# Patient Record
Sex: Female | Born: 1950
Health system: Southern US, Community
[De-identification: ages and names within clinical notes are randomized; demographics above are authoritative.]

## PROBLEM LIST (undated history)

## (undated) DIAGNOSIS — F22 Delusional disorders: Secondary | ICD-10-CM

## (undated) DIAGNOSIS — R112 Nausea with vomiting, unspecified: Secondary | ICD-10-CM

## (undated) DIAGNOSIS — T8859XA Other complications of anesthesia, initial encounter: Secondary | ICD-10-CM

## (undated) DIAGNOSIS — K219 Gastro-esophageal reflux disease without esophagitis: Secondary | ICD-10-CM

## (undated) DIAGNOSIS — R7303 Prediabetes: Secondary | ICD-10-CM

## (undated) DIAGNOSIS — E669 Obesity, unspecified: Secondary | ICD-10-CM

## (undated) DIAGNOSIS — C50919 Malignant neoplasm of unspecified site of unspecified female breast: Secondary | ICD-10-CM

## (undated) DIAGNOSIS — Z86718 Personal history of other venous thrombosis and embolism: Secondary | ICD-10-CM

## (undated) DIAGNOSIS — N200 Calculus of kidney: Secondary | ICD-10-CM

## (undated) DIAGNOSIS — I82409 Acute embolism and thrombosis of unspecified deep veins of unspecified lower extremity: Secondary | ICD-10-CM

## (undated) DIAGNOSIS — E079 Disorder of thyroid, unspecified: Secondary | ICD-10-CM

## (undated) DIAGNOSIS — T4145XA Adverse effect of unspecified anesthetic, initial encounter: Secondary | ICD-10-CM

## (undated) DIAGNOSIS — I10 Essential (primary) hypertension: Secondary | ICD-10-CM

## (undated) DIAGNOSIS — Z9889 Other specified postprocedural states: Secondary | ICD-10-CM

## (undated) DIAGNOSIS — E039 Hypothyroidism, unspecified: Secondary | ICD-10-CM

## (undated) DIAGNOSIS — Z923 Personal history of irradiation: Secondary | ICD-10-CM

## (undated) DIAGNOSIS — Z87442 Personal history of urinary calculi: Secondary | ICD-10-CM

## (undated) DIAGNOSIS — E7800 Pure hypercholesterolemia, unspecified: Secondary | ICD-10-CM

## (undated) HISTORY — PX: IVC FILTER INSERTION: CATH118245

## (undated) HISTORY — PX: COLONOSCOPY: SHX174

## (undated) HISTORY — DX: Personal history of other venous thrombosis and embolism: Z86.718

## (undated) HISTORY — PX: ABDOMINAL HYSTERECTOMY: SHX81

## (undated) HISTORY — PX: APPENDECTOMY: SHX54

## (undated) HISTORY — PX: TONSILLECTOMY: SUR1361

---

## 1988-11-24 HISTORY — PX: BREAST CYST ASPIRATION: SHX578

## 2004-11-21 ENCOUNTER — Ambulatory Visit: Payer: Self-pay | Admitting: Internal Medicine

## 2004-11-28 ENCOUNTER — Ambulatory Visit: Payer: Self-pay | Admitting: Internal Medicine

## 2006-02-12 ENCOUNTER — Ambulatory Visit: Payer: Self-pay | Admitting: Internal Medicine

## 2006-07-20 ENCOUNTER — Ambulatory Visit: Payer: Self-pay | Admitting: Gynecology

## 2007-03-04 ENCOUNTER — Ambulatory Visit: Payer: Self-pay | Admitting: Internal Medicine

## 2007-03-05 ENCOUNTER — Ambulatory Visit: Payer: Self-pay | Admitting: Internal Medicine

## 2007-06-09 ENCOUNTER — Ambulatory Visit: Payer: Self-pay | Admitting: Unknown Physician Specialty

## 2007-06-17 ENCOUNTER — Ambulatory Visit: Payer: Self-pay | Admitting: Unknown Physician Specialty

## 2007-09-07 ENCOUNTER — Ambulatory Visit: Payer: Self-pay | Admitting: Internal Medicine

## 2007-10-27 ENCOUNTER — Ambulatory Visit: Payer: Self-pay | Admitting: Unknown Physician Specialty

## 2007-11-02 ENCOUNTER — Inpatient Hospital Stay: Payer: Self-pay | Admitting: Unknown Physician Specialty

## 2008-03-06 ENCOUNTER — Ambulatory Visit: Payer: Self-pay | Admitting: Internal Medicine

## 2008-10-02 ENCOUNTER — Ambulatory Visit: Payer: Self-pay | Admitting: Unknown Physician Specialty

## 2008-10-10 ENCOUNTER — Ambulatory Visit: Payer: Self-pay | Admitting: Unknown Physician Specialty

## 2009-03-07 ENCOUNTER — Ambulatory Visit: Payer: Self-pay | Admitting: Internal Medicine

## 2009-12-25 ENCOUNTER — Ambulatory Visit: Payer: Self-pay | Admitting: Urology

## 2010-03-08 ENCOUNTER — Ambulatory Visit: Payer: Self-pay | Admitting: Internal Medicine

## 2010-11-20 ENCOUNTER — Ambulatory Visit: Payer: Self-pay | Admitting: Urology

## 2011-04-09 ENCOUNTER — Ambulatory Visit: Payer: Self-pay | Admitting: Internal Medicine

## 2011-09-03 ENCOUNTER — Ambulatory Visit: Payer: Self-pay | Admitting: Urology

## 2012-03-08 ENCOUNTER — Ambulatory Visit: Payer: Self-pay | Admitting: Urology

## 2012-03-30 ENCOUNTER — Ambulatory Visit: Payer: Self-pay | Admitting: Orthopedic Surgery

## 2012-11-01 ENCOUNTER — Ambulatory Visit: Payer: Self-pay | Admitting: Internal Medicine

## 2012-11-04 ENCOUNTER — Ambulatory Visit: Payer: Self-pay | Admitting: Unknown Physician Specialty

## 2013-11-02 ENCOUNTER — Ambulatory Visit: Payer: Self-pay | Admitting: Internal Medicine

## 2015-03-18 NOTE — Op Note (Signed)
PATIENT NAME:  Susan, Myers MR#:  [id number] DATE OF BIRTH:  Apr 25, 1951  DATE OF PROCEDURE:  03/30/2012  PREOPERATIVE DIAGNOSIS: Left thumb CMC osteoarthritis.   POSTOPERATIVE DIAGNOSIS: Left thumb CMC osteoarthritis.   PROCEDURE: CMC arthroplasty, left thumb, with interposition tendon arthroplasty.   ANESTHESIA: General.   SURGEON: Laurene Footman, MD    DESCRIPTION OF PROCEDURE: The patient was brought to the operating room and after adequate anesthesia was obtained, the left arm was prepped and draped in the usual sterile fashion with a tourniquet applied to the upper arm. After patient identification and time-out procedures were completed, the arm was exsanguinated with an Esmarch and the tourniquet raised to 250 mmHg. Curvilinear incision was made at the base of the thumb in between the palmar and dorsal skin. The subcutaneous tissues were spread preserving cutaneous nerves. The [facility] joint was identified and incised. There was a large gush of fluid. There was synovitis present. After elevating the capsule for subsequent repair, a small saw was used to split the trapezoid into and the trapezoid was removed in fragments with significant osteoarthritis noted to both the base of the thumb and the trapezoid. The mini C-arm was used to assess and make sure that the entire trapezoid was removed. After completing this, drill hole was made in the base of the first metacarpal into the joint on the volar surface for subsequent ligament reconstruction. An incision was made approximately three inches proximal to the wrist and the FCR tendon was identified and cut and released. It was pulled up through the wound distally in the volar aspect of the wrist and split with one-half being rolled up as an anchovy interposition graft and placed deep into the space where the trapezium had been. The second arm of the tendon was placed through the bony tunnel and with the finger in an abducted position sutured to  itself twice to help reconstruct the ligament stability to prevent subluxation. The capsule was then repaired over the interposed tendon and the abductor tendon was shortened slightly to increase the abductor pole. The wound was then thoroughly irrigated and closed with 5-0 nylon in a simple interrupted fashion. 20 mL of 0.5% Sensorcaine without epinephrine were infiltrated. Sterile dressing of Xeroform, 4 x 4's, Webril, and a radial gutter splint were applied. The patient was sent to the recovery room in stable condition after letting the tourniquet down. Tourniquet time was 82 minutes at 250 mmHg.   SPECIMEN: Removed bone of the trapezium.    ____________________________ Laurene Footman, MD mjm:drc D: 03/30/2012 19:22:11 ET T: 03/31/2012 11:01:48 ET JOB#: [id number]  cc: Laurene Footman, MD, <Dictator> Laurene Footman MD ELECTRONICALLY SIGNED 03/31/2012 12:08

## 2015-04-06 ENCOUNTER — Ambulatory Visit
Admission: RE | Admit: 2015-04-06 | Discharge: 2015-04-06 | Disposition: A | Discharge status: Home / Self Care | Payer: No Typology Code available for payment source | Source: Ambulatory Visit | Attending: Internal Medicine | Admitting: Internal Medicine

## 2015-04-06 ENCOUNTER — Other Ambulatory Visit: Payer: Self-pay | Admitting: Internal Medicine

## 2015-04-06 DIAGNOSIS — Z1231 Encounter for screening mammogram for malignant neoplasm of breast: Secondary | ICD-10-CM | POA: Diagnosis present

## 2015-04-10 ENCOUNTER — Other Ambulatory Visit: Payer: Self-pay | Admitting: Internal Medicine

## 2015-04-10 DIAGNOSIS — N63 Unspecified lump in unspecified breast: Secondary | ICD-10-CM

## 2015-04-10 DIAGNOSIS — R928 Other abnormal and inconclusive findings on diagnostic imaging of breast: Secondary | ICD-10-CM

## 2015-04-11 ENCOUNTER — Ambulatory Visit
Admission: RE | Admit: 2015-04-11 | Discharge: 2015-04-11 | Disposition: A | Discharge status: Home / Self Care | Payer: No Typology Code available for payment source | Source: Ambulatory Visit | Attending: Internal Medicine | Admitting: Internal Medicine

## 2015-04-11 DIAGNOSIS — N63 Unspecified lump in unspecified breast: Secondary | ICD-10-CM

## 2015-04-11 DIAGNOSIS — R928 Other abnormal and inconclusive findings on diagnostic imaging of breast: Secondary | ICD-10-CM

## 2015-10-09 ENCOUNTER — Other Ambulatory Visit: Payer: Self-pay | Admitting: Internal Medicine

## 2015-10-09 DIAGNOSIS — R928 Other abnormal and inconclusive findings on diagnostic imaging of breast: Secondary | ICD-10-CM

## 2015-10-23 ENCOUNTER — Ambulatory Visit: Admission: RE | Admit: 2015-10-23 | Payer: No Typology Code available for payment source | Source: Ambulatory Visit

## 2015-11-23 ENCOUNTER — Encounter: Payer: Self-pay | Admitting: Emergency Medicine

## 2015-11-23 ENCOUNTER — Emergency Department
Admission: EM | Admit: 2015-11-23 | Discharge: 2015-11-24 | Disposition: A | Discharge status: Other Acute Inpatient Hospital | Payer: PPO | Attending: Emergency Medicine | Admitting: Emergency Medicine

## 2015-11-23 DIAGNOSIS — F29 Unspecified psychosis not due to a substance or known physiological condition: Secondary | ICD-10-CM | POA: Insufficient documentation

## 2015-11-23 DIAGNOSIS — F22 Delusional disorders: Secondary | ICD-10-CM | POA: Diagnosis not present

## 2015-11-23 DIAGNOSIS — Z880 Allergy status to penicillin: Secondary | ICD-10-CM | POA: Diagnosis not present

## 2015-11-23 DIAGNOSIS — E039 Hypothyroidism, unspecified: Secondary | ICD-10-CM

## 2015-11-23 DIAGNOSIS — Z046 Encounter for general psychiatric examination, requested by authority: Secondary | ICD-10-CM | POA: Diagnosis present

## 2015-11-23 HISTORY — DX: Calculus of kidney: N20.0

## 2015-11-23 HISTORY — DX: Disorder of thyroid, unspecified: E07.9

## 2015-11-23 LAB — COMPREHENSIVE METABOLIC PANEL
ALBUMIN: 4.8 g/dL (ref 3.5–5.0)
ALK PHOS: 88 U/L (ref 38–126)
ALT: 24 U/L (ref 14–54)
ANION GAP: 10 (ref 5–15)
AST: 22 U/L (ref 15–41)
BUN: 20 mg/dL (ref 6–20)
CO2: 28 mmol/L (ref 22–32)
Calcium: 9.7 mg/dL (ref 8.9–10.3)
Chloride: 105 mmol/L (ref 101–111)
Creatinine, Ser: 0.92 mg/dL (ref 0.44–1.00)
GFR calc Af Amer: >60 mL/min (ref >60)
GFR calc non Af Amer: >60 mL/min (ref >60)
GLUCOSE: 101 mg/dL — AB (ref 65–99)
POTASSIUM: 3.4 mmol/L — AB (ref 3.5–5.1)
SODIUM: 143 mmol/L (ref 135–145)
Total Bilirubin: 0.8 mg/dL (ref 0.3–1.2)
Total Protein: 7.5 g/dL (ref 6.5–8.1)

## 2015-11-23 LAB — CBC
HCT: 44.4 % (ref 35.0–47.0)
Hemoglobin: 14.6 g/dL (ref 12.0–16.0)
MCH: 30.7 pg (ref 26.0–34.0)
MCHC: 32.9 g/dL (ref 32.0–36.0)
MCV: 93.2 fL (ref 80.0–100.0)
PLATELETS: 214 10*3/uL (ref 150–440)
RBC: 4.77 MIL/uL (ref 3.80–5.20)
RDW: 13.8 % (ref 11.5–14.5)
WBC: 12.3 10*3/uL — ABNORMAL HIGH (ref 3.6–11.0)

## 2015-11-23 LAB — URINE DRUG SCREEN, QUALITATIVE (ARMC ONLY)
Amphetamines, Ur Screen: NOT DETECTED
BARBITURATES, UR SCREEN: NOT DETECTED
Benzodiazepine, Ur Scrn: NOT DETECTED
CANNABINOID 50 NG, UR ~~LOC~~: NOT DETECTED
COCAINE METABOLITE, UR ~~LOC~~: NOT DETECTED
MDMA (ECSTASY) UR SCREEN: NOT DETECTED
Methadone Scn, Ur: NOT DETECTED
Opiate, Ur Screen: NOT DETECTED
PHENCYCLIDINE (PCP) UR S: NOT DETECTED
TRICYCLIC, UR SCREEN: NOT DETECTED

## 2015-11-23 LAB — SALICYLATE LEVEL: Salicylate Lvl: <4 mg/dL (ref 2.8–30.0)

## 2015-11-23 LAB — ETHANOL: Alcohol, Ethyl (B): <5 mg/dL (ref <5)

## 2015-11-23 LAB — ACETAMINOPHEN LEVEL

## 2015-11-23 NOTE — ED Notes (Signed)
Report received from Luis RN

## 2015-11-23 NOTE — ED Notes (Addendum)
Patient to ED-BHU from ED ambulatory without difficulty to room #3.  Patient is alert and oriented, calm and cooperative, and in no apparent distress at the current time.  Patient denies any pain currently.  Patient denies suicidal ideation, homicidal ideation, auditory or visual hallucinations at the present time.  Patient is oriented to the unit.  Patient is made aware of security cameras and q.15 minute safety checks.  Patient is encouraged to notify staff with any questions or concerns.
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
## 2015-11-23 NOTE — ED Notes (Signed)
ED BHU [place] Is the patient under IVC or is there intent for IVC: Yes.   Is the patient medically cleared: Yes.   Is there vacancy in the ED BHU: Yes.   Is the population mix appropriate for patient: Yes.   Is the patient awaiting placement in inpatient or outpatient setting: Yes.   Has the patient had a psychiatric consult: Yes.   Survey of unit performed for contraband, proper placement and condition of furniture, tampering with fixtures in bathroom, shower, and each patient room: Yes.  ; Findings: All clear. APPEARANCE/BEHAVIOR calm, cooperative and adequate rapport can be established NEURO ASSESSMENT Orientation: time, place and person Hallucinations: No.None noted (Hallucinations) Speech: Normal Gait: normal RESPIRATORY ASSESSMENT Within normal limits. CARDIOVASCULAR ASSESSMENT Within normal limits GASTROINTESTINAL ASSESSMENT Within normal limits EXTREMITIES ROM of all joints is normal PLAN OF CARE Provide calm/safe environment. Vital signs assessed twice daily. ED BHU Assessment once each 12-hour shift. Collaborate with intake RN daily or as condition indicates. Assure the ED provider has rounded once each shift. Provide and encourage hygiene. Provide redirection as needed. Assess for escalating behavior; address immediately and inform ED provider.  Assess family dynamic and appropriateness for visitation as needed: Yes.  ; If necessary, describe findings:  Educate the patient/family about BHU procedures/visitation: Yes.  ; If necessary, describe findings: Patient calm and cooperative at this time, understanding of BHU rules and procedures.  Will continue to monitor.

## 2015-11-23 NOTE — ED Notes (Signed)
Hand off report given to Jen, RN

## 2015-11-24 ENCOUNTER — Encounter: Payer: Self-pay | Admitting: General Practice

## 2015-11-24 ENCOUNTER — Inpatient Hospital Stay
Admission: EM | Admit: 2015-11-24 | Discharge: 2015-11-29 | DRG: 885 | Disposition: A | Discharge status: Home / Self Care | Payer: PPO | Source: Intra-hospital | Attending: Psychiatry | Admitting: Psychiatry

## 2015-11-24 DIAGNOSIS — R45851 Suicidal ideations: Secondary | ICD-10-CM | POA: Diagnosis present

## 2015-11-24 DIAGNOSIS — G4700 Insomnia, unspecified: Secondary | ICD-10-CM | POA: Diagnosis present

## 2015-11-24 DIAGNOSIS — F22 Delusional disorders: Principal | ICD-10-CM | POA: Diagnosis present

## 2015-11-24 DIAGNOSIS — F29 Unspecified psychosis not due to a substance or known physiological condition: Secondary | ICD-10-CM | POA: Diagnosis not present

## 2015-11-24 DIAGNOSIS — R609 Edema, unspecified: Secondary | ICD-10-CM | POA: Diagnosis present

## 2015-11-24 DIAGNOSIS — Z882 Allergy status to sulfonamides status: Secondary | ICD-10-CM

## 2015-11-24 DIAGNOSIS — E039 Hypothyroidism, unspecified: Secondary | ICD-10-CM | POA: Diagnosis present

## 2015-11-24 DIAGNOSIS — N390 Urinary tract infection, site not specified: Secondary | ICD-10-CM | POA: Diagnosis present

## 2015-11-24 DIAGNOSIS — Z888 Allergy status to other drugs, medicaments and biological substances status: Secondary | ICD-10-CM

## 2015-11-24 DIAGNOSIS — Z87442 Personal history of urinary calculi: Secondary | ICD-10-CM | POA: Diagnosis not present

## 2015-11-24 DIAGNOSIS — Z880 Allergy status to penicillin: Secondary | ICD-10-CM | POA: Diagnosis not present

## 2015-11-24 DIAGNOSIS — I679 Cerebrovascular disease, unspecified: Secondary | ICD-10-CM

## 2015-11-24 DIAGNOSIS — E7800 Pure hypercholesterolemia, unspecified: Secondary | ICD-10-CM

## 2015-11-24 DIAGNOSIS — I10 Essential (primary) hypertension: Secondary | ICD-10-CM

## 2015-11-24 LAB — LIPID PANEL
Cholesterol: 204 mg/dL — ABNORMAL HIGH (ref 0–200)
HDL: 74 mg/dL (ref >40)
LDL CALC: 113 mg/dL — AB (ref 0–99)
Total CHOL/HDL Ratio: 2.8 RATIO
Triglycerides: 83 mg/dL (ref <150)
VLDL: 17 mg/dL (ref 0–40)

## 2015-11-24 LAB — TSH: TSH: 4.835 u[IU]/mL — AB (ref 0.350–4.500)

## 2015-11-24 MED ORDER — ARIPIPRAZOLE 2 MG PO TABS: 2.0000 mg | ORAL_TABLET | Freq: Every day | ORAL | Status: DC

## 2015-11-24 MED ORDER — MAGNESIUM HYDROXIDE 400 MG/5ML PO SUSP
30.0000 mL | Freq: Every day | ORAL | Status: DC | PRN
  Administered 2015-11-24 – 2015-11-26 (×2): 30 mL via ORAL
  Filled 2015-11-24 (×2): qty 30

## 2015-11-24 MED ORDER — ALUM & MAG HYDROXIDE-SIMETH 200-200-20 MG/5ML PO SUSP
30.0000 mL | ORAL | Status: DC | PRN
  Administered 2015-11-27 – 2015-11-28 (×2): 30 mL via ORAL
  Filled 2015-11-24 (×2): qty 30

## 2015-11-24 MED ORDER — LEVOTHYROXINE SODIUM 25 MCG PO TABS
50.0000 ug | ORAL_TABLET | Freq: Every day | ORAL | Status: DC
  Administered 2015-11-25 – 2015-11-26 (×2): 50 ug via ORAL
  Filled 2015-11-24 (×2): qty 2

## 2015-11-24 MED ORDER — IBUPROFEN 600 MG PO TABS
600.0000 mg | ORAL_TABLET | Freq: Once | ORAL | Status: DC
  Filled 2015-11-24 (×2): qty 1

## 2015-11-24 MED ORDER — IBUPROFEN 600 MG PO TABS
600.0000 mg | ORAL_TABLET | Freq: Once | ORAL | Status: AC
  Administered 2015-11-24: 600 mg via ORAL
  Filled 2015-11-24: qty 1

## 2015-11-24 MED ORDER — ACETAMINOPHEN 325 MG PO TABS
650.0000 mg | ORAL_TABLET | Freq: Four times a day (QID) | ORAL | Status: DC | PRN
  Administered 2015-11-25 – 2015-11-26 (×2): 650 mg via ORAL
  Filled 2015-11-24 (×2): qty 2

## 2015-11-24 MED ORDER — ARIPIPRAZOLE 2 MG PO TABS
2.0000 mg | ORAL_TABLET | Freq: Every day | ORAL | Status: DC
  Filled 2015-11-24: qty 1

## 2015-11-24 MED ORDER — LEVOTHYROXINE SODIUM 100 MCG PO TABS: 50.0000 ug | ORAL_TABLET | Freq: Every day | ORAL | Status: DC

## 2015-11-24 NOTE — Consult Note (Signed)
[place] Psychiatry Consult   Reason for Consult:  Consult for this 64 year old woman with no known past psychiatric history brought in on involuntary commitment filed by her husband alleging psychosis Referring Physician:  Joni Fears Patient Identification: PATRISHA HAUSMANN MRN:  [id number] Principal Diagnosis: Delusional disorder [facility]) Diagnosis:   Patient Active Problem List   Diagnosis Date Noted  . Delusional disorder ([place]) [F22] 11/24/2015  . Suicidal ideation [R45.851] 11/24/2015  . Hypothyroid [E03.9] 11/24/2015    Total Time spent with patient: 1 hour  Subjective:   DEVANEY SEGERS is a 64 y.o. female patient admitted with "my husband is the one you need to be talking too".  HPI:  Patient was interviewed. Chart reviewed. What little old chart there is was reviewed as well. Labs reviewed. Commitment paperwork reviewed. Patient was brought in on involuntary commitment papers taken out by her husband. Involuntary commitment alleges that the patient has been delusional and confused in her thinking and that she has made statements about shooting herself or wanting to know how many pills it would take to kill oneself. The patient tells a very different story. She says that the problem is that her husband has been having extramarital affairs for over 13 years. She says that she knows this because of a pattern he has of acting in a secretive manner. She is not able to actually provide anything that sounds like concrete proof of this. She says that she has become increasingly agitated about it recently and has been confronting her husband. She claims that when she confronts him about it he becomes angry and shouts at her. The patient however says that her mood does not feel depressed. She says that she sleeps but she only probably sleeps about 5 hours a night however she thinks that's chronic. Her appetite is been normal. Patient denies that she has auditory hallucinations. She has a  large an elaborate set of beliefs about her husband's behavior. She is unwilling to even consider the possibility that she could be mistaken about any of it. Patient denies that she actually wants to kill her self. Admits that she made a comment about shooting herself in the head but she says that was weeks ago. The paperwork indicates that it was much more recent than that. Patient admits that she ask how many Tylenol and would take to kill her but says that she was just doing that to bother her husband. Patient says she takes medicine for her hypothyroidism and some chronic edema in her legs. Denies using drugs or alcohol.  Social history: Patient has been married for over 40 years. Has 2 adult sons. Both the patient and her husband sound like there at least partially retired. The patient claims that her husband has extramarital affairs and is constantly on the Internet doing things behind her back that she can't catch him at except that she sees him being sneaky.  Medical history: Patient has hypothyroidism. Takes Synthroid. Says she has some chronic edema in her legs also uses Zocor for dyslipidemia. The only records we have in her past history here at the hospital were surgery on her thumb for arthritis.  Substance abuse history: Says she drinks alcohol very rarely. Denies any history of drug or alcohol abuse.  Past Psychiatric History: Patient claims to have no past psychiatric history at all. Has never seen a psychiatrist or therapist. Never been in a psychiatric hospital. No history of suicide attempts. Never been prescribed any psychiatric medicine  Risk to  Self: Suicidal Ideation: No Suicidal Intent: No Is patient at risk for suicide?: No Suicidal Plan?: No Access to Means: No What has been your use of drugs/alcohol within the last 12 months?: None Reported Other Self Harm Risks: None Noted Intentional Self Injurious Behavior: None Risk to Others: Homicidal Ideation: No Thoughts of Harm  to Others: No Current Homicidal Intent: No Current Homicidal Plan: No Access to Homicidal Means: No Identified Victim: NA History of harm to others?: No Assessment of Violence: None Noted Violent Behavior Description: NA Does patient have access to weapons?: No Criminal Charges Pending?: No Does patient have a court date: No Prior Inpatient Therapy: Prior Inpatient Therapy: No Prior Outpatient Therapy: Prior Outpatient Therapy: No Does patient have an ACCT team?: No Does patient have Intensive In-House Services?  : No Does patient have [facility] services? : No Does patient have P4CC services?: No  Past Medical History:  Past Medical History  Diagnosis Date  . Kidney stones   . Thyroid disease    History reviewed. No pertinent past surgical history. Family History: History reviewed. No pertinent family history. Family Psychiatric  History: Patient denies there being any family history of mental illness or substance abuse problems at all Social History:  History  Alcohol Use No     History  Drug Use No    Social History   Social History  . Marital Status: Married    Spouse Name: N/A  . Number of Children: N/A  . Years of Education: N/A   Social History Main Topics  . Smoking status: Never Smoker   . Smokeless tobacco: Never Used  . Alcohol Use: No  . Drug Use: No  . Sexual Activity: Not Asked   Other Topics Concern  . None   Social History Narrative   Additional Social History:    Pain Medications: None Reported Prescriptions: None Reported Over the Counter: None Reported History of alcohol / drug use?: No history of alcohol / drug abuse                     Allergies:   Allergies  Allergen Reactions  . Amoxicillin Hives  . Levaquin [Levofloxacin In D5w] Hives  . Sulfa Antibiotics Hives    Labs:  Results for orders placed or performed during the hospital encounter of 11/23/15 (from the past 48 hour(s))  Comprehensive metabolic panel      Status: Abnormal   Collection Time: 11/23/15  9:03 PM  Result Value Ref Range   Sodium 143 135 - 145 mmol/L   Potassium 3.4 (L) 3.5 - 5.1 mmol/L   Chloride 105 101 - 111 mmol/L   CO2 28 22 - 32 mmol/L   Glucose, Bld 101 (H) 65 - 99 mg/dL   BUN 20 6 - 20 mg/dL   Creatinine, Ser 0.92 0.44 - 1.00 mg/dL   Calcium 9.7 8.9 - 10.3 mg/dL   Total Protein 7.5 6.5 - 8.1 g/dL   Albumin 4.8 3.5 - 5.0 g/dL   AST 22 15 - 41 U/L   ALT 24 14 - 54 U/L   Alkaline Phosphatase 88 38 - 126 U/L   Total Bilirubin 0.8 0.3 - 1.2 mg/dL   GFR calc non Af Amer >60 >60 mL/min   GFR calc Af Amer >60 >60 mL/min    Comment: (NOTE) The eGFR has been calculated using the CKD EPI equation. This calculation has not been validated in all clinical situations. eGFR's persistently <60 mL/min signify possible Chronic Kidney Disease.  Anion gap 10 5 - 15  Ethanol (ETOH)     Status: None   Collection Time: 11/23/15  9:03 PM  Result Value Ref Range   Alcohol, Ethyl (B) <5 <5 mg/dL    Comment:        LOWEST DETECTABLE LIMIT FOR SERUM ALCOHOL IS 5 mg/dL FOR MEDICAL PURPOSES ONLY   Salicylate level     Status: None   Collection Time: 11/23/15  9:03 PM  Result Value Ref Range   Salicylate Lvl <1.4 2.8 - 30.0 mg/dL  Acetaminophen level     Status: Abnormal   Collection Time: 11/23/15  9:03 PM  Result Value Ref Range   Acetaminophen (Tylenol), Serum <10 (L) 10 - 30 ug/mL    Comment:        THERAPEUTIC CONCENTRATIONS VARY SIGNIFICANTLY. A RANGE OF 10-30 ug/mL MAY BE AN EFFECTIVE CONCENTRATION FOR MANY PATIENTS. HOWEVER, SOME ARE BEST TREATED AT CONCENTRATIONS OUTSIDE THIS RANGE. ACETAMINOPHEN CONCENTRATIONS >150 ug/mL AT 4 HOURS AFTER INGESTION AND >50 ug/mL AT 12 HOURS AFTER INGESTION ARE OFTEN ASSOCIATED WITH TOXIC REACTIONS.   CBC     Status: Abnormal   Collection Time: 11/23/15  9:03 PM  Result Value Ref Range   WBC 12.3 (H) 3.6 - 11.0 K/uL   RBC 4.77 3.80 - 5.20 MIL/uL   Hemoglobin 14.6 12.0 - 16.0  g/dL   HCT 44.4 35.0 - 47.0 %   MCV 93.2 80.0 - 100.0 fL   MCH 30.7 26.0 - 34.0 pg   MCHC 32.9 32.0 - 36.0 g/dL   RDW 13.8 11.5 - 14.5 %   Platelets 214 150 - 440 K/uL  Urine Drug Screen, Qualitative ([facility] only)     Status: None   Collection Time: 11/23/15 10:00 PM  Result Value Ref Range   Tricyclic, Ur Screen NONE DETECTED NONE DETECTED   Amphetamines, Ur Screen NONE DETECTED NONE DETECTED   MDMA (Ecstasy)Ur Screen NONE DETECTED NONE DETECTED   Cocaine Metabolite,Ur [place] NONE DETECTED NONE DETECTED   Opiate, Ur Screen NONE DETECTED NONE DETECTED   Phencyclidine (PCP) Ur S NONE DETECTED NONE DETECTED   Cannabinoid 50 Ng, Ur [place] NONE DETECTED NONE DETECTED   Barbiturates, Ur Screen NONE DETECTED NONE DETECTED   Benzodiazepine, Ur Scrn NONE DETECTED NONE DETECTED   Methadone Scn, Ur NONE DETECTED NONE DETECTED    Comment: (NOTE) 782  Tricyclics, urine               Cutoff 1000 ng/mL 200  Amphetamines, urine             Cutoff 1000 ng/mL 300  MDMA (Ecstasy), urine           Cutoff 500 ng/mL 400  Cocaine Metabolite, urine       Cutoff 300 ng/mL 500  Opiate, urine                   Cutoff 300 ng/mL 600  Phencyclidine (PCP), urine      Cutoff 25 ng/mL 700  Cannabinoid, urine              Cutoff 50 ng/mL 800  Barbiturates, urine             Cutoff 200 ng/mL 900  Benzodiazepine, urine           Cutoff 200 ng/mL 1000 Methadone, urine                Cutoff 300 ng/mL 1100 1200 The urine drug screen provides only  a preliminary, unconfirmed 1300 analytical test result and should not be used for non-medical 1400 purposes. Clinical consideration and professional judgment should 1500 be applied to any positive drug screen result due to possible 1600 interfering substances. A more specific alternate chemical method 1700 must be used in order to obtain a confirmed analytical result.  1800 Gas chromato graphy / mass spectrometry (GC/MS) is the preferred 1900 confirmatory method.     No current  facility-administered medications for this encounter.   No current outpatient prescriptions on file.    Musculoskeletal: Strength & Muscle Tone: within normal limits Gait & Station: normal Patient leans: N/A  Psychiatric Specialty Exam: Review of Systems  Constitutional: Negative.   HENT: Negative.   Eyes: Negative.   Respiratory: Negative.   Cardiovascular: Negative.   Gastrointestinal: Negative.   Musculoskeletal: Negative.   Skin: Negative.   Neurological: Negative.   Psychiatric/Behavioral: Negative for depression, suicidal ideas, hallucinations, memory loss and substance abuse. The patient is nervous/anxious and has insomnia.     Blood pressure 117/81, pulse 73, temperature 98.1 F (36.7 C), temperature source Oral, resp. rate 20, height '5\' 2"'$  (1.575 m), weight 83.462 kg (184 lb), SpO2 100 %.Body mass index is 33.65 kg/(m^2).  General Appearance: Fairly Groomed  Engineer, water::  Good  Speech:  Normal Rate  Volume:  Normal  Mood:  Euthymic  Affect:  Full Range  Thought Process:  Tangential  Orientation:  Full (Time, Place, and Person)  Thought Content:  Delusions  Suicidal Thoughts:  Yes.  without intent/plan  Homicidal Thoughts:  No  Memory:  Immediate;   Good Recent;   Good Remote;   Good  Judgement:  Impaired  Insight:  Lacking  Psychomotor Activity:  Normal  Concentration:  Fair  Recall:  [place] of Knowledge:Good  Language: Good  Akathisia:  No  Handed:  Right  AIMS (if indicated):     Assets:  Agricultural consultant Housing Physical Health Resilience  ADL's:  Intact  Cognition: WNL  Sleep:      Treatment Plan Summary: Daily contact with patient to assess and evaluate symptoms and progress in treatment, Medication management and Plan Patient is a 64 year old woman without a known past psychiatric history. It is alleged by the commitment paperwork that she has been agitated and also having euphoric mood and confused  thinking and made suicidal statements. During my interview the patient evidences an elaborate set of beliefs about her husband's infidelity. On the face of it what she says is not impossible and I was unsure whether to consider it a delusion although then I asked her whether it was possible that any sedative fax could disprove her believe. When she told me that that was impossible because it was definitely the case that her husband was having an affair even though she really doesn't have any proof of it I decided to call this a delusional disorder. Bipolar mania would also be in the differential diagnosis. Labs and vitals and gross physical exam do not suggest any medical cause that is likely behind this. No evidence of substance abuse. Patient will be admitted to the psychiatry ward. Low dose of Abilify initiated. Continue Synthroid. Patient still is somewhat concerning for her suicidal ideation and the 15 minute checks will be continued on admission.  Disposition: Recommend psychiatric Inpatient admission when medically cleared. Supportive therapy provided about ongoing stressors.  John Clapacs 11/24/2015 4:55 PM

## 2015-11-24 NOTE — ED Notes (Signed)
Given dinner

## 2015-11-24 NOTE — Progress Notes (Signed)
   11/24/15 0950  Clinical Encounter Type  Visited With Patient  Visit Type Initial;Psychological support;Spiritual support;Behavioral Health  Referral From Nurse  Consult/Referral To Chaplain  Spiritual Encounters  Spiritual Needs Emotional;Prayer;Sacred text  Stress Factors  Patient Stress Factors Family relationships  Met w/patient who expressed distrust of spouse. Provided pastoral counsel and prayer. Chap. [place] [facility] [place]

## 2015-11-24 NOTE — ED Notes (Signed)
pts husband visited and pt was just concerned that her husband didn't love her or want the marriage he kept on reassuring her that he was. He mentioned to me that in the past few weeks her bizzare behavior has increased to the point her family was concerned about her behavior and they consulted her medical dr which he informed then she needed to come to er asap.

## 2015-11-24 NOTE — ED Notes (Signed)
Pt. Noted in room. No complaints or concerns voiced. No distress or abnormal behavior noted. Will continue to monitor with security cameras. Q 15 minute rounds continue. 

## 2015-11-24 NOTE — ED Provider Notes (Signed)
-----------------------------------------   7:21 AM on 11/24/2015 -----------------------------------------   Blood pressure 149/99, pulse 89, resp. rate 16, height 5\' 2"  (1.575 m), weight 184 lb (83.462 kg), SpO2 98 %.  The patient had no acute events since last update.  Calm and cooperative at this time.  Disposition is pending per Psychiatry/Behavioral Medicine team recommendations.     Daymon Larsen, MD 11/24/15 [phone]

## 2015-11-24 NOTE — ED Notes (Signed)
Asked for supplies for shower

## 2015-11-24 NOTE — ED Notes (Signed)
Pt. Noted in room. No complaints or concerns voiced. No distress or abnormal behavior noted. Will continue to monitor with security cameras. Q 15 minute rounds continue.pt aware she is being adm she is agreeable

## 2015-11-24 NOTE — ED Notes (Signed)

## 2015-11-24 NOTE — ED Notes (Signed)
Pt up to restroom to urinate, then back to bed.

## 2015-11-24 NOTE — ED Notes (Signed)
Pt very pleasant and cooperative.she states that years ago her husband went on a cruise and started an affair and she thinks that affair has restarted and she caught him communicating on computer and cell phone and he called police after an argument and said she was crazy, she plans on staying with her mom until this is resolved

## 2015-11-24 NOTE — ED Notes (Signed)
Pt adm [facility]c to unit transport with police

## 2015-11-24 NOTE — BHH Counselor (Signed)
Pt to be admitted to [facility] Unit per Dr. Weber Cooks. This Probation officer informed charge nurse Ozzie Hoyle, RN that pt is to be admitted per Dr. Weber Cooks. Faxed information to [employer] per request.

## 2015-11-24 NOTE — ED Notes (Signed)
Pt up to restroom and back to bed.

## 2015-11-24 NOTE — ED Notes (Signed)
Dr Weber Cooks here

## 2015-11-25 ENCOUNTER — Encounter: Payer: Self-pay | Admitting: Psychiatry

## 2015-11-25 DIAGNOSIS — F22 Delusional disorders: Principal | ICD-10-CM

## 2015-11-25 LAB — HEMOGLOBIN A1C: Hgb A1c MFr Bld: 6 % (ref 4.0–6.0)

## 2015-11-25 MED ORDER — ARIPIPRAZOLE 10 MG PO TABS: 5.0000 mg | ORAL_TABLET | Freq: Every day | ORAL | Status: DC

## 2015-11-25 MED ORDER — DIVALPROEX SODIUM 500 MG PO DR TAB
500.0000 mg | DELAYED_RELEASE_TABLET | Freq: Three times a day (TID) | ORAL | Status: DC
  Administered 2015-11-25 – 2015-11-26 (×2): 500 mg via ORAL
  Filled 2015-11-25 (×2): qty 1

## 2015-11-25 MED ORDER — DIPHENHYDRAMINE HCL 25 MG PO CAPS
50.0000 mg | ORAL_CAPSULE | Freq: Once | ORAL | Status: DC
Start: 2015-11-25 — End: 2015-11-26
  Filled 2015-11-25: qty 2

## 2015-11-25 MED ORDER — DIPHENHYDRAMINE HCL 25 MG PO CAPS: 25.0000 mg | ORAL_CAPSULE | Freq: Once | ORAL | Status: DC

## 2015-11-25 NOTE — Progress Notes (Signed)
Patient pleasant but noted to be isolated to room.  She refused to take her Abilify stating that she was not sick or depressed at all. MD notified. She also stated that she would like to be discharged today or tomorrow.

## 2015-11-25 NOTE — [facility] Suicide Risk Assessment (Signed)
[facility] Admission Suicide Risk Assessment   Nursing information obtained from:    Demographic factors:    Current Mental Status:    Loss Factors:    Historical Factors:    Risk Reduction Factors:    Total Time spent with patient: 1 hour Principal Problem: Delusional disorder ([place]) Diagnosis:   Patient Active Problem List   Diagnosis Date Noted  . Delusional disorder ([place]) [F22] 11/24/2015  . Suicidal ideation [R45.851] 11/24/2015  . Hypothyroidism [E03.9] 11/24/2015     Continued Clinical Symptoms:  Alcohol Use Disorder Identification Test Final Score (AUDIT): 0 The "Alcohol Use Disorders Identification Test", Guidelines for Use in Primary Care, Second Edition.  World Pharmacologist [facility]). Score between 0-7:  no or low risk or alcohol related problems. Score between 8-15:  moderate risk of alcohol related problems. Score between 16-19:  high risk of alcohol related problems. Score 20 or above:  warrants further diagnostic evaluation for alcohol dependence and treatment.   CLINICAL FACTORS:   Currently Psychotic   Musculoskeletal: Strength & Muscle Tone: within normal limits Gait & Station: normal Patient leans: N/A  Psychiatric Specialty Exam: Physical Exam  Nursing note and vitals reviewed. Constitutional: She is oriented to person, place, and time. She appears well-developed and well-nourished.  HENT:  Head: Normocephalic and atraumatic.  Eyes: Conjunctivae and EOM are normal. Pupils are equal, round, and reactive to light.  Neck: Normal range of motion. Neck supple.  Cardiovascular: Normal rate, regular rhythm and normal heart sounds.   Respiratory: Effort normal and breath sounds normal.  GI: Soft. Bowel sounds are normal.  Musculoskeletal: Normal range of motion.  Neurological: She is alert and oriented to person, place, and time.  Skin: Skin is warm and dry.    Review of Systems  All other systems reviewed and are negative.   Blood pressure 117/81, pulse  87, temperature 97.7 F (36.5 C), temperature source Oral, resp. rate 16, height 5\' 3"  (1.6 m), weight 82.555 kg (182 lb), SpO2 100 %.Body mass index is 32.25 kg/(m^2).  General Appearance: Casual  Eye Contact::  Good  Speech:  Clear and Coherent  Volume:  Normal  Mood:  Euthymic  Affect:  Appropriate  Thought Process:  Goal Directed  Orientation:  Full (Time, Place, and Person)  Thought Content:  Delusions and Paranoid Ideation  Suicidal Thoughts:  No  Homicidal Thoughts:  No  Memory:  Immediate;   Fair Recent;   Fair Remote;   Fair  Judgement:  Fair  Insight:  Fair  Psychomotor Activity:  Normal  Concentration:  Fair  Recall:  [employer] of [place]  Language: Fair  Akathisia:  No  Handed:  Right  AIMS (if indicated):     Assets:  Communication Skills Desire for Improvement Financial Resources/Insurance Housing Physical Health Resilience Social Support  Sleep:  Number of Hours: 4.45  Cognition: WNL  ADL's:  Intact     COGNITIVE FEATURES THAT CONTRIBUTE TO RISK:  None    SUICIDE RISK:   Minimal: No identifiable suicidal ideation.  Patients presenting with no risk factors but with morbid ruminations; may be classified as minimal risk based on the severity of the depressive symptoms  PLAN OF CARE:   Medical Decision Making:  New problem, with additional work up planned, Review of Psycho-Social Stressors (1), Review or order clinical lab tests (1), Review of Medication Regimen & Side Effects (2) and Review of New Medication or Change in Dosage (2)   Mrs. Kuramoto is a 65 year old female with  no past psychiatric history admitted to the hospital for suicidal threats and presumed paranoid delusions.  1. Suicidal ideation. The patient adamantly denies any thoughts plans or intention to hurt herself or others.  2. Paranoia. She believes that her husband is unfaithful. It is unclear whether these were false. She was started on Abilify by admitting psychiatrist but  refused to take it. The patient denies any psychotic symptoms.  3. Hypothyroidism. We will continue Synthroid.  4. Disposition. We will try to obtain collateral data from her sons. She will be discharged to home. She will follow up with [place].   I certify that inpatient services furnished can reasonably be expected to improve the patient's condition.   Arek Spadafore 11/25/2015, 2:03 PM
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
## 2015-11-25 NOTE — Plan of Care (Signed)
Problem: Alteration in mood; excessive anxiety as evidenced by: Goal: STG-Pt will report an absence of self-harm thoughts/actions (Patient will report an absence of self-harm thoughts or actions)  Outcome: Progressing Patient denies SI at this time.     

## 2015-11-25 NOTE — Tx Team (Addendum)
Initial Interdisciplinary Treatment Plan   PATIENT STRESSORS: Marital or family conflict   PATIENT STRENGTHS: Curator fund of knowledge Physical Health   PROBLEM LIST: Problem List/Patient Goals Date to be addressed Date deferred Reason deferred Estimated date of resolution  Suicidal Ideations 11/25/15     Delusional thoughts 11/25/15                                                DISCHARGE CRITERIA:  Improved stabilization in mood, thinking, and/or behavior  PRELIMINARY DISCHARGE PLAN: Outpatient therapy  PATIENT/FAMIILY INVOLVEMENT: This treatment plan has been presented to and reviewed with the patient, Susan Myers, and/or family member.  The patient and family have been given the opportunity to ask questions and make suggestions.  Derrill Kay 11/25/2015, 4:26 AM

## 2015-11-25 NOTE — BHH Suicide Risk Assessment (Signed)
[facility] INPATIENT:  Family/Significant Other Suicide Prevention Education  Suicide Prevention Education:  Education Completed;Richard Washinton [phone] (husband) has been identified by the patient as the family member/significant other with whom the patient will be residing, and identified as the person(s) who will aid the patient in the event of a mental health crisis (suicidal ideations/suicide attempt).  With written consent from the patient, the family member/significant other has been provided the following suicide prevention education, prior to the and/or following the discharge of the patient.  The suicide prevention education provided includes the following:  Suicide risk factors  Suicide prevention and interventions  National Suicide Hotline telephone number  [facility] assessment telephone number  [facility] Emergency Assistance [place] and/or Residential Mobile Crisis Unit telephone number  Request made of family/significant other to:  Remove weapons (e.g., guns, rifles, knives), all items previously/currently identified as safety concern.    Remove drugs/medications (over-the-counter, prescriptions, illicit drugs), all items previously/currently identified as a safety concern.  The family member/significant other verbalizes understanding of the suicide prevention education information provided.  The family member/significant other agrees to remove the items of safety concern listed above.  [place]  MSW, [place]   11/25/2015, 5:09 PM

## 2015-11-26 ENCOUNTER — Inpatient Hospital Stay: Payer: PPO

## 2015-11-26 LAB — URINALYSIS COMPLETE WITH MICROSCOPIC (ARMC ONLY)
Bacteria, UA: NONE SEEN
Bilirubin Urine: NEGATIVE
Glucose, UA: NEGATIVE mg/dL
Hgb urine dipstick: NEGATIVE
KETONES UR: NEGATIVE mg/dL
Nitrite: NEGATIVE
PH: 6 (ref 5.0–8.0)
PROTEIN: NEGATIVE mg/dL
RBC / HPF: NONE SEEN RBC/hpf (ref 0–5)
SPECIFIC GRAVITY, URINE: 1.005 (ref 1.005–1.030)

## 2015-11-26 LAB — AMMONIA: AMMONIA: 18 umol/L (ref 9–35)

## 2015-11-26 MED ORDER — LORAZEPAM 1 MG PO TABS
1.0000 mg | ORAL_TABLET | Freq: Every day | ORAL | Status: DC
  Administered 2015-11-26: 1 mg via ORAL
  Filled 2015-11-26: qty 1

## 2015-11-26 MED ORDER — PSEUDOEPHEDRINE HCL 30 MG PO TABS: 30.0000 mg | ORAL_TABLET | Freq: Every day | ORAL | Status: DC

## 2015-11-26 MED ORDER — PSEUDOEPHEDRINE HCL 30 MG PO TABS
30.0000 mg | ORAL_TABLET | Freq: Three times a day (TID) | ORAL | Status: DC | PRN
  Administered 2015-11-28: 30 mg via ORAL
  Filled 2015-11-26: qty 1

## 2015-11-26 MED ORDER — DIPHENHYDRAMINE HCL 25 MG PO CAPS: 50.0000 mg | ORAL_CAPSULE | Freq: Every day | ORAL | Status: DC

## 2015-11-26 MED ORDER — HYDROCHLOROTHIAZIDE 25 MG PO TABS: 25.0000 mg | ORAL_TABLET | Freq: Every day | ORAL | Status: DC

## 2015-11-26 MED ORDER — HYDROCHLOROTHIAZIDE 12.5 MG PO CAPS
12.5000 mg | ORAL_CAPSULE | Freq: Every day | ORAL | Status: DC
  Administered 2015-11-27 – 2015-11-29 (×3): 12.5 mg via ORAL
  Filled 2015-11-26 (×5): qty 1

## 2015-11-26 MED ORDER — ZOLPIDEM TARTRATE 5 MG PO TABS: 5.0000 mg | ORAL_TABLET | Freq: Every evening | ORAL | Status: DC | PRN

## 2015-11-26 MED ORDER — LEVOTHYROXINE SODIUM 25 MCG PO TABS
100.0000 ug | ORAL_TABLET | Freq: Every day | ORAL | Status: DC
  Administered 2015-11-27 – 2015-11-29 (×3): 100 ug via ORAL
  Filled 2015-11-26 (×4): qty 4

## 2015-11-26 MED ORDER — RISPERIDONE 1 MG PO TABS
2.0000 mg | ORAL_TABLET | Freq: Every day | ORAL | Status: DC
  Administered 2015-11-26 – 2015-11-28 (×3): 2 mg via ORAL
  Filled 2015-11-26 (×3): qty 2

## 2015-11-26 NOTE — BHH Group Notes (Signed)
[place] Group Notes:  (Nursing/MHT/Case Management/Adjunct)  Date:  11/26/2015  Time:  12:20 PM  Type of Therapy:  Psychoeducational Skills  Participation Level:  Active  Participation Quality:  Appropriate  Affect:  Appropriate  Cognitive:  Appropriate  Insight:  Appropriate  Engagement in Group:  Engaged  Modes of Intervention:  Discussion and Education  Summary of Progress/Problems:  Drake Leach 11/26/2015, 12:20 PM

## 2015-11-26 NOTE — Progress Notes (Signed)
Patient denies SI/HI. Patient pleasant. Patient states that she does not need to be here and would never hurt herself. The patient took her night dose of Depakote 500mg  but refused the morning dose. The patient states that she does not need medication. She states that she is not depressed. Medication for a leg pain level of 4 give. See Mar. No unsafe behaviors noted. Patient states that she is ready to go home. Q 15 min checks maintained. Will continue to monitor.

## 2015-11-26 NOTE — BHH Group Notes (Signed)
[place] Group Notes:  (Nursing/MHT/Case Management/Adjunct)  Date:  11/26/2015  Time:  10:31 PM  Type of Therapy:  Group Therapy  Participation Level:  Active  Participation Quality:  Appropriate and Attentive  Affect:  Appropriate  Cognitive:  Appropriate  Insight:  Appropriate  Engagement in Group:  Engaged  Modes of Intervention:  Support  Summary of Progress/Problems:  Susan Myers 11/26/2015, 10:31 PM

## 2015-11-26 NOTE — BHH Group Notes (Signed)
[facility] LCSW Group Therapy   11/26/2015 1 pm Type of Therapy: Group Therapy   Participation Level: Active   Participation Quality: Attentive, Sharing and Supportive   Affect: Appropriate  Cognitive: Alert and Oriented   Insight: Developing/Improving and Engaged   Engagement in Therapy: Developing/Improving and Engaged   Modes of Intervention: Clarification, Confrontation, Discussion, Education, Exploration,  Limit-setting, Orientation, Problem-solving, Rapport Building, Art therapist, Socialization and Support   Summary of Progress/Problems: Pt identified obstacles faced currently and processed barriers involved in overcoming these obstacles. Pt identified steps necessary for overcoming these obstacles and explored motivation (internal and external) for facing these difficulties head on. Pt further identified one area of concern in their lives and chose a goal to focus on for today. Pt shared that she was brought here under circumstances where her family "misunderstood" her intentions and that she denies SI.  Pt shared the barriers she hopes to overcome are not being discharged as quickly as he would like, but that she would make the best of it.  Pt was polite and cooperative with the CSW and other group members and focused and attentive to the topics discussed and the sharing of others. Pt did not share at length after her initial participation in group.    [name]. Jahmal Dunavant, LCSWA, LCAS  /17

## 2015-11-26 NOTE — BHH Group Notes (Signed)
[facility] LCSW Aftercare Discharge Planning Group Note  11/26/2015 9:15 AM  Participation Quality: Did Not Attend. Patient invited to participate but declined.   [name]. Haly Feher, MSW, LCSWA, LCAS

## 2015-11-26 NOTE — Progress Notes (Signed)
[facility] MD Progress Note  11/26/2015 2:47 PM MAKENNA AMOROSO  MRN:  [id number] Subjective:  Patient says she is here because her family was worried about her. She says that she is undergoing a lot of stress because her husband is having an affair. She made the comment to a friend that sometimes she felt like "blowing my brains out", she also made another comment in front of her family members saying "how many Tylenol tablets will take for somebody to die".    Review or records K she has had issues with depression in the past but patient says she is never taking any medications for depression or mental health. She denies feeling depressed at this time. She denies having SI, HI or auditory or visual hallucinations. She denies ever having any intention on harming herself. She denies having any side effects from medications.  She does not feel she needs any pharmacological agents at this time. She is interested on knowing when she is going to be discharged.  Per nursing:   D: Per pt self inventory pt reports sleeping good, appetite good, energy level normal, ability to pay attention good, rates depression at a 0 out of 10, hopelessness at a 0 out of 10, anxiety at a 0 out of 10, denies SI/HI/AVH, states "i never had that thought", bright during interaction, goal today: "going home where I belong"   A: Emotional support provided, Encouraged pt to continue with treatment plan and attend all group activities, q15 min checks maintained for safety.  R: Pt is receptive, going to groups, pleasant and cooperative with staff and other patients on the unit.      Principal Problem: Delusional disorder [facility]) Diagnosis:   Patient Active Problem List   Diagnosis Date Noted  . Delusional disorder ([place]) [F22] 11/24/2015  . Suicidal ideation [R45.851] 11/24/2015  . Hypothyroidism [E03.9] 11/24/2015   Total Time spent with patient: 30 minutes  Past Psychiatric History: none  Past Medical History:  Past  Medical History  Diagnosis Date  . Kidney stones   . Thyroid disease    History reviewed. No pertinent past surgical history.  Family History:  Family History  Problem Relation Age of Onset  . Family history unknown: Yes   Family Psychiatric  History: denies  Social History: She's been married for 45 years. She is retired now. She has 2 adult sons and several grandchildren whom she loves dearly. She is a [place] woman. History  Alcohol Use No     History  Drug Use No    Social History   Social History  . Marital Status: Married    Spouse Name: N/A  . Number of Children: N/A  . Years of Education: N/A   Social History Main Topics  . Smoking status: Never Smoker   . Smokeless tobacco: Never Used  . Alcohol Use: No  . Drug Use: No  . Sexual Activity: Not Currently   Other Topics Concern  . None   Social History Narrative   Additional Social History:    History of alcohol / drug use?: No history of alcohol / drug abuse   Sleep: Fair  Appetite:  Good  Current Medications: Current Facility-Administered Medications  Medication Dose Route Frequency Provider Last Rate Last Dose  . acetaminophen (TYLENOL) tablet 650 mg  650 mg Oral Q6H PRN Gonzella Lex, MD   650 mg at 11/26/15 0527  . alum & mag hydroxide-simeth (MAALOX/MYLANTA) 200-200-20 MG/5ML suspension 30 mL  30 mL Oral Q4H  PRN Gonzella Lex, MD      . ARIPiprazole (ABILIFY) tablet 5 mg  5 mg Oral QHS Jolanta B Pucilowska, MD      . diphenhydrAMINE (BENADRYL) capsule 50 mg  50 mg Oral QHS Hildred Priest, MD      . hydrochlorothiazide (MICROZIDE) capsule 12.5 mg  12.5 mg Oral Daily Hildred Priest, MD      . ibuprofen (ADVIL,MOTRIN) tablet 600 mg  600 mg Oral Once Gonzella Lex, MD   600 mg at 11/24/15 2256  . [START ON 11/27/2015] levothyroxine (SYNTHROID, LEVOTHROID) tablet 100 mcg  100 mcg Oral QAC breakfast Hildred Priest, MD      . magnesium hydroxide (MILK OF MAGNESIA)  suspension 30 mL  30 mL Oral Daily PRN Gonzella Lex, MD   30 mL at 11/24/15 2259  . pseudoephedrine (SUDAFED) tablet 30 mg  30 mg Oral Q8H PRN Hildred Priest, MD        Lab Results: No results found for this or any previous visit (from the past 48 hour(s)).  Physical Findings: AIMS: Facial and Oral Movements Muscles of Facial Expression: None, normal Lips and Perioral Area: None, normal Jaw: None, normal Tongue: None, normal,Extremity Movements Upper (arms, wrists, hands, fingers): None, normal Lower (legs, knees, ankles, toes): None, normal, Trunk Movements Neck, shoulders, hips: None, normal, Overall Severity Severity of abnormal movements (highest score from questions above): None, normal Incapacitation due to abnormal movements: None, normal Patient's awareness of abnormal movements (rate only patient's report): No Awareness, Dental Status Current problems with teeth and/or dentures?: No Does patient usually wear dentures?: No  CIWA:    COWS:     Musculoskeletal: Strength & Muscle Tone: within normal limits Gait & Station: normal Patient leans: N/A  Psychiatric Specialty Exam: Review of Systems  Constitutional: Negative.   HENT: Negative.   Eyes: Negative.   Respiratory: Negative.   Cardiovascular: Negative.   Gastrointestinal: Negative.   Genitourinary: Positive for dysuria.  Musculoskeletal: Negative.   Skin: Negative.   Neurological: Negative.   Endo/Heme/Allergies: Negative.   Psychiatric/Behavioral: Negative.     Blood pressure 97/63, pulse 83, temperature 97.6 F (36.4 C), temperature source Oral, resp. rate 16, height 5\' 3"  (1.6 m), weight 82.555 kg (182 lb), SpO2 100 %.Body mass index is 32.25 kg/(m^2).  General Appearance: Well Groomed  Engineer, water::  Good  Speech:  Clear and Coherent  Volume:  Normal  Mood:  Euthymic  Affect:  Congruent  Thought Process:  Linear  Orientation:  Full (Time, Place, and Person)  Thought Content:   Hallucinations: None  Suicidal Thoughts:  No  Homicidal Thoughts:  No  Memory:  Immediate;   Good Recent;   Good Remote;   Good  Judgement:  Fair  Insight:  Shallow  Psychomotor Activity:  Normal  Concentration:  Good  Recall:  Good  Fund of Knowledge:Good  Language: Good  Akathisia:  no  Handed:    AIMS (if indicated):     Assets:  Agricultural consultant Housing Physical Health Social Support  ADL's:  Intact  Cognition: WNL  Sleep:  Number of Hours: 4.5   Treatment Plan Summary: Daily contact with patient to assess and evaluate symptoms and progress in treatment and Medication management   The patient is a 65 year old Caucasian female with no known prior psychiatric history who was brought into our hospital due to making suicidal threats. Patient's major stressor is believing that her husband is having an affair. Per the information obtained in the  emergency department her family thinks this is delusional in nature.  Delusional disorder: Patient has been is started on Abilify 5 mg by mouth daily at bedtime but she hasn't taken it yet.  She does not feel there is any need for her to take any pharmacological agents, she is willing to try the Abilify tonight. In order to minimize medication regimen I will discontinue Depakote.  Insomnia: Patient only slept 4 hours last night. I will order Benadryl 50 mg by mouth daily at bedtime   Nasal congestion: Patient is requesting pseudoephedrine when necessary for nasal congestion.  I will order pseudoephedrine 30 mg every 8 hours as needed  Hypothyroidism: Continue Synthroid 100 g daily  Edema: Patient has requested to start hydrochlorothiazide as she develops Lower extremity edema when she doesn't take it. Patient will be started on 12.5 mg a day  UTI: Patient complains of UTI symptoms. I will order a UA and a culture.  Diet regular  Precautions every 15 minute checks  Collateral information will be  obtained from the patient's husband, Esmer Echard [phone], [phone]  Discharge disposition: Once a stable she will return to her house  Discharge follow-up: We will arrange follow-up once stable with a psychiatrist.    Hildred Priest 11/26/2015, 2:47 PM

## 2015-11-26 NOTE — Progress Notes (Signed)
D:  Per pt self inventory pt reports sleeping good, appetite good, energy level normal, ability to pay attention good, rates depression at a 0 out of 10, hopelessness at a 0 out of 10, anxiety at a 0 out of 10, denies SI/HI/AVH, states "i never had that thought", bright during interaction, goal today:  "going home where I belong"     A:  Emotional support provided, Encouraged pt to continue with treatment plan and attend all group activities, q15 min checks maintained for safety.  R:  Pt is receptive, going to groups, pleasant and cooperative with staff and other patients on the unit.

## 2015-11-26 NOTE — BHH Group Notes (Signed)
[place] LCSW Group Therapy   11/26/2015 1 pm  Type of Therapy: Group Therapy   Participation Level: Active   Participation Quality: Attentive, Sharing and Supportive   Affect: Depressed and Flat   Cognitive: Alert and Oriented   Insight: Developing/Improving and Engaged   Engagement in Therapy: Developing/Improving and Engaged   Modes of Intervention: Clarification, Confrontation, Discussion, Education, Exploration,  Limit-setting, Orientation, Problem-solving, Rapport Building, Art therapist, Socialization and Support   Summary of Progress/Problems: Pt identified obstacles faced currently and processed barriers involved in overcoming these obstacles. Pt identified steps necessary for overcoming these obstacles and explored motivation (internal and external) for facing these difficulties head on. Pt further identified one area of concern in their lives and chose a goal to focus on for today. Pt shared at length her relevation that her relatives had IVC'd her and how surprised she was at this turn of events due to her family accusing her of being SI. Pt shared she now feels that this experience led her to getting needed help at the hospital, but that she doesn't really need to be here to get it.  Pt denied ever being SI. Pt shared she enjoys not feeling judged, not feeling alone and the ability to be given medication until she feels she is stabilized. Pt was polite and cooperative with the CSW and other group members and focused and attentive to the topics discussed and the sharing of others.  [name]. Radley Teston, LCSWA, LCAS  11/26/15

## 2015-11-26 NOTE — Plan of Care (Signed)
Problem: Diagnosis: Increased Risk For Suicide Attempt Goal: LTG-Patient Will Report Improved Mood and Deny Suicidal LTG (by discharge) Patient will report improved mood and deny suicidal ideation.  Outcome: Progressing Pt denies SI/HI/AVH, verbal contract to report SI to staff, remains safe.

## 2015-11-27 ENCOUNTER — Inpatient Hospital Stay: Payer: PPO

## 2015-11-27 DIAGNOSIS — I679 Cerebrovascular disease, unspecified: Secondary | ICD-10-CM

## 2015-11-27 DIAGNOSIS — N390 Urinary tract infection, site not specified: Secondary | ICD-10-CM

## 2015-11-27 DIAGNOSIS — I10 Essential (primary) hypertension: Secondary | ICD-10-CM

## 2015-11-27 LAB — VITAMIN B12: Vitamin B-12: 279 pg/mL (ref 180–914)

## 2015-11-27 MED ORDER — GADOBENATE DIMEGLUMINE 529 MG/ML IV SOLN
20.0000 mL | Freq: Once | INTRAVENOUS | Status: AC | PRN
  Administered 2015-11-27: 17 mL via INTRAVENOUS

## 2015-11-27 MED ORDER — NITROFURANTOIN MONOHYD MACRO 100 MG PO CAPS
100.0000 mg | ORAL_CAPSULE | Freq: Two times a day (BID) | ORAL | Status: DC
  Administered 2015-11-27 – 2015-11-29 (×5): 100 mg via ORAL
  Filled 2015-11-27 (×5): qty 1

## 2015-11-27 MED ORDER — LORAZEPAM 0.5 MG PO TABS
0.5000 mg | ORAL_TABLET | Freq: Every day | ORAL | Status: DC
  Administered 2015-11-27 – 2015-11-28 (×2): 0.5 mg via ORAL
  Filled 2015-11-27 (×2): qty 1

## 2015-11-27 NOTE — BHH Group Notes (Signed)
[place] LCSW Group Therapy   11/27/2015 11:00 am  Type of Therapy: Group Therapy   Participation Level: Active   Participation Quality: Attentive, Sharing and Supportive   Affect: Appropriate  Cognitive: Alert and Oriented   Insight: Developing/Improving and Engaged   Engagement in Therapy: Developing/Improving and Engaged   Modes of Intervention: Clarification, Confrontation, Discussion, Education, Exploration,  Limit-setting, Orientation, Problem-solving, Rapport Building, Art therapist, Socialization and Support  Summary of Progress/Problems: The topic for group therapy was feelings about diagnosis. Pt actively participated in group discussion on their past and current diagnosis and how they feel towards this. Pt also identified how society and family members judge them, based on their diagnosis as well as stereotypes and stigmas. Pt shared she was being discharged and that she felt that if she continues to take medication and seek outpatient therapy she will make a full recovery, despite the stigma of mental illness. Pt shared she now feels positive about her mental health diagnosis if it leads to a successful recovery, but does deny that she is SI, despite the beliefs of her family members.  Pt shared she feels stigmatized, but that she is ready to move forward despite her feeling judged.    [name]. Jamel Dunton, LCSWA, LCAS

## 2015-11-27 NOTE — Progress Notes (Addendum)
[facility] MD Progress Note  11/27/2015 5:08 PM Susan Myers  MRN:  [id number] Subjective:  Patient says she is here because her family was worried about her. She says that she is undergoing a lot of stress because her husband is having an affair. She made the comment to a friend that sometimes she felt like "blowing my brains out", she also made another comment in front of her family members saying "how many Tylenol tablets will take for somebody to die".  Today the patient reports doing well. She denies issues with mood, appetite, energy, sleep or concentration. She was able to sleep 6 hours last night. This morning she feels a little overly sedated. Other than that denied any issues. She denies SI, HI or auditory or visual hallucinations. She is is still convinced her husband has been cheating on her for several years. She tells me he has gone on cruises with his lover. She also tells me when they have gone on vacation on cruises together this woman has also been in the same ship.  I spoke with the patient's husband yesterday who reports that the patient has always been suspicious about him having an affair, and this is been going on for about 15 years. He says however that over the last year things have worsened significantly and she is obsessed and constantly preoccupied with it.  He tells me she has been accusing him on going on a cruise to [place] but she he is states he's never been to [place] his life.  He is frustrated as he does not know what evidence to show her in order to prove his not having an affair. He says he gives her his phone and acess to his accounts but does not enough. She also accuses him of having some credit card that he doesn't have. He has asked his sons to praying and. Reports that she can see there are no such credit cards but this is again not enough evidence to convince her otherwise.  He also stated the patient is under a lot of stress because she is the main caregiver for her  elderly mother who is very mean and difficult. He also reports that she has chronic issues with insomnia and requested medications to help her with that.  Review or records showed she has had issues with depression in the past but patient says she is never taking any medications for depression or mental health. She denies feeling depressed at this time. She denies having SI, HI or auditory or visual hallucinations. She denies ever having any intention on harming herself. She denies having any side effects from medications.  She does not feel she needs any pharmacological agents at this time. She is interested on knowing when she is going to be discharged.  Per nursing:   Patient denies SI/AV/HI during shift. Patient is pleasant and is compliant with her medications this shift. Patient socializes with peers. No unsafe behaviors noted. Will continue to monitor.Patient completes self audit sheet today. Patient is currently preparing to go with staff and security for MRI.  Principal Problem: Delusional disorder [facility]) Diagnosis:   Patient Active Problem List   Diagnosis Date Noted  . Cerebrovascular disease [I67.9] 11/27/2015  . UTI (urinary tract infection) [N39.0] 11/27/2015  . Delusional disorder ([place]) [F22] 11/24/2015  . Hypothyroidism [E03.9] 11/24/2015   Total Time spent with patient: 30 minutes  Past Psychiatric History: none  Past Medical History:  Past Medical History  Diagnosis Date  . Kidney  stones   . Thyroid disease    History reviewed. No pertinent past surgical history.  Family History:  Family History  Problem Relation Age of Onset  . Family history unknown: Yes   Family Psychiatric  History: denies  Social History: She's been married for 45 years. She is retired now. She has 2 adult sons and several grandchildren whom she loves dearly. She is a [place] woman. History  Alcohol Use No     History  Drug Use No    Social History   Social History  . Marital  Status: Married    Spouse Name: N/A  . Number of Children: N/A  . Years of Education: N/A   Social History Main Topics  . Smoking status: Never Smoker   . Smokeless tobacco: Never Used  . Alcohol Use: No  . Drug Use: No  . Sexual Activity: Not Currently   Other Topics Concern  . None   Social History Narrative   Additional Social History:    History of alcohol / drug use?: No history of alcohol / drug abuse   Sleep: Fair  Appetite:  Good  Current Medications: Current Facility-Administered Medications  Medication Dose Route Frequency Provider Last Rate Last Dose  . acetaminophen (TYLENOL) tablet 650 mg  650 mg Oral Q6H PRN Gonzella Lex, MD   650 mg at 11/26/15 0527  . alum & mag hydroxide-simeth (MAALOX/MYLANTA) 200-200-20 MG/5ML suspension 30 mL  30 mL Oral Q4H PRN Gonzella Lex, MD      . hydrochlorothiazide (MICROZIDE) capsule 12.5 mg  12.5 mg Oral Daily Hildred Priest, MD   12.5 mg at 11/27/15 0945  . ibuprofen (ADVIL,MOTRIN) tablet 600 mg  600 mg Oral Once Gonzella Lex, MD   600 mg at 11/24/15 2256  . levothyroxine (SYNTHROID, LEVOTHROID) tablet 100 mcg  100 mcg Oral QAC breakfast Hildred Priest, MD   100 mcg at 11/27/15 0656  . LORazepam (ATIVAN) tablet 0.5 mg  0.5 mg Oral QHS Hildred Priest, MD      . magnesium hydroxide (MILK OF MAGNESIA) suspension 30 mL  30 mL Oral Daily PRN Gonzella Lex, MD   30 mL at 11/26/15 2134  . nitrofurantoin (macrocrystal-monohydrate) (MACROBID) capsule 100 mg  100 mg Oral Q12H Hildred Priest, MD   100 mg at 11/27/15 1225  . pseudoephedrine (SUDAFED) tablet 30 mg  30 mg Oral Q8H PRN Hildred Priest, MD      . risperiDONE (RISPERDAL) tablet 2 mg  2 mg Oral QHS Hildred Priest, MD   2 mg at 11/26/15 2130    Lab Results:  Results for orders placed or performed during the hospital encounter of 11/24/15 (from the past 48 hour(s))  Ammonia     Status: None   Collection Time:  11/26/15  3:27 PM  Result Value Ref Range   Ammonia 18 9 - 35 umol/L  Urinalysis complete, with microscopic ([facility] only)     Status: Abnormal   Collection Time: 11/26/15  3:46 PM  Result Value Ref Range   Color, Urine STRAW (A) YELLOW   APPearance CLEAR (A) CLEAR   Glucose, UA NEGATIVE NEGATIVE mg/dL   Bilirubin Urine NEGATIVE NEGATIVE   Ketones, ur NEGATIVE NEGATIVE mg/dL   Specific Gravity, Urine 1.005 1.005 - 1.030   Hgb urine dipstick NEGATIVE NEGATIVE   pH 6.0 5.0 - 8.0   Protein, ur NEGATIVE NEGATIVE mg/dL   Nitrite NEGATIVE NEGATIVE   Leukocytes, UA 2+ (A) NEGATIVE   RBC / HPF  NONE SEEN 0 - 5 RBC/hpf   WBC, UA 0-5 0 - 5 WBC/hpf   Bacteria, UA NONE SEEN NONE SEEN   Squamous Epithelial / LPF 0-5 (A) NONE SEEN  Urine culture     Status: None (Preliminary result)   Collection Time: 11/26/15  3:46 PM  Result Value Ref Range   Specimen Description URINE, CLEAN CATCH    Special Requests Normal    Culture TOO YOUNG TO READ    Report Status PENDING     Physical Findings: AIMS: Facial and Oral Movements Muscles of Facial Expression: None, normal Lips and Perioral Area: None, normal Jaw: None, normal Tongue: None, normal,Extremity Movements Upper (arms, wrists, hands, fingers): None, normal Lower (legs, knees, ankles, toes): None, normal, Trunk Movements Neck, shoulders, hips: None, normal, Overall Severity Severity of abnormal movements (highest score from questions above): None, normal Incapacitation due to abnormal movements: None, normal Patient's awareness of abnormal movements (rate only patient's report): No Awareness, Dental Status Current problems with teeth and/or dentures?: No Does patient usually wear dentures?: No  CIWA:    COWS:     Musculoskeletal: Strength & Muscle Tone: within normal limits Gait & Station: normal Patient leans: N/A  Psychiatric Specialty Exam: Review of Systems  Constitutional: Negative.   HENT: Negative.   Eyes: Negative.    Respiratory: Negative.   Cardiovascular: Negative.   Gastrointestinal: Negative.   Genitourinary: Positive for dysuria.  Musculoskeletal: Negative.   Skin: Negative.   Neurological: Negative.   Endo/Heme/Allergies: Negative.   Psychiatric/Behavioral: Negative.     Blood pressure 114/78, pulse 103, temperature 97.6 F (36.4 C), temperature source Oral, resp. rate 16, height 5\' 3"  (1.6 m), weight 82.555 kg (182 lb), SpO2 100 %.Body mass index is 32.25 kg/(m^2).  General Appearance: Well Groomed  Engineer, water::  Good  Speech:  Clear and Coherent  Volume:  Normal  Mood:  Euthymic  Affect:  Congruent  Thought Process:  Linear  Orientation:  Full (Time, Place, and Person)  Thought Content:  Hallucinations: None  Suicidal Thoughts:  No  Homicidal Thoughts:  No  Memory:  Immediate;   Good Recent;   Good Remote;   Good  Judgement:  Fair  Insight:  Shallow  Psychomotor Activity:  Normal  Concentration:  Good  Recall:  Good  Fund of Knowledge:Good  Language: Good  Akathisia:  no  Handed:    AIMS (if indicated):     Assets:  Agricultural consultant Housing Physical Health Social Support  ADL's:  Intact  Cognition: WNL  Sleep:  Number of Hours: 6.75   Treatment Plan Summary: Daily contact with patient to assess and evaluate symptoms and progress in treatment and Medication management   The patient is a 65 year old Caucasian female with no known prior psychiatric history who was brought into our hospital due to making suicidal threats. Patient's major stressor is believing that her husband is having an affair. Per the information obtained in the emergency department her family thinks this is delusional in nature.  Delusional disorder: patient has been is started on Risperdal 2 mg by mouth daily at bedtime. This agent was chosen as patient has chronic issues with insomnia. Abilify which was started in the emergency department has been discontinued.  Patient took her first dose of Risperdal last night and tolerated it well.  Organic causes of psychosis will be rule out. HIV, RPR and vitamin B12 have been ordered but are pending. Head CT was completed yesterday and he shows brain atrophy and  cerebrovascular disease. We'll order an MRI today.  Insomnia: patient is slept 6 hours last night. Continue Ativan but I will decrease the dose from 1 mg  to 0.5 mg as patient felt overly sedated this morning.  Nasal congestion: Patient is requesting pseudoephedrine when necessary for nasal congestion.  I will order pseudoephedrine 30 mg every 8 hours as needed  Hypothyroidism: Continue Synthroid 100 g daily  Edema: continue hydrochlorothiazide 12.5 mg by mouth daily as patient's reports she was taking his medication at home  UTI: Patient complains of UTI symptoms.UA is suggestive of UTI. She will be started on Macrobid 100 mg amount twice a day for 7 days.  Diet regular  Precautions every 15 minute checks  Collateral information will be obtained from the patient's husband, Hildreth Masten [phone], [phone]  Discharge disposition: Once a stable she will return to her house  Discharge follow-up: We will arrange follow-up once stable with a psychiatrist.  Labs: HIV, RPR and vitamin B12 are still pending. Ammonia level was within the normal limits.  Head CT: 1. Atrophy and small vessel disease. 2. Probable calcified left frontal meningioma 1.1 cm in diameter. 3. No evidence for acute intracranial abnormality. Plan to order an MRI today.   Hildred Priest 11/27/2015, 5:08 PM

## 2015-11-27 NOTE — Progress Notes (Addendum)
Patient denies SI/AV/HI during shift. Patient is pleasant and is compliant with her medications this shift. Patient socializes with peers. No unsafe behaviors noted. Will continue to monitor.Patient completes self audit sheet today. Patient is currently preparing to go with staff and security for MRI.
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
## 2015-11-27 NOTE — Plan of Care (Signed)
Problem: Consults Goal: [facility] General Treatment Patient Education Outcome: Progressing [place] participating in plan of care.

## 2015-11-27 NOTE — Progress Notes (Signed)
Recreation Therapy Notes  INPATIENT RECREATION THERAPY ASSESSMENT  Patient Details Name: Susan Myers MRN: [id number] DOB: Oct 29, 1951 Today's Date: 11/27/2015  Patient Stressors: Relationship, Other (Comment) (Husband is having an affair and is in denial about it - it has been going on for years; patient is taking care of her mother who is 36 and it's all on her)  Coping Skills:   Exercise, Art/Dance, Talking, Music, Other (Comment) (Scrapbook, back cakes, get on computer to look at [employer] and [employer])  Personal Challenges: Anger, Communication, Concentration, Decision-Making, Expressing Yourself, Problem-Solving, Relationships, Self-Esteem/Confidence, Social Interaction, Stress Management, Time Management, Trusting Others  Leisure Interests (2+):  Individual - Other (Comment) (Being with grandchildren, making cards, scrapbook, bake cakes)  Awareness of Community Resources:  Yes  Community Resources:  Park, Special educational needs teacher (Comment) Optician, dispensing)  Current Use: No  If no, Barriers?: Other (Comment) (Just haven't been)  Patient Strengths:  Left handed, "There is only one me"  Patient Identified Areas of Improvement:  Being a better person  Current Recreation Participation:  Scrapbooking with friends  Patient Goal for Hospitalization:  To get out  [place] of Residence:  [place] of Residence:  [place]   Current SI (including self-harm):  No  Current HI:  No  Consent to Intern Participation: N/A   Leonette Monarch, LRT/CTRS 11/27/2015, 3:40 PM

## 2015-11-27 NOTE — BHH Group Notes (Signed)
[place] Group Notes:  (Nursing/MHT/Case Management/Adjunct)  Date:  11/27/2015  Time:  2:25 PM  Type of Therapy:  Psychoeducational Skills  Participation Level:  Active  Participation Quality:  Appropriate, Attentive and Sharing  Affect:  Appropriate  Cognitive:  Alert and Appropriate  Insight:  Appropriate and Good  Engagement in Group:  Engaged  Modes of Intervention:  Discussion, Education and Support  Summary of Progress/Problems:  Susan Myers 11/27/2015, 2:25 PM

## 2015-11-28 ENCOUNTER — Encounter: Payer: Self-pay | Admitting: Psychiatry

## 2015-11-28 DIAGNOSIS — E7800 Pure hypercholesterolemia, unspecified: Secondary | ICD-10-CM

## 2015-11-28 LAB — RPR: RPR: NONREACTIVE

## 2015-11-28 LAB — URINE CULTURE: Special Requests: NORMAL

## 2015-11-28 LAB — HIV ANTIBODY (ROUTINE TESTING W REFLEX): HIV SCREEN 4TH GENERATION: NONREACTIVE

## 2015-11-28 MED ORDER — LORAZEPAM 0.5 MG PO TABS: 0.5000 mg | ORAL_TABLET | Freq: Every day | ORAL | Status: DC

## 2015-11-28 MED ORDER — RISPERIDONE 2 MG PO TABS: 2.0000 mg | ORAL_TABLET | Freq: Every day | ORAL | Status: DC

## 2015-11-28 MED ORDER — NITROFURANTOIN MONOHYD MACRO 100 MG PO CAPS: 100.0000 mg | ORAL_CAPSULE | Freq: Two times a day (BID) | ORAL | Status: DC

## 2015-11-28 MED ORDER — HYDROCHLOROTHIAZIDE 12.5 MG PO CAPS: 12.5000 mg | ORAL_CAPSULE | Freq: Every day | ORAL | Status: DC

## 2015-11-28 MED ORDER — LEVOTHYROXINE SODIUM 100 MCG PO TABS: 100.0000 ug | ORAL_TABLET | Freq: Every day | ORAL | Status: DC

## 2015-11-28 NOTE — Progress Notes (Signed)
Pt pleasant and cooperative with care. Denies SI, HI, AVH. Med compliant. Does attend some groups. Pt smiling this shift, and isolates to room. Minimal interaction with staff and peers. Encouragement and support offered. Pt receptive. Will continue to monitor for q 15 min checks.

## 2015-11-28 NOTE — BHH Group Notes (Signed)
[facility] LCSW Group Therapy   11/28/2015 1:15 PM   Type of Therapy: Group Therapy   Participation Level: Active   Participation Quality: Attentive, Sharing and Supportive   Affect: Depressed and Flat   Cognitive: Alert and Oriented   Insight: Developing/Improving and Engaged   Engagement in Therapy: Developing/Improving and Engaged   Modes of Intervention: Clarification, Confrontation, Discussion, Education, Exploration, Limit-setting, Orientation, Problem-solving, Rapport Building, Art therapist, Socialization and Support   Summary of Progress/Problems: The topic for group today was emotional regulation. This group focused on both positive and negative emotion identification and allowed group members to process ways to identify feelings, regulate negative emotions, and find healthy ways to manage internal/external emotions. Group members were asked to reflect on a time when their reaction to an emotion led to a negative outcome and explored how alternative responses using emotion regulation would have benefited them. Group members were also asked to discuss a time when emotion regulation was utilized when a negative emotion was experienced. Pt shared that in the past she said things that sounded as if she were suicidal in order to receive attention she felt she was being denied from her husband.  Pt shared she hoped to seek outpatient treatment instead in order to avoid being admitted into the hospital on what she views as a misunderstanding by her family members.  Pt shared she hopes to cope better in the future with her thoughts and feelings which she feels have cost her by being admitted as a result of them.    [name]. Anis Degidio, MSW, LCSWA, LCAS

## 2015-11-28 NOTE — Progress Notes (Signed)
A&Ox3, VSS, interacting well with peers playing cards in the day room; denied SI/SIB; denied AV/H. Mood and affect appropriate, denied pain

## 2015-11-28 NOTE — Progress Notes (Signed)
Recreation Therapy Notes  Date: 01.04.17 Time: 3:00 pm Location: Craft Room  Group Topic: Self-esteem  Goal Area(s) Addresses:  Patient will write at least one positive trait. Patient will verbalize benefit of having a healthy self-esteem.  Behavioral Response: Attentive, Interactive  Intervention: I Am  Activity: Patients were given a worksheet with the letter I on it and instructed to write as many positive traits inside the letter as they could.  Education: LRT educated patients on ways they can increase their self-esteem.  Education Outcome: Acknowledges education/In group clarification offered  Clinical Observations/Feedback: Patient completed activity by writing positive traits. Patient contributed to group discussion by stating that it was easier for her to think of positive traits and why, and how her self-esteem affects her every day.  Leonette Monarch, LRT/CTRS 11/28/2015 4:18 PM

## 2015-11-28 NOTE — Plan of Care (Addendum)
Problem: Diagnosis: Increased Risk For Suicide Attempt Goal: STG-Patient Will Report Suicidal Feelings to Staff Outcome: Progressing Denied SI, "I made a mistake when I asked how much Tylenol will it take to kill a person? I shouldn't have asked that? I made a mistake telling a friend that I feel like blowing my brains out! know better now!" Patient encouraged to continue to express feelings to staffs.

## 2015-11-28 NOTE — B[facility] Group Notes (Signed)
[facility] Group Notes:  (Nursing/MHT/Case Management/Adjunct)  Date:  11/28/2015  Time:  6:15 PM  Type of Therapy:  Psychoeducational Skills  Participation Level:  Active  Participation Quality:  Attentive  Affect:  Appropriate  Cognitive:  Alert  Insight:  Appropriate  Engagement in Group:  Engaged  Modes of Intervention:  Education  Summary of Progress/Problems:  Jessee Avers 11/28/2015, 6:15 PM

## 2015-11-28 NOTE — B[facility] Group Notes (Signed)
[facility] LCSW Aftercare Discharge Planning Group Note  11/28/2015 9:15 AM  Participation Quality: Did Not Attend. Patient invited to participate but declined.   [name]. Maryjean Corpening, MSW, LCSWA, LCAS

## 2015-11-28 NOTE — Plan of Care (Signed)
Problem: Diagnosis: Increased Risk For Suicide Attempt Goal: STG-Patient Will Attend All Groups On The Unit Outcome: Progressing Attending groups Goal: STG-Patient Will Comply With Medication Regime Outcome: Progressing Med compliant

## 2015-11-28 NOTE — Plan of Care (Signed)
Problem: [facility] Participation in Recreation Therapeutic Interventions Goal: STG-Patient will demonstrate improved self esteem by identif STG: Self-Esteem - Within 4 treatment sessions, patient will verbalize at least 5 positive affirmation statements in each of 2 treatment sessions to increase self-esteem post d/c.  Outcome: Progressing Treatment Session 1; Completed 1 out of 2: At approximately 12:00 pm, LRT met with patient in craft room. Patient verbalized 5 positive affirmation statements. Patient reported it felt "good". LRT encouraged patient to continue saying positive affirmation statements. Intervention Used: I Am statements  Leonette Monarch, LRT/CTRS 01.04.17 2:00 pm Goal: STG-Patient will identify at least five coping skills for ** STG: Coping Skills - Within 4 treatment sessions, patient will verbalize at least 5 coping skills for anger in each of 2 treatment sessions to increase anger management skills post d/c.  Outcome: Progressing Treatment Session 1; Completed 1 out of 2: At approximately 12:00 pm, LRT met with a patient in craft room. Patient verbalized 5 coping skills for anger. Patient verbalized what triggers her to get angry, how her body responds to anger, and how she is going to remember to use her healthy coping skills. LRT provided suggestions as well. Intervention Used: Coping Skills worksheet  Leonette Monarch, LRT/CTRS 01.04.17 2:02 pm Goal: STG-Other Recreation Therapy Goal (Specify) STG: Stress Management - Within 4 treatment sessions, patient will verbalize understanding of the stress management techniques in each of 2 treatment sessions to increase stress management skills post d/c.  Outcome: Progressing Treatment Session 1; Completed 1 out of 2: At approximately 12:00 pm, LRT met with patient in craft room. LRT educated and provided patient with handouts on stress management techniques. Patient verbalized understanding. LRT encouraged patient to read over and  practice the stress management techniques. Intervention Used: Stress Management handouts  Leonette Monarch, LRT/CTRS 01.04.17 2:04 pm

## 2015-11-28 NOTE — Progress Notes (Addendum)
[facility] MD Progress Note  11/28/2015 12:13 PM Susan Myers  MRN:  [id number] Subjective:  Patient says she is here because her family was worried about her. She says that she is undergoing a lot of stress because her husband is having an affair. She made the comment to a friend that sometimes she felt like "blowing my brains out", she also made another comment in front of her family members saying "how many Tylenol tablets will take for somebody to die".  Today the patient reports doing well. She denies issues with mood, appetite, energy, sleep or concentration. She was able to sleep >6 hours last night. She no longer seemed oversedated in the morning as the dose of Ativan was decreased from 1 mg to 0.5 mg at bedtime. She is tolerating Risperdal well and denies any side effects. Other than that denied any issues. She denies SI, HI or auditory or visual hallucinations. She is is still convinced her husband has been cheating on her for several years.   Yesterday She told me he has gone on cruises with his lover. She also tells me when they have gone on vacation on cruises together this woman has also been in the same ship.  I spoke with the patient's husband on 11/26/15 who reports that the patient has always been suspicious about him having an affair, and this is been going on for about 15 years. He says however that over the last year things have worsened significantly and she is obsessed and constantly preoccupied with it.  He tells me she has been accusing him on going on a cruise to [place] but she he is states he's never been to [place] his life.  He is frustrated as he does not know what evidence to show her in order to prove his not having an affair. He says he gives her his phone and acess to his accounts but does not enough. She also accuses him of having some credit card that he doesn't have. He has asked his sons to praying and. Reports that she can see there are no such credit cards but this is again  not enough evidence to convince her otherwise.  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 He also reports that she has chronic issues with insomnia and requested medications to help her with that.  Review or records showed she has had issues with depression in the past but patient says she is never taking any medications for depression or mental health. She denies feeling depressed at this time. She denies having SI, HI or auditory or visual hallucinations. She denies ever having any intention on harming herself. She denies having any side effects from medications.  She does not feel she needs any pharmacological agents at this time but is willing to follow through with our recommendations.  Patient has been compliant with all her medications since Monday.  Per nursing: A&Ox3, VSS, interacting well with peers playing cards in the day room; denied SI/SIB; denied AV/H. Mood and affect appropriate, denied pain  Principal Problem: Delusional disorder ([place]) Diagnosis:   Patient Active Problem List   Diagnosis Date Noted  . Hypercholesteremia [E78.00] 11/28/2015  . Cerebrovascular disease [I67.9] 11/27/2015  . UTI (urinary tract infection) [N39.0] 11/27/2015  . Delusional disorder ([place]) [F22] 11/24/2015  . Hypothyroidism [E03.9] 11/24/2015   Total Time spent with patient: 30 minutes  Past  Psychiatric History: none  Past Medical History:  Past Medical History  Diagnosis Date  . Kidney stones   . Thyroid disease    History reviewed. No pertinent past surgical history.  Family History:  Family History  Problem Relation Age of Onset  . Family history unknown: Yes   Family Psychiatric  History: denies  Social History: She's been married for 45 years. She is retired now. She has 2 adult sons and several grandchildren whom she loves dearly. She is a [place] woman. History  Alcohol Use No      History  Drug Use No    Social History   Social History  . Marital Status: Married    Spouse Name: N/A  . Number of Children: N/A  . Years of Education: N/A   Social History Main Topics  . Smoking status: Never Smoker   . Smokeless tobacco: Never Used  . Alcohol Use: No  . Drug Use: No  . Sexual Activity: Not Currently   Other Topics Concern  . None   Social History Narrative   Additional Social History:    History of alcohol / drug use?: No history of alcohol / drug abuse   Sleep: Good  Appetite:  Good  Current Medications: Current Facility-Administered Medications  Medication Dose Route Frequency Provider Last Rate Last Dose  . acetaminophen (TYLENOL) tablet 650 mg  650 mg Oral Q6H PRN Gonzella Lex, MD   650 mg at 11/26/15 0527  . alum & mag hydroxide-simeth (MAALOX/MYLANTA) 200-200-20 MG/5ML suspension 30 mL  30 mL Oral Q4H PRN Gonzella Lex, MD   30 mL at 11/27/15 2107  . hydrochlorothiazide (MICROZIDE) capsule 12.5 mg  12.5 mg Oral Daily Hildred Priest, MD   12.5 mg at 11/28/15 0947  . ibuprofen (ADVIL,MOTRIN) tablet 600 mg  600 mg Oral Once Gonzella Lex, MD   600 mg at 11/24/15 2256  . levothyroxine (SYNTHROID, LEVOTHROID) tablet 100 mcg  100 mcg Oral QAC breakfast Hildred Priest, MD   100 mcg at 11/28/15 [id number]  . LORazepam (ATIVAN) tablet 0.5 mg  0.5 mg Oral QHS Hildred Priest, MD   0.5 mg at 11/27/15 2107  . magnesium hydroxide (MILK OF MAGNESIA) suspension 30 mL  30 mL Oral Daily PRN Gonzella Lex, MD   30 mL at 11/26/15 2134  . nitrofurantoin (macrocrystal-monohydrate) (MACROBID) capsule 100 mg  100 mg Oral Q12H Hildred Priest, MD   100 mg at 11/28/15 0946  . pseudoephedrine (SUDAFED) tablet 30 mg  30 mg Oral Q8H PRN Hildred Priest, MD   30 mg at 11/28/15 0620  . risperiDONE (RISPERDAL) tablet 2 mg  2 mg Oral QHS Hildred Priest, MD   2 mg at 11/27/15 2107    Lab Results:  Results for  orders placed or performed during the hospital encounter of 11/24/15 (from the past 48 hour(s))  HIV antibody     Status: None   Collection Time: 11/26/15  3:27 PM  Result Value Ref Range   HIV Screen 4th Generation wRfx Non Reactive Non Reactive    Comment: (NOTE) Performed At: [facility] [place], [place] [id number] Lindon Romp MD [id number]   RPR     Status: None   Collection Time: 11/26/15  3:27 PM  Result Value Ref Range   RPR Ser Ql Non Reactive Non Reactive    Comment: (NOTE) Performed At: [facility] [place], [place] [id number] Lindon Romp MD [id number]   Vitamin  B12     Status: None   Collection Time: 11/26/15  3:27 PM  Result Value Ref Range   Vitamin B-12 279 180 - 914 pg/mL    Comment: (NOTE) This assay is not validated for testing neonatal or myeloproliferative syndrome specimens for Vitamin B12 levels. Performed at [facility]   Ammonia     Status: None   Collection Time: 11/26/15  3:27 PM  Result Value Ref Range   Ammonia 18 9 - 35 umol/L  Urinalysis complete, with microscopic ([facility] only)     Status: Abnormal   Collection Time: 11/26/15  3:46 PM  Result Value Ref Range   Color, Urine STRAW (A) YELLOW   APPearance CLEAR (A) CLEAR   Glucose, UA NEGATIVE NEGATIVE mg/dL   Bilirubin Urine NEGATIVE NEGATIVE   Ketones, ur NEGATIVE NEGATIVE mg/dL   Specific Gravity, Urine 1.005 1.005 - 1.030   Hgb urine dipstick NEGATIVE NEGATIVE   pH 6.0 5.0 - 8.0   Protein, ur NEGATIVE NEGATIVE mg/dL   Nitrite NEGATIVE NEGATIVE   Leukocytes, UA 2+ (A) NEGATIVE   RBC / HPF NONE SEEN 0 - 5 RBC/hpf   WBC, UA 0-5 0 - 5 WBC/hpf   Bacteria, UA NONE SEEN NONE SEEN   Squamous Epithelial / LPF 0-5 (A) NONE SEEN  Urine culture     Status: None   Collection Time: 11/26/15  3:46 PM  Result Value Ref Range   Specimen Description URINE, CLEAN CATCH    Special Requests Normal    Culture MULTIPLE SPECIES PRESENT,  SUGGEST RECOLLECTION    Report Status 11/28/2015 FINAL     Physical Findings: AIMS: Facial and Oral Movements Muscles of Facial Expression: None, normal Lips and Perioral Area: None, normal Jaw: None, normal Tongue: None, normal,Extremity Movements Upper (arms, wrists, hands, fingers): None, normal Lower (legs, knees, ankles, toes): None, normal, Trunk Movements Neck, shoulders, hips: None, normal, Overall Severity Severity of abnormal movements (highest score from questions above): None, normal Incapacitation due to abnormal movements: None, normal Patient's awareness of abnormal movements (rate only patient's report): No Awareness, Dental Status Current problems with teeth and/or dentures?: No Does patient usually wear dentures?: No  CIWA:    COWS:     Musculoskeletal: Strength & Muscle Tone: within normal limits Gait & Station: normal Patient leans: N/A  Psychiatric Specialty Exam: Review of Systems  Constitutional: Negative.   HENT: Negative.   Eyes: Negative.   Respiratory: Negative.   Cardiovascular: Negative.   Gastrointestinal: Negative.   Genitourinary: Positive for dysuria.  Musculoskeletal: Negative.   Skin: Negative.   Neurological: Negative.   Endo/Heme/Allergies: Negative.   Psychiatric/Behavioral: Negative.     Blood pressure 143/102, pulse 87, temperature 97.5 F (36.4 C), temperature source Oral, resp. rate 18, height 5\' 3"  (1.6 m), weight 82.555 kg (182 lb), SpO2 100 %.Body mass index is 32.25 kg/(m^2).  General Appearance: Well Groomed  Engineer, water::  Good  Speech:  Clear and Coherent  Volume:  Normal  Mood:  Euthymic  Affect:  Congruent  Thought Process:  Linear  Orientation:  Full (Time, Place, and Person)  Thought Content:  Hallucinations: None  Suicidal Thoughts:  No  Homicidal Thoughts:  No  Memory:  Immediate;   Good Recent;   Good Remote;   Good  Judgement:  Fair  Insight:  Shallow  Psychomotor Activity:  Normal  Concentration:   Good  Recall:  Good  Fund of Knowledge:Good  Language: Good  Akathisia:  no  Handed:  AIMS (if indicated):     Assets:  Agricultural consultant Housing Physical Health Social Support  ADL's:  Intact  Cognition: WNL  Sleep:  Number of Hours: 6.45   Treatment Plan Summary: Daily contact with patient to assess and evaluate symptoms and progress in treatment and Medication management   The patient is a 65 year old Caucasian female with no known prior psychiatric history who was brought into our hospital due to making suicidal threats. Patient's major stressor is believing that her husband is having an affair. Per the information obtained in the emergency department her family thinks this is delusional in nature.  Delusional disorder: patient has been is started on Risperdal 2 mg by mouth daily at bedtime. This agent was chosen as patient has chronic issues with insomnia. Abilify which was started in the emergency department has been discontinued. Patient took her first dose of Risperdal on 11/26/15 she tolerated it well.  Organic causes of psychosis have been r/o.   Insomnia: patient is slept 6.4 h hours last night. Continue Ativan 0.5 mg qhs  Cerebrovascular disease: this was discussed with pt. She has a PCP in [facility].  Nasal congestion: Patient is requesting pseudoephedrine when necessary for nasal congestion.  I will order pseudoephedrine 30 mg every 8 hours as needed  Hypothyroidism: Continue Synthroid 100 g daily  Edema: continue hydrochlorothiazide 12.5 mg by mouth daily as patient's reports she was taking his medication at home  UTI: Patient complains of UTI symptoms.UA is suggestive of UTI. Culture too young to read.  Continue  Macrobid 100 mg amount twice a day for 6 more days.  Diet regular  Precautions every 15 minute checks  Collateral information will be obtained from the patient's husband, Lyrick Valis [phone],  [phone]  Discharge disposition: Once a stable she will return to her house. Will likely discharge tomorrow  Discharge follow-up: We will arrange follow-up once stable with a psychiatrist.  Labs: HIV, RPR nonreactive, vitamin B12 279. Ammonia level was within the normal limits.  Head CT: 1. Atrophy and small vessel disease. 2. Probable calcified left frontal meningioma 1.1 cm in diameter. 3. No evidence for acute intracranial abnormality. Plan to order an MRI today.  MRI: FLAIR hyperintense foci in the bilateral cerebral white matter with nonspecific pattern, likely early chronic small vessel disease given patient's age.  IMPRESSION: No acute finding or explanation for symptoms. Incidental 1 cm calcified meningioma at the vertex, without brain contact.   Hildred Priest 11/28/2015, 12:13 PM

## 2015-11-29 NOTE — Plan of Care (Signed)
Problem: [facility] Participation in Recreation Therapeutic Interventions Goal: STG-Patient will demonstrate improved self esteem by identif STG: Self-Esteem - Within 4 treatment sessions, patient will verbalize at least 5 positive affirmation statements in each of 2 treatment sessions to increase self-esteem post d/c.  Outcome: Completed/Met Date Met:  11/29/15 Treatment Session 2; Completed 2 out of 2: At approximately 10:15 am, LRT met with patient in patient room. Patient verbalized 5 positive affirmation statements. Patient reported it felt "good". LRT encouraged patient to continue saying positive affirmation statements. Intervention Used: I Am statements  Leonette Monarch, LRT/CTRS 01.05.17 1:19 pm Goal: STG-Patient will identify at least five coping skills for ** STG: Coping Skills - Within 4 treatment sessions, patient will verbalize at least 5 coping skills for anger in each of 2 treatment sessions to increase anger management skills post d/c.  Outcome: Completed/Met Date Met:  11/29/15 Treatment Session 2; Completed 2 out of 2: At approximately 10:15 am, LRT met with patient in patient room. Patient verbalized 5 coping skills for anger. LRT encouraged patient to use her healthy coping skills to help calm herself down.  Intervention Used: Coping Skills worksheet  Leonette Monarch, LRT/CTRS 01.05.17 1:21 pm Goal: STG-Other Recreation Therapy Goal (Specify) STG: Stress Management - Within 4 treatment sessions, patient will verbalize understanding of the stress management techniques in each of 2 treatment sessions to increase stress management skills post d/c.  Outcome: Completed/Met Date Met:  11/29/15 Treatment Session 2; Completed 2 out of 2: At approximately 10:15 am, LRT met with patient in patient room. Patient verbalized understanding of the stress management techniques. LRT encouraged patient to read over and practice the stress management techniques. Intervention Used: Stress  Management handouts  Leonette Monarch, LRT/CTRS 01.05.17 1:22 pm

## 2015-11-29 NOTE — [facility] Suicide Risk Assessment (Signed)
[facility] Discharge Suicide Risk Assessment   Demographic Factors:  Age 65 or older and Caucasian  Total Time spent with patient: 30 minutes   Psychiatric Specialty Exam: Physical Exam  ROS   Has this patient used any form of tobacco in the last 30 days? (Cigarettes, Smokeless Tobacco, Cigars, and/or Pipes) No  Mental Status Per Nursing Assessment::   On Admission:     Current Mental Status by Physician: Denies SI, HI or auditory or visual hallucinations, pleasant, calm, cooperative.  She is still believes her husband has been cheating on her but does not appear distressed or overly concerned as she was at admission.  Loss Factors: Relational problems  Historical Factors: NA  Risk Reduction Factors:   Sense of responsibility to family, Religious beliefs about death, Living with another person, especially a relative, Positive social support and Positive therapeutic relationship  Continued Clinical Symptoms:  Medical Diagnoses and Treatments/Surgeries  Cognitive Features That Contribute To Risk:  None    Suicide Risk:  Minimal: No identifiable suicidal ideation.  Patients presenting with no risk factors but with morbid ruminations; may be classified as minimal risk based on the severity of the depressive symptoms  Principal Problem: Delusional disorder [facility]) Discharge Diagnoses:  Patient Active Problem List   Diagnosis Date Noted  . Hypercholesteremia [E78.00] 11/28/2015  . Cerebrovascular disease [I67.9] 11/27/2015  . UTI (urinary tract infection) [N39.0] 11/27/2015  . Delusional disorder ([place]) [F22] 11/24/2015  . Hypothyroidism [E03.9] 11/24/2015    Follow-up Information    Schedule an appointment as soon as possible for a visit with SPARKS,JEFFREY D, MD.   Specialty:  Internal Medicine   Contact information:   [address] [address] [phone]        Is patient on multiple antipsychotic therapies at discharge:  No    Has Patient had three or more failed trials of antipsychotic monotherapy by history:  No  Recommended Plan for Multiple Antipsychotic Therapies: NA    Hildred Priest 11/29/2015, 10:17 AM

## 2015-11-29 NOTE — Progress Notes (Signed)
Recreation Therapy Notes  INPATIENT RECREATION TR PLAN  Patient Details Name: Susan Myers MRN: [id number] DOB: 08-Aug-1951 Today's Date: 11/29/2015  Rec Therapy Plan Is patient appropriate for Therapeutic Recreation?: Yes Treatment times per week: At least once a week TR Treatment/Interventions: 1:1 session, Group participation (Comment) (Appropriate participation in daily recreation therapy tx)  Discharge Criteria Pt will be discharged from therapy if:: Treatment goals are met, Discharged Treatment plan/goals/alternatives discussed and agreed upon by:: Patient/family  Discharge Summary Short term goals set: See Care Plan Short term goals met: Complete Progress toward goals comments: One-to-one attended Which groups?: Self-esteem One-to-one attended: Self-esteem, stress management, anger management Reason goals not met: N/A Therapeutic equipment acquired: None Reason patient discharged from therapy: Discharge from hospital Pt/family agrees with progress & goals achieved: Yes Date patient discharged from therapy: 11/29/15   Leonette Monarch, LRT/CTRS 11/29/2015, 1:23 PM

## 2015-11-29 NOTE — [facility] Group Notes (Signed)
[facility] Group Notes:  (Nursing/MHT/Case Management/Adjunct)  Date:  11/29/2015  Time:  4:02 AM  Type of Therapy:  Group Therapy  Participation Level:  Minimal  Participation Quality:  Appropriate and Attentive  Affect:  Appropriate  Cognitive:  Alert and Appropriate  Insight:  Appropriate  Engagement in Group:  Engaged  Modes of Intervention:  Discussion  Summary of Progress/Problems:  Nikia Mangino Joy Agustin Swatek 11/29/2015, 4:02 AM

## 2015-11-29 NOTE — Discharge Summary (Signed)
Physician Discharge Summary Note  Patient:  Susan Myers is an 65 y.o., female MRN:  [id number] DOB:  22-Dec-1950 Patient phone:  [phone] (home)  Patient address:   [address] [place] [address],  Total Time spent with patient: 45 minutes  Date of Admission:  11/24/2015 Date of Discharge: 11/29/15  Reason for Admission: Delusions and suicidal threats  Principal Problem: Delusional disorder [facility]) Discharge Diagnoses: Patient Active Problem List   Diagnosis Date Noted  . Hypercholesteremia [E78.00] 11/28/2015  . Cerebrovascular disease [I67.9] 11/27/2015  . UTI (urinary tract infection) [N39.0] 11/27/2015  . Delusional disorder ([place]) [F22] 11/24/2015  . Hypothyroidism [E03.9] 11/24/2015   History of Present Illness:  Identifying data. Mrs. Susan Myers is a 65 year old female with no past psychiatric history.  Chief complaint. "I shouldn't have said it."  History of present illness. Information was obtained from the patient and the chart. The patient reports no past psychiatric history she's never seen a doctor or a therapist. She was per admission by her family for making suicidal threats asking her husband how many Tylenol spelled it would take to kill her and suggesting to use a gun to hurt herself. The patient denies that she had any plans to hurt herself. Rather she was upset with her husband will she believes is unfaithful. She reports that a few years ago he went on a cruise with a woman. She believes that now he is involved with someone else. She believes that he's been using computers and fixing to stay in touch with his paramour. On the day of admission, not only her husband but her 2 adult sons were involved and the family believes that the patient is unsafe. She denies any symptoms of depression, anxiety, or psychosis. She denies symptoms suggestive of bipolar mania. She denies alcohol or illicit substance use. The family reports symptoms suggestive of dysphoric manic  episode or mixed episode of bipolar illness.  Past psychiatric history. Nonreported.  Family psychiatric history. Nonreported.  Social history. She's been married for 45 years. She is retired now. She has 2 adult sons and several grandchildren whom she loves dearly. She is a [place] woman.  Total Time spent with patient: 1 hour  Past Psychiatric History: None reported.   Alcohol Screening: 1. How often do you have a drink containing alcohol?: Never 2. How many drinks containing alcohol do you have on a typical day when you are drinking?: 1 or 2 3. How often do you have six or more drinks on one occasion?: Never Preliminary Score: 0 9. Have you or someone else been injured as a result of your drinking?: No 10. Has a relative or friend or a doctor or another health worker been concerned about your drinking or suggested you cut down?: No Alcohol Use Disorder Identification Test Final Score (AUDIT): 0 Brief Intervention: AUDIT score less than 7 or less-screening does not suggest unhealthy drinking-brief intervention not indicated Substance Abuse History in the last 12 months: No. Consequences of Substance Abuse: NA Previous Psychotropic Medications: No  Psychological Evaluations: No  Past Medical History:  Past Medical History  Diagnosis Date  . Kidney stones   . Thyroid disease    History reviewed. No pertinent past surgical history. Family History:  Family History  Problem Relation Age of Onset  . Family history unknown: Yes   Family Psychiatric History: None reported. Social History:  History  Alcohol Use No    History  Drug Use No    Social History   Social  History  . Marital Status: Married    Spouse Name: N/A  . Number of Children: N/A  . Years of Education: N/A   Social History Main Topics  . Smoking status: Never Smoker   . Smokeless tobacco: Never Used  . Alcohol Use: No  . Drug Use: No  .  Sexual Activity: Not Currently   Other Topics Concern  . None   Social History Narrative   Additional Social History:   History of alcohol / drug use?: No history of alcohol / drug abuse      Allergies:  Allergies  Allergen Reactions  . Amoxicillin Hives  . Levaquin [Levofloxacin In D5w] Hives  . Sulfa Antibiotics Hives         Hospital Course:    The patient is a 65 year old Caucasian female with no known prior psychiatric history who was brought into our hospital due to making suicidal threats. Patient's major stressor is believing that her husband is having an affair. Per the information obtained in the emergency department her family thinks this is delusional in nature.  Delusional disorder: patient has been is started on Risperdal 2 mg by mouth daily at bedtime. This agent was chosen as patient has chronic issues with insomnia. Abilify which was started in the emergency department has been discontinued. Patient took her first dose of Risperdal on 11/26/15 she tolerated it well. Organic causes of psychosis have been r/o.   At discharge patient continued to voice believes that her husband had been cheating on her however she allow communication between the treating team  and her husband, she also allow him to visit, She was willing to return home with him.  She denied any thoughts of suicide, homicide, hopelessness, helplessness, anxiety or depression.     I spoke with the patient's husband on 11/26/15 who reports that the patient has always been suspicious about him having an affair, and this is been going on for about 15 years. He says however that over the last year things have worsened significantly and she is obsessed and constantly preoccupied with it. He tells me she has been accusing him on going on a cruise to [place] but she he is states he's never been to [place] his life. He is frustrated as he does not know what evidence to show her in order to  prove his not having an affair. He says he gives her his phone and acess to his accounts but does not enough. She also accuses him of having some credit card that he doesn't have. He has asked his sons to praying and. Reports that she can see there are no such credit cards but this is again not enough evidence to convince her otherwise.  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  Patient's husband reported that the relationship between the patient and her mother is very difficult as her mother is mean towards the patient.  I spoke with the patient's husband again on discharge. Will review the results of all the tests, diagnosis and treatment planning.  Insomnia: Is sleeping well with Ativan 0.5 mg by mouth daily at bedtime.  Cerebrovascular disease: this was discussed with pt. She has a PCP in [facility].  Hypothyroidism: Continue Synthroid 100 g daily.  TSH was mildly elevated and to follow-up with her primary care  Edema: continue hydrochlorothiazide 12.5 mg by mouth daily as patient's reports she was taking his medication at home  UTI: Patient complains of UTI symptoms.UA is suggestive of UTI. Culture: Contaminated. Continue Macrobid 100 mg amount twice a day for 5 more days.  Collateral information will be obtained from the patient's husband, Clyde Upshaw [phone], [phone]  Discharge disposition: To return to her home with her husband and family.  Discharge follow-up: We will arrange follow-up once stable with a psychiatrist.  Labs: HIV, RPR nonreactive, vitamin B12 279. Ammonia level was within the normal limits.  Head CT: 1. Atrophy and small vessel disease. 2. Probable calcified left frontal meningioma 1.1 cm in diameter. 3. No evidence for acute intracranial abnormality. Plan to order an MRI today.  MRI: FLAIR hyperintense foci in the bilateral cerebral white matter with nonspecific  pattern, likely early chronic small vessel disease given patient's age.  IMPRESSION: No acute finding or explanation for symptoms. Incidental 1 cm calcified meningioma at the vertex, without brain contact.   The patient did not require seclusion, restraints or forced medications. She refuse medications initially but after she was educated about the reasonings for medication management she agreed with taking them.  Patient tolerated medications well. She denied side effects. She denied physical complaints on discharge.   Physical Findings: AIMS: Facial and Oral Movements Muscles of Facial Expression: None, normal Lips and Perioral Area: None, normal Jaw: None, normal Tongue: None, normal,Extremity Movements Upper (arms, wrists, hands, fingers): None, normal Lower (legs, knees, ankles, toes): None, normal, Trunk Movements Neck, shoulders, hips: None, normal, Overall Severity Severity of abnormal movements (highest score from questions above): None, normal Incapacitation due to abnormal movements: None, normal Patient's awareness of abnormal movements (rate only patient's report): No Awareness, Dental Status Current problems with teeth and/or dentures?: No Does patient usually wear dentures?: No  CIWA:    COWS:     Musculoskeletal: Strength & Muscle Tone: within normal limits Gait & Station: normal Patient leans: N/A  Psychiatric Specialty Exam: Review of Systems  Constitutional: Negative.   HENT: Negative.   Eyes: Negative.   Respiratory: Negative.   Cardiovascular: Negative.   Gastrointestinal: Negative.   Genitourinary: Negative.   Musculoskeletal: Negative.   Skin: Negative.   Neurological: Negative.   Endo/Heme/Allergies: Negative.   Psychiatric/Behavioral: Negative.     Blood pressure 112/77, pulse 109, temperature 97.5 F (36.4 C), temperature source Oral, resp. rate 18, height '5\' 3"'$  (1.6 m), weight 82.555 kg (182 lb), SpO2 100 %.Body mass index is 32.25  kg/(m^2).  General Appearance: Well Groomed  Engineer, water::  Good  Speech:  Clear and Coherent  Volume:  Normal  Mood:  Euthymic  Affect:  Congruent  Thought Process:  Linear  Orientation:  Full (Time, Place, and Person)  Thought Content:  Hallucinations: None  Suicidal Thoughts:  No  Homicidal Thoughts:  No  Memory:  Immediate;   Good Recent;   Good Remote;   Good  Judgement:  Impaired  Insight:  Lacking  Psychomotor Activity:  Normal  Concentration:  Good  Recall:  Good  Fund of Knowledge:Good  Language: Good  Akathisia:  No  Handed:    AIMS (if indicated):     Assets:  Agricultural consultant Housing Physical Health Social Support  ADL's:  Intact  Cognition: WNL  Sleep:  Number of Hours: 7    Metabolic Disorder Labs:  Lab Results  Component Value Date   HGBA1C 6.0 11/23/2015   No results found for: PROLACTIN Lab Results  Component Value Date   CHOL 204* 11/23/2015   TRIG 83 11/23/2015   HDL 74  11/23/2015   CHOLHDL 2.8 11/23/2015   VLDL 17 11/23/2015   LDLCALC 113* 11/23/2015    Results for KAYLIEGH, BOYERS (MRN [id number]) as of 11/29/2015 10:20  Ref. Range 11/23/2015 21:03 11/26/2015 15:27  Sodium Latest Ref Range: 135-145 mmol/L 143   Potassium Latest Ref Range: 3.5-5.1 mmol/L 3.4 (L)   Chloride Latest Ref Range: 101-111 mmol/L 105   CO2 Latest Ref Range: 22-32 mmol/L 28   BUN Latest Ref Range: 6-20 mg/dL 20   Creatinine Latest Ref Range: 0.44-1.00 mg/dL 0.92   Calcium Latest Ref Range: 8.9-10.3 mg/dL 9.7   EGFR (Non-African Amer.) Latest Ref Range: >60 mL/min >60   EGFR (African American) Latest Ref Range: >60 mL/min >60   Glucose Latest Ref Range: 65-99 mg/dL 101 (H)   Anion gap Latest Ref Range: 5-15  10   Alkaline Phosphatase Latest Ref Range: 38-126 U/L 88   Albumin Latest Ref Range: 3.5-5.0 g/dL 4.8   AST Latest Ref Range: 15-41 U/L 22   ALT Latest Ref Range: 14-54 U/L 24   Total Protein Latest Ref Range: 6.5-8.1  g/dL 7.5   Ammonia Latest Ref Range: 9-35 umol/L  18  Total Bilirubin Latest Ref Range: 0.3-1.2 mg/dL 0.8   Cholesterol Latest Ref Range: 0-200 mg/dL 204 (H)   Triglycerides Latest Ref Range: <150 mg/dL 83   HDL Cholesterol Latest Ref Range: >40 mg/dL 74   LDL (calc) Latest Ref Range: 0-99 mg/dL 113 (H)   VLDL Latest Ref Range: 0-40 mg/dL 17   Total CHOL/HDL Ratio Latest Units: RATIO 2.8   Vitamin B-12 Latest Ref Range: 180-914 pg/mL  279  WBC Latest Ref Range: 3.6-11.0 K/uL 12.3 (H)   RBC Latest Ref Range: 3.80-5.20 MIL/uL 4.77   Hemoglobin Latest Ref Range: 12.0-16.0 g/dL 14.6   HCT Latest Ref Range: 35.0-47.0 % 44.4   MCV Latest Ref Range: 80.0-100.0 fL 93.2   MCH Latest Ref Range: 26.0-34.0 pg 30.7   MCHC Latest Ref Range: 32.0-36.0 g/dL 32.9   RDW Latest Ref Range: 11.5-14.5 % 13.8   Platelets Latest Ref Range: 026-378 K/uL 588   Salicylate Lvl Latest Ref Range: 2.8-30.0 mg/dL <4.0   Acetaminophen Latest Ref Range: 10-30 ug/mL <10 (L)   Hemoglobin A1C Latest Ref Range: 4.0-6.0 % 6.0   TSH Latest Ref Range: 0.350-4.500 uIU/mL 4.835 (H)   CLINICAL DATA: Delusional thoughts and suicidal ideation  EXAM: CT HEAD WITHOUT CONTRAST  TECHNIQUE: Contiguous axial images were obtained from the base of the skull through the vertex without intravenous contrast.  COMPARISON: None  FINDINGS: There is central and cortical atrophy. Periventricular white matter changes are consistent with small vessel disease. At the left frontal convexity there is a 1.1 cm calcified mass, extra-axial in location and likely representing a benign meningioma. There is no associated mass effect or edema. There is no intra or extra-axial fluid collection. The basilar cisterns and ventricles have a normal appearance. There is no CT evidence for acute infarction or hemorrhage.  IMPRESSION: 1. Atrophy and small vessel disease. 2. Probable calcified left frontal meningioma 1.1 cm in diameter. 3. No  evidence for acute intracranial abnormality.     CLINICAL DATA: New onset delusions.  EXAM: MRI HEAD WITHOUT AND WITH CONTRAST  TECHNIQUE: Multiplanar, multiecho pulse sequences of the brain and surrounding structures were obtained without and with intravenous contrast.  CONTRAST: 52m MULTIHANCE GADOBENATE DIMEGLUMINE 529 MG/ML IV SOLN  COMPARISON: Head CT from yesterday  FINDINGS: Calvarium and upper cervical spine: No focal marrow signal abnormality.  Orbits: No significant findings.  Sinuses and Mastoids: Mucosal thickening/fluid in the bilateral mastoid air cells, small volume. Negative nasopharynx.  Brain: No acute or remote infarct, hemorrhage, hydrocephalus, or mass effect. No extra-axial collection or brain edema. No evidence of large vessel occlusion. FLAIR hyperintense foci in the bilateral cerebral white matter with nonspecific pattern, likely early chronic small vessel disease given patient's age.  Hypo intense extra-axial mass at the vertex measuring 1 cm, with peripheral enhancement, consistent with calcified meningioma. There is no brain contact/mass effect.  IMPRESSION: 1. No acute finding or explanation for symptoms. 2. Incidental 1 cm calcified meningioma at the vertex, without brain contact.    Medication List    TAKE these medications      Indication   hydrochlorothiazide 12.5 MG capsule  Commonly known as:  MICROZIDE  Take 1 capsule (12.5 mg total) by mouth daily.  Notes to Patient:  Blood pressure      levothyroxine 100 MCG tablet  Commonly known as:  SYNTHROID, LEVOTHROID  Take 1 tablet (100 mcg total) by mouth daily before breakfast.  Notes to Patient:  Hypothyroidism      LORazepam 0.5 MG tablet  Commonly known as:  ATIVAN  Take 1 tablet (0.5 mg total) by mouth at bedtime.  Notes to Patient:  Insomnia      nitrofurantoin (macrocrystal-monohydrate) 100 MG capsule  Commonly known as:  MACROBID  Take 1 capsule (100  mg total) by mouth every 12 (twelve) hours.  Notes to Patient:  UTI      risperiDONE 2 MG tablet  Commonly known as:  RISPERDAL  Take 1 tablet (2 mg total) by mouth at bedtime.  Notes to Patient:  Mental health        Follow-up Information    Follow up with SPARKS,JEFFREY D, MD. Go on 12/06/2015.   Specialty:  Internal Medicine   Why:  at 10 :30 am for hospital discharge follow up   Contact information:   [place] [address] [phone]        >30 minutes  Signed: Hildred Priest 11/29/2015, 10:45 AM

## 2015-11-29 NOTE — Progress Notes (Signed)
Pt discharged home. Discharge instructions provided and explained.Medications reviewed. Rx given. All questions answered. Pt stable at dischage. Denies SI, HI, AVH.

## 2015-11-29 NOTE — Tx Team (Signed)
Interdisciplinary Treatment Plan Update (Adult)         Date: 11/29/2015   Time Reviewed: 9:30 AM   Progress in Treatment: Improving Attending groups: Yes  Participating in groups: Yes  Taking medication as prescribed: Yes  Tolerating medication: Yes  Family/Significant other contact made: Yes, CSW has spoken with her husband  Patient understands diagnosis: Yes  Discussing patient identified problems/goals with staff: Yes  Medical problems stabilized or resolved: Yes  Denies suicidal/homicidal ideation: Yes  Issues/concerns per patient self-inventory: Yes  Other:   New problem(s) identified: N/A   Discharge Plan or Barriers: Pt will follow up with outpatient services at [facility] in [facility] for medication management and therapy   Reason for Continuation of Hospitalization:   Depression   Anxiety   Medication Stabilization   Comments: N/A   Estimated date of discharge: 11/29/15    Patient is a 65 year old  female with no past psychiatric history admitted after her family brought her in stating that the pt was making suicidal threats asking her husband how many Tylenol spelled it would take to kill her and suggesting she would to use a gun to hurt herself   The patient denies that she had any plans to hurt herself. Rather she was upset with her husband will she believes is unfaithful. She reports that a few years ago her husband went on a cruise with a woman. She believes that now he is involved with someone else. She believes that he's been using computers and fixing them in order to stay in touch with his paramour. On the day of admission, not only her husband but her 2 adult sons were involved and the family believes that the patient is unsafe. She denies any symptoms of depression, anxiety, or psychosis. She denies symptoms suggestive of bipolar mania. She denies alcohol or illicit substance use. The family reports symptoms suggestive of dysphoric manic episode or mixed episode of  bipolar illness. . Patient lives in [place], [place].  Patient will benefit from crisis stabilization, medication evaluation, group therapy, and psycho education in addition to case management for discharge planning. Patient and CSW reviewed pt's identified goals and treatment plan. Pt verbalized understanding and agreed to treatment plan.    Review of initial/current patient goals per problem list:  1. Goal(s): Patient will participate in aftercare plan   Met: Yes  Target date: 3-5 days post admission date   As evidenced by: Patient will participate within aftercare plan AEB aftercare provider and housing plan at discharge being identified.   1/5: Pt will follow up with outpatient services at [facility] for medication management and therapy.    2. Goal (s): Patient will exhibit decreased depressive symptoms and suicidal ideations.   Met: Yes   Target date: 3-5 days post admission date   As evidenced by: Patient will utilize self-rating of depression at 3 or below and demonstrate decreased signs of depression or be deemed stable for discharge by MD.   1/5: Goal met.  Pt reports baseline symptoms of depression and reports she is safe for discharge.  Pt denies SI    3. Goal(s): Patient will demonstrate decreased signs and symptoms of anxiety.   Met: Yes  Target date: 3-5 days post admission date   As evidenced by: Patient will utilize self-rating of anxiety at 3 or below and demonstrated decreased signs of anxiety, or be deemed stable for discharge by MD   1/5: Goal met.  Pt reports baseline symptoms of anxiety  and rates her anxiety at a zero.  Pt reports she is safe for discharge.   4. Goal(s): Patient will demonstrate decreased signs of psychosis  * Met: Adequate for discharge per MD * Target date: 3-5 days post admission date  * As evidenced by: Patient will demonstrate decreased frequency of AVH or return to baseline function   1/5: Adequate for discharge per  MD.  Pt denies [place]      Attendees:  Patient:  Family:  Physician: Dr. Jerilee Hoh, MD    11/29/2015 9:30 AM  Nursing: , RN       11/29/2015 9:30 AM  Clinical Social Worker: Marylou Flesher, [place]  11/29/2015 9:30 AM  Clinical Social Worker: Carmell Austria, [place]  11/29/2015 9:30 AM  Clinical Social Worker:  [place], [place] 11/29/2015 9:30 AM  Other:        11/29/2015 9:30 AM  Other:        11/29/2015 9:30 AM

## 2015-11-29 NOTE — Progress Notes (Signed)
  [facility] Adult Case Management Discharge Plan :  Will you be returning to the same living situation after discharge:  Yes,  pt will be retuning home to live with her husband At discharge, do you have transportation home?: Yes,  pt will be picked up by her husband Do you have the ability to pay for your medications: Yes,  pt will be provided with prescriptions at discharge  Release of information consent forms completed and in the chart;  Patient's signature needed at discharge.  Patient to Follow up at: Follow-up Information    Follow up with SPARKS,JEFFREY D, MD. Go on 12/06/2015.   Specialty:  Internal Medicine   Why:  at 10 :30 am for hospital discharge follow up   Contact information:   [place] [address] [phone]       Next level of care provider has access to [facility] and Suicide Prevention discussed: Yes,  completed with pt and family     Has patient been referred to the Quitline?: Patient refused referral  Patient has been referred for addiction treatment: N/A  Susan Myers Susan Myers 11/29/2015, 10:55 AM

## 2015-11-29 NOTE — Progress Notes (Signed)
AAOX3. Calm and cooperative. Bright affect. States she's ready to go home. Pt reports that the doctor was discharging her today. Med compliant. Interacting with peers and staff. Attends group. No behavior problems noted. Denies SI/HI/AV/H. No c/o pain/discomfort noted.

## 2015-12-17 DIAGNOSIS — F22 Delusional disorders: Secondary | ICD-10-CM | POA: Diagnosis not present

## 2015-12-17 DIAGNOSIS — E7800 Pure hypercholesterolemia, unspecified: Secondary | ICD-10-CM | POA: Diagnosis not present

## 2015-12-17 DIAGNOSIS — R928 Other abnormal and inconclusive findings on diagnostic imaging of breast: Secondary | ICD-10-CM | POA: Diagnosis not present

## 2015-12-17 DIAGNOSIS — E039 Hypothyroidism, unspecified: Secondary | ICD-10-CM | POA: Diagnosis not present

## 2015-12-17 DIAGNOSIS — B351 Tinea unguium: Secondary | ICD-10-CM | POA: Diagnosis not present

## 2015-12-17 DIAGNOSIS — N390 Urinary tract infection, site not specified: Secondary | ICD-10-CM | POA: Diagnosis not present

## 2015-12-27 ENCOUNTER — Ambulatory Visit: Payer: PPO | Attending: Internal Medicine

## 2016-01-10 ENCOUNTER — Ambulatory Visit
Admission: RE | Admit: 2016-01-10 | Discharge: 2016-01-10 | Disposition: A | Discharge status: Home / Self Care | Payer: PPO | Source: Ambulatory Visit | Attending: Internal Medicine | Admitting: Internal Medicine

## 2016-01-10 DIAGNOSIS — R928 Other abnormal and inconclusive findings on diagnostic imaging of breast: Secondary | ICD-10-CM | POA: Diagnosis not present

## 2016-01-10 DIAGNOSIS — N6489 Other specified disorders of breast: Secondary | ICD-10-CM | POA: Diagnosis not present

## 2016-01-11 ENCOUNTER — Other Ambulatory Visit: Payer: Self-pay | Admitting: Internal Medicine

## 2016-01-11 DIAGNOSIS — N6320 Unspecified lump in the left breast, unspecified quadrant: Secondary | ICD-10-CM

## 2016-01-14 DIAGNOSIS — R5383 Other fatigue: Secondary | ICD-10-CM | POA: Diagnosis not present

## 2016-02-18 DIAGNOSIS — F22 Delusional disorders: Secondary | ICD-10-CM | POA: Diagnosis not present

## 2016-03-04 DIAGNOSIS — F22 Delusional disorders: Secondary | ICD-10-CM | POA: Diagnosis not present

## 2016-04-07 ENCOUNTER — Other Ambulatory Visit: Payer: Self-pay | Admitting: Internal Medicine

## 2016-04-07 DIAGNOSIS — R0600 Dyspnea, unspecified: Secondary | ICD-10-CM | POA: Diagnosis not present

## 2016-04-07 DIAGNOSIS — E039 Hypothyroidism, unspecified: Secondary | ICD-10-CM | POA: Diagnosis not present

## 2016-04-07 DIAGNOSIS — F22 Delusional disorders: Secondary | ICD-10-CM | POA: Diagnosis not present

## 2016-04-07 DIAGNOSIS — R0609 Other forms of dyspnea: Secondary | ICD-10-CM | POA: Diagnosis not present

## 2016-04-07 DIAGNOSIS — K581 Irritable bowel syndrome with constipation: Secondary | ICD-10-CM | POA: Diagnosis not present

## 2016-04-07 DIAGNOSIS — Z79899 Other long term (current) drug therapy: Secondary | ICD-10-CM | POA: Diagnosis not present

## 2016-04-07 DIAGNOSIS — Z87440 Personal history of urinary (tract) infections: Secondary | ICD-10-CM | POA: Diagnosis not present

## 2016-04-07 DIAGNOSIS — M797 Fibromyalgia: Secondary | ICD-10-CM | POA: Diagnosis not present

## 2016-04-07 DIAGNOSIS — E6609 Other obesity due to excess calories: Secondary | ICD-10-CM | POA: Diagnosis not present

## 2016-04-07 DIAGNOSIS — E7800 Pure hypercholesterolemia, unspecified: Secondary | ICD-10-CM | POA: Diagnosis not present

## 2016-04-07 DIAGNOSIS — R5382 Chronic fatigue, unspecified: Secondary | ICD-10-CM | POA: Diagnosis not present

## 2016-04-07 DIAGNOSIS — Z0000 Encounter for general adult medical examination without abnormal findings: Secondary | ICD-10-CM | POA: Diagnosis not present

## 2016-04-07 DIAGNOSIS — N6320 Unspecified lump in the left breast, unspecified quadrant: Secondary | ICD-10-CM

## 2016-04-07 DIAGNOSIS — N63 Unspecified lump in breast: Secondary | ICD-10-CM | POA: Diagnosis not present

## 2016-06-09 DIAGNOSIS — F22 Delusional disorders: Secondary | ICD-10-CM | POA: Diagnosis not present

## 2016-06-24 DIAGNOSIS — I82409 Acute embolism and thrombosis of unspecified deep veins of unspecified lower extremity: Secondary | ICD-10-CM

## 2016-06-24 HISTORY — DX: Acute embolism and thrombosis of unspecified deep veins of unspecified lower extremity: I82.409

## 2016-07-07 DIAGNOSIS — M25512 Pain in left shoulder: Secondary | ICD-10-CM | POA: Diagnosis not present

## 2016-07-07 DIAGNOSIS — G8929 Other chronic pain: Secondary | ICD-10-CM | POA: Diagnosis not present

## 2016-07-07 DIAGNOSIS — M7502 Adhesive capsulitis of left shoulder: Secondary | ICD-10-CM | POA: Diagnosis not present

## 2016-07-07 DIAGNOSIS — M25511 Pain in right shoulder: Secondary | ICD-10-CM | POA: Diagnosis not present

## 2016-07-14 DIAGNOSIS — F22 Delusional disorders: Secondary | ICD-10-CM | POA: Diagnosis not present

## 2016-07-15 ENCOUNTER — Ambulatory Visit
Admission: RE | Admit: 2016-07-15 | Discharge: 2016-07-15 | Disposition: A | Discharge status: Home / Self Care | Payer: PPO | Source: Ambulatory Visit | Attending: Internal Medicine | Admitting: Internal Medicine

## 2016-07-15 DIAGNOSIS — N63 Unspecified lump in breast: Secondary | ICD-10-CM | POA: Diagnosis not present

## 2016-07-15 DIAGNOSIS — N6320 Unspecified lump in the left breast, unspecified quadrant: Secondary | ICD-10-CM

## 2016-07-15 DIAGNOSIS — R928 Other abnormal and inconclusive findings on diagnostic imaging of breast: Secondary | ICD-10-CM | POA: Diagnosis not present

## 2016-07-15 DIAGNOSIS — N6002 Solitary cyst of left breast: Secondary | ICD-10-CM | POA: Diagnosis not present

## 2016-07-24 ENCOUNTER — Ambulatory Visit
Admission: RE | Admit: 2016-07-24 | Discharge: 2016-07-24 | Disposition: A | Discharge status: Home / Self Care | Payer: PPO | Source: Ambulatory Visit | Attending: Physician Assistant | Admitting: Physician Assistant

## 2016-07-24 ENCOUNTER — Other Ambulatory Visit: Payer: Self-pay | Admitting: Physician Assistant

## 2016-07-24 DIAGNOSIS — I82432 Acute embolism and thrombosis of left popliteal vein: Secondary | ICD-10-CM | POA: Diagnosis not present

## 2016-07-24 DIAGNOSIS — M7989 Other specified soft tissue disorders: Secondary | ICD-10-CM

## 2016-07-24 DIAGNOSIS — I82412 Acute embolism and thrombosis of left femoral vein: Secondary | ICD-10-CM | POA: Diagnosis not present

## 2016-07-24 DIAGNOSIS — I82442 Acute embolism and thrombosis of left tibial vein: Secondary | ICD-10-CM | POA: Diagnosis not present

## 2016-07-24 DIAGNOSIS — I82402 Acute embolism and thrombosis of unspecified deep veins of left lower extremity: Secondary | ICD-10-CM | POA: Diagnosis not present

## 2016-08-04 ENCOUNTER — Other Ambulatory Visit: Payer: Self-pay | Admitting: Vascular Surgery

## 2016-08-04 DIAGNOSIS — M79609 Pain in unspecified limb: Secondary | ICD-10-CM | POA: Diagnosis not present

## 2016-08-04 DIAGNOSIS — I824Y2 Acute embolism and thrombosis of unspecified deep veins of left proximal lower extremity: Secondary | ICD-10-CM | POA: Diagnosis not present

## 2016-08-04 DIAGNOSIS — I890 Lymphedema, not elsewhere classified: Secondary | ICD-10-CM | POA: Diagnosis not present

## 2016-08-04 DIAGNOSIS — E669 Obesity, unspecified: Secondary | ICD-10-CM | POA: Diagnosis not present

## 2016-08-04 DIAGNOSIS — E785 Hyperlipidemia, unspecified: Secondary | ICD-10-CM | POA: Diagnosis not present

## 2016-08-04 DIAGNOSIS — M7989 Other specified soft tissue disorders: Secondary | ICD-10-CM | POA: Diagnosis not present

## 2016-08-04 DIAGNOSIS — I87092 Postthrombotic syndrome with other complications of left lower extremity: Secondary | ICD-10-CM | POA: Diagnosis not present

## 2016-08-06 ENCOUNTER — Observation Stay
Admission: AD | Admit: 2016-08-06 | Discharge: 2016-08-07 | Disposition: A | Discharge status: Home / Self Care | Payer: PPO | Source: Ambulatory Visit | Attending: Vascular Surgery | Admitting: Vascular Surgery

## 2016-08-06 ENCOUNTER — Encounter
Admission: AD | Disposition: A | Discharge status: Home / Self Care | Payer: Self-pay | Source: Ambulatory Visit | Attending: Vascular Surgery

## 2016-08-06 DIAGNOSIS — I82401 Acute embolism and thrombosis of unspecified deep veins of right lower extremity: Secondary | ICD-10-CM | POA: Diagnosis not present

## 2016-08-06 DIAGNOSIS — I82402 Acute embolism and thrombosis of unspecified deep veins of left lower extremity: Secondary | ICD-10-CM | POA: Diagnosis not present

## 2016-08-06 DIAGNOSIS — Z87442 Personal history of urinary calculi: Secondary | ICD-10-CM | POA: Insufficient documentation

## 2016-08-06 DIAGNOSIS — I82432 Acute embolism and thrombosis of left popliteal vein: Principal | ICD-10-CM | POA: Insufficient documentation

## 2016-08-06 DIAGNOSIS — I890 Lymphedema, not elsewhere classified: Secondary | ICD-10-CM | POA: Diagnosis not present

## 2016-08-06 DIAGNOSIS — E079 Disorder of thyroid, unspecified: Secondary | ICD-10-CM | POA: Insufficient documentation

## 2016-08-06 DIAGNOSIS — C569 Malignant neoplasm of unspecified ovary: Secondary | ICD-10-CM | POA: Insufficient documentation

## 2016-08-06 DIAGNOSIS — E7800 Pure hypercholesterolemia, unspecified: Secondary | ICD-10-CM | POA: Insufficient documentation

## 2016-08-06 DIAGNOSIS — I82492 Acute embolism and thrombosis of other specified deep vein of left lower extremity: Secondary | ICD-10-CM | POA: Insufficient documentation

## 2016-08-06 DIAGNOSIS — E785 Hyperlipidemia, unspecified: Secondary | ICD-10-CM | POA: Diagnosis not present

## 2016-08-06 DIAGNOSIS — Z79899 Other long term (current) drug therapy: Secondary | ICD-10-CM | POA: Insufficient documentation

## 2016-08-06 DIAGNOSIS — I82409 Acute embolism and thrombosis of unspecified deep veins of unspecified lower extremity: Secondary | ICD-10-CM | POA: Diagnosis present

## 2016-08-06 DIAGNOSIS — I82509 Chronic embolism and thrombosis of unspecified deep veins of unspecified lower extremity: Secondary | ICD-10-CM | POA: Diagnosis present

## 2016-08-06 DIAGNOSIS — Z7901 Long term (current) use of anticoagulants: Secondary | ICD-10-CM | POA: Insufficient documentation

## 2016-08-06 DIAGNOSIS — I82422 Acute embolism and thrombosis of left iliac vein: Secondary | ICD-10-CM | POA: Insufficient documentation

## 2016-08-06 DIAGNOSIS — Z882 Allergy status to sulfonamides status: Secondary | ICD-10-CM | POA: Diagnosis not present

## 2016-08-06 DIAGNOSIS — I824Y2 Acute embolism and thrombosis of unspecified deep veins of left proximal lower extremity: Secondary | ICD-10-CM | POA: Diagnosis not present

## 2016-08-06 DIAGNOSIS — Z8249 Family history of ischemic heart disease and other diseases of the circulatory system: Secondary | ICD-10-CM | POA: Insufficient documentation

## 2016-08-06 DIAGNOSIS — M79609 Pain in unspecified limb: Secondary | ICD-10-CM | POA: Diagnosis not present

## 2016-08-06 DIAGNOSIS — E669 Obesity, unspecified: Secondary | ICD-10-CM | POA: Diagnosis not present

## 2016-08-06 DIAGNOSIS — I87092 Postthrombotic syndrome with other complications of left lower extremity: Secondary | ICD-10-CM | POA: Diagnosis not present

## 2016-08-06 DIAGNOSIS — I2699 Other pulmonary embolism without acute cor pulmonale: Secondary | ICD-10-CM | POA: Diagnosis not present

## 2016-08-06 DIAGNOSIS — M7989 Other specified soft tissue disorders: Secondary | ICD-10-CM | POA: Diagnosis not present

## 2016-08-06 DIAGNOSIS — I82412 Acute embolism and thrombosis of left femoral vein: Secondary | ICD-10-CM | POA: Insufficient documentation

## 2016-08-06 HISTORY — DX: Pure hypercholesterolemia, unspecified: E78.00

## 2016-08-06 HISTORY — DX: Acute embolism and thrombosis of unspecified deep veins of unspecified lower extremity: I82.409

## 2016-08-06 HISTORY — PX: PERIPHERAL VASCULAR CATHETERIZATION: SHX172C

## 2016-08-06 LAB — CBC
HEMATOCRIT: 43 % (ref 35.0–47.0)
Hemoglobin: 14.2 g/dL (ref 12.0–16.0)
MCH: 30.6 pg (ref 26.0–34.0)
MCHC: 33 g/dL (ref 32.0–36.0)
MCV: 92.8 fL (ref 80.0–100.0)
PLATELETS: 340 10*3/uL (ref 150–440)
RBC: 4.63 MIL/uL (ref 3.80–5.20)
RDW: 14.4 % (ref 11.5–14.5)
WBC: 10.5 10*3/uL (ref 3.6–11.0)

## 2016-08-06 LAB — APTT: aPTT: 105 seconds — ABNORMAL HIGH (ref 24–36)

## 2016-08-06 LAB — CREATININE, SERUM: CREATININE: 0.88 mg/dL (ref 0.44–1.00)

## 2016-08-06 LAB — BUN: BUN: 23 mg/dL — AB (ref 6–20)

## 2016-08-06 LAB — PROTIME-INR
INR: 1.33
Prothrombin Time: 16.6 seconds — ABNORMAL HIGH (ref 11.4–15.2)

## 2016-08-06 SURGERY — IVC FILTER INSERTION
Anesthesia: Moderate Sedation

## 2016-08-06 MED ORDER — MIDAZOLAM HCL 2 MG/2ML IJ SOLN
INTRAMUSCULAR | Status: DC | PRN
  Administered 2016-08-06: 1 mg via INTRAVENOUS
  Administered 2016-08-06: 2 mg via INTRAVENOUS
  Administered 2016-08-06 (×2): 1 mg via INTRAVENOUS
  Administered 2016-08-06: 2 mg via INTRAVENOUS

## 2016-08-06 MED ORDER — FAMOTIDINE 20 MG PO TABS: 40.0000 mg | ORAL_TABLET | ORAL | Status: DC | PRN

## 2016-08-06 MED ORDER — HEPARIN SODIUM (PORCINE) 1000 UNIT/ML IJ SOLN
INTRAMUSCULAR | Status: AC
  Filled 2016-08-06: qty 1

## 2016-08-06 MED ORDER — ALTEPLASE 2 MG IJ SOLR
INTRAMUSCULAR | Status: DC | PRN
  Administered 2016-08-06: 12 mg

## 2016-08-06 MED ORDER — PANTOPRAZOLE SODIUM 40 MG PO TBEC: 40.0000 mg | DELAYED_RELEASE_TABLET | Freq: Every day | ORAL | Status: DC

## 2016-08-06 MED ORDER — NITROFURANTOIN MONOHYD MACRO 100 MG PO CAPS: 100.0000 mg | ORAL_CAPSULE | Freq: Two times a day (BID) | ORAL | Status: DC

## 2016-08-06 MED ORDER — MIDAZOLAM HCL 2 MG/2ML IJ SOLN
INTRAMUSCULAR | Status: AC
  Filled 2016-08-06: qty 2

## 2016-08-06 MED ORDER — CLINDAMYCIN PHOSPHATE 300 MG/50ML IV SOLN
300.0000 mg | Freq: Once | INTRAVENOUS | Status: AC
  Administered 2016-08-06: 300 mg via INTRAVENOUS

## 2016-08-06 MED ORDER — ONDANSETRON HCL 4 MG/2ML IJ SOLN
INTRAMUSCULAR | Status: AC
  Administered 2016-08-06: 18:00:00
  Filled 2016-08-06: qty 2

## 2016-08-06 MED ORDER — HEPARIN (PORCINE) IN NACL 100-0.45 UNIT/ML-% IJ SOLN
1200.0000 [IU]/h | INTRAMUSCULAR | Status: DC
  Administered 2016-08-06: 1200 [IU]/h via INTRAVENOUS
  Filled 2016-08-06: qty 250

## 2016-08-06 MED ORDER — ALUM & MAG HYDROXIDE-SIMETH 200-200-20 MG/5ML PO SUSP: 15.0000 mL | ORAL | Status: DC | PRN

## 2016-08-06 MED ORDER — ACETAMINOPHEN 325 MG PO TABS: 325.0000 mg | ORAL_TABLET | ORAL | Status: DC | PRN

## 2016-08-06 MED ORDER — ONDANSETRON HCL 4 MG/2ML IJ SOLN: 4.0000 mg | Freq: Four times a day (QID) | INTRAMUSCULAR | Status: DC | PRN

## 2016-08-06 MED ORDER — SODIUM CHLORIDE 0.9 % IV SOLN
INTRAVENOUS | Status: DC
  Administered 2016-08-06: 17:00:00 via INTRAVENOUS

## 2016-08-06 MED ORDER — HYDROCHLOROTHIAZIDE 12.5 MG PO CAPS: 12.5000 mg | ORAL_CAPSULE | Freq: Every day | ORAL | Status: DC

## 2016-08-06 MED ORDER — FENTANYL CITRATE (PF) 100 MCG/2ML IJ SOLN
INTRAMUSCULAR | Status: AC
  Filled 2016-08-06: qty 2

## 2016-08-06 MED ORDER — SODIUM CHLORIDE 0.9 % IV SOLN
INTRAVENOUS | Status: AC
  Administered 2016-08-06 – 2016-08-07 (×2): via INTRAVENOUS

## 2016-08-06 MED ORDER — ACETAMINOPHEN 325 MG RE SUPP
325.0000 mg | RECTAL | Status: DC | PRN
  Filled 2016-08-06: qty 2

## 2016-08-06 MED ORDER — METHYLPREDNISOLONE SODIUM SUCC 125 MG IJ SOLR: 125.0000 mg | INTRAMUSCULAR | Status: DC | PRN

## 2016-08-06 MED ORDER — OXYCODONE HCL 5 MG PO TABS: 5.0000 mg | ORAL_TABLET | ORAL | Status: DC | PRN

## 2016-08-06 MED ORDER — RISPERIDONE 1 MG PO TABS
2.0000 mg | ORAL_TABLET | Freq: Every day | ORAL | Status: DC
  Filled 2016-08-06 (×2): qty 1
  Filled 2016-08-06: qty 2

## 2016-08-06 MED ORDER — IOPAMIDOL (ISOVUE-300) INJECTION 61%
INTRAVENOUS | Status: DC | PRN
Start: 2016-08-06 — End: 2016-08-06
  Administered 2016-08-06: 60 mL via INTRAVENOUS

## 2016-08-06 MED ORDER — CLINDAMYCIN PHOSPHATE 300 MG/50ML IV SOLN
INTRAVENOUS | Status: AC
  Filled 2016-08-06: qty 50

## 2016-08-06 MED ORDER — HYDROMORPHONE HCL 1 MG/ML IJ SOLN: 1.0000 mg | Freq: Once | INTRAMUSCULAR | Status: DC

## 2016-08-06 MED ORDER — LIDOCAINE HCL (PF) 1 % IJ SOLN
INTRAMUSCULAR | Status: AC
  Filled 2016-08-06: qty 30

## 2016-08-06 MED ORDER — MORPHINE SULFATE (PF) 4 MG/ML IV SOLN: 2.0000 mg | INTRAVENOUS | Status: DC | PRN

## 2016-08-06 MED ORDER — HEPARIN (PORCINE) IN NACL 100-0.45 UNIT/ML-% IJ SOLN
INTRAMUSCULAR | Status: AC
  Filled 2016-08-06: qty 250

## 2016-08-06 MED ORDER — MIDAZOLAM HCL 2 MG/2ML IJ SOLN
INTRAMUSCULAR | Status: AC
  Filled 2016-08-06: qty 4

## 2016-08-06 MED ORDER — HEPARIN SODIUM (PORCINE) 1000 UNIT/ML IJ SOLN
INTRAMUSCULAR | Status: DC | PRN
  Administered 2016-08-06: 5000 [IU] via INTRAVENOUS

## 2016-08-06 MED ORDER — LORAZEPAM 0.5 MG PO TABS: 0.5000 mg | ORAL_TABLET | Freq: Every day | ORAL | Status: DC

## 2016-08-06 MED ORDER — ZOLPIDEM TARTRATE 5 MG PO TABS: 5.0000 mg | ORAL_TABLET | Freq: Every evening | ORAL | Status: DC | PRN

## 2016-08-06 MED ORDER — FENTANYL CITRATE (PF) 100 MCG/2ML IJ SOLN
INTRAMUSCULAR | Status: DC | PRN
  Administered 2016-08-06 (×5): 50 ug via INTRAVENOUS

## 2016-08-06 MED ORDER — ALTEPLASE 2 MG IJ SOLR
INTRAMUSCULAR | Status: AC
  Filled 2016-08-06: qty 12

## 2016-08-06 MED ORDER — DOCUSATE SODIUM 100 MG PO CAPS: 100.0000 mg | ORAL_CAPSULE | Freq: Every day | ORAL | Status: DC

## 2016-08-06 MED ORDER — LEVOTHYROXINE SODIUM 100 MCG PO TABS
100.0000 ug | ORAL_TABLET | Freq: Every day | ORAL | Status: DC
  Administered 2016-08-07: 08:00:00 100 ug via ORAL
  Filled 2016-08-06: qty 1

## 2016-08-06 SURGICAL SUPPLY — 14 items
BALLN DORADO 7X200X80 (BALLOONS) ×6
BALLOON DORADO 7X200X80 (BALLOONS) ×1 IMPLANT
CATH KA2 5FR 65CM (CATHETERS) ×3 IMPLANT
CATH PIG 70CM (CATHETERS) ×6 IMPLANT
DEVICE PRESTO INFLATION (MISCELLANEOUS) ×3 IMPLANT
DEVICE TORQUE (MISCELLANEOUS) ×3 IMPLANT
FILTER VC CELECT-FEMORAL (Filter) ×3 IMPLANT
GLIDEWIRE ANGLED SS 035X260CM (WIRE) ×3 IMPLANT
PACK ANGIOGRAPHY (CUSTOM PROCEDURE TRAY) ×6 IMPLANT
SET INTRO CAPELLA COAXIAL (SET/KITS/TRAYS/PACK) ×3 IMPLANT
SET ZELANTE DVT THROMB (CATHETERS) ×3 IMPLANT
SHEATH BRITE TIP 8FRX11 (SHEATH) ×3 IMPLANT
WIRE J 3MM .035X145CM (WIRE) ×6 IMPLANT
WIRE MAGIC TOR.035 180C (WIRE) ×3 IMPLANT

## 2016-08-06 NOTE — OR Nursing (Signed)
Left leg elevated high than level of heart as instructed by Dr Delana Meyer.

## 2016-08-06 NOTE — Progress Notes (Signed)
Foley cath inserted per patient request.  Return of pink tinged urine.  Patient tolerated procedure well without any complaints of pain.  No distress noted.

## 2016-08-06 NOTE — Progress Notes (Signed)
ANTICOAGULATION CONSULT NOTE - Initial Consult  Pharmacy Consult for heparin Indication: DVT  Allergies  Allergen Reactions  . Amoxicillin Hives  . Levaquin [Levofloxacin In D5w] Hives  . Sulfa Antibiotics Hives    Patient Measurements: Height: 5' 2.5" (158.8 cm) Weight: 200 lb (90.7 kg) IBW/kg (Calculated) : 51.25 Heparin Dosing Weight: 70.8 kg  Vital Signs: Temp: 98.2 F (36.8 C) (09/13 1428) Temp Source: Oral (09/13 1428) BP: 127/116 (09/13 1927) Pulse Rate: 89 (09/13 1927)  Labs:  Recent Labs  08/06/16 1426  CREATININE 0.88    Estimated Creatinine Clearance: 67.5 mL/min (by C-G formula based on SCr of 0.88 mg/dL).   Medical History: Past Medical History:  Diagnosis Date  . DVT (deep venous thrombosis) ([place])   . Hypercholesterolemia   . Kidney stones   . Thyroid disease     Assessment: 65 yo female with acute DVT. Pharmacy consulted for heparin dosing and monitoring.   Goal of Therapy:  Heparin level 0.3-0.7 units/ml Monitor platelets by anticoagulation protocol: Yes   Plan:  DW: 70.8 kg Give 4000 units bolus x 1 Start heparin infusion at 1200 units/hr Check anti-Xa level in 6 hours and daily while on heparin Continue to monitor H&H and platelets  (Bolus was received in Special recovery per Dr. Delana Meyer)   Nancy Fetter, PharmD Clinical Pharmacist 08/06/2016 7:48 PM

## 2016-08-06 NOTE — H&P (Signed)
[facility] History & Physical Update  The patient was interviewed and re-examined.  The patient's previous History and Physical has been reviewed and is unchanged.  There is no change in the plan of care. We plan to proceed with the scheduled procedure.  Schnier, Dolores Lory, MD  08/06/2016, 5:02 PM

## 2016-08-06 NOTE — OR Nursing (Signed)
Pharmacy called for heparin dose, need coags amnd cbc results first, to be drawn, Lab called for stat draw, they have cbc tube which was drawn earlier pre procedure and will run that now.

## 2016-08-06 NOTE — OR Nursing (Signed)
Foley draining red urine, as to be expected post TPA, Rn, patient and family aware.

## 2016-08-06 NOTE — Op Note (Signed)
[facility]   OPERATIVE NOTE    PRE-OPERATIVE DIAGNOSIS: Symptomatic left leg DVT; right leg DVT; bilateral pulmonary embolism; ovarian cancer  POST-OPERATIVE DIAGNOSIS: Same  PROCEDURE: 1. Ultrasound guidance for vascular access to the left popliteal vein 2. Introduction catheter into venous system second order catheter placement left popliteal approach 3. Inferior venacavogram with venography of the left lower extremity 4. Placement of a Ceslect IVC filter infrarenal 5. Percutaneous transluminal angioplasty femoral venous system and iliac venous system 6. Infusion thrombolysis with 16 mg of TPA 7. Mechanical thrombectomy of the popliteal, SFV common femoral vein using the Zelante catheter  SURGEON: Schnier, Dolores Lory  ASSISTANT(S): None  ANESTHESIA: local/sedation  ESTIMATED BLOOD LOSS: minimal  Contrast: 60 cc  Fluoroscopy time: 14.5 minutes  FINDING(S): 1. Patent IVC; acute and chronic thrombus within the left popliteal, superficial femoral vein and common femoral vein as well as the external iliac and common iliac veins on the left  SPECIMEN(S): none  INDICATIONS:  Susan Myers is a 65 y.o. year old female who presents with massive swelling of the left leg quite painful in association with pleuritic chest pains and hypoxia as well as shortness of breath. Inferior vena cava filter is indicated for this reason. Risks and benefits including filter thrombosis, migration, fracture, bleeding, and infection were all discussed. We discussed that all IVC filters that we place can be removed if desired from the patient once the need for the filter has passed.   DESCRIPTION: After obtaining full informed written consent, the patient was brought back to the vascular suite. She was placed in the prone position The skin of the left brachial fossa was sterilely prepped and draped in a sterile surgical field was created. The left  popliteal vein was accessed under direct ultrasound guidance without difficulty with a Seldinger needle and a J-wire was then placed. 8 French sheath is then placed. Small injection contrast is used to verify intraluminal position as well as thrombus within the vein. Using a combination of a KMP catheter and a Glidewire wire catheter combination is negotiated with moderate difficulty into the inferior vena cava. A moderate difficulty extends from the fact that there are obvious acute on chronic changes with extensive tributaries consistent with long-standing obstructive disease and collateral formation. Once the wire has been advanced into the inferior vena cava hand injection contrast through the KMP catheter was then utilized to verify intraluminal placement. The wires reintroduced and the delivery sheath for the IVC filter is advanced over the wire and positioned within the inferior vena cava. Inferior venacavogram was performed. This demonstrated a patent IVC with the level of the renal veins at L1-L2. The filter was then deployed into the inferior vena cava at the level of inferior margin of L2 just below the renal veins. The delivery sheath was then removed and the 8 French sheath replaced over the wire.  Zelonte catheter is then prepped on the field and 16 mg of TPA is reconstituted 100 cc. This is then laced throughout the common femoral superficial femoral and popliteal veins. The TPA is then allowed to dwell for approximately 30 minutes. Subsequently, the AngioJet catheter is engaged in the aspiration mode and aspiration of the common femoral as well as the popliteal and superficial femoral veins is performed multiple passes are made with the volumes recorded in the procedure notes.  Approximately 200 cc of aspirate is obtained.  Follow-up imaging now demonstrates that a large amount of thrombus has been eliminated from  the proximal popliteal as well as the superficial femoral vein. There does appear  to be multiple strictures of the femoral and iliac venous system. This is consistent with the difficulty in advancing the Glidewire earlier in the case.. Within the common femoral vein there is a high-grade residual narrowing and therefore initially a 7 x 200 Dorado balloon was inflated to 16 atm for 1 minute beginning in the distal SFV and in serial fashion moving all the way to the IVC. A total of 3 inflations were performed each 1 for 1 full minute. Follow-up imaging demonstrates there is some residual thrombus and stricture but a marked improvement throughout the venous system  A magnified image of the IVC and filter demonstrates that the IVC and the filter remained widely patent with no evidence of thrombus within the filter. Given that the patient now is widely patent with minimal residual thrombus from the distal popliteal to the IVC the procedure is terminated. The wires removed the sheath is removed pressures held and a pressure dressing with Coban and is then applied. The patient is then returned to the supine position  Interpretation: Initial images demonstrated normal vena cava filter is placed without difficulty. Initial images the left lower extremity then demonstrated extensive thrombus throughout the popliteal and femoral veins and the iliac veins remained widely patent. Following intervention described above there is marked improvement with a significant reduction of thrombus throughout.  COMPLICATIONS: None  CONDITION: Stable  Schnier, Gregory G  08/06/2016,7:44 PM   

## 2016-08-07 ENCOUNTER — Encounter: Payer: Self-pay | Admitting: Vascular Surgery

## 2016-08-07 DIAGNOSIS — M79609 Pain in unspecified limb: Secondary | ICD-10-CM | POA: Diagnosis not present

## 2016-08-07 DIAGNOSIS — I82432 Acute embolism and thrombosis of left popliteal vein: Secondary | ICD-10-CM | POA: Diagnosis not present

## 2016-08-07 DIAGNOSIS — E785 Hyperlipidemia, unspecified: Secondary | ICD-10-CM | POA: Diagnosis not present

## 2016-08-07 DIAGNOSIS — I87092 Postthrombotic syndrome with other complications of left lower extremity: Secondary | ICD-10-CM | POA: Diagnosis not present

## 2016-08-07 DIAGNOSIS — I82402 Acute embolism and thrombosis of unspecified deep veins of left lower extremity: Secondary | ICD-10-CM | POA: Diagnosis not present

## 2016-08-07 DIAGNOSIS — I82409 Acute embolism and thrombosis of unspecified deep veins of unspecified lower extremity: Secondary | ICD-10-CM | POA: Diagnosis present

## 2016-08-07 DIAGNOSIS — E669 Obesity, unspecified: Secondary | ICD-10-CM | POA: Diagnosis not present

## 2016-08-07 DIAGNOSIS — M7989 Other specified soft tissue disorders: Secondary | ICD-10-CM | POA: Diagnosis not present

## 2016-08-07 DIAGNOSIS — I82401 Acute embolism and thrombosis of unspecified deep veins of right lower extremity: Secondary | ICD-10-CM | POA: Diagnosis not present

## 2016-08-07 DIAGNOSIS — I890 Lymphedema, not elsewhere classified: Secondary | ICD-10-CM | POA: Diagnosis not present

## 2016-08-07 DIAGNOSIS — I824Y2 Acute embolism and thrombosis of unspecified deep veins of left proximal lower extremity: Secondary | ICD-10-CM | POA: Diagnosis not present

## 2016-08-07 LAB — HEPARIN LEVEL (UNFRACTIONATED)
HEPARIN UNFRACTIONATED: 1.19 [IU]/mL — AB (ref 0.30–0.70)
Heparin Unfractionated: 0.62 IU/mL (ref 0.30–0.70)

## 2016-08-07 MED ORDER — RIVAROXABAN 15 MG PO TABS
15.0000 mg | ORAL_TABLET | ORAL | Status: AC
  Administered 2016-08-07: 15 mg via ORAL
  Filled 2016-08-07: qty 1

## 2016-08-07 MED ORDER — HEPARIN (PORCINE) IN NACL 100-0.45 UNIT/ML-% IJ SOLN
1000.0000 [IU]/h | INTRAMUSCULAR | Status: DC
  Administered 2016-08-07: 04:00:00 1000 [IU]/h via INTRAVENOUS
  Filled 2016-08-07: qty 250

## 2016-08-07 NOTE — Discharge Summary (Signed)
  [facility]    Discharge Summary    Patient ID:  Susan Myers MRN: [id number] DOB/AGE: 1951-03-04 65 y.o.  Admit date: 08/06/2016 Discharge date: 08/07/2016 Date of Surgery: 08/06/2016 Surgeon: Surgeon(s): Katha Cabal, MD  Admission Diagnosis: LT leg angio and IVC filter placement  Discharge Diagnoses:  LT leg angio and IVC filter placement  Secondary Diagnoses: Past Medical History:  Diagnosis Date  . DVT (deep venous thrombosis) ([place])   . Hypercholesterolemia   . Kidney stones   . Thyroid disease     Procedure(s): IVC Filter Insertion Lower Extremity Venography Lower Extremity Intervention  Discharged Condition: good  HPI:  Patient presented to the office this past Monday with increased pain and swelling and the findings on duplex ultrasound of DVT in the left lower extremity. Patient has a remote history of extensive DVT. Because she was so symptomatic intervention was reviewed all questions were answered the patient is elected to proceed.  Hospital course:  On the day of admission the patient underwent successful intervention with placement of an IVC filter and subsequent TPA infusion mechanical thrombectomy of the left lower extremity venous system. Angioplasty of the femoral as well as iliac veins was performed successfully. Patient was initiated on a heparin drip and observed overnight. Today she is doing well her leg is much less painful and actually is only slightly larger than her right leg.  Hospital Course:  Susan Myers is a 65 y.o. female is S/P Left Procedure(s): IVC Filter Insertion Lower Extremity Venography Lower Extremity Intervention Extubated: POD # 0 Physical exam: Left leg 1+ edema dressing is removed puncture site is clean and intact Post-op wounds clean, dry, intact or healing well Pt. Ambulating, voiding and taking PO diet without difficulty. Pt pain controlled with PO pain meds. Labs as  below Complications:none  Consults:    Significant Diagnostic Studies: CBC Lab Results  Component Value Date   WBC 10.5 08/06/2016   HGB 14.2 08/06/2016   HCT 43.0 08/06/2016   MCV 92.8 08/06/2016   PLT 340 08/06/2016    BMET    Component Value Date/Time   NA 143 11/23/2015 2103   K 3.4 (L) 11/23/2015 2103   CL 105 11/23/2015 2103   CO2 28 11/23/2015 2103   GLUCOSE 101 (H) 11/23/2015 2103   BUN 23 (H) 08/06/2016 1426   CREATININE 0.88 08/06/2016 1426   CALCIUM 9.7 11/23/2015 2103   GFRNONAA >60 08/06/2016 1426   GFRAA >60 08/06/2016 1426   COAG Lab Results  Component Value Date   INR 1.33 08/06/2016     Disposition:  Discharge to :Home   Verbal and written Discharge instructions given to the patient. Wound care per Discharge AVS   Signed: Katha Cabal, MD  08/07/2016, 2:22 PM

## 2016-08-07 NOTE — Progress Notes (Signed)
ANTICOAGULATION CONSULT NOTE - Initial Consult  Pharmacy Consult for heparin Indication: DVT  Allergies  Allergen Reactions  . Amoxicillin Hives  . Levaquin [Levofloxacin In D5w] Hives  . Sulfa Antibiotics Hives    Patient Measurements: Height: 5\' 2"  (157.5 cm) Weight: 195 lb (88.5 kg) IBW/kg (Calculated) : 50.1 Heparin Dosing Weight: 70.8 kg  Vital Signs: Temp: 98.4 F (36.9 C) (09/13 2040) Temp Source: Oral (09/13 2040) BP: 126/91 (09/13 2040) Pulse Rate: 82 (09/13 2040)  Labs:  Recent Labs  08/06/16 1426 08/06/16 2152 08/07/16 0156  HGB 14.2  --   --   HCT 43.0  --   --   PLT 340  --   --   APTT  --  105*  --   LABPROT  --  16.6*  --   INR  --  1.33  --   HEPARINUNFRC  --   --  1.19*  CREATININE 0.88  --   --     Estimated Creatinine Clearance: 65.9 mL/min (by C-G formula based on SCr of 0.88 mg/dL).   Medical History: Past Medical History:  Diagnosis Date  . DVT (deep venous thrombosis) ([place])   . Hypercholesterolemia   . Kidney stones   . Thyroid disease     Assessment: 65 yo female with acute DVT. Pharmacy consulted for heparin dosing and monitoring.   Goal of Therapy:  Heparin level 0.3-0.7 units/ml Monitor platelets by anticoagulation protocol: Yes   Plan:  DW: 70.8 kg Give 4000 units bolus x 1 Start heparin infusion at 1200 units/hr Check anti-Xa level in 6 hours and daily while on heparin Continue to monitor H&H and platelets  (Bolus was received in Special recovery per Dr. Delana Meyer)   08/07/16 0156 HL supratherapeutic. Asked RN to hold x 1 hr, then resume at 1000 units/hr. Will recheck level 6 hours after restarting infusion.  Shamus Desantis A. [place], [place].D., BCPS Clinical Pharmacist 08/07/2016 3:14 AM

## 2016-08-07 NOTE — Progress Notes (Addendum)
Discharge paperwork reviewed with patient who verbalized understanding. Patient's husband to transport home.    Patient complains of painful urination, no odor, yellow in color. Left message with Dr. Nino Parsley nurse at outpatient clinic. Waiting for response.   Response: Dr. Nino Parsley nurse states Dr. Delana Meyer said "That is normal after removing foley. Increase fluid intake" Patient updated

## 2016-08-07 NOTE — Progress Notes (Signed)
After bed rest orders completed: -Foley removed at 8am.  -1 assist to chair, legs elevated- tolerated well.  -no c/o pain, no s/s bleeding noted. pulses good.

## 2016-08-07 NOTE — Care Management Important Message (Signed)
Important Message  Patient Details  Name: Susan Myers MRN: [id number] Date of Birth: [birth year]   Medicare Important Message Given:  Yes    Shelbie Ammons, RN 08/07/2016, 9:52 AM

## 2016-08-07 NOTE — Care Management (Signed)
Admitted to this facility with the diagnosis of deep vein thrombus. Lives with husband, Delfino Lovett, [phone]). Last seen Dr. Park Meo PA about 2 weeks ago. Takes care of all basic and instrumental activities of daily living herself, drives. No falls. Good appetite. No home health services in the past. No skilled facility or home oxygen. Prescriptions are filled at [facility]. Shower chair, cane, and rolling walker available if needed.  Husband will transport. IVC filter placed yesterday. Shelbie Ammons RN MSN CCM Care Management [phone]

## 2016-09-01 ENCOUNTER — Encounter (INDEPENDENT_AMBULATORY_CARE_PROVIDER_SITE_OTHER): Payer: Self-pay | Admitting: Vascular Surgery

## 2016-09-01 ENCOUNTER — Ambulatory Visit (INDEPENDENT_AMBULATORY_CARE_PROVIDER_SITE_OTHER): Payer: PPO | Admitting: Vascular Surgery

## 2016-09-01 VITALS — BP 139/92 | HR 95 | Resp 17 | Ht 63.0 in | Wt 202.0 lb

## 2016-09-01 DIAGNOSIS — I82412 Acute embolism and thrombosis of left femoral vein: Secondary | ICD-10-CM | POA: Diagnosis not present

## 2016-09-01 NOTE — Progress Notes (Signed)
MRN : [id number]  Susan Myers is a 65 y.o. (Oct 01, 1951) female who presents with chief complaint of  Chief Complaint  Patient presents with  . Follow-up  .  History of Present Illness:  The patient presents to the office for follow up of her left leg DVT.  DVT was identified at [facility] by Duplex ultrasound.  The initial symptoms were pain and swelling in the lower extremity.  On 08/06/2016 she had venus thrombolysis of the left leg with angioplasty of the left iliac vein.  The patient notes the leg is no longer very painful with dependency but it does still swell quite a bite.  Symptoms are much better with elevation and when she uses compression.  The patient notes minimal edema in the morning which steadily worsens throughout the day.    The patient has been using compression therapy on and off at this point.  No SOB or pleuritic chest pains.  No cough or hemoptysis.  No blood per rectum or blood in any sputum.  No excessive bruising per the patient.       Current Outpatient Prescriptions  Medication Sig Dispense Refill  . simvastatin (ZOCOR) 20 MG tablet Take by mouth.    . hydrochlorothiazide (MICROZIDE) 12.5 MG capsule Take 1 capsule (12.5 mg total) by mouth daily. 30 capsule 0  . levothyroxine (SYNTHROID, LEVOTHROID) 100 MCG tablet Take 1 tablet (100 mcg total) by mouth daily before breakfast. 30 tablet 0  . LORazepam (ATIVAN) 0.5 MG tablet Take 1 tablet (0.5 mg total) by mouth at bedtime. (Patient not taking: Reported on 08/06/2016) 30 tablet 0  . nitrofurantoin, macrocrystal-monohydrate, (MACROBID) 100 MG capsule Take 1 capsule (100 mg total) by mouth every 12 (twelve) hours. (Patient not taking: Reported on 08/06/2016) 10 capsule 0  . risperiDONE (RISPERDAL) 2 MG tablet Take 1 tablet (2 mg total) by mouth at bedtime. 30 tablet 0  . Rivaroxaban (XARELTO) 15 MG TABS tablet Take 15 mg by mouth 2 (two) times daily with a meal.     No current facility-administered  medications for this visit.     Past Medical History:  Diagnosis Date  . DVT (deep venous thrombosis) ([place])   . Hypercholesterolemia   . Kidney stones   . Thyroid disease     Past Surgical History:  Procedure Laterality Date  . BREAST CYST ASPIRATION Bilateral 1990  . PERIPHERAL VASCULAR CATHETERIZATION N/A 08/06/2016   Procedure: IVC Filter Insertion;  Surgeon: Katha Cabal, MD;  Location: [facility];  Service: Cardiovascular;  Laterality: N/A;  . PERIPHERAL VASCULAR CATHETERIZATION Left 08/06/2016   Procedure: Lower Extremity Venography;  Surgeon: Katha Cabal, MD;  Location: [facility];  Service: Cardiovascular;  Laterality: Left;  . PERIPHERAL VASCULAR CATHETERIZATION  08/06/2016   Procedure: Lower Extremity Intervention;  Surgeon: Katha Cabal, MD;  Location: [facility];  Service: Cardiovascular;;    Social History Social History  Substance Use Topics  . Smoking status: Never Smoker  . Smokeless tobacco: Never Used  . Alcohol use No    Family History Family History  Problem Relation Age of Onset  . Breast cancer Neg Hx   No family history of bleeding or clotting disorders, porphyria or autoimmune disease  Allergies  Allergen Reactions  . Amoxicillin Hives  . Levaquin [Levofloxacin In D5w] Hives  . Sulfa Antibiotics Hives     REVIEW OF SYSTEMS (Negative unless checked)  Constitutional: []Weight loss  []Fever  []Chills Cardiac: []Chest  pain   []Chest pressure   []Palpitations   []Shortness of breath when laying flat   []Shortness of breath at rest   []Shortness of breath with exertion. Vascular:  [x]Pain in legs with walking   []Pain in legs at rest   [x]History of DVT   []Phlebitis   [x]Swelling in legs   []Varicose veins Pulmonary:   []Uses home oxygen   []Productive cough   []Hemoptysis   []Wheeze  []COPD   []Asthma Neurologic:  []Dizziness  []Blackouts   []Seizures   []History of stroke   []History of TIA  []Aphasia    []Temporary blindness   []Dysphagia   []Weakness or numbness in arms   []Weakness or numbness in legs Musculoskeletal:  []Arthritis   []Joint swelling   []Joint pain   []Low back pain Hematologic:  []Easy bruising  []Easy bleeding   []Hypercoagulable state   []Anemic   Gastrointestinal:   []Vomiting  []Gastroesophageal reflux/heartburn   []Abdominal pain Genitourinary:  []Chronic kidney disease   []Difficult urination  []Frequent urination  []Burning with urination   []Hematuria Skin:  []Rashes   []Ulcers   []Wounds Psychological:  [] anxiety   [] depression.  Physical Examination  BP (!) 139/92   Pulse 95   Resp 17   Ht 5' 3" (1.6 m)   Wt 202 lb (91.6 kg)   BMI 35.78 kg/m  Gen:  WD/WN, NAD Head: [place]/AT, No temporalis wasting. Ear/Nose/Throat: Hearing grossly intact, nares w/o erythema or drainage, trachea midline Eyes: PERRLA.  Sclera non-icteric Neck: Supple.  No JVD.  Pulmonary:  Good air movement, no use of accessory muscles. Clear bilaterally Cardiac: RRR, normal S1, S2 Vascular: 2+ edema of the left lower extremity, mild venous changes noted Vessel Right Left  Radial Palpable Palpable  Ulnar Palpable Palpable  Brachial Palpable Palpable  Carotid Palpable, without bruit Palpable, without bruit  Femoral Palpable Palpable  Popliteal Palpable Palpable  PT Palpable Palpable  DP Palpable Palpable   Gastrointestinal: Non-tender/non-distended. No guarding.  Musculoskeletal: M/S 5/5 throughout.  No deformity or atrophy.  Neurologic: CN 2-12 intact. Pain and light touch intact in extremities.  Symmetrical.  Speech is fluent.  Psychiatric: Judgment intact, Mood & affect appropriate for pt's clinical situation. Dermatologic: No rashes or ulcers noted.  No cellulitis or open wounds.    Labs Recent Results (from the past 2160 hour(s))  BUN     Status: Abnormal   Collection Time: 08/06/16  2:26 PM  Result Value Ref Range   BUN 23 (H) 6 - 20 mg/dL  Creatinine, serum     Status:  None   Collection Time: 08/06/16  2:26 PM  Result Value Ref Range   Creatinine, Ser 0.88 0.44 - 1.00 mg/dL   GFR calc non Af Amer >60 >60 mL/min   GFR calc Af Amer >60 >60 mL/min    Comment: (NOTE) The eGFR has been calculated using the CKD EPI equation. This calculation has not been validated in all clinical situations. eGFR's persistently <60 mL/min signify possible Chronic Kidney Disease.   CBC     Status: None   Collection Time: 08/06/16  2:26 PM  Result Value Ref Range   WBC 10.5 3.6 - 11.0 K/uL   RBC 4.63 3.80 - 5.20 MIL/uL   Hemoglobin 14.2 12.0 - 16.0 g/dL   HCT 43.0 35.0 - 47.0 %   MCV 92.8 80.0 - 100.0 fL   MCH 30.6 26.0 - 34.0 pg   MCHC 33.0 32.0 - 36.0 g/dL  RDW 14.4 11.5 - 14.5 %   Platelets 340 150 - 440 K/uL  Protime-INR     Status: Abnormal   Collection Time: 08/06/16  9:52 PM  Result Value Ref Range   Prothrombin Time 16.6 (H) 11.4 - 15.2 seconds   INR 1.33   APTT     Status: Abnormal   Collection Time: 08/06/16  9:52 PM  Result Value Ref Range   aPTT 105 (H) 24 - 36 seconds    Comment:        IF BASELINE aPTT IS ELEVATED, SUGGEST PATIENT RISK ASSESSMENT BE USED TO DETERMINE APPROPRIATE ANTICOAGULANT THERAPY.   Heparin level (unfractionated)     Status: Abnormal   Collection Time: 08/07/16  1:56 AM  Result Value Ref Range   Heparin Unfractionated 1.19 (H) 0.30 - 0.70 IU/mL    Comment:        IF HEPARIN RESULTS ARE BELOW EXPECTED VALUES, AND PATIENT DOSAGE HAS BEEN CONFIRMED, SUGGEST FOLLOW UP TESTING OF ANTITHROMBIN III LEVELS.   Heparin level (unfractionated)     Status: None   Collection Time: 08/07/16 12:01 PM  Result Value Ref Range   Heparin Unfractionated 0.62 0.30 - 0.70 IU/mL    Comment:        IF HEPARIN RESULTS ARE BELOW EXPECTED VALUES, AND PATIENT DOSAGE HAS BEEN CONFIRMED, SUGGEST FOLLOW UP TESTING OF ANTITHROMBIN III LEVELS.     Radiology No results found.  Assessment/Plan 1.  DVT left leg: Recommend:   No surgery  or intervention at this point in time.  IVC filter is present and arrangements will be made to see her back in 2 more months at which time plans will be made for removal.  The patient continues on anticoagulation   Elevation was stressed, use of a recliner was discussed.  I have had a long discussion with the patient regarding DVT and post phlebitic changes such as swelling and why it  causes symptoms such as pain.  The patient will wear graduated compression stockings class 1 (20-30 mmHg), beginning after three full days of anticoagulation, on a daily basis a prescription was given. The patient will  continue wearing the stockings first thing in the morning and removing them in the evening. The patient is instructed specifically not to sleep in the stockings.  In addition, behavioral modification including elevation during the day and avoidance of prolonged dependency will be initiated.    The patient will continue anticoagulation for now as there have not been any problems or complications from his anticoagulation therapy at this point.      Hortencia Pilar, MD  09/01/2016 1:46 PM    This note was created with Dragon medical transcription system.  Any errors from dictation are purely unintentional

## 2016-09-05 DIAGNOSIS — Z23 Encounter for immunization: Secondary | ICD-10-CM | POA: Diagnosis not present

## 2016-09-05 DIAGNOSIS — I82512 Chronic embolism and thrombosis of left femoral vein: Secondary | ICD-10-CM | POA: Insufficient documentation

## 2016-09-05 DIAGNOSIS — I82402 Acute embolism and thrombosis of unspecified deep veins of left lower extremity: Secondary | ICD-10-CM | POA: Diagnosis not present

## 2016-09-05 DIAGNOSIS — Z1329 Encounter for screening for other suspected endocrine disorder: Secondary | ICD-10-CM | POA: Diagnosis not present

## 2016-09-05 DIAGNOSIS — Z13220 Encounter for screening for lipoid disorders: Secondary | ICD-10-CM | POA: Diagnosis not present

## 2016-09-05 DIAGNOSIS — Z79899 Other long term (current) drug therapy: Secondary | ICD-10-CM | POA: Diagnosis not present

## 2016-09-25 DIAGNOSIS — F22 Delusional disorders: Secondary | ICD-10-CM | POA: Diagnosis not present

## 2016-09-30 DIAGNOSIS — Z13220 Encounter for screening for lipoid disorders: Secondary | ICD-10-CM | POA: Diagnosis not present

## 2016-09-30 DIAGNOSIS — Z1329 Encounter for screening for other suspected endocrine disorder: Secondary | ICD-10-CM | POA: Diagnosis not present

## 2016-09-30 DIAGNOSIS — Z79899 Other long term (current) drug therapy: Secondary | ICD-10-CM | POA: Diagnosis not present

## 2016-10-07 DIAGNOSIS — E7800 Pure hypercholesterolemia, unspecified: Secondary | ICD-10-CM | POA: Diagnosis not present

## 2016-10-07 DIAGNOSIS — J329 Chronic sinusitis, unspecified: Secondary | ICD-10-CM | POA: Diagnosis not present

## 2016-10-07 DIAGNOSIS — E039 Hypothyroidism, unspecified: Secondary | ICD-10-CM | POA: Diagnosis not present

## 2016-10-07 DIAGNOSIS — I10 Essential (primary) hypertension: Secondary | ICD-10-CM | POA: Diagnosis not present

## 2016-10-20 ENCOUNTER — Other Ambulatory Visit (INDEPENDENT_AMBULATORY_CARE_PROVIDER_SITE_OTHER): Payer: Self-pay | Admitting: Vascular Surgery

## 2016-10-20 DIAGNOSIS — I82402 Acute embolism and thrombosis of unspecified deep veins of left lower extremity: Secondary | ICD-10-CM

## 2016-11-03 ENCOUNTER — Ambulatory Visit (INDEPENDENT_AMBULATORY_CARE_PROVIDER_SITE_OTHER): Payer: PPO | Admitting: Vascular Surgery

## 2016-11-03 ENCOUNTER — Encounter (INDEPENDENT_AMBULATORY_CARE_PROVIDER_SITE_OTHER): Payer: PPO

## 2016-11-07 DIAGNOSIS — I10 Essential (primary) hypertension: Secondary | ICD-10-CM | POA: Diagnosis not present

## 2016-11-13 ENCOUNTER — Encounter (INDEPENDENT_AMBULATORY_CARE_PROVIDER_SITE_OTHER): Payer: Self-pay | Admitting: Vascular Surgery

## 2016-11-13 ENCOUNTER — Ambulatory Visit (INDEPENDENT_AMBULATORY_CARE_PROVIDER_SITE_OTHER): Payer: PPO | Admitting: Vascular Surgery

## 2016-11-13 ENCOUNTER — Ambulatory Visit (INDEPENDENT_AMBULATORY_CARE_PROVIDER_SITE_OTHER): Payer: PPO

## 2016-11-13 VITALS — BP 122/76 | HR 71 | Resp 17 | Ht 62.5 in | Wt 206.0 lb

## 2016-11-13 DIAGNOSIS — I82422 Acute embolism and thrombosis of left iliac vein: Secondary | ICD-10-CM

## 2016-11-13 DIAGNOSIS — I824Z2 Acute embolism and thrombosis of unspecified deep veins of left distal lower extremity: Secondary | ICD-10-CM | POA: Diagnosis not present

## 2016-11-13 DIAGNOSIS — I872 Venous insufficiency (chronic) (peripheral): Secondary | ICD-10-CM | POA: Diagnosis not present

## 2016-11-13 DIAGNOSIS — I890 Lymphedema, not elsewhere classified: Secondary | ICD-10-CM

## 2016-11-13 DIAGNOSIS — M79609 Pain in unspecified limb: Secondary | ICD-10-CM | POA: Diagnosis not present

## 2016-11-13 DIAGNOSIS — E7800 Pure hypercholesterolemia, unspecified: Secondary | ICD-10-CM

## 2016-11-13 DIAGNOSIS — M7989 Other specified soft tissue disorders: Secondary | ICD-10-CM | POA: Diagnosis not present

## 2016-11-13 DIAGNOSIS — I82412 Acute embolism and thrombosis of left femoral vein: Secondary | ICD-10-CM

## 2016-11-13 NOTE — Progress Notes (Signed)
MRN : [id number]  Susan Myers is a 65 y.o. (04-22-1951) female who presents with chief complaint of  Chief Complaint  Patient presents with  . Follow-up  .  History of Present Illness: The patient presents to the office for evaluation of DVT.  DVT was identified at [facility] by Duplex ultrasound.  The initial symptoms were pain and swelling in the lower extremity.  The patient notes the leg continues to be very painful with dependency and swells quite a bite.  Symptoms are much better with elevation.  The patient notes minimal edema in the morning which steadily worsens throughout the day.    The patient has not been using compression therapy at this point.  No SOB or pleuritic chest pains.  No cough or hemoptysis.  No blood per rectum or blood in any sputum.  No excessive bruising per the patient.   Current Meds  Medication Sig  . hydrochlorothiazide (MICROZIDE) 12.5 MG capsule Take 1 capsule (12.5 mg total) by mouth daily.  Marland Kitchen levothyroxine (SYNTHROID, LEVOTHROID) 100 MCG tablet Take 1 tablet (100 mcg total) by mouth daily before breakfast.  . risperiDONE (RISPERDAL) 2 MG tablet Take 1 tablet (2 mg total) by mouth at bedtime. (Patient taking differently: Take 1 mg by mouth at bedtime. )  . Rivaroxaban (XARELTO) 15 MG TABS tablet Take 15 mg by mouth 2 (two) times daily with a meal.  . simvastatin (ZOCOR) 20 MG tablet Take by mouth.    Past Medical History:  Diagnosis Date  . DVT (deep venous thrombosis) ([place])   . Hypercholesterolemia   . Kidney stones   . Thyroid disease     Past Surgical History:  Procedure Laterality Date  . BREAST CYST ASPIRATION Bilateral 1990  . PERIPHERAL VASCULAR CATHETERIZATION N/A 08/06/2016   Procedure: IVC Filter Insertion;  Surgeon: Katha Cabal, MD;  Location: [place] [facility];  Service: Cardiovascular;  Laterality: N/A;  . PERIPHERAL VASCULAR CATHETERIZATION Left 08/06/2016   Procedure: Lower Extremity Venography;  Surgeon: Katha Cabal, MD;  Location: [place] [facility];  Service: Cardiovascular;  Laterality: Left;  . PERIPHERAL VASCULAR CATHETERIZATION  08/06/2016   Procedure: Lower Extremity Intervention;  Surgeon: Katha Cabal, MD;  Location: [place] [facility];  Service: Cardiovascular;;    Social History Social History  Substance Use Topics  . Smoking status: Never Smoker  . Smokeless tobacco: Never Used  . Alcohol use No    Family History Family History  Problem Relation Age of Onset  . Breast cancer Neg Hx   No family history of bleeding/clotting disorders, porphyria or autoimmune disease   Allergies  Allergen Reactions  . Amoxicillin Hives  . Levaquin [Levofloxacin In D5w] Hives  . Sulfa Antibiotics Hives     REVIEW OF SYSTEMS (Negative unless checked)  Constitutional: [] Weight loss  [] Fever  [] Chills Cardiac: [] Chest pain   [] Chest pressure   [] Palpitations   [] Shortness of breath when laying flat   [] Shortness of breath with exertion. Vascular:  [] Pain in legs with walking   [] Pain in legs with standing  [x] History of DVT   [] Phlebitis   [x] Swelling in legs   [] Varicose veins   [] Non-healing ulcers Pulmonary:   [] Uses home oxygen   [] Productive cough   [] Hemoptysis   [] Wheeze  [] COPD   [] Asthma Neurologic:  [] Dizziness   [] Seizures   [] History of stroke   [] History of TIA  [] Aphasia   [] Vissual changes   [] Weakness or numbness in arm   []   Weakness or numbness in leg Musculoskeletal:   [] Joint swelling   [] Joint pain   [] Low back pain Hematologic:  [] Easy bruising  [] Easy bleeding   [] Hypercoagulable state   [] Anemic Gastrointestinal:  [] Diarrhea   [] Vomiting  [] Gastroesophageal reflux/heartburn   [] Difficulty swallowing. Genitourinary:  [] Chronic kidney disease   [] Difficult urination  [] Frequent urination   [] Blood in urine Skin:  [] Rashes   [] Ulcers  Psychological:  [] History of anxiety   []  History of major depression.  Physical Examination  Vitals:   11/13/16 1427  BP:  122/76  Pulse: 71  Resp: 17  Weight: 206 lb (93.4 kg)  Height: 5' 2.5" (1.588 m)   Body mass index is 37.08 kg/m. Gen: WD/WN, NAD Head: [place]/AT, No temporalis wasting.  Ear/Nose/Throat: Hearing grossly intact, nares w/o erythema or drainage, poor dentition Eyes: PER, EOMI, sclera nonicteric.  Neck: Supple, no masses.  No bruit or JVD.  Pulmonary:  Good air movement, clear to auscultation bilaterally, no use of accessory muscles.  Cardiac: RRR, normal S1, S2, no Murmurs. Vascular: left leg mild edema with mild skin changes Vessel Right Left  Radial Palpable Palpable  Ulnar Palpable Palpable  Brachial Palpable Palpable  Carotid Palpable Palpable  Femoral Palpable Palpable  Popliteal Palpable Palpable  PT Palpable Palpable  DP Palpable Palpable   Gastrointestinal: soft, non-distended. No guarding/no peritoneal signs.  Musculoskeletal: M/S 5/5 throughout.  No deformity or atrophy.  Neurologic: CN 2-12 intact. Pain and light touch intact in extremities.  Symmetrical.  Speech is fluent. Motor exam as listed above. Psychiatric: Judgment intact, Mood & affect appropriate for pt's clinical situation. Dermatologic: No rashes or ulcers noted.  No changes consistent with cellulitis. Lymph : No Cervical lymphadenopathy, no lichenification or skin changes of chronic lymphedema.  CBC Lab Results  Component Value Date   WBC 10.5 08/06/2016   HGB 14.2 08/06/2016   HCT 43.0 08/06/2016   MCV 92.8 08/06/2016   PLT 340 08/06/2016    BMET    Component Value Date/Time   NA 143 11/23/2015 2103   K 3.4 (L) 11/23/2015 2103   CL 105 11/23/2015 2103   CO2 28 11/23/2015 2103   GLUCOSE 101 (H) 11/23/2015 2103   BUN 23 (H) 08/06/2016 1426   CREATININE 0.88 08/06/2016 1426   CALCIUM 9.7 11/23/2015 2103   GFRNONAA >60 08/06/2016 1426   GFRAA >60 08/06/2016 1426   CrCl cannot be calculated (Patient's most recent lab result is older than the maximum 21 days allowed.).  COAG Lab Results    Component Value Date   INR 1.33 08/06/2016    Radiology No results found.   Assessment/Plan 1. Acute deep vein thrombosis (DVT) of left iliofemoral vein (HCC) Recommend:   No surgery or intervention at this point in time.  IVC filter should be removed at her convenience. The risks and benefits were reviewed with the patient all questions were answered.  Patietn agrees to proceed with filter removal.  Patient's duplex ultrasound of the venous system shows resolving nonocclusive DVT from the popliteal to the femoral veins.  Elevation was stressed, use of a recliner was discussed.  I have had a long discussion with the patient regarding DVT and post phlebitic changes such as swelling and why it  causes symptoms such as pain.  The patient will wear graduated compression stockings class 1 (20-30 mmHg), beginning after three full days of anticoagulation, on a daily basis a prescription was given. The patient will  beginning wearing the stockings first thing in the  morning and removing them in the evening. The patient is instructed specifically not to sleep in the stockings.  In addition, behavioral modification including elevation during the day and avoidance of prolonged dependency will be initiated.    The patient will continue anticoagulation for now as there have not been any problems or complications at this point.   2. Lymphedema See #1  Compression will continue  3. Chronic venous insufficiency See #1  Compression will continue  4. Hypercholesteremia Continue statin as ordered and reviewed, no changes at this time    Hortencia Pilar, MD  11/13/2016 3:56 PM

## 2016-11-18 ENCOUNTER — Encounter (INDEPENDENT_AMBULATORY_CARE_PROVIDER_SITE_OTHER): Payer: Self-pay

## 2016-11-26 ENCOUNTER — Other Ambulatory Visit (INDEPENDENT_AMBULATORY_CARE_PROVIDER_SITE_OTHER): Payer: Self-pay | Admitting: Vascular Surgery

## 2016-12-12 ENCOUNTER — Encounter
Admission: RE | Admit: 2016-12-12 | Discharge: 2016-12-12 | Disposition: A | Discharge status: Home / Self Care | Payer: PPO | Source: Ambulatory Visit | Attending: Vascular Surgery | Admitting: Vascular Surgery

## 2016-12-15 MED ORDER — CLINDAMYCIN PHOSPHATE 300 MG/50ML IV SOLN
300.0000 mg | Freq: Once | INTRAVENOUS | Status: DC
Start: 2016-12-15 — End: 2016-12-16

## 2016-12-16 ENCOUNTER — Encounter: Payer: Self-pay | Admitting: *Deleted

## 2016-12-16 ENCOUNTER — Ambulatory Visit
Admission: RE | Admit: 2016-12-16 | Discharge: 2016-12-16 | Disposition: A | Discharge status: Home / Self Care | Payer: PPO | Source: Ambulatory Visit | Attending: Vascular Surgery | Admitting: Vascular Surgery

## 2016-12-16 ENCOUNTER — Encounter
Admission: RE | Disposition: A | Discharge status: Home / Self Care | Payer: Self-pay | Source: Ambulatory Visit | Attending: Vascular Surgery

## 2016-12-16 DIAGNOSIS — I872 Venous insufficiency (chronic) (peripheral): Secondary | ICD-10-CM | POA: Diagnosis not present

## 2016-12-16 DIAGNOSIS — Z452 Encounter for adjustment and management of vascular access device: Secondary | ICD-10-CM | POA: Insufficient documentation

## 2016-12-16 DIAGNOSIS — Z880 Allergy status to penicillin: Secondary | ICD-10-CM | POA: Diagnosis not present

## 2016-12-16 DIAGNOSIS — Z87442 Personal history of urinary calculi: Secondary | ICD-10-CM | POA: Insufficient documentation

## 2016-12-16 DIAGNOSIS — E079 Disorder of thyroid, unspecified: Secondary | ICD-10-CM | POA: Insufficient documentation

## 2016-12-16 DIAGNOSIS — Z882 Allergy status to sulfonamides status: Secondary | ICD-10-CM | POA: Insufficient documentation

## 2016-12-16 DIAGNOSIS — I82409 Acute embolism and thrombosis of unspecified deep veins of unspecified lower extremity: Secondary | ICD-10-CM | POA: Diagnosis not present

## 2016-12-16 DIAGNOSIS — E7800 Pure hypercholesterolemia, unspecified: Secondary | ICD-10-CM | POA: Diagnosis not present

## 2016-12-16 DIAGNOSIS — I82412 Acute embolism and thrombosis of left femoral vein: Secondary | ICD-10-CM | POA: Insufficient documentation

## 2016-12-16 DIAGNOSIS — Z881 Allergy status to other antibiotic agents status: Secondary | ICD-10-CM | POA: Insufficient documentation

## 2016-12-16 DIAGNOSIS — I890 Lymphedema, not elsewhere classified: Secondary | ICD-10-CM | POA: Insufficient documentation

## 2016-12-16 DIAGNOSIS — I2699 Other pulmonary embolism without acute cor pulmonale: Secondary | ICD-10-CM | POA: Diagnosis not present

## 2016-12-16 HISTORY — PX: PERIPHERAL VASCULAR CATHETERIZATION: SHX172C

## 2016-12-16 SURGERY — IVC FILTER REMOVAL
Anesthesia: Moderate Sedation

## 2016-12-16 MED ORDER — SODIUM CHLORIDE 0.9 % IV SOLN
INTRAVENOUS | Status: DC
  Administered 2016-12-16: 1000 mL via INTRAVENOUS

## 2016-12-16 MED ORDER — HEPARIN (PORCINE) IN NACL 2-0.9 UNIT/ML-% IJ SOLN
INTRAMUSCULAR | Status: AC
  Filled 2016-12-16: qty 500

## 2016-12-16 MED ORDER — MIDAZOLAM HCL 5 MG/5ML IJ SOLN
INTRAMUSCULAR | Status: AC
  Filled 2016-12-16: qty 5

## 2016-12-16 MED ORDER — IOPAMIDOL (ISOVUE-300) INJECTION 61%
INTRAVENOUS | Status: DC | PRN
  Administered 2016-12-16: 30 mL via INTRAVENOUS

## 2016-12-16 MED ORDER — LIDOCAINE HCL (PF) 1 % IJ SOLN
INTRAMUSCULAR | Status: AC
  Filled 2016-12-16: qty 30

## 2016-12-16 MED ORDER — FENTANYL CITRATE (PF) 100 MCG/2ML IJ SOLN
INTRAMUSCULAR | Status: AC
  Filled 2016-12-16: qty 2

## 2016-12-16 MED ORDER — FENTANYL CITRATE (PF) 100 MCG/2ML IJ SOLN
INTRAMUSCULAR | Status: DC | PRN
  Administered 2016-12-16: 50 ug via INTRAVENOUS
  Administered 2016-12-16: 25 ug via INTRAVENOUS
  Administered 2016-12-16 (×2): 50 ug via INTRAVENOUS

## 2016-12-16 MED ORDER — ONDANSETRON HCL 4 MG/2ML IJ SOLN
INTRAMUSCULAR | Status: AC
  Administered 2016-12-16: 4 mg via INTRAVENOUS
  Filled 2016-12-16: qty 2

## 2016-12-16 MED ORDER — HEPARIN SODIUM (PORCINE) 1000 UNIT/ML IJ SOLN
INTRAMUSCULAR | Status: DC | PRN
  Administered 2016-12-16: 3000 [IU] via INTRAVENOUS

## 2016-12-16 MED ORDER — MIDAZOLAM HCL 2 MG/2ML IJ SOLN
INTRAMUSCULAR | Status: DC | PRN
  Administered 2016-12-16 (×3): 1 mg via INTRAVENOUS
  Administered 2016-12-16: 2 mg via INTRAVENOUS

## 2016-12-16 MED ORDER — ONDANSETRON HCL 4 MG/2ML IJ SOLN
4.0000 mg | Freq: Once | INTRAMUSCULAR | Status: AC
  Administered 2016-12-16: 4 mg via INTRAVENOUS

## 2016-12-16 MED ORDER — HEPARIN SODIUM (PORCINE) 1000 UNIT/ML IJ SOLN
INTRAMUSCULAR | Status: AC
  Filled 2016-12-16: qty 1

## 2016-12-16 SURGICAL SUPPLY — 16 items
BALLN ULTRVRSE 8X40X75C (BALLOONS) ×2
BALLOON ULTRVRSE 8X40X75C (BALLOONS) IMPLANT
CATH BEACON 5.038 65CM KMP-01 (CATHETERS) ×1 IMPLANT
DEVICE PRESTO INFLATION (MISCELLANEOUS) ×1 IMPLANT
DEVICE TORQUE (MISCELLANEOUS) ×1 IMPLANT
FORCEPS BIOP JAW 1.8X100 (FORCEP) ×1 IMPLANT
GLIDEWIRE STIFF .35X180X3 HYDR (WIRE) ×1 IMPLANT
NDL ENTRY 21GA 7CM ECHOTIP (NEEDLE) IMPLANT
NEEDLE ENTRY 21GA 7CM ECHOTIP (NEEDLE) ×2 IMPLANT
PACK ANGIOGRAPHY (CUSTOM PROCEDURE TRAY) ×1 IMPLANT
SET INTRO CAPELLA COAXIAL (SET/KITS/TRAYS/PACK) ×1 IMPLANT
SET VENACAVA FILTER RETRIEVAL (MISCELLANEOUS) ×1 IMPLANT
SHEATH 12FRX45 (SHEATH) ×1 IMPLANT
WIRE J 3MM .035X145CM (WIRE) ×1 IMPLANT
WIRE MAGIC TOR.035 180C (WIRE) ×1 IMPLANT
WIRE MAGIC TORQUE 260C (WIRE) ×1 IMPLANT

## 2016-12-16 NOTE — Op Note (Signed)
  OPERATIVE NOTE   PRE-OPERATIVE DIAGNOSIS: DVT with PE  POST-OPERATIVE DIAGNOSIS: Same  PROCEDURE: 1. Attempted Retrieval of IVC Filter 2. Inferior Vena Cavagram  SURGEON: Katha Cabal, M.D.  ANESTHESIA:  Conscious sedation was administered under my direct supervision. IV Versed plus fentanyl were utilized. Continuous ECG, pulse oximetry and blood pressure was monitored throughout the entire procedure. Conscious sedation was for a total of 25 minutes.  ESTIMATED BLOOD LOSS: Minimal cc  FINDING(S):inferior vena cava is widely patent filter is at an angle with the hook embedded into the wall of the vena cava several millimeters deep. Filter is unable to be removed  SPECIMEN(S): None  INDICATIONS:   Susan Myers is a 66 y.o. female who presents with DVT and PE. The patient has now tolerated anticoagulation for several months. Therefore, the IVC filter is recommended to be removed. The risks and benefits were reviewed with the patient all questions were answered and they agreed to proceed with IVC filter retrieval. Oral anticoagulation will be continued.  DESCRIPTION: After obtaining full informed written consent, the patient was brought back to the Special Procedure Suite and placed in the supine position.  The patient received IV antibiotics prior to induction.  After obtaining adequate sedation, the patient was prepped and draped in the standard fashion and appropriate time out is called.     Ultrasound was placed in a sterile sleeve.The right neck was then imaged with ultrasound.   Jugular vein was identified it is echolucent and homogeneous indicating patency. 1% lidocaine is then infiltrated under ultrasound visualization and subsequently a Seldinger needle is inserted under real-time ultrasound guidance.  J-wire is then advanced into the inferior vena cava under fluoroscopic guidance. With the tip of the sheath at the confluence of the iliac veins inferior vena caval  imaging is performed.  After review of the image the sheath is repositioned to above the filter and multiple attempts are made at retrieval with both the snares as well as biopsy forceps. Manipulations with a balloon were also unsuccessful. The sheath is repositioned and a multipurpose Ansell sheath was also attempted to gain better leverage but this is unsuccessful. The filter is left in position intact widely patent  Sheath is removed by pressures held the patient tolerated the procedure well and there were no immediate complications.  Interpretation: inferior vena cava is widely patent filter is embedded into the wall the hook is located several millimeters below the endothelial lining of the filter.. Filter is not removed the procedure was without incident.     COMPLICATIONS: Unable to retrieve the filter  CONDITION: Good  Katha Cabal, M.D. [facility] Office: [phone]   12/16/2016, 9:37 AM

## 2016-12-16 NOTE — H&P (Signed)
[facility] History & Physical Update  The patient was interviewed and re-examined.  The patient's previous History and Physical has been reviewed and is unchanged.  There is no change in the plan of care. We plan to proceed with the scheduled procedure.  Hortencia Pilar, MD  12/16/2016, 8:13 AM

## 2016-12-17 ENCOUNTER — Encounter: Payer: Self-pay | Admitting: Vascular Surgery

## 2016-12-18 ENCOUNTER — Other Ambulatory Visit (INDEPENDENT_AMBULATORY_CARE_PROVIDER_SITE_OTHER): Payer: Self-pay | Admitting: Vascular Surgery

## 2016-12-18 ENCOUNTER — Telehealth (INDEPENDENT_AMBULATORY_CARE_PROVIDER_SITE_OTHER): Payer: Self-pay

## 2016-12-18 DIAGNOSIS — R319 Hematuria, unspecified: Secondary | ICD-10-CM

## 2016-12-18 DIAGNOSIS — N390 Urinary tract infection, site not specified: Secondary | ICD-10-CM | POA: Diagnosis not present

## 2016-12-18 DIAGNOSIS — R310 Gross hematuria: Secondary | ICD-10-CM | POA: Diagnosis not present

## 2016-12-18 NOTE — Telephone Encounter (Signed)
Patient walked into our office this morning complaining of blood in urine with a sample as well, she stated she had taken a sample to her PCP also. She had a procedure on 12/16/16 with Dr. Delana Meyer to remove her IVC filter, and stated that yesterday morning she started have blood in her urine. I let them know that I would talk with Dr. Delana Meyer regarding this and call them back, I spoke with Dr. Delana Meyer and he wants her to have a CTA abd/pelvis and see him on Monday. I have attempted to make contact with the patient twice this morning and left two messages for her to call me back so this can be scheduled.

## 2016-12-19 ENCOUNTER — Ambulatory Visit
Admission: RE | Admit: 2016-12-19 | Discharge: 2016-12-19 | Disposition: A | Discharge status: Home / Self Care | Payer: PPO | Source: Ambulatory Visit | Attending: Vascular Surgery | Admitting: Vascular Surgery

## 2016-12-19 ENCOUNTER — Telehealth (INDEPENDENT_AMBULATORY_CARE_PROVIDER_SITE_OTHER): Payer: Self-pay | Admitting: Vascular Surgery

## 2016-12-19 ENCOUNTER — Other Ambulatory Visit (INDEPENDENT_AMBULATORY_CARE_PROVIDER_SITE_OTHER): Payer: Self-pay

## 2016-12-19 DIAGNOSIS — N200 Calculus of kidney: Secondary | ICD-10-CM | POA: Diagnosis not present

## 2016-12-19 DIAGNOSIS — R319 Hematuria, unspecified: Secondary | ICD-10-CM

## 2016-12-19 DIAGNOSIS — T82525A Displacement of umbrella device, initial encounter: Secondary | ICD-10-CM | POA: Insufficient documentation

## 2016-12-19 DIAGNOSIS — N1330 Unspecified hydronephrosis: Secondary | ICD-10-CM | POA: Insufficient documentation

## 2016-12-19 DIAGNOSIS — N132 Hydronephrosis with renal and ureteral calculous obstruction: Secondary | ICD-10-CM | POA: Diagnosis not present

## 2016-12-19 MED ORDER — IOPAMIDOL (ISOVUE-370) INJECTION 76%
100.0000 mL | Freq: Once | INTRAVENOUS | Status: AC | PRN
  Administered 2016-12-19: 100 mL via INTRAVENOUS

## 2016-12-19 NOTE — Telephone Encounter (Signed)
I spoke with the patient directly over the phone. I discussed the results of her CT scan. She does relate that she is still seeing blood in her urine. This started the day after the procedure when she reinitiated Xarelto therapy.  CT scan shows the filter is in position no evidence of hematoma or extravasation surrounding the filter. There are bilateral kidney stones noted. There is mild hydronephrosis on the right. There are no abnormalities of the renal parenchyma pericapsular hematomas or other evidence of abnormal bleeding.  I've asked that she stop her Xarelto given the filter is still in place she is not at significant risk for pulmonary embolism. She is already scheduled to see me this coming Monday. If her urine clears with cessation of her anticoagulation and she does not necessarily need to come in unless she has further questions and I will arrange an appointment with Dr. Jacqlyn Larsen. She is seen in the past on the next available. She is continuing to bleed after being off of Xarelto I will contact Dr. Roena Malady office directly to expedite a further evaluation.

## 2016-12-21 NOTE — H&P (Signed)
[] Hover for attribution information    MRN : [id number]  Susan Myers is a 66 y.o. ([birth year]) female who presents with chief complaint of     Chief Complaint  Patient presents with  . Follow-up  .  History of Present Illness: The patient presents to the office for evaluation of DVT.  DVT was identified at [facility] by Duplex ultrasound.  The initial symptoms were pain and swelling in the lower extremity.  The patient notes the leg continues to be very painful with dependency and swells quite a bite.  Symptoms are much better with elevation.  The patient notes minimal edema in the morning which steadily worsens throughout the day.    The patient has not been using compression therapy at this point.  No SOB or pleuritic chest pains.  No cough or hemoptysis.  No blood per rectum or blood in any sputum.  No excessive bruising per the patient.   Active Medications      Current Meds  Medication Sig  . hydrochlorothiazide (MICROZIDE) 12.5 MG capsule Take 1 capsule (12.5 mg total) by mouth daily.  Marland Kitchen levothyroxine (SYNTHROID, LEVOTHROID) 100 MCG tablet Take 1 tablet (100 mcg total) by mouth daily before breakfast.  . risperiDONE (RISPERDAL) 2 MG tablet Take 1 tablet (2 mg total) by mouth at bedtime. (Patient taking differently: Take 1 mg by mouth at bedtime. )  . Rivaroxaban (XARELTO) 15 MG TABS tablet Take 15 mg by mouth 2 (two) times daily with a meal.  . simvastatin (ZOCOR) 20 MG tablet Take by mouth.          Past Medical History:  Diagnosis Date  . DVT (deep venous thrombosis) ([place])   . Hypercholesterolemia   . Kidney stones   . Thyroid disease          Past Surgical History:  Procedure Laterality Date  . BREAST CYST ASPIRATION Bilateral 1990  . PERIPHERAL VASCULAR CATHETERIZATION N/A 08/06/2016   Procedure: IVC Filter Insertion;  Surgeon: Katha Cabal, MD;  Location: [place] [facility];  Service: Cardiovascular;  Laterality: N/A;  . PERIPHERAL  VASCULAR CATHETERIZATION Left 08/06/2016   Procedure: Lower Extremity Venography;  Surgeon: Katha Cabal, MD;  Location: [place] [facility];  Service: Cardiovascular;  Laterality: Left;  . PERIPHERAL VASCULAR CATHETERIZATION  08/06/2016   Procedure: Lower Extremity Intervention;  Surgeon: Katha Cabal, MD;  Location: [place] [facility];  Service: Cardiovascular;;    Social History Social History  Substance Use Topics  . Smoking status: Never Smoker  . Smokeless tobacco: Never Used  . Alcohol use No    Family History      Family History  Problem Relation Age of Onset  . Breast cancer Neg Hx   No family history of bleeding/clotting disorders, porphyria or autoimmune disease       Allergies  Allergen Reactions  . Amoxicillin Hives  . Levaquin [Levofloxacin In D5w] Hives  . Sulfa Antibiotics Hives     REVIEW OF SYSTEMS (Negative unless checked)  Constitutional: [] Weight loss  [] Fever  [] Chills Cardiac: [] Chest pain   [] Chest pressure   [] Palpitations   [] Shortness of breath when laying flat   [] Shortness of breath with exertion. Vascular:  [] Pain in legs with walking   [] Pain in legs with standing  [x] History of DVT   [] Phlebitis   [x] Swelling in legs   [] Varicose veins   [] Non-healing ulcers Pulmonary:   [] Uses home oxygen   [] Productive cough   [] Hemoptysis   [] Wheeze  []   COPD   [] Asthma Neurologic:  [] Dizziness   [] Seizures   [] History of stroke   [] History of TIA  [] Aphasia   [] Vissual changes   [] Weakness or numbness in arm   [] Weakness or numbness in leg Musculoskeletal:   [] Joint swelling   [] Joint pain   [] Low back pain Hematologic:  [] Easy bruising  [] Easy bleeding   [] Hypercoagulable state   [] Anemic Gastrointestinal:  [] Diarrhea   [] Vomiting  [] Gastroesophageal reflux/heartburn   [] Difficulty swallowing. Genitourinary:  [] Chronic kidney disease   [] Difficult urination  [] Frequent urination   [] Blood in urine Skin:  [] Rashes   [] Ulcers    Psychological:  [] History of anxiety   []  History of major depression.  Physical Examination     Vitals:   11/13/16 1427  BP: 122/76  Pulse: 71  Resp: 17  Weight: 206 lb (93.4 kg)  Height: 5' 2.5" (1.588 m)   Body mass index is 37.08 kg/m. Gen: WD/WN, NAD Head: [place]/AT, No temporalis wasting.  Ear/Nose/Throat: Hearing grossly intact, nares w/o erythema or drainage, poor dentition Eyes: PER, EOMI, sclera nonicteric.  Neck: Supple, no masses.  No bruit or JVD.  Pulmonary:  Good air movement, clear to auscultation bilaterally, no use of accessory muscles.  Cardiac: RRR, normal S1, S2, no Murmurs. Vascular: left leg mild edema with mild skin changes Vessel Right Left  Radial Palpable Palpable  Ulnar Palpable Palpable  Brachial Palpable Palpable  Carotid Palpable Palpable  Femoral Palpable Palpable  Popliteal Palpable Palpable  PT Palpable Palpable  DP Palpable Palpable   Gastrointestinal: soft, non-distended. No guarding/no peritoneal signs.  Musculoskeletal: M/S 5/5 throughout.  No deformity or atrophy.  Neurologic: CN 2-12 intact. Pain and light touch intact in extremities.  Symmetrical.  Speech is fluent. Motor exam as listed above. Psychiatric: Judgment intact, Mood & affect appropriate for pt's clinical situation. Dermatologic: No rashes or ulcers noted.  No changes consistent with cellulitis. Lymph : No Cervical lymphadenopathy, no lichenification or skin changes of chronic lymphedema.  CBC Recent Labs  Lab Results  Component Value Date   WBC 10.5 08/06/2016   HGB 14.2 08/06/2016   HCT 43.0 08/06/2016   MCV 92.8 08/06/2016   PLT 340 08/06/2016      BMET Labs (Brief)          Component Value Date/Time   NA 143 11/23/2015 2103   K 3.4 (L) 11/23/2015 2103   CL 105 11/23/2015 2103   CO2 28 11/23/2015 2103   GLUCOSE 101 (H) 11/23/2015 2103   BUN 23 (H) 08/06/2016 1426   CREATININE 0.88 08/06/2016 1426   CALCIUM 9.7 11/23/2015 2103    GFRNONAA >60 08/06/2016 1426   GFRAA >60 08/06/2016 1426     CrCl cannot be calculated (Patient's most recent lab result is older than the maximum 21 days allowed.).  COAG Recent Labs       Lab Results  Component Value Date   INR 1.33 08/06/2016      Radiology Imaging Results  No results found.     Assessment/Plan 1. Acute deep vein thrombosis (DVT) of left iliofemoral vein (HCC) Recommend:   No surgery or intervention at this point in time.  IVC filter should be removed at her convenience. The risks and benefits were reviewed with the patient all questions were answered.  Patietn agrees to proceed with filter removal.  Patient's duplex ultrasound of the venous system shows resolving nonocclusive DVT from the popliteal to the femoral veins.  Elevation was stressed, use of a recliner was  discussed.  I have had a long discussion with the patient regarding DVT and post phlebitic changes such as swelling and why it  causes symptoms such as pain.  The patient will wear graduated compression stockings class 1 (20-30 mmHg), beginning after three full days of anticoagulation, on a daily basis a prescription was given. The patient will  beginning wearing the stockings first thing in the morning and removing them in the evening. The patient is instructed specifically not to sleep in the stockings.  In addition, behavioral modification including elevation during the day and avoidance of prolonged dependency will be initiated.    The patient will continue anticoagulation for now as there have not been any problems or complications at this point.   2. Lymphedema See #1  Compression will continue  3. Chronic venous insufficiency See #1  Compression will continue  4. Hypercholesteremia Continue statin as ordered and reviewed, no changes at this time    Hortencia Pilar, MD  11/13/2016 3:56 PM

## 2016-12-22 ENCOUNTER — Ambulatory Visit (INDEPENDENT_AMBULATORY_CARE_PROVIDER_SITE_OTHER): Payer: PPO | Admitting: Vascular Surgery

## 2016-12-22 DIAGNOSIS — F22 Delusional disorders: Secondary | ICD-10-CM | POA: Diagnosis not present

## 2016-12-26 ENCOUNTER — Ambulatory Visit: Payer: PPO

## 2017-01-01 ENCOUNTER — Ambulatory Visit (INDEPENDENT_AMBULATORY_CARE_PROVIDER_SITE_OTHER): Payer: PPO | Admitting: Vascular Surgery

## 2017-01-01 ENCOUNTER — Encounter (INDEPENDENT_AMBULATORY_CARE_PROVIDER_SITE_OTHER): Payer: Self-pay | Admitting: Vascular Surgery

## 2017-01-01 VITALS — BP 136/90 | HR 94 | Resp 16 | Ht 63.0 in | Wt 203.0 lb

## 2017-01-01 DIAGNOSIS — I890 Lymphedema, not elsewhere classified: Secondary | ICD-10-CM

## 2017-01-01 DIAGNOSIS — I82402 Acute embolism and thrombosis of unspecified deep veins of left lower extremity: Secondary | ICD-10-CM | POA: Diagnosis not present

## 2017-01-01 DIAGNOSIS — E7800 Pure hypercholesterolemia, unspecified: Secondary | ICD-10-CM | POA: Diagnosis not present

## 2017-01-01 DIAGNOSIS — I872 Venous insufficiency (chronic) (peripheral): Secondary | ICD-10-CM | POA: Diagnosis not present

## 2017-01-01 NOTE — Progress Notes (Signed)
MRN : [id number]  JAYELYNN PETERSHEIM is a 66 y.o. (1951-07-16) female who presents with chief complaint of  Chief Complaint  Patient presents with  . Routine Post Op    2 week follow up  .  History of Present Illness: The patient returns to the office for followup status post angiogram with attempted IVC filter removal.  Her hematuria has stopped and she is now back on her Xarelto.  She has not seen Dr Jacqlyn Larsen yet.   Swelling of her left leg is stable and "not much" at this time.  There have been no significant changes to the patient's overall health care.  The patient denies amaurosis fugax or recent TIA symptoms. There are no recent neurological changes noted. The patient denies history of DVT, PE or superficial thrombophlebitis. The patient denies recent episodes of angina or shortness of breath.    Current Meds  Medication Sig  . acetaminophen (TYLENOL) 500 MG tablet Take 1,000 mg by mouth every 6 (six) hours as needed (for pain.).  Marland Kitchen bisacodyl (GENTLE LAXATIVE) 5 MG EC tablet Take 5 mg by mouth every other day.  . hydrochlorothiazide (MICROZIDE) 12.5 MG capsule Take 1 capsule (12.5 mg total) by mouth daily.  Marland Kitchen levothyroxine (SYNTHROID, LEVOTHROID) 100 MCG tablet Take 1 tablet (100 mcg total) by mouth daily before breakfast.  . meloxicam (MOBIC) 7.5 MG tablet Take 7.5 mg by mouth daily as needed for pain.  . pseudoephedrine (SUDAFED) 30 MG tablet Take 30 mg by mouth every 4 (four) hours as needed for congestion.  . risperiDONE (RISPERDAL) 1 MG tablet Take 1 mg by mouth at bedtime.  . rivaroxaban (XARELTO) 20 MG TABS tablet Take 20 mg by mouth daily.  . simvastatin (ZOCOR) 20 MG tablet Take 20 mg by mouth every evening.     Past Medical History:  Diagnosis Date  . DVT (deep venous thrombosis) ([place])   . Hypercholesterolemia   . Kidney stones   . Thyroid disease     Past Surgical History:  Procedure Laterality Date  . BREAST CYST ASPIRATION Bilateral 1990  . PERIPHERAL  VASCULAR CATHETERIZATION N/A 08/06/2016   Procedure: IVC Filter Insertion;  Surgeon: Katha Cabal, MD;  Location: [facility];  Service: Cardiovascular;  Laterality: N/A;  . PERIPHERAL VASCULAR CATHETERIZATION Left 08/06/2016   Procedure: Lower Extremity Venography;  Surgeon: Katha Cabal, MD;  Location: [facility];  Service: Cardiovascular;  Laterality: Left;  . PERIPHERAL VASCULAR CATHETERIZATION  08/06/2016   Procedure: Lower Extremity Intervention;  Surgeon: Katha Cabal, MD;  Location: [facility];  Service: Cardiovascular;;  . PERIPHERAL VASCULAR CATHETERIZATION N/A 12/16/2016   Procedure: IVC Filter Removal;  Surgeon: Katha Cabal, MD;  Location: [facility];  Service: Cardiovascular;  Laterality: N/A;    Social History Social History  Substance Use Topics  . Smoking status: Never Smoker  . Smokeless tobacco: Never Used  . Alcohol use No    Family History Family History  Problem Relation Age of Onset  . Breast cancer Neg Hx   No family history of bleeding/clotting disorders, porphyria or autoimmune disease   Allergies  Allergen Reactions  . Amoxicillin Hives    Has patient had a PCN reaction causing immediate rash, facial/tongue/throat swelling, SOB or lightheadedness with hypotension:No Has patient had a PCN reaction causing severe rash involving mucus membranes or skin necrosis:unsure Has patient had a PCN reaction that required hospitalization:No Has patient had a PCN reaction occurring within  the last 10 years: No If all of the above answers are "NO", then may proceed with Cephalosporin use.   [name] [Levofloxacin In D5w] Hives  . Penicillins Itching    Has patient had a PCN reaction causing immediate rash, facial/tongue/throat swelling, SOB or lightheadedness with hypotension:No Has patient had a PCN reaction causing severe rash involving mucus membranes or skin necrosis:unsure Has patient had a PCN reaction  that required hospitalization:No Has patient had a PCN reaction occurring within the last 10 years: No If all of the above answers are "NO", then may proceed with Cephalosporin use.  . Sulfa Antibiotics Hives     REVIEW OF SYSTEMS (Negative unless checked)  Constitutional: [] Weight loss  [] Fever  [] Chills Cardiac: [] Chest pain   [] Chest pressure   [] Palpitations   [] Shortness of breath when laying flat   [] Shortness of breath with exertion. Vascular:  [] Pain in legs with walking   [] Pain in legs at rest  [] History of DVT   [] Phlebitis   [] Swelling in legs   [] Varicose veins   [] Non-healing ulcers Pulmonary:   [] Uses home oxygen   [] Productive cough   [] Hemoptysis   [] Wheeze  [] COPD   [] Asthma Neurologic:  [] Dizziness   [] Seizures   [] History of stroke   [] History of TIA  [] Aphasia   [] Vissual changes   [] Weakness or numbness in arm   [] Weakness or numbness in leg Musculoskeletal:   [] Joint swelling   [] Joint pain   [] Low back pain Hematologic:  [] Easy bruising  [] Easy bleeding   [] Hypercoagulable state   [] Anemic Gastrointestinal:  [] Diarrhea   [] Vomiting  [] Gastroesophageal reflux/heartburn   [] Difficulty swallowing. Genitourinary:  [] Chronic kidney disease   [] Difficult urination  [] Frequent urination   [] Blood in urine Skin:  [] Rashes   [] Ulcers  Psychological:  [] History of anxiety   []  History of major depression.  Physical Examination  Vitals:   01/01/17 1307  BP: 136/90  Pulse: 94  Resp: 16  Weight: 203 lb (92.1 kg)  Height: 5\' 3"  (1.6 m)   Body mass index is 35.96 kg/m. Gen: WD/WN, NAD Head: [place]/AT, No temporalis wasting.  Ear/Nose/Throat: Hearing grossly intact, nares w/o erythema or drainage, poor dentition Eyes: PER, EOMI, sclera nonicteric.  Neck: Supple, no masses.  No bruit or JVD.  Pulmonary:  Good air movement, clear to auscultation bilaterally, no use of accessory muscles.  Cardiac: RRR, normal S1, S2, no Murmurs. Vascular: left leg mild edema with mild skin  changes Vessel Right Left  Radial Palpable Palpable  Ulnar Palpable Palpable  Brachial Palpable Palpable  Carotid Palpable Palpable  Femoral Palpable Palpable  Popliteal Palpable Palpable  PT Palpable Palpable  DP Palpable Palpable  Gastrointestinal: soft, non-distended. No guarding/no peritoneal signs.  Musculoskeletal: M/S 5/5 throughout.  No deformity or atrophy.  Neurologic: CN 2-12 intact. Pain and light touch intact in extremities.  Symmetrical.  Speech is fluent. Motor exam as listed above. Psychiatric: Judgment intact, Mood & affect appropriate for pt's clinical situation. Dermatologic: No rashes or ulcers noted.  No changes consistent with cellulitis. Lymph : No Cervical lymphadenopathy, no lichenification or skin changes of chronic lymphedema.  CBC Lab Results  Component Value Date   WBC 10.5 08/06/2016   HGB 14.2 08/06/2016   HCT 43.0 08/06/2016   MCV 92.8 08/06/2016   PLT 340 08/06/2016    BMET    Component Value Date/Time   NA 143 11/23/2015 2103   K 3.4 (L) 11/23/2015 2103   CL 105 11/23/2015 2103  CO2 28 11/23/2015 2103   GLUCOSE 101 (H) 11/23/2015 2103   BUN 23 (H) 08/06/2016 1426   CREATININE 0.88 08/06/2016 1426   CALCIUM 9.7 11/23/2015 2103   GFRNONAA >60 08/06/2016 1426   GFRAA >60 08/06/2016 1426   CrCl cannot be calculated (Patient's most recent lab result is older than the maximum 21 days allowed.).  COAG Lab Results  Component Value Date   INR 1.33 08/06/2016    Radiology Ct Abdomen Pelvis W Wo Contrast  Result Date: 12/19/2016 CLINICAL DATA:  Gross hematuria. Unsuccessful IVC filter removal 3 days ago. History of kidney stones. Prior hysterectomy and appendectomy. EXAM: CT ABDOMEN AND PELVIS WITHOUT AND WITH CONTRAST TECHNIQUE: Multidetector CT imaging of the abdomen and pelvis was performed following the standard protocol before and following the bolus administration of intravenous contrast. CONTRAST:  100 mL Isovue 370 IV COMPARISON:   12/25/2009 FINDINGS: Lower chest: Lung bases are clear. Hepatobiliary: Liver is within normal limits. Gallbladder is decompressed. No intrahepatic or extrahepatic ductal dilatation. Pancreas: Within normal limits. Spleen: Within normal limits. Adrenals/Urinary Tract: Adrenal glands are within normal limits. Two nonobstructing right renal calculi measuring up to 7 mm in the right lower pole (series 2/ image 39). 2 mm nonobstructing left upper pole renal calculus (coronal image 68). No ureteral or bladder calculi. Kidneys are otherwise within normal limits. Mild right hydronephrosis. Associated urothelial thickening/enhancement along the right proximal ureter (series 4/ image 41; coronal image 48). Bladder is within normal limits. Stomach/Bowel: Stomach is within normal limits. No evidence of bowel obstruction. Appendix is not discretely visualized. Vascular/Lymphatic: No evidence of abdominal aortic aneurysm. IVC filter at the L2-3 level is tilted within the lumen. Cranial tip of the IVC projects along the right lateral aspect of the IVC outside of the lumen, just posterior wall of the duodenum (series 6/ image 12). Several distal legs of the IVC filter project along the left lateral aspect of the IVC outside of the lumen, one of which is just anterior to the L3 vertebral body (series 6/image 18). No associated hemorrhage. No suspicious abdominopelvic lymphadenopathy. Reproductive: Uterus is within normal limits. No adnexal masses. Other: No abdominopelvic ascites. Musculoskeletal: Visualized osseous structures are within normal limits. IMPRESSION: Bilateral nonobstructing renal calculi, measuring up to 7 mm in the right lower kidney. No ureteral or bladder calculi. Mild right hydronephrosis. Associated urothelial thickening/enhancement along the right proximal ureter, possibly reflecting a post infectious/inflammatory stricture. Underlying urothelial neoplasm is difficult to exclude, but no definite mass is evident  on CT. Consider urologic consultation as clinically warranted. Malpositioned IVC filter, as described above. No associated hemorrhage. These results will be called to the ordering clinician or representative by the Radiologist Assistant, and communication documented in the PACS or zVision Dashboard. Electronically Signed   By: Julian Hy M.D.   On: 12/19/2016 17:40    Assessment/Plan 1. Deep vein thrombosis (DVT) of left lower extremity, unspecified chronicity, unspecified vein (HCC) Recommend:   No surgery or intervention at this point in time.  IVC filter is not able to be removed and will therefore remain.  Patient's duplex ultrasound of the venous system shows DVT from the popliteal to the femoral veins.  The patient will continue on anticoagulation   Elevation was stressed, use of a recliner was discussed.  I have had a long discussion with the patient regarding DVT and post phlebitic changes such as swelling and why it  causes symptoms such as pain.  The patient will wear graduated compression stockings class 1 (  20-30 mmHg), beginning after three full days of anticoagulation, on a daily basis a prescription was given. The patient will  beginning wearing the stockings first thing in the morning and removing them in the evening. The patient is instructed specifically not to sleep in the stockings.  In addition, behavioral modification including elevation during the day and avoidance of prolonged dependency will be initiated.    The patient will continue anticoagulation for now as there have not been any problems or complications at this point.   - VAS US AORTA/IVC/ILIACS; Future  2. Chronic venous insufficiency See #1  3. Lymphedema Continue compression and elevation  4. Hypercholesteremia Continue statin as ordered and reviewed, no changes at this time   Hortencia Pilar, MD  01/01/2017 1:34 PM

## 2017-01-06 DIAGNOSIS — Z87440 Personal history of urinary (tract) infections: Secondary | ICD-10-CM | POA: Diagnosis not present

## 2017-01-06 DIAGNOSIS — R05 Cough: Secondary | ICD-10-CM | POA: Diagnosis not present

## 2017-01-06 DIAGNOSIS — M25511 Pain in right shoulder: Secondary | ICD-10-CM | POA: Diagnosis not present

## 2017-01-06 DIAGNOSIS — Z79899 Other long term (current) drug therapy: Secondary | ICD-10-CM | POA: Diagnosis not present

## 2017-01-06 DIAGNOSIS — F3341 Major depressive disorder, recurrent, in partial remission: Secondary | ICD-10-CM | POA: Diagnosis not present

## 2017-01-06 DIAGNOSIS — M797 Fibromyalgia: Secondary | ICD-10-CM | POA: Diagnosis not present

## 2017-01-06 DIAGNOSIS — E039 Hypothyroidism, unspecified: Secondary | ICD-10-CM | POA: Diagnosis not present

## 2017-01-06 DIAGNOSIS — N200 Calculus of kidney: Secondary | ICD-10-CM | POA: Diagnosis not present

## 2017-01-06 DIAGNOSIS — G8929 Other chronic pain: Secondary | ICD-10-CM | POA: Diagnosis not present

## 2017-01-06 DIAGNOSIS — E7800 Pure hypercholesterolemia, unspecified: Secondary | ICD-10-CM | POA: Diagnosis not present

## 2017-01-07 ENCOUNTER — Ambulatory Visit: Payer: PPO | Admitting: Urology

## 2017-01-07 ENCOUNTER — Encounter: Payer: Self-pay | Admitting: Urology

## 2017-01-07 VITALS — BP 144/87 | HR 105 | Ht 63.0 in | Wt 202.0 lb

## 2017-01-07 DIAGNOSIS — N200 Calculus of kidney: Secondary | ICD-10-CM

## 2017-01-07 DIAGNOSIS — N3001 Acute cystitis with hematuria: Secondary | ICD-10-CM

## 2017-01-07 DIAGNOSIS — N1330 Unspecified hydronephrosis: Secondary | ICD-10-CM

## 2017-01-07 DIAGNOSIS — R310 Gross hematuria: Secondary | ICD-10-CM | POA: Diagnosis not present

## 2017-01-07 LAB — MICROSCOPIC EXAMINATION

## 2017-01-07 LAB — URINALYSIS, COMPLETE
BILIRUBIN UA: NEGATIVE
Glucose, UA: NEGATIVE
KETONES UA: NEGATIVE
Leukocytes, UA: NEGATIVE
NITRITE UA: NEGATIVE
Protein, UA: NEGATIVE
Urobilinogen, Ur: 0.2 mg/dL (ref 0.2–1.0)
pH, UA: 6.5 (ref 5.0–7.5)

## 2017-01-07 MED ORDER — FOSFOMYCIN TROMETHAMINE 3 G PO PACK: 3.0000 g | PACK | Freq: Once | ORAL | 0 refills | Status: AC

## 2017-01-07 NOTE — Progress Notes (Signed)
01/07/2017 3:09 PM   Susan Myers [birth year] [id number]  Referring provider: Idelle Crouch, MD [facility] [place], Susan Myers Susan Myers  Chief Complaint  Patient presents with  . Nephrolithiasis    New Patient    HPI: 66 yo F who presents for further evaluation of gross hematuria/ right hydronephrosis/ nephrolithaisis.  She has a complex history diagnosed with  LE DVT 07/2016 who underwent IVC filter placement who returned to the operating room on 12/16/2016 with Dr. Delana Myers for attempted retrieval. This was unsuccessful due to the location of the hook which was embedded into the wall of the vena cava.  Shortly thereafter, she developed gross hematuria after resuming Xarelto therapy.    Further workup, she will underwent a CT abdomen pelvis with contrast which showed no evidence of hematoma or extravasation around the filter. Incidentally, there is mild right hydronephrosis with rinse of urothelial thickening/enhancement along the proximal ureter.  In addition, there were 2 nonobstructing renal calculi measuring up to 7 mm on right and 2 mm within the LUP.  She has a known history of kidney stones and was previously followed by Dr. Jacqlyn Larsen. She is passed quite large stones and never recurred surgical intervention for these. She brings with her today to very large stones which she passed last year, approximately 8 mm each.   Stone composition is unknown.  Today, she has no further gross hematuria but does complain of  Slight burning with urination. This has been ongoing for several days. She did provide a urine sample to her PCP O1 25 2018 which was frankly positive for UTI and ultimately grew Susan Myers which is highly resistant. In addition, she has multiple drug allergies.  She denies being treated for this infection.  She denies having a catheter at the time of IR procedure.  Remote history of cysto 2011 which was unremarkable per report.  She  has no other complaints today including no flank pain, gross hematuria, fevers, chills, or any other symptoms. She does have chronic lower extremity edema worse compression tights.  PMH: Past Medical History:  Diagnosis Date  . DVT (deep venous thrombosis) ([place])   . Hypercholesterolemia   . Kidney stones   . Thyroid disease     Surgical History: Past Surgical History:  Procedure Laterality Date  . BREAST CYST ASPIRATION Bilateral 1990  . PERIPHERAL VASCULAR CATHETERIZATION N/A 08/06/2016   Procedure: IVC Filter Insertion;  Surgeon: Katha Cabal, MD;  Location: [place] [facility];  Service: Cardiovascular;  Laterality: N/A;  . PERIPHERAL VASCULAR CATHETERIZATION Left 08/06/2016   Procedure: Lower Extremity Venography;  Surgeon: Katha Cabal, MD;  Location: [facility];  Service: Cardiovascular;  Laterality: Left;  . PERIPHERAL VASCULAR CATHETERIZATION  08/06/2016   Procedure: Lower Extremity Intervention;  Surgeon: Katha Cabal, MD;  Location: [place] [facility];  Service: Cardiovascular;;  . PERIPHERAL VASCULAR CATHETERIZATION N/A 12/16/2016   Procedure: IVC Filter Removal;  Surgeon: Katha Cabal, MD;  Location: [place] [facility];  Service: Cardiovascular;  Laterality: N/A;    Home Medications:  Allergies as of 01/07/2017      Reactions   Amoxicillin Hives   Has patient had a PCN reaction causing immediate rash, facial/tongue/throat swelling, SOB or lightheadedness with hypotension:No Has patient had a PCN reaction causing severe rash involving mucus membranes or skin necrosis:unsure Has patient had a PCN reaction that required hospitalization:No Has patient had a PCN reaction occurring within the last  10 years: No If all of the above answers are "NO", then may proceed with Cephalosporin use.   Levaquin [levofloxacin In D5w] Hives   Penicillins Itching   Has patient had a PCN reaction causing immediate rash, facial/tongue/throat swelling, SOB or  lightheadedness with hypotension:No Has patient had a PCN reaction causing severe rash involving mucus membranes or skin necrosis:unsure Has patient had a PCN reaction that required hospitalization:No Has patient had a PCN reaction occurring within the last 10 years: No If all of the above answers are "NO", then may proceed with Cephalosporin use.   Sulfa Antibiotics Hives      Medication List       Accurate as of 01/07/17 11:59 PM. Always use your most recent med list.          fosfomycin 3 g Pack Commonly known as:  MONUROL Take 3 g by mouth once.   GENTLE LAXATIVE 5 MG EC tablet Generic drug:  bisacodyl Take 5 mg by mouth every other day.   hydrochlorothiazide 12.5 MG capsule Commonly known as:  MICROZIDE Take 1 capsule (12.5 mg total) by mouth daily.   levothyroxine 100 MCG tablet Commonly known as:  SYNTHROID, LEVOTHROID Take 1 tablet (100 mcg total) by mouth daily before breakfast.   meloxicam 7.5 MG tablet Commonly known as:  MOBIC Take 7.5 mg by mouth daily as needed for pain.   risperiDONE 1 MG tablet Commonly known as:  RISPERDAL Take 1 mg by mouth at bedtime.   rivaroxaban 20 MG Tabs tablet Commonly known as:  XARELTO Take 20 mg by mouth daily.   simvastatin 20 MG tablet Commonly known as:  ZOCOR Take 20 mg by mouth every evening.       Allergies:  Allergies  Allergen Reactions  . Amoxicillin Hives    Has patient had a PCN reaction causing immediate rash, facial/tongue/throat swelling, SOB or lightheadedness with hypotension:No Has patient had a PCN reaction causing severe rash involving mucus membranes or skin necrosis:unsure Has patient had a PCN reaction that required hospitalization:No Has patient had a PCN reaction occurring within the last 10 years: No If all of the above answers are "NO", then may proceed with Cephalosporin use.   [name] [Levofloxacin In D5w] Hives  . Penicillins Itching    Has patient had a PCN reaction causing  immediate rash, facial/tongue/throat swelling, SOB or lightheadedness with hypotension:No Has patient had a PCN reaction causing severe rash involving mucus membranes or skin necrosis:unsure Has patient had a PCN reaction that required hospitalization:No Has patient had a PCN reaction occurring within the last 10 years: No If all of the above answers are "NO", then may proceed with Cephalosporin use.  . Sulfa Antibiotics Hives    Family History: Family History  Problem Relation Age of Onset  . Breast cancer Neg Hx   . Prostate cancer Neg Hx   . Kidney cancer Neg Hx     Social History:  reports that she has never smoked. She has never used smokeless tobacco. She reports that she does not drink alcohol or use drugs.  ROS: UROLOGY Frequent Urination?: Yes Hard to postpone urination?: No Burning/pain with urination?: No Get up at night to urinate?: Yes Leakage of urine?: No Urine stream starts and stops?: Yes Trouble starting stream?: No Do you have to strain to urinate?: No Blood in urine?: No Urinary tract infection?: No Sexually transmitted disease?: No Injury to kidneys or bladder?: No Painful intercourse?: No Weak stream?: No Currently pregnant?: No Vaginal  bleeding?: No Last menstrual period?: n  Gastrointestinal Nausea?: No Vomiting?: No Indigestion/heartburn?: No Diarrhea?: No Constipation?: No  Constitutional Fever: No Night sweats?: No Weight loss?: No Fatigue?: No  Skin Skin rash/lesions?: No Itching?: No  Eyes Blurred vision?: No Double vision?: No  Ears/Nose/Throat Sore throat?: No Sinus problems?: No  Hematologic/Lymphatic Swollen glands?: No Easy bruising?: No  Cardiovascular Leg swelling?: No Chest pain?: No  Respiratory Cough?: No Shortness of breath?: No  Endocrine Excessive thirst?: No  Musculoskeletal Back pain?: No Joint pain?: No  Neurological Headaches?: No Dizziness?: No  Psychologic Depression?: No Anxiety?:  No  Physical Exam: BP (!) 144/87   Pulse (!) 105   Ht 5\' 3"  (1.6 m)   Wt 202 lb (91.6 kg)   BMI 35.78 kg/m   Constitutional:  Alert and oriented, No acute distress.  Accompanied by husband today. HEENT: [place] AT, moist mucus membranes.  Trachea midline, no masses. Cardiovascular: No clubbing, cyanosis.  Wearing LE compression tights. Respiratory: Normal respiratory effort, no increased work of breathing. GI: Abdomen is soft, nontender, nondistended, no abdominal masses GU: No CVA tenderness.  Skin: No rashes, bruises or suspicious lesions. Neurologic: Grossly intact, no focal deficits, moving all 4 extremities. Psychiatric: Normal mood and affect.  Laboratory Data: Lab Results  Component Value Date   WBC 10.5 08/06/2016   HGB 14.2 08/06/2016   HCT 43.0 08/06/2016   MCV 92.8 08/06/2016   PLT 340 08/06/2016    Lab Results  Component Value Date   CREATININE 0.88 08/06/2016     Lab Results  Component Value Date   HGBA1C 6.0 11/23/2015    Urinalysis Results for orders placed or performed in visit on 01/07/17  Microscopic Examination  Result Value Ref Range   WBC, UA 0-5 0 - 5 /hpf   RBC, UA 0-2 0 - 2 /hpf   Epithelial Cells (non renal) >10 (A) 0 - 10 /hpf   Bacteria, UA Few None seen/Few  Urinalysis, Complete  Result Value Ref Range   Specific Gravity, UA >1.030 (H) 1.005 - 1.030   pH, UA 6.5 5.0 - 7.5   Color, UA Yellow Yellow   Appearance Ur Cloudy (A) Clear   Leukocytes, UA Negative Negative   Protein, UA Negative Negative/Trace   Glucose, UA Negative Negative   Ketones, UA Negative Negative   RBC, UA Trace (A) Negative   Bilirubin, UA Negative Negative   Urobilinogen, Ur 0.2 0.2 - 1.0 mg/dL   Nitrite, UA Negative Negative   Microscopic Examination See below:     Pertinent Imaging: CLINICAL DATA:  Gross hematuria. Unsuccessful IVC filter removal 3 days ago. History of kidney stones. Prior hysterectomy and appendectomy.  EXAM: CT ABDOMEN AND PELVIS  WITHOUT AND WITH CONTRAST  TECHNIQUE: Multidetector CT imaging of the abdomen and pelvis was performed following the standard protocol before and following the bolus administration of intravenous contrast.  CONTRAST:  100 mL Isovue 370 IV  COMPARISON:  12/25/2009  FINDINGS: Lower chest: Lung bases are clear.  Hepatobiliary: Liver is within normal limits.  Gallbladder is decompressed. No intrahepatic or extrahepatic ductal dilatation.  Pancreas: Within normal limits.  Spleen: Within normal limits.  Adrenals/Urinary Tract: Adrenal glands are within normal limits.  Two nonobstructing right renal calculi measuring up to 7 mm in the right lower pole (series 2/ image 39). 2 mm nonobstructing left upper pole renal calculus (coronal image 68). No ureteral or bladder calculi.  Kidneys are otherwise within normal limits.  Mild right hydronephrosis. Associated urothelial thickening/enhancement  along the right proximal ureter (series 4/ image 41; coronal image 48).  Bladder is within normal limits.  Stomach/Bowel: Stomach is within normal limits.  No evidence of bowel obstruction.  Appendix is not discretely visualized.  Vascular/Lymphatic: No evidence of abdominal aortic aneurysm.  IVC filter at the L2-3 level is tilted within the lumen. Cranial tip of the IVC projects along the right lateral aspect of the IVC outside of the lumen, just posterior wall of the duodenum (series 6/ image 12). Several distal legs of the IVC filter project along the left lateral aspect of the IVC outside of the lumen, one of which is just anterior to the L3 vertebral body (series 6/image 18). No associated hemorrhage.  No suspicious abdominopelvic lymphadenopathy.  Reproductive: Uterus is within normal limits.  No adnexal masses.  Other: No abdominopelvic ascites.  Musculoskeletal: Visualized osseous structures are within normal limits.  IMPRESSION: Bilateral  nonobstructing renal calculi, measuring up to 7 mm in the right lower kidney. No ureteral or bladder calculi.  Mild right hydronephrosis. Associated urothelial thickening/enhancement along the right proximal ureter, possibly reflecting a post infectious/inflammatory stricture. Underlying urothelial neoplasm is difficult to exclude, but no definite mass is evident on CT. Consider urologic consultation as clinically warranted.  Malpositioned IVC filter, as described above. No associated hemorrhage.  These results will be called to the ordering clinician or representative by the Radiologist Assistant, and communication documented in the PACS or zVision Dashboard.   Electronically Signed   By: Julian Hy M.D.   On: 12/19/2016 17:40  CT scan reviewed personally today with the patient.  Assessment & Plan:    1. Gross hematuria Etiology unclear although multiple possibilities including stones, UTI, and trauma/ inflammation from IR procedure in setting of anticoagulation Despite these, recommend full hematuria work up RTC in 4 weeks for cysto - Urinalysis, Complete - CULTURE, URINE COMPREHENSIVE  2. Nephrolithiasis Review of most recent CT scan in comparison to KUB from 2013 show decreased overall stone burden and she is passed a few large stones in the interim As such, recommend continued monitoring for recurrent stones without intervention If patient continues to have urinary tract infections, flank pain, gross hematuria, may consider treating Warning symptoms were reviewed in detail Advised to increase water intake, low salt diet, avoidance of animal proteins, etc.  3. Hydronephrosis of right kidney Etiology of right hydroureteronephrosis unclear, although suspect reactive inflammation secondary to IR procedure given the proximity of IVC filter 2 right proximal ureter which is hyperemic As a precaution, Myers have recommended repeat CT Urogram in 3 months She and her  husband are agreeable with this plan  4. Acute cystitis with hematuria Untreated Susan infection in the urine, resistant to multiple drugs and patient has multiple allergies UA today notably clear which is curious given previous positive culture, Pilar Plate a positive UTI, which was untreated Continues to have some mild dysuria As such, we will treat today for presumed infection with fosfomycin and repeat urine culture in about 2 weeks She was advised to call or return sooner for symptoms worsen   Return for repeat urine culture, recheck.  Hollice Espy, MD  [facility] [address] [place], [place] [address] [phone]  Myers spent 45 min with this patient of which greater than 50% was spent in counseling and coordination of care with the patient.

## 2017-01-10 LAB — CULTURE, URINE COMPREHENSIVE

## 2017-01-17 DIAGNOSIS — N200 Calculus of kidney: Secondary | ICD-10-CM | POA: Diagnosis not present

## 2017-01-21 ENCOUNTER — Other Ambulatory Visit: Payer: PPO

## 2017-01-21 ENCOUNTER — Other Ambulatory Visit: Payer: Self-pay | Admitting: Urology

## 2017-01-21 DIAGNOSIS — N3001 Acute cystitis with hematuria: Secondary | ICD-10-CM

## 2017-01-21 LAB — URINALYSIS, COMPLETE
BILIRUBIN UA: NEGATIVE
Glucose, UA: NEGATIVE
Ketones, UA: NEGATIVE
Nitrite, UA: NEGATIVE
Protein, UA: NEGATIVE
Specific Gravity, UA: <1.005 — ABNORMAL LOW (ref 1.005–1.030)
Urobilinogen, Ur: 0.2 mg/dL (ref 0.2–1.0)
pH, UA: 5.5 (ref 5.0–7.5)

## 2017-01-21 LAB — MICROSCOPIC EXAMINATION

## 2017-01-23 LAB — CULTURE, URINE COMPREHENSIVE

## 2017-01-27 ENCOUNTER — Telehealth: Payer: Self-pay

## 2017-01-27 DIAGNOSIS — N390 Urinary tract infection, site not specified: Secondary | ICD-10-CM

## 2017-01-27 MED ORDER — AMOXICILLIN 875 MG PO TABS: 875.0000 mg | ORAL_TABLET | Freq: Two times a day (BID) | ORAL | 0 refills | Status: DC

## 2017-01-27 NOTE — Telephone Encounter (Signed)
OK.  She may want to take benadryl prior to her first dose as a precaution.  GO to ED emergently if develops breathing issues, SOB, swelling.    Hollice Espy, MD

## 2017-01-27 NOTE — Telephone Encounter (Signed)
875mg  bid x7days?

## 2017-01-27 NOTE — Telephone Encounter (Signed)
Spoke with pt husband in reference to amoxicillin. Reinforced with husband pt should take benadryl prior to taking abx and if reactions gets to severe go straight to the ER. Husband voiced understanding.

## 2017-01-27 NOTE — Telephone Encounter (Signed)
-----   Message from Hollice Espy, MD sent at 01/26/2017  8:01 PM EST ----- Unfortunately, it looks like the fosfamycin was ineffective.  Based on the sensitivity pattern of this bacteria and her allergies, I think that she will ultimately end up getting a PICC line and IV antibiotics. Please refer her to Dr. Ola Spurr to arrange this.  Hollice Espy, MD

## 2017-01-27 NOTE — Telephone Encounter (Signed)
Spoke with pt in reference to fosfamycin and needing a referral to ID for PICC/IV abx. Pt stated that she wants to try the amoxicillin prior to having a referral to ID. Pt stated it has been 30 years since shes had amoxicillin.

## 2017-01-27 NOTE — Telephone Encounter (Signed)
Yes, thanks.  Hollice Espy, MD

## 2017-02-18 ENCOUNTER — Ambulatory Visit: Payer: PPO | Admitting: Urology

## 2017-02-18 ENCOUNTER — Encounter: Payer: Self-pay | Admitting: Urology

## 2017-02-18 VITALS — BP 132/83 | HR 91 | Ht 63.0 in | Wt 198.7 lb

## 2017-02-18 DIAGNOSIS — N1330 Unspecified hydronephrosis: Secondary | ICD-10-CM | POA: Diagnosis not present

## 2017-02-18 DIAGNOSIS — N390 Urinary tract infection, site not specified: Secondary | ICD-10-CM

## 2017-02-18 DIAGNOSIS — R310 Gross hematuria: Secondary | ICD-10-CM | POA: Diagnosis not present

## 2017-02-18 DIAGNOSIS — N200 Calculus of kidney: Secondary | ICD-10-CM | POA: Diagnosis not present

## 2017-02-18 LAB — MICROSCOPIC EXAMINATION: Epithelial Cells (non renal): >10 /hpf — ABNORMAL HIGH (ref 0–10)

## 2017-02-18 LAB — URINALYSIS, COMPLETE
Bilirubin, UA: NEGATIVE
GLUCOSE, UA: NEGATIVE
Nitrite, UA: NEGATIVE
PROTEIN UA: NEGATIVE
RBC, UA: NEGATIVE
SPEC GRAV UA: 1.025 (ref 1.005–1.030)
UUROB: 0.2 mg/dL (ref 0.2–1.0)
pH, UA: 5.5 (ref 5.0–7.5)

## 2017-02-18 NOTE — Progress Notes (Signed)
02/18/2017 4:59 PM   Susan Myers [birth year] [id number]  Referring provider: Idelle Crouch, MD [place] [facility] [place], [place] [address]  Chief Complaint  Patient presents with  . Recurrent UTI    HPI: 66 yo F who returns today for evaluation of recurrent urinary tract infection, gross hematuria, and right nephrolithiasis.  Since her last visit, she is passed a large stone which she brings with her today. Her hematuria has since completely resolved.    Gross hematuria  She has a complex history diagnosed with  LE DVT 07/2016 who underwent IVC filter placement who returned to the operating room on 12/16/2016 with Dr. Delana Meyer for attempted retrieval. This was unsuccessful due to the location of the hook which was embedded into the wall of the vena cava.  Shortly thereafter, she developed gross hematuria after resuming Xarelto therapy.    Further workup, she will underwent a CT abdomen pelvis with contrast which showed no evidence of hematoma or extravasation around the filter. Incidentally, there is mild right hydronephrosis with evidence of urothelial thickening/enhancement along the proximal ureter.  In addition, there were 2 nonobstructing renal calculi measuring up to 7 mm on right and 2 mm within the LUP.  Kidney stones She has a known history of kidney stones and was previously followed by Dr. Jacqlyn Larsen. She is passed quite large stones and never recurred surgical intervention for these. She brings with her today to very large stones which she passed last year, approximately 8 mm each.     Passed interval 1 cm stone with associated right flank pain which has since resolved.  This corresponds with a 7 mm right lower pole stone seen on previous CT scan.  Stone composition 93% calcium oxalate monohydrate, 4% calcium oxalate dihydrate, 3% calcium phosphate.  UTI Today, she has no further gross hematuria but does complain of  Slight burning with urination.  This has been ongoing for several days. She did provide a urine sample to her PCP 12/18/2016 which was frankly positive for UTI and ultimately grew Morganella Lilia Pro I which is highly resistant. In addition, she has multiple drug allergies.   After last visit, she was treated with 2 difference antibiotics, first with Murinol at which time her urinary tract failed to resolve, repeat culture growing Escherichia coli.Marland Kitchen She was then treated with amoxiclilin. She has no urinary symptoms today.   Remote history of cysto 2011 which was unremarkable per report.   PMH: Past Medical History:  Diagnosis Date  . DVT (deep venous thrombosis) ([place])   . Hypercholesterolemia   . Kidney stones   . Thyroid disease     Surgical History: Past Surgical History:  Procedure Laterality Date  . BREAST CYST ASPIRATION Bilateral 1990  . PERIPHERAL VASCULAR CATHETERIZATION N/A 08/06/2016   Procedure: IVC Filter Insertion;  Surgeon: Katha Cabal, MD;  Location: [place] [facility];  Service: Cardiovascular;  Laterality: N/A;  . PERIPHERAL VASCULAR CATHETERIZATION Left 08/06/2016   Procedure: Lower Extremity Venography;  Surgeon: Katha Cabal, MD;  Location: [place] [facility];  Service: Cardiovascular;  Laterality: Left;  . PERIPHERAL VASCULAR CATHETERIZATION  08/06/2016   Procedure: Lower Extremity Intervention;  Surgeon: Katha Cabal, MD;  Location: [place] [facility];  Service: Cardiovascular;;  . PERIPHERAL VASCULAR CATHETERIZATION N/A 12/16/2016   Procedure: IVC Filter Removal;  Surgeon: Katha Cabal, MD;  Location: [place] [facility];  Service: Cardiovascular;  Laterality: N/A;    Home Medications:  Allergies  as of 02/18/2017      Reactions   Levaquin [levofloxacin In D5w] Hives   Penicillins Itching   Has patient had a PCN reaction causing immediate rash, facial/tongue/throat swelling, SOB or lightheadedness with hypotension:No Has patient had a PCN reaction causing severe rash  involving mucus membranes or skin necrosis:unsure Has patient had a PCN reaction that required hospitalization:No Has patient had a PCN reaction occurring within the last 10 years: No If all of the above answers are "NO", then may proceed with Cephalosporin use.   Sulfa Antibiotics Hives      Medication List       Accurate as of 02/18/17 11:59 PM. Always use your most recent med list.          GENTLE LAXATIVE 5 MG EC tablet Generic drug:  bisacodyl Take 5 mg by mouth every other day.   hydrochlorothiazide 12.5 MG capsule Commonly known as:  MICROZIDE Take 1 capsule (12.5 mg total) by mouth daily.   levothyroxine 100 MCG tablet Commonly known as:  SYNTHROID, LEVOTHROID Take 1 tablet (100 mcg total) by mouth daily before breakfast.   meloxicam 7.5 MG tablet Commonly known as:  MOBIC Take 7.5 mg by mouth daily as needed for pain.   risperiDONE 1 MG tablet Commonly known as:  RISPERDAL Take 1 mg by mouth at bedtime.   rivaroxaban 20 MG Tabs tablet Commonly known as:  XARELTO Take 20 mg by mouth daily.   simvastatin 20 MG tablet Commonly known as:  ZOCOR Take 20 mg by mouth every evening.       Allergies:  Allergies  Allergen Reactions  . Levaquin [Levofloxacin In D5w] Hives  . Penicillins Itching    Has patient had a PCN reaction causing immediate rash, facial/tongue/throat swelling, SOB or lightheadedness with hypotension:No Has patient had a PCN reaction causing severe rash involving mucus membranes or skin necrosis:unsure Has patient had a PCN reaction that required hospitalization:No Has patient had a PCN reaction occurring within the last 10 years: No If all of the above answers are "NO", then may proceed with Cephalosporin use.  . Sulfa Antibiotics Hives    Family History: Family History  Problem Relation Age of Onset  . Breast cancer Neg Hx   . Prostate cancer Neg Hx   . Kidney cancer Neg Hx     Social History:  reports that she has never smoked.  She has never used smokeless tobacco. She reports that she does not drink alcohol or use drugs.  ROS: UROLOGY Frequent Urination?: No Hard to postpone urination?: No Burning/pain with urination?: No Get up at night to urinate?: No Leakage of urine?: No Urine stream starts and stops?: No Trouble starting stream?: No Do you have to strain to urinate?: No Blood in urine?: No Urinary tract infection?: No Sexually transmitted disease?: No Injury to kidneys or bladder?: No Painful intercourse?: No Weak stream?: No Currently pregnant?: No Vaginal bleeding?: No Last menstrual period?: n  Gastrointestinal Nausea?: No Vomiting?: No Indigestion/heartburn?: No Diarrhea?: No Constipation?: No  Constitutional Fever: No Night sweats?: No Weight loss?: No Fatigue?: No  Skin Skin rash/lesions?: No Itching?: No  Eyes Blurred vision?: No Double vision?: No  Ears/Nose/Throat Sore throat?: No Sinus problems?: No  Hematologic/Lymphatic Swollen glands?: No Easy bruising?: No  Cardiovascular Leg swelling?: No Chest pain?: No  Respiratory Cough?: No Shortness of breath?: No  Endocrine Excessive thirst?: No  Musculoskeletal Back pain?: No Joint pain?: No  Neurological Headaches?: No Dizziness?: No  Psychologic Depression?: No  Anxiety?: No  Physical Exam: BP 132/83 (BP Location: Left Arm, Patient Position: Sitting, Cuff Size: Normal)   Pulse 91   Ht 5\' 3"  (1.6 m)   Wt 198 lb 11.2 oz (90.1 kg)   BMI 35.20 kg/m   Constitutional:  Alert and oriented, No acute distress.  Accompanied by husband today. HEENT: [place] AT, moist mucus membranes.  Trachea midline, no masses. Cardiovascular: No clubbing, cyanosis.  Wearing LE compression tights. Respiratory: Normal respiratory effort, no increased work of breathing. GI: Abdomen is soft, nontender, nondistended, no abdominal masses GU: No CVA tenderness.  Skin: No rashes, bruises or suspicious lesions. Neurologic: Grossly  intact, no focal deficits, moving all 4 extremities. Psychiatric: Normal mood and affect.  Laboratory Data: Lab Results  Component Value Date   WBC 10.5 08/06/2016   HGB 14.2 08/06/2016   HCT 43.0 08/06/2016   MCV 92.8 08/06/2016   PLT 340 08/06/2016    Lab Results  Component Value Date   CREATININE 0.88 08/06/2016     Lab Results  Component Value Date   HGBA1C 6.0 11/23/2015    Urinalysis Results for orders placed or performed in visit on 02/18/17  CULTURE, URINE COMPREHENSIVE  Result Value Ref Range   Urine Culture, Comprehensive Final report    Result 1 Comment   Microscopic Examination  Result Value Ref Range   WBC, UA 6-10 (A) 0 - 5 /hpf   RBC, UA 0-2 0 - 2 /hpf   Epithelial Cells (non renal) >10 (H) 0 - 10 /hpf   Mucus, UA Present (A) Not Estab.   Bacteria, UA Moderate (A) None seen/Few  Urinalysis, Complete  Result Value Ref Range   Specific Gravity, UA 1.025 1.005 - 1.030   pH, UA 5.5 5.0 - 7.5   Color, UA Yellow Yellow   Appearance Ur Clear Clear   Leukocytes, UA Trace (A) Negative   Protein, UA Negative Negative/Trace   Glucose, UA Negative Negative   Ketones, UA Trace (A) Negative   RBC, UA Negative Negative   Bilirubin, UA Negative Negative   Urobilinogen, Ur 0.2 0.2 - 1.0 mg/dL   Nitrite, UA Negative Negative   Microscopic Examination See below:     Pertinent Imaging: No new imaging. Previous CT scan was reviewed again today.  Assessment & Plan:    1. Gross hematuria Resolved after interval passage of the stone, likely exacerbated by anticoagulation At this time, the patient would like to defer cystoscopy and further workup of gross hematuria No evidence of microscopic blood today, will monitor - Urinalysis, Complete - CULTURE, URINE COMPREHENSIVE  2. Nephrolithiasis Review of most recent CT scan in comparison to KUB from 2013 show decreased overall stone burden and she is passed a few large stones in the interim including 7 mm right  sided stone since last visit As such, recommend continued monitoring for recurrent stones without intervention If patient continues to have urinary tract infections, flank pain, gross hematuria, may consider treating Warning symptoms were reviewed in detail Advised to increase water intake, low salt diet, avoidance of animal proteins, etc.  3. Hydronephrosis of right kidney Recommend follow-up renal ultrasound to assess for resolution following interval stone passage  4. Acute cystitis with hematuria Repeat urine culture today, currently asymptomatic  Return in about 3 months (around 05/21/2017) for f/u renal ultrasound, UA , litholink.  Hollice Espy, MD  [facility] [address] [place], [place] [address] [phone]  I spent 25 min with this patient of which  greater than 50% was spent in counseling and coordination of care with the patient.

## 2017-02-21 LAB — CULTURE, URINE COMPREHENSIVE

## 2017-02-23 ENCOUNTER — Telehealth: Payer: Self-pay | Admitting: *Deleted

## 2017-02-23 NOTE — Telephone Encounter (Signed)
Spoke with patient and gave results. Patient ok with plan.

## 2017-02-23 NOTE — Telephone Encounter (Signed)
-----   Message from Hollice Espy, MD sent at 02/23/2017 11:16 AM EDT ----- Urine culture grew low colony count of mixed flora, no evidence of ongoing infection.  Hollice Espy

## 2017-03-17 DIAGNOSIS — F22 Delusional disorders: Secondary | ICD-10-CM | POA: Diagnosis not present

## 2017-03-25 ENCOUNTER — Other Ambulatory Visit: Payer: PPO

## 2017-03-25 DIAGNOSIS — N200 Calculus of kidney: Secondary | ICD-10-CM | POA: Diagnosis not present

## 2017-04-02 ENCOUNTER — Other Ambulatory Visit: Payer: Self-pay | Admitting: Urology

## 2017-04-03 DIAGNOSIS — Z87440 Personal history of urinary (tract) infections: Secondary | ICD-10-CM | POA: Diagnosis not present

## 2017-04-03 DIAGNOSIS — E039 Hypothyroidism, unspecified: Secondary | ICD-10-CM | POA: Diagnosis not present

## 2017-04-03 DIAGNOSIS — E7800 Pure hypercholesterolemia, unspecified: Secondary | ICD-10-CM | POA: Diagnosis not present

## 2017-04-03 DIAGNOSIS — Z79899 Other long term (current) drug therapy: Secondary | ICD-10-CM | POA: Diagnosis not present

## 2017-04-10 DIAGNOSIS — E039 Hypothyroidism, unspecified: Secondary | ICD-10-CM | POA: Diagnosis not present

## 2017-04-10 DIAGNOSIS — Z1231 Encounter for screening mammogram for malignant neoplasm of breast: Secondary | ICD-10-CM | POA: Diagnosis not present

## 2017-04-10 DIAGNOSIS — Z79899 Other long term (current) drug therapy: Secondary | ICD-10-CM | POA: Diagnosis not present

## 2017-04-10 DIAGNOSIS — E7800 Pure hypercholesterolemia, unspecified: Secondary | ICD-10-CM | POA: Diagnosis not present

## 2017-04-10 DIAGNOSIS — Z87440 Personal history of urinary (tract) infections: Secondary | ICD-10-CM | POA: Diagnosis not present

## 2017-04-10 DIAGNOSIS — I82402 Acute embolism and thrombosis of unspecified deep veins of left lower extremity: Secondary | ICD-10-CM | POA: Diagnosis not present

## 2017-05-22 ENCOUNTER — Ambulatory Visit
Admission: RE | Admit: 2017-05-22 | Discharge: 2017-05-22 | Disposition: A | Discharge status: Home / Self Care | Payer: PPO | Source: Ambulatory Visit | Attending: Urology | Admitting: Urology

## 2017-05-22 ENCOUNTER — Ambulatory Visit: Payer: Self-pay | Admitting: Urology

## 2017-05-22 DIAGNOSIS — N390 Urinary tract infection, site not specified: Secondary | ICD-10-CM | POA: Diagnosis not present

## 2017-05-22 DIAGNOSIS — N132 Hydronephrosis with renal and ureteral calculous obstruction: Secondary | ICD-10-CM | POA: Diagnosis not present

## 2017-05-22 DIAGNOSIS — N200 Calculus of kidney: Secondary | ICD-10-CM | POA: Diagnosis not present

## 2017-06-10 ENCOUNTER — Encounter: Payer: Self-pay | Admitting: Urology

## 2017-06-10 ENCOUNTER — Ambulatory Visit: Payer: PPO | Admitting: Urology

## 2017-06-10 VITALS — BP 127/79 | HR 101 | Ht 63.0 in | Wt 198.0 lb

## 2017-06-10 DIAGNOSIS — N1330 Unspecified hydronephrosis: Secondary | ICD-10-CM

## 2017-06-10 DIAGNOSIS — N200 Calculus of kidney: Secondary | ICD-10-CM | POA: Diagnosis not present

## 2017-06-10 DIAGNOSIS — R310 Gross hematuria: Secondary | ICD-10-CM

## 2017-06-10 LAB — URINALYSIS, COMPLETE
Bilirubin, UA: NEGATIVE
Glucose, UA: NEGATIVE
Ketones, UA: NEGATIVE
Nitrite, UA: NEGATIVE
Urobilinogen, Ur: 0.2 mg/dL (ref 0.2–1.0)
pH, UA: 6 (ref 5.0–7.5)

## 2017-06-10 LAB — MICROSCOPIC EXAMINATION: Epithelial Cells (non renal): >10 /hpf — AB (ref 0–10)

## 2017-06-10 NOTE — Progress Notes (Signed)
06/10/2017 1:04 PM   Susan Myers Irean Hong November 04, 1951 [id number]  Referring provider: Idelle Crouch, MD [facility] [place], [place] [address]  Chief Complaint  Patient presents with  . Nephrolithiasis    39month    HPI: 66 yo F who returns today for evaluation of recurrent urinary tract infection, gross hematuria, and right nephrolithiasis.  She has been doing well and had no flank pain, UTIs, or any other symptoms. She is passed a stone since her last visit. She has completed 24 hour urine metabolic workup.   Gross hematuria  She has a complex history diagnosed with  LE DVT 07/2016 who underwent IVC filter placement who returned to the operating room on 12/16/2016 with Dr. Delana Meyer for attempted retrieval. This was unsuccessful due to the location of the hook which was embedded into the wall of the vena cava.  Shortly thereafter, she developed gross hematuria after resuming Xarelto therapy.    Further workup, she will underwent a CT abdomen pelvis with contrast which showed no evidence of hematoma or extravasation around the filter. Incidentally, there is mild right hydronephrosis with evidence of urothelial thickening/enhancement along the proximal ureter.  In addition, there were 2 nonobstructing renal calculi measuring up to 7 mm on right and 2 mm within the LUP.  Kidney stones She has a known history of kidney stones and was previously followed by Dr. Jacqlyn Larsen. She is passed quite large stones and never recurred surgical intervention for these. She brings with her today to very large stones which she passed last year, approximately 8 mm each.     Passed interval 1 cm stone with associated right flank pain which has since resolved.  This corresponds with a 7 mm right lower pole stone seen on previous CT scan.  Stone composition 93% calcium oxalate monohydrate, 4% calcium oxalate dihydrate, 3% calcium phosphate.  Since her last visit, she is undergone a  53-GDJM urine metabolic workup. This showed low urinary volume of 1.44 L. Her urinary calcium was borderline elevated, and 190 mg per day. Excellent urinary citrate, 878 mg per day.  All other labs were within normal limits. Creatinine 0.4.  UTI No further UTIs or stones.  No urinary complaints today. Gross hematuria.  PMH: Past Medical History:  Diagnosis Date  . DVT (deep venous thrombosis) ([place])   . Hypercholesterolemia   . Kidney stones   . Thyroid disease     Surgical History: Past Surgical History:  Procedure Laterality Date  . BREAST CYST ASPIRATION Bilateral 1990  . PERIPHERAL VASCULAR CATHETERIZATION N/A 08/06/2016   Procedure: IVC Filter Insertion;  Surgeon: Katha Cabal, MD;  Location: [place] [facility];  Service: Cardiovascular;  Laterality: N/A;  . PERIPHERAL VASCULAR CATHETERIZATION Left 08/06/2016   Procedure: Lower Extremity Venography;  Surgeon: Katha Cabal, MD;  Location: [place] [facility];  Service: Cardiovascular;  Laterality: Left;  . PERIPHERAL VASCULAR CATHETERIZATION  08/06/2016   Procedure: Lower Extremity Intervention;  Surgeon: Katha Cabal, MD;  Location: [place] [facility];  Service: Cardiovascular;;  . PERIPHERAL VASCULAR CATHETERIZATION N/A 12/16/2016   Procedure: IVC Filter Removal;  Surgeon: Katha Cabal, MD;  Location: [place] [facility];  Service: Cardiovascular;  Laterality: N/A;    Home Medications:  Allergies as of 06/10/2017      Reactions   Levaquin [levofloxacin In D5w] Hives   Penicillins Itching   Has patient had a PCN reaction causing immediate rash, facial/tongue/throat swelling, SOB or lightheadedness with  hypotension:No Has patient had a PCN reaction causing severe rash involving mucus membranes or skin necrosis:unsure Has patient had a PCN reaction that required hospitalization:No Has patient had a PCN reaction occurring within the last 10 years: No If all of the above answers are "NO", then may proceed  with Cephalosporin use.   Sulfa Antibiotics Hives      Medication List       Accurate as of 06/10/17  1:04 PM. Always use your most recent med list.          hydrochlorothiazide 12.5 MG capsule Commonly known as:  MICROZIDE Take 1 capsule (12.5 mg total) by mouth daily.   levothyroxine 100 MCG tablet Commonly known as:  SYNTHROID, LEVOTHROID Take 1 tablet (100 mcg total) by mouth daily before breakfast.   meloxicam 7.5 MG tablet Commonly known as:  MOBIC Take 7.5 mg by mouth daily as needed for pain.   risperiDONE 1 MG tablet Commonly known as:  RISPERDAL Take 1 mg by mouth at bedtime.   rivaroxaban 20 MG Tabs tablet Commonly known as:  XARELTO Take 20 mg by mouth daily.   simvastatin 20 MG tablet Commonly known as:  ZOCOR Take 20 mg by mouth every evening.       Allergies:  Allergies  Allergen Reactions  . Levaquin [Levofloxacin In D5w] Hives  . Penicillins Itching    Has patient had a PCN reaction causing immediate rash, facial/tongue/throat swelling, SOB or lightheadedness with hypotension:No Has patient had a PCN reaction causing severe rash involving mucus membranes or skin necrosis:unsure Has patient had a PCN reaction that required hospitalization:No Has patient had a PCN reaction occurring within the last 10 years: No If all of the above answers are "NO", then may proceed with Cephalosporin use.  . Sulfa Antibiotics Hives    Family History: Family History  Problem Relation Age of Onset  . Breast cancer Neg Hx   . Prostate cancer Neg Hx   . Kidney cancer Neg Hx     Social History:  reports that she has never smoked. She has never used smokeless tobacco. She reports that she does not drink alcohol or use drugs.  ROS: UROLOGY Frequent Urination?: Yes Hard to postpone urination?: No Burning/pain with urination?: No Get up at night to urinate?: Yes Leakage of urine?: No Urine stream starts and stops?: No Trouble starting stream?: No Do you have  to strain to urinate?: No Blood in urine?: No Urinary tract infection?: No Sexually transmitted disease?: No Injury to kidneys or bladder?: No Painful intercourse?: No Weak stream?: No Currently pregnant?: No Vaginal bleeding?: No Last menstrual period?: n  Gastrointestinal Nausea?: No Vomiting?: No Indigestion/heartburn?: No Diarrhea?: No Constipation?: No  Constitutional Fever: No Night sweats?: No Weight loss?: No Fatigue?: No  Skin Skin rash/lesions?: No Itching?: No  Eyes Blurred vision?: No Double vision?: No  Ears/Nose/Throat Sore throat?: No Sinus problems?: No  Hematologic/Lymphatic Swollen glands?: No Easy bruising?: No  Cardiovascular Leg swelling?: No Chest pain?: No  Respiratory Cough?: No Shortness of breath?: No  Endocrine Excessive thirst?: No  Musculoskeletal Back pain?: No Joint pain?: No  Neurological Headaches?: No Dizziness?: No  Psychologic Depression?: No Anxiety?: No  Physical Exam: BP 127/79   Pulse (!) 101   Ht 5\' 3"  (1.6 m)   Wt 198 lb (89.8 kg)   BMI 35.07 kg/m   Constitutional:  Alert and oriented, No acute distress.  Accompanied by husband today. HEENT: [place] AT, moist mucus membranes.  Trachea midline, no  masses. Cardiovascular: No clubbing, cyanosis.   Respiratory: Normal respiratory effort, no increased work of breathing. GI: Abdomen is soft, nontender, nondistended, no abdominal masses GU: No CVA tenderness.  Skin: No rashes, bruises or suspicious lesions. Neurologic: Grossly intact, no focal deficits, moving all 4 extremities. Psychiatric: Normal mood and affect.  Laboratory Data: Lab Results  Component Value Date   WBC 10.5 08/06/2016   HGB 14.2 08/06/2016   HCT 43.0 08/06/2016   MCV 92.8 08/06/2016   PLT 340 08/06/2016    Lab Results  Component Value Date   CREATININE 0.88 08/06/2016     Lab Results  Component Value Date   HGBA1C 6.0 11/23/2015    Urinalysis  UA reviewed today, no  white blood cells or red blood cells, greater than 10 epithelials.  Pertinent Imaging: CLINICAL DATA:  Follow-up right hydronephrosis.  EXAM: RENAL / URINARY TRACT ULTRASOUND COMPLETE  COMPARISON:  CT abdomen dated 12/19/2016.  FINDINGS: Right Kidney:  Length: 9.6 cm. Echogenicity and cortical thickness are within normal limits. No hydronephrosis. Multiple small nonobstructing renal stones, largest measuring 8 mm.  Left Kidney:  Length: 9.8 cm. Echogenicity and cortical thickness are within normal limits. No mass or hydronephrosis visualized.  Bladder:  Decompressed.  Appears normal for degree of bladder distention.  IMPRESSION: 1. No residual right-sided hydronephrosis on today's exam. 2. Right nephrolithiasis. 3. Normal left kidney.   Electronically Signed   By: Franki Cabot M.D.   On: 05/22/2017 16:47  Renal ultrasound personally reviewed today with the patient.  Assessment & Plan:    1. Gross hematuria Resolved after interval passage of the stone, likely exacerbated by anticoagulation At this time, the patient would like to defer cystoscopy and further workup of gross hematuria No evidence of microscopic blood today  2. Nephrolithiasis No flank pain, stable stones on ultrasound [id number] urine metabolic workup reviewed, primary finding was a low urinary volume of 1.4 L. Already on hydrochlorothiazide, does not think she would tolerate potassium citrate due to size of tablet Lengthy discussion today about increasing urinary volume, handout about stone prevention given today KUB in 1 year  3. Hydronephrosis of right kidney Right-sided hydronephrosis resolved- ultrasound reassuring    Return in about 1 year (around 06/10/2018), or KUB.  Hollice Espy, MD  [facility] [address] [place], [place] [address] [phone]

## 2017-08-05 DIAGNOSIS — L859 Epidermal thickening, unspecified: Secondary | ICD-10-CM | POA: Diagnosis not present

## 2017-08-05 DIAGNOSIS — L570 Actinic keratosis: Secondary | ICD-10-CM | POA: Diagnosis not present

## 2017-08-05 DIAGNOSIS — Z1283 Encounter for screening for malignant neoplasm of skin: Secondary | ICD-10-CM | POA: Diagnosis not present

## 2017-08-05 DIAGNOSIS — L812 Freckles: Secondary | ICD-10-CM | POA: Diagnosis not present

## 2017-08-05 DIAGNOSIS — L578 Other skin changes due to chronic exposure to nonionizing radiation: Secondary | ICD-10-CM | POA: Diagnosis not present

## 2017-08-05 DIAGNOSIS — D1800 Hemangioma unspecified site: Secondary | ICD-10-CM | POA: Diagnosis not present

## 2017-08-05 DIAGNOSIS — L853 Xerosis cutis: Secondary | ICD-10-CM | POA: Diagnosis not present

## 2017-08-05 DIAGNOSIS — L820 Inflamed seborrheic keratosis: Secondary | ICD-10-CM | POA: Diagnosis not present

## 2017-08-05 DIAGNOSIS — L718 Other rosacea: Secondary | ICD-10-CM | POA: Diagnosis not present

## 2017-08-05 DIAGNOSIS — L821 Other seborrheic keratosis: Secondary | ICD-10-CM | POA: Diagnosis not present

## 2017-10-01 DIAGNOSIS — E7800 Pure hypercholesterolemia, unspecified: Secondary | ICD-10-CM | POA: Diagnosis not present

## 2017-10-01 DIAGNOSIS — E039 Hypothyroidism, unspecified: Secondary | ICD-10-CM | POA: Diagnosis not present

## 2017-10-01 DIAGNOSIS — R8290 Unspecified abnormal findings in urine: Secondary | ICD-10-CM | POA: Diagnosis not present

## 2017-10-01 DIAGNOSIS — Z79899 Other long term (current) drug therapy: Secondary | ICD-10-CM | POA: Diagnosis not present

## 2017-10-08 DIAGNOSIS — Z79899 Other long term (current) drug therapy: Secondary | ICD-10-CM | POA: Diagnosis not present

## 2017-10-08 DIAGNOSIS — R5382 Chronic fatigue, unspecified: Secondary | ICD-10-CM | POA: Diagnosis not present

## 2017-10-08 DIAGNOSIS — I10 Essential (primary) hypertension: Secondary | ICD-10-CM | POA: Diagnosis not present

## 2017-10-08 DIAGNOSIS — F3341 Major depressive disorder, recurrent, in partial remission: Secondary | ICD-10-CM | POA: Diagnosis not present

## 2017-10-08 DIAGNOSIS — E7800 Pure hypercholesterolemia, unspecified: Secondary | ICD-10-CM | POA: Diagnosis not present

## 2017-10-08 DIAGNOSIS — Z23 Encounter for immunization: Secondary | ICD-10-CM | POA: Diagnosis not present

## 2017-10-08 DIAGNOSIS — E039 Hypothyroidism, unspecified: Secondary | ICD-10-CM | POA: Diagnosis not present

## 2017-10-08 DIAGNOSIS — Z0000 Encounter for general adult medical examination without abnormal findings: Secondary | ICD-10-CM | POA: Diagnosis not present

## 2017-10-08 DIAGNOSIS — I82402 Acute embolism and thrombosis of unspecified deep veins of left lower extremity: Secondary | ICD-10-CM | POA: Diagnosis not present

## 2017-10-08 DIAGNOSIS — E785 Hyperlipidemia, unspecified: Secondary | ICD-10-CM | POA: Diagnosis not present

## 2017-11-24 DIAGNOSIS — Z923 Personal history of irradiation: Secondary | ICD-10-CM

## 2017-11-24 DIAGNOSIS — C50919 Malignant neoplasm of unspecified site of unspecified female breast: Secondary | ICD-10-CM

## 2017-11-24 HISTORY — PX: BREAST LUMPECTOMY: SHX2

## 2017-11-24 HISTORY — DX: Personal history of irradiation: Z92.3

## 2017-11-24 HISTORY — DX: Malignant neoplasm of unspecified site of unspecified female breast: C50.919

## 2017-12-11 ENCOUNTER — Other Ambulatory Visit: Payer: Self-pay | Admitting: Family Medicine

## 2017-12-11 ENCOUNTER — Ambulatory Visit
Admission: RE | Admit: 2017-12-11 | Discharge: 2017-12-11 | Disposition: A | Discharge status: Home / Self Care | Payer: PPO | Source: Ambulatory Visit | Attending: Family Medicine | Admitting: Family Medicine

## 2017-12-11 DIAGNOSIS — R1031 Right lower quadrant pain: Secondary | ICD-10-CM | POA: Insufficient documentation

## 2017-12-11 DIAGNOSIS — R1030 Lower abdominal pain, unspecified: Secondary | ICD-10-CM | POA: Diagnosis not present

## 2017-12-16 DIAGNOSIS — L820 Inflamed seborrheic keratosis: Secondary | ICD-10-CM | POA: Diagnosis not present

## 2017-12-16 DIAGNOSIS — L821 Other seborrheic keratosis: Secondary | ICD-10-CM | POA: Diagnosis not present

## 2017-12-16 DIAGNOSIS — L570 Actinic keratosis: Secondary | ICD-10-CM | POA: Diagnosis not present

## 2017-12-16 DIAGNOSIS — L578 Other skin changes due to chronic exposure to nonionizing radiation: Secondary | ICD-10-CM | POA: Diagnosis not present

## 2017-12-21 ENCOUNTER — Other Ambulatory Visit: Payer: Self-pay | Admitting: Internal Medicine

## 2017-12-21 DIAGNOSIS — N6002 Solitary cyst of left breast: Secondary | ICD-10-CM

## 2017-12-21 DIAGNOSIS — Z1239 Encounter for other screening for malignant neoplasm of breast: Secondary | ICD-10-CM

## 2018-01-11 ENCOUNTER — Ambulatory Visit (INDEPENDENT_AMBULATORY_CARE_PROVIDER_SITE_OTHER): Payer: PPO

## 2018-01-11 ENCOUNTER — Encounter (INDEPENDENT_AMBULATORY_CARE_PROVIDER_SITE_OTHER): Payer: Self-pay | Admitting: Vascular Surgery

## 2018-01-11 ENCOUNTER — Ambulatory Visit (INDEPENDENT_AMBULATORY_CARE_PROVIDER_SITE_OTHER): Payer: PPO | Admitting: Vascular Surgery

## 2018-01-11 VITALS — BP 129/77 | HR 70 | Resp 16 | Ht 62.5 in | Wt 210.0 lb

## 2018-01-11 DIAGNOSIS — E7800 Pure hypercholesterolemia, unspecified: Secondary | ICD-10-CM

## 2018-01-11 DIAGNOSIS — I890 Lymphedema, not elsewhere classified: Secondary | ICD-10-CM

## 2018-01-11 DIAGNOSIS — I82513 Chronic embolism and thrombosis of femoral vein, bilateral: Secondary | ICD-10-CM

## 2018-01-11 DIAGNOSIS — I82402 Acute embolism and thrombosis of unspecified deep veins of left lower extremity: Secondary | ICD-10-CM | POA: Diagnosis not present

## 2018-01-11 DIAGNOSIS — I872 Venous insufficiency (chronic) (peripheral): Secondary | ICD-10-CM | POA: Diagnosis not present

## 2018-01-11 NOTE — Progress Notes (Signed)
MRN : [id number]  Susan Myers is a 67 y.o. (1951/05/02) female who presents with chief complaint of  Chief Complaint  Patient presents with  . Follow-up    1 year IVC fileter check  .  History of Present Illness: The patient returns to the office for followup regarding her IVC filter.  She is status post angiogram with attempted IVC filter removal on 12/16/2016.  Her hematuria has stopped and she is now back on her Xarelto.   Swelling of her left leg remains stable and "not much" at this time.  There have been no significant changes to the patient's overall health care.  The patient denies amaurosis fugax or recent TIA symptoms. There are no recent neurological changes noted. The patient denies history of DVT, PE or superficial thrombophlebitis. The patient denies recent episodes of angina or shortness of breath.      Current Meds  Medication Sig  . hydrochlorothiazide (MICROZIDE) 12.5 MG capsule Take 1 capsule (12.5 mg total) by mouth daily.  Marland Kitchen levothyroxine (SYNTHROID, LEVOTHROID) 100 MCG tablet Take 1 tablet (100 mcg total) by mouth daily before breakfast.  . meloxicam (MOBIC) 7.5 MG tablet Take 7.5 mg by mouth daily as needed for pain.  Marland Kitchen omeprazole (PRILOSEC) 20 MG capsule Take by mouth.  . risperiDONE (RISPERDAL) 1 MG tablet Take 1 mg by mouth at bedtime.  . rivaroxaban (XARELTO) 20 MG TABS tablet Take 20 mg by mouth daily.  . simvastatin (ZOCOR) 20 MG tablet Take 20 mg by mouth every evening.     Past Medical History:  Diagnosis Date  . DVT (deep venous thrombosis) ([place])   . Hypercholesterolemia   . Kidney stones   . Thyroid disease     Past Surgical History:  Procedure Laterality Date  . BREAST CYST ASPIRATION Bilateral 1990  . PERIPHERAL VASCULAR CATHETERIZATION N/A 08/06/2016   Procedure: IVC Filter Insertion;  Surgeon: Katha Cabal, MD;  Location: [facility];  Service: Cardiovascular;  Laterality: N/A;  . PERIPHERAL VASCULAR  CATHETERIZATION Left 08/06/2016   Procedure: Lower Extremity Venography;  Surgeon: Katha Cabal, MD;  Location: [facility];  Service: Cardiovascular;  Laterality: Left;  . PERIPHERAL VASCULAR CATHETERIZATION  08/06/2016   Procedure: Lower Extremity Intervention;  Surgeon: Katha Cabal, MD;  Location: [facility];  Service: Cardiovascular;;  . PERIPHERAL VASCULAR CATHETERIZATION N/A 12/16/2016   Procedure: IVC Filter Removal;  Surgeon: Katha Cabal, MD;  Location: [facility];  Service: Cardiovascular;  Laterality: N/A;    Social History Social History   Tobacco Use  . Smoking status: Never Smoker  . Smokeless tobacco: Never Used  Substance Use Topics  . Alcohol use: No  . Drug use: No    Family History Family History  Problem Relation Age of Onset  . Breast cancer Neg Hx   . Prostate cancer Neg Hx   . Kidney cancer Neg Hx     Allergies  Allergen Reactions  . Levaquin [Levofloxacin In D5w] Hives  . Macrolides And Ketolides     Other reaction(s): Other (See Comments) WHELPS  . Penicillins Itching    Has patient had a PCN reaction causing immediate rash, facial/tongue/throat swelling, SOB or lightheadedness with hypotension:No Has patient had a PCN reaction causing severe rash involving mucus membranes or skin necrosis:unsure Has patient had a PCN reaction that required hospitalization:No Has patient had a PCN reaction occurring within the last 10 years: No If all of the above  answers are "NO", then may proceed with Cephalosporin use.  . Sulfa Antibiotics Hives     REVIEW OF SYSTEMS (Negative unless checked)  Constitutional: [] Weight loss  [] Fever  [] Chills Cardiac: [] Chest pain   [] Chest pressure   [] Palpitations   [] Shortness of breath when laying flat   [] Shortness of breath with exertion. Vascular:  [] Pain in legs with walking   [] Pain in legs at rest  [] History of DVT   [] Phlebitis   [x] Swelling in legs   [] Varicose veins    [] Non-healing ulcers Pulmonary:   [] Uses home oxygen   [] Productive cough   [] Hemoptysis   [] Wheeze  [] COPD   [] Asthma Neurologic:  [] Dizziness   [] Seizures   [] History of stroke   [] History of TIA  [] Aphasia   [] Vissual changes   [] Weakness or numbness in arm   [] Weakness or numbness in leg Musculoskeletal:   [] Joint swelling   [] Joint pain   [] Low back pain Hematologic:  [] Easy bruising  [] Easy bleeding   [] Hypercoagulable state   [] Anemic Gastrointestinal:  [] Diarrhea   [] Vomiting  [] Gastroesophageal reflux/heartburn   [] Difficulty swallowing. Genitourinary:  [] Chronic kidney disease   [] Difficult urination  [] Frequent urination   [] Blood in urine Skin:  [] Rashes   [] Ulcers  Psychological:  [] History of anxiety   []  History of major depression.  Physical Examination  Vitals:   01/11/18 0910  BP: 129/77  Pulse: 70  Resp: 16  Weight: 210 lb (95.3 kg)  Height: 5' 2.5" (1.588 m)   Body mass index is 37.8 kg/m. Gen: WD/WN, NAD Head: /AT, No temporalis wasting.  Ear/Nose/Throat: Hearing grossly intact, nares w/o erythema or drainage Eyes: PER, EOMI, sclera nonicteric.  Neck: Supple, no large masses.   Pulmonary:  Good air movement, no audible wheezing bilaterally, no use of accessory muscles.  Cardiac: RRR, no JVD Vascular: scattered varicosities present bilaterally.  Mild venous stasis changes to the legs bilaterally.  2+ soft pitting edema Vessel Right Left  Radial Palpable Palpable  PT Palpable Palpable  DP Palpable Palpable  Gastrointestinal: Non-distended. No guarding/no peritoneal signs.  Musculoskeletal: M/S 5/5 throughout.  No deformity or atrophy.  Neurologic: CN 2-12 intact. Symmetrical.  Speech is fluent. Motor exam as listed above. Psychiatric: Judgment intact, Mood & affect appropriate for pt's clinical situation. Dermatologic: venous rashes no ulcers noted.  No changes consistent with cellulitis. Lymph : No lichenification or skin changes of chronic  lymphedema.  CBC Lab Results  Component Value Date   WBC 10.5 08/06/2016   HGB 14.2 08/06/2016   HCT 43.0 08/06/2016   MCV 92.8 08/06/2016   PLT 340 08/06/2016    BMET    Component Value Date/Time   NA 143 11/23/2015 2103   K 3.4 (L) 11/23/2015 2103   CL 105 11/23/2015 2103   CO2 28 11/23/2015 2103   GLUCOSE 101 (H) 11/23/2015 2103   BUN 23 (H) 08/06/2016 1426   CREATININE 0.88 08/06/2016 1426   CALCIUM 9.7 11/23/2015 2103   GFRNONAA >60 08/06/2016 1426   GFRAA >60 08/06/2016 1426   CrCl cannot be calculated (Patient's most recent lab result is older than the maximum 21 days allowed.).  COAG Lab Results  Component Value Date   INR 1.33 08/06/2016    Radiology No results found.   Assessment/Plan 1. Chronic deep vein thrombosis (DVT) of femoral vein of both lower extremities (HCC) Recommend:   No surgery or intervention at this point in time.  IVC filter is not able to be removed and will therefore  remain.  Patient's duplex ultrasound of the venous system shows DVT from the popliteal to the femoral veins.  The patient will continue on anticoagulation   Elevation was stressed, use of a recliner was discussed.  I have had a long discussion with the patient regarding DVT and post phlebitic changes such as swelling and why it  causes symptoms such as pain.  The patient will wear graduated compression stockings class 1 (20-30 mmHg), beginning after three full days of anticoagulation, on a daily basis a prescription was given. The patient will  beginning wearing the stockings first thing in the morning and removing them in the evening. The patient is instructed specifically not to sleep in the stockings.  In addition, behavioral modification including elevation during the day and avoidance of prolonged dependency will be initiated.    The patient will continue anticoagulation for now as there have not been any problems or complications at this point.   - VAS US  AORTA/IVC/ILIACS; Future  2. Chronic venous insufficiency See #1  3. Lymphedema No surgery or intervention at this point in time.    I have reviewed my discussion with the patient regarding venous insufficiency and secondary lymph edema and why it  causes symptoms. I have discussed with the patient the chronic skin changes that accompany these problems and the long term sequela such as ulceration and infection.  Patient will continue wearing graduated compression stockings class 1 (20-30 mmHg) on a daily basis a prescription was given to the patient to keep this updated. The patient will  put the stockings on first thing in the morning and removing them in the evening. The patient is instructed specifically not to sleep in the stockings.  In addition, behavioral modification including elevation during the day will be continued.  Diet and salt restriction was also discussed.  Previous duplex ultrasound of the lower extremities shows normal deep venous system, superficial reflux was not present.   Following the review of the ultrasound the patient will follow up in 12 months to reassess the degree of swelling and the control that graduated compression is offering.   The patient can be assessed for a Lymph Pump at that time.  However, at this time the patient states they are satisfied with the control compression and elevation is yielding.   4. Hypercholesteremia Continue statin as ordered and reviewed, no changes at this time     Hortencia Pilar, MD  01/11/2018 9:22 AM

## 2018-03-16 ENCOUNTER — Ambulatory Visit
Admission: RE | Admit: 2018-03-16 | Discharge: 2018-03-16 | Disposition: A | Discharge status: Home / Self Care | Payer: PPO | Source: Ambulatory Visit | Attending: Internal Medicine | Admitting: Internal Medicine

## 2018-03-16 DIAGNOSIS — N6002 Solitary cyst of left breast: Secondary | ICD-10-CM | POA: Insufficient documentation

## 2018-03-16 DIAGNOSIS — Z1239 Encounter for other screening for malignant neoplasm of breast: Secondary | ICD-10-CM

## 2018-03-16 DIAGNOSIS — N6489 Other specified disorders of breast: Secondary | ICD-10-CM | POA: Diagnosis not present

## 2018-03-16 DIAGNOSIS — N6320 Unspecified lump in the left breast, unspecified quadrant: Secondary | ICD-10-CM | POA: Diagnosis not present

## 2018-03-16 DIAGNOSIS — R922 Inconclusive mammogram: Secondary | ICD-10-CM | POA: Diagnosis not present

## 2018-03-17 ENCOUNTER — Other Ambulatory Visit: Payer: Self-pay | Admitting: Internal Medicine

## 2018-03-17 DIAGNOSIS — R928 Other abnormal and inconclusive findings on diagnostic imaging of breast: Secondary | ICD-10-CM

## 2018-03-23 ENCOUNTER — Ambulatory Visit
Admission: RE | Admit: 2018-03-23 | Discharge: 2018-03-23 | Disposition: A | Discharge status: Home / Self Care | Payer: PPO | Source: Ambulatory Visit | Attending: Internal Medicine | Admitting: Internal Medicine

## 2018-03-23 DIAGNOSIS — R928 Other abnormal and inconclusive findings on diagnostic imaging of breast: Secondary | ICD-10-CM | POA: Diagnosis not present

## 2018-03-23 DIAGNOSIS — C50411 Malignant neoplasm of upper-outer quadrant of right female breast: Secondary | ICD-10-CM | POA: Insufficient documentation

## 2018-03-23 DIAGNOSIS — R921 Mammographic calcification found on diagnostic imaging of breast: Secondary | ICD-10-CM | POA: Diagnosis not present

## 2018-03-23 DIAGNOSIS — N6489 Other specified disorders of breast: Secondary | ICD-10-CM | POA: Diagnosis not present

## 2018-03-23 HISTORY — PX: BREAST BIOPSY: SHX20

## 2018-03-24 DIAGNOSIS — C50919 Malignant neoplasm of unspecified site of unspecified female breast: Secondary | ICD-10-CM | POA: Insufficient documentation

## 2018-03-25 ENCOUNTER — Other Ambulatory Visit: Payer: Self-pay

## 2018-03-25 ENCOUNTER — Ambulatory Visit: Payer: Self-pay | Admitting: General Surgery

## 2018-03-25 DIAGNOSIS — E039 Hypothyroidism, unspecified: Secondary | ICD-10-CM | POA: Diagnosis not present

## 2018-03-25 DIAGNOSIS — E7800 Pure hypercholesterolemia, unspecified: Secondary | ICD-10-CM | POA: Diagnosis not present

## 2018-03-25 DIAGNOSIS — I10 Essential (primary) hypertension: Secondary | ICD-10-CM | POA: Diagnosis not present

## 2018-03-25 DIAGNOSIS — C50911 Malignant neoplasm of unspecified site of right female breast: Secondary | ICD-10-CM

## 2018-03-25 DIAGNOSIS — R8290 Unspecified abnormal findings in urine: Secondary | ICD-10-CM | POA: Diagnosis not present

## 2018-03-25 DIAGNOSIS — C50411 Malignant neoplasm of upper-outer quadrant of right female breast: Secondary | ICD-10-CM | POA: Diagnosis not present

## 2018-03-25 DIAGNOSIS — Z79899 Other long term (current) drug therapy: Secondary | ICD-10-CM | POA: Diagnosis not present

## 2018-03-25 LAB — SURGICAL PATHOLOGY

## 2018-03-25 NOTE — H&P (View-Only) (Signed)
PATIENT PROFILE: Susan Myers is a 67 y.o. female who presents to the Clinic for consultation at the request of Dr. Doy Hutching for evaluation of breast cancer.  PCP:  Idelle Crouch, MD  HISTORY OF PRESENT ILLNESS: [name] reports she had her routine mammogram and was found with a suspicious lesion on the upper outer quadrant of the right breast. The mammogram has been done diagnostically due to a cyst found on the left breast. The right breast area of distortion was biopsied by stereotactic core needle biopsy. Pathology results shows an invasive mammary carcinoma of the right breast, 6 mm of invasive carcinoma, grade 2. Breast ultrasound does not show any abnormal lymph node. ER/PR, HER2 receptor studies are in process.   Fist menstrual cycle: 67 years old  Last menstrual period 67 years old No history of radiation to the chest No family history of breast cancer No family history of any type of cancer Used birth control pills from years: 68 to 1975 No other hormonal therapy  Patient has a history of DVT during last pregnancy. Had recurrent DVT in 2017. Has been in anticoagulation and with IVC filter since then. Patient has stopped Xarelto for a week due to hematuria and for a day for breast biopsy.   Patient denies history of ovarian cancer in the past. Refers had hysterectomy for a benign condition.    PROBLEM LIST:         Problem List  Date Reviewed: 12/11/2017         Noted   HTN, goal below 140/80 10/08/2017   Recurrent sinusitis, unspecified 10/07/2016   DVT, lower extremity, left (CMS-HCC) 09/05/2016   DVT, lower extremity, left (CMS-HCC) 09/05/2016   Acute deep vein thrombosis (DVT) of left lower extremity (CMS-HCC) 09/05/2016   History of recurrent UTIs 04/06/2015   Obesity, unspecified Unknown   Environmental allergies Unknown   Fibrositis Unknown   Overview    with chronic pain      Depression, unspecified Unknown   Chronic fatigue,  unspecified Unknown   GERD (gastroesophageal reflux disease) Unknown   Acquired hypothyroidism, unspecified 10/02/2014   Pure hypercholesterolemia 81/11/9145   Lichen sclerosus of female genitalia 09/11/2014      GENERAL REVIEW OF SYSTEMS:   General ROS: negative for - chills, fatigue, fever, weight gain or weight loss Allergy and Immunology ROS: negative for - hives  Hematological and Lymphatic ROS: negative for - bleeding problems or bruising, negative for palpable nodes Endocrine ROS: negative for - heat or cold intolerance, hair changes Respiratory ROS: negative for - cough, shortness of breath or wheezing Cardiovascular ROS: no chest pain or palpitations GI ROS: negative for nausea, vomiting, abdominal pain, diarrhea, constipation Musculoskeletal ROS: negative for - joint swelling or muscle pain Neurological ROS: negative for - confusion, syncope Dermatological ROS: negative for pruritus and rash Psychiatric: negative for anxiety, depression, difficulty sleeping and memory loss  MEDICATIONS: CurrentMedications        Current Outpatient Medications  Medication Sig Dispense Refill  . hydroCHLOROthiazide (HYDRODIURIL) 25 MG tablet Take 1 tablet (25 mg total) by mouth once daily 90 tablet 3  . levothyroxine (SYNTHROID, LEVOTHROID) 100 MCG tablet Take 1 tablet (100 mcg total) by mouth once daily Take on an empty stomach with a glass of water at least 30-60 minutes before breakfast. 90 tablet 3  . meloxicam (MOBIC) 7.5 MG tablet Take 1 tablet (7.5 mg total) by mouth once daily. 90 tablet 1  . omeprazole (PRILOSEC) 20 MG DR capsule Take  20 mg by mouth once daily as needed.    . risperiDONE (RISPERDAL) 1 MG tablet Take 1 mg by mouth once daily    . rivaroxaban (XARELTO) 20 mg tablet Take 1 tablet (20 mg total) by mouth daily with breakfast 30 tablet 11  . simvastatin (ZOCOR) 20 MG tablet Take 1 tablet (20 mg total) by mouth nightly 90 tablet 3   No current  facility-administered medications for this visit.       ALLERGIES: Amoxicillin; Levaquin [levofloxacin]; Macrolide antibiotics; and Sulfa (sulfonamide antibiotics)  PAST MEDICAL HISTORY:     Past Medical History:  Diagnosis Date  . Arthralgia, unspecified   . Breast cancer , unspecified (CMS-HCC) 03/2018  . Bursitis   . Chronic fatigue, unspecified   . Chronic headaches   . Chronic vaginitis   . Chronic vaginitis   . Depression, unspecified   . DVT, lower extremity, left (CMS-HCC)   . Enthesopathy of hip   . Environmental allergies   . Fibrocystic breast disease   . Fibrositis    with chronic pain  . GERD (gastroesophageal reflux disease)   . Hiatal hernia   . Hyperlipidemia, unspecified   . Hypothyroidism, unspecified   . Mitral regurgitation   . Myalgia   . Nephrolithiasis   . Obesity, unspecified   . Osteoarthrosis   . Osteopenia   . Osteopenia   . Plantar fibromatosis   . Recurrent sinusitis, unspecified   . Trigger finger   . Vulvitis     PAST SURGICAL HISTORY:      Past Surgical History:  Procedure Laterality Date  . APPENDECTOMY  1969  . [place] arthroplasty  03/30/2012   left thumb, with interposition arthroplasty   . COLONOSCOPY  11/04/2012   internal hemorrhoids, diverticulosis, repeat 10 years per RTE  . HYSTERECTOMY  10/2007  . HYSTEROSCOPY       FAMILY HISTORY:      Family History  Problem Relation Age of Onset  . No Known Problems Mother   . No Known Problems Maternal Grandmother   . No Known Problems Maternal Grandfather   . No Known Problems Paternal Grandmother   . No Known Problems Paternal Grandfather      SOCIAL HISTORY: Social History          Socioeconomic History  . Marital status: Married    Spouse name: Richard  . Number of children: Not on file  . Years of education: Not on file  . Highest education level: Not on file  Occupational History  . Not on file  Social  Needs  . Financial resource strain: Not on file  . Food insecurity:    Worry: Not on file    Inability: Not on file  . Transportation needs:    Medical: Not on file    Non-medical: Not on file  Tobacco Use  . Smoking status: Never Smoker  . Smokeless tobacco: Never Used  Substance and Sexual Activity  . Alcohol use: No  . Drug use: No  . Sexual activity: Defer  Other Topics Concern  . Not on file  Social History Narrative  . Not on file      PHYSICAL EXAM:    Vitals:   03/25/18 1102  BP: (!) 142/92  Pulse: 79  Temp: 36.3 C (97.4 F)   Body mass index is 36 kg/m. Weight: 90.7 kg (200 lb)   GENERAL: Alert, active, oriented x3  HEENT: Pupils equal reactive to light. Extraocular movements are intact. Sclera clear. Palpebral  conjunctiva normal red color.Pharynx clear.  NECK: Supple with no palpable mass and no adenopathy.  LUNGS: Sound clear with no rales rhonchi or wheezes.  HEART: Regular rhythm S1 and S2 without murmur.  BREAST: both breast were examined on the sitting and supine position. There is nipple retraction of the right nipple but does not seems associated with the lesion. No palpable massess. No skin changes. No ecchymosis or hematoma from biopsy. No axillary lymphadenopathy.   ABDOMEN: Soft and depressible, nontender with no palpable mass, no hepatomegaly. Wounds dry and clean.  EXTREMITIES: Well-developed well-nourished symmetrical with no dependent edema.  NEUROLOGICAL: Awake alert oriented, facial expression symmetrical, moving all extremities.  REVIEW OF DATA: I have reviewed the following data today:      No visits with results within 3 Month(s) from this visit.  Latest known visit with results is:  Appointment on 10/01/2017  Component Date Value  . Glucose 10/01/2017 101   . Sodium 10/01/2017 141   . Potassium 10/01/2017 4.2   . Chloride 10/01/2017 102   . Carbon Dioxide (CO2) 10/01/2017 30.5   . Urea Nitrogen (BUN)  10/01/2017 17   . Creatinine 10/01/2017 0.8   . Glomerular Filtration Ra* 10/01/2017 72   . Calcium 10/01/2017 9.5   . AST  10/01/2017 19   . ALT  10/01/2017 22   . Alk Phos (alkaline Phosp* 10/01/2017 74   . Albumin 10/01/2017 4.3   . Bilirubin, Total 10/01/2017 0.6   . Protein, Total 10/01/2017 6.4   . A/G Ratio 10/01/2017 2.0   . WBC (White Blood Cell Co* 10/01/2017 6.8   . RBC (Red Blood Cell Coun* 10/01/2017 4.82   . Hemoglobin 10/01/2017 14.7   . Hematocrit 10/01/2017 44.6   . MCV (Mean Corpuscular Vo* 10/01/2017 92.5   . MCH (Mean Corpuscular He* 10/01/2017 30.5   . MCHC (Mean Corpuscular H* 10/01/2017 33.0   . Platelet Count 10/01/2017 211   . RDW-CV (Red Cell Distrib* 10/01/2017 14.6   . MPV (Mean Platelet Volum* 10/01/2017 11.6   . Neutrophils 10/01/2017 3.69   . Lymphocytes 10/01/2017 2.31   . Monocytes 10/01/2017 0.59   . Eosinophils 10/01/2017 0.16   . Basophils 10/01/2017 0.03   . Neutrophil % 10/01/2017 54.4   . Lymphocyte % 10/01/2017 34.0   . Monocyte % 10/01/2017 8.7   . Eosinophil % 10/01/2017 2.4   . Basophil% 10/01/2017 0.4   . Immature Granulocyte % 10/01/2017 0.1   . Immature Granulocyte Cou* 10/01/2017 0.01   . Cholesterol, Total 10/01/2017 180   . Triglyceride 10/01/2017 82   . HDL (High Density Lipopr* 10/01/2017 63.8   . LDL (Low Density Lipopro* 10/01/2017 100   . VLDL Cholesterol 10/01/2017 16   . Cholesterol/HDL Ratio 10/01/2017 2.8   . Color 10/01/2017 Yellow   . Clarity 10/01/2017 Cloudy*  . Specific Gravity 10/01/2017 1.020   . pH, Urine 10/01/2017 7.0   . Protein, Urinalysis 10/01/2017 Negative   . Glucose, Urinalysis 10/01/2017 Negative   . Ketones, Urinalysis 10/01/2017 Negative   . Blood, Urinalysis 10/01/2017 Trace*  . Nitrite, Urinalysis 10/01/2017 Negative   . Leukocyte Esterase, Urin* 10/01/2017 Large*  . White Blood Cells, Urina* 10/01/2017 4-10*  . Red Blood Cells, Urinaly* 10/01/2017 0-3   . Bacteria, Urinalysis 10/01/2017  Few*  . Squamous Epithelial Cell* 10/01/2017 Many*  . Thyroid Stimulating Horm* 10/01/2017 5.225   . Thyroxine (T4), Total 10/01/2017 9.8   . Triiodothyronine (T3) Up* 10/01/2017 40.6   . T7 (Free  Thyroxine Index) 10/01/2017 10   . Urine Culture, Routine -* 10/01/2017 Final report*  . Result 1 - [facility] 10/01/2017 Comment*    Surgical Pathology  CASE: [id number]  PATIENT: Wallene Huh  Surgical Pathology Report   SPECIMEN SUBMITTED:  A. Breast, right, UOQ, middle depth   CLINICAL HISTORY:  Screening detected architectural distortion right UOQ without ultrasound  correlate   PRE-OPERATIVE DIAGNOSIS:  Radial scar/CSL vs IMC   POST-OPERATIVE DIAGNOSIS:  None provided.   DIAGNOSIS:  A. RIGHT BREAST, UPPER OUTER QUADRANT, MIDDLE DEPTH; STEREOTACTIC  BIOPSY:  - INVASIVE MAMMARY CARCINOMA, NO SPECIAL TYPE.   Size of invasive carcinoma: 6 mm in this sample  Histologic grade of invasive carcinoma: Grade 2            Glandular/tubular differentiation score: 3            Nuclear pleomorphism score: 2            Mitotic rate score: 1            Total score: 6  Ductal carcinoma in situ: Not identified  Lymphovascular invasion: Not identified   ER/PR/HER2: Immunohistochemistry will be performed on block A2, with  reflex to [place] for HER2 2+. The results will be reported in an addendum.   Comment: The definitive grade will be assigned on the excisional  specimen. The diagnosis was called to Aldona Bar of [facility] on 03/24/2018 at 11 AM. Read-back procedure was performed.   GROSS DESCRIPTION:  A. Labeled: Right breast distortion UOQ  Received: in a formalin-filled [place] collection device  Accompanying specimen radiograph: No  Time/Date in fixative: 8:15 AM on 03/23/2018  Cold ischemic time: 3 minutes  Total fixation time: 8.75 hours  Core pieces: Multiple  Measurement: Aggregate, 4.2 x 1.0 x 0.2 cm   Description / comments: Yellow lobulated fibrofatty  Inked: Blue  Entirely submitted in cassette(s):1-2   Final Diagnosis performed by Delorse Lek, MD.  Electronically signed  03/24/2018 2:06:48PM    ASSESSMENT: Ms. Cockerell is a 67 y.o. female presenting for consultation for breast cancer.    Patient was oriented about diagnosis and pathology was discussed with patient. The treatment of breast cancer was discussed with patient. It was told that this is a multidisciplinary approach. Surgical alternatives were discussed with patient (total mastectomy vs partial mastectomy both with sentinel lymph node biopsy). Patient and her husband agreed that partial mastectomy with sentinel lymph node biopsy is an adequate alternative for her treatment. Surgical technique and risk of surgery were discussed and handout was given to patient.   Patient has appointment with Oncologist Dr. Grayland Ormond on 03/30/18 to further discuss medical oncological alternatives.    PLAN: 1. Right breast Needle guided Partial Mastectomy with Right Axillary sentinel lymph node biopsy ([id number], [id number]) 2. Will discuss with Dr. Delana Meyer recommendations regarding Holding Xarelto before surgery and the need of bridging with Lovenox. Will call you with the recommendations.  3. CBC, CMP 4. Internal Medicine clearance 5. Avoid taking aspirin 5 days before surgery.   Patient and her husband verbalized understanding, all questions were answered, and were agreeable with the plan outlined above.   Herbert Pun, MD  Electronically signed by Herbert Pun, MD

## 2018-03-25 NOTE — H&P (Signed)
PATIENT PROFILE: Susan Myers is a 67 y.o. female who presents to the Clinic for consultation at the request of Dr. Doy Myers for evaluation of breast cancer.  PCP:  Susan Crouch, MD  HISTORY OF PRESENT ILLNESS: Susan Myers reports she had her routine mammogram and was found with a suspicious lesion on the upper outer quadrant of the right breast. The mammogram has been done diagnostically due to a cyst found on the left breast. The right breast area of distortion was biopsied by stereotactic core needle biopsy. Pathology results shows an invasive mammary carcinoma of the right breast, 6 mm of invasive carcinoma, grade 2. Breast ultrasound does not show any abnormal lymph node. ER/PR, HER2 receptor studies are in process.   Fist menstrual cycle: 67 years old  Last menstrual period 67 years old No history of radiation to the chest No family history of breast cancer No family history of any type of cancer Used birth control pills from years: 42 to 1975 No other hormonal therapy  Patient has a history of DVT during last pregnancy. Had recurrent DVT in 2017. Has been in anticoagulation and with IVC filter since then. Patient has stopped Xarelto for a week due to hematuria and for a day for breast biopsy.   Patient denies history of ovarian cancer in the past. Refers had hysterectomy for a benign condition.    PROBLEM LIST:         Problem List  Date Reviewed: 12/11/2017         Noted   HTN, goal below 140/80 10/08/2017   Recurrent sinusitis, unspecified 10/07/2016   DVT, lower extremity, left (CMS-HCC) 09/05/2016   DVT, lower extremity, left (CMS-HCC) 09/05/2016   Acute deep vein thrombosis (DVT) of left lower extremity (CMS-HCC) 09/05/2016   History of recurrent UTIs 04/06/2015   Obesity, unspecified Unknown   Environmental allergies Unknown   Fibrositis Unknown   Overview    with chronic pain      Depression, unspecified Unknown   Chronic fatigue,  unspecified Unknown   GERD (gastroesophageal reflux disease) Unknown   Acquired hypothyroidism, unspecified 10/02/2014   Pure hypercholesterolemia 84/04/6598   Lichen sclerosus of female genitalia 09/11/2014      GENERAL REVIEW OF SYSTEMS:   General ROS: negative for - chills, fatigue, fever, weight gain or weight loss Allergy and Immunology ROS: negative for - hives  Hematological and Lymphatic ROS: negative for - bleeding problems or bruising, negative for palpable nodes Endocrine ROS: negative for - heat or cold intolerance, hair changes Respiratory ROS: negative for - cough, shortness of breath or wheezing Cardiovascular ROS: no chest pain or palpitations GI ROS: negative for nausea, vomiting, abdominal pain, diarrhea, constipation Musculoskeletal ROS: negative for - joint swelling or muscle pain Neurological ROS: negative for - confusion, syncope Dermatological ROS: negative for pruritus and rash Psychiatric: negative for anxiety, depression, difficulty sleeping and memory loss  MEDICATIONS: CurrentMedications        Current Outpatient Medications  Medication Sig Dispense Refill  . hydroCHLOROthiazide (HYDRODIURIL) 25 MG tablet Take 1 tablet (25 mg total) by mouth once daily 90 tablet 3  . levothyroxine (SYNTHROID, LEVOTHROID) 100 MCG tablet Take 1 tablet (100 mcg total) by mouth once daily Take on an empty stomach with a glass of water at least 30-60 minutes before breakfast. 90 tablet 3  . meloxicam (MOBIC) 7.5 MG tablet Take 1 tablet (7.5 mg total) by mouth once daily. 90 tablet 1  . omeprazole (PRILOSEC) 20 MG DR capsule Take  20 mg by mouth once daily as needed.    . risperiDONE (RISPERDAL) 1 MG tablet Take 1 mg by mouth once daily    . rivaroxaban (XARELTO) 20 mg tablet Take 1 tablet (20 mg total) by mouth daily with breakfast 30 tablet 11  . simvastatin (ZOCOR) 20 MG tablet Take 1 tablet (20 mg total) by mouth nightly 90 tablet 3   No current  facility-administered medications for this visit.       ALLERGIES: Amoxicillin; Levaquin [levofloxacin]; Macrolide antibiotics; and Sulfa (sulfonamide antibiotics)  PAST MEDICAL HISTORY:     Past Medical History:  Diagnosis Date  . Arthralgia, unspecified   . Breast cancer , unspecified (CMS-HCC) 03/2018  . Bursitis   . Chronic fatigue, unspecified   . Chronic headaches   . Chronic vaginitis   . Chronic vaginitis   . Depression, unspecified   . DVT, lower extremity, left (CMS-HCC)   . Enthesopathy of hip   . Environmental allergies   . Fibrocystic breast disease   . Fibrositis    with chronic pain  . GERD (gastroesophageal reflux disease)   . Hiatal hernia   . Hyperlipidemia, unspecified   . Hypothyroidism, unspecified   . Mitral regurgitation   . Myalgia   . Nephrolithiasis   . Obesity, unspecified   . Osteoarthrosis   . Osteopenia   . Osteopenia   . Plantar fibromatosis   . Recurrent sinusitis, unspecified   . Trigger finger   . Vulvitis     PAST SURGICAL HISTORY:      Past Surgical History:  Procedure Laterality Date  . APPENDECTOMY  1969  . [place] arthroplasty  03/30/2012   left thumb, with interposition arthroplasty   . COLONOSCOPY  11/04/2012   internal hemorrhoids, diverticulosis, repeat 10 years per RTE  . HYSTERECTOMY  10/2007  . HYSTEROSCOPY       FAMILY HISTORY:      Family History  Problem Relation Age of Onset  . No Known Problems Mother   . No Known Problems Maternal Grandmother   . No Known Problems Maternal Grandfather   . No Known Problems Paternal Grandmother   . No Known Problems Paternal Grandfather      SOCIAL HISTORY: Social History          Socioeconomic History  . Marital status: Married    Spouse name: Susan Myers  . Number of children: Not on file  . Years of education: Not on file  . Highest education level: Not on file  Occupational History  . Not on file  Social  Needs  . Financial resource strain: Not on file  . Food insecurity:    Worry: Not on file    Inability: Not on file  . Transportation needs:    Medical: Not on file    Non-medical: Not on file  Tobacco Use  . Smoking status: Never Smoker  . Smokeless tobacco: Never Used  Substance and Sexual Activity  . Alcohol use: No  . Drug use: No  . Sexual activity: Defer  Other Topics Concern  . Not on file  Social History Narrative  . Not on file      PHYSICAL EXAM:    Vitals:   03/25/18 1102  BP: (!) 142/92  Pulse: 79  Temp: 36.3 C (97.4 F)   Body mass index is 36 kg/m. Weight: 90.7 kg (200 lb)   GENERAL: Alert, active, oriented x3  HEENT: Pupils equal reactive to light. Extraocular movements are intact. Sclera clear. Palpebral  conjunctiva normal red color.Pharynx clear.  NECK: Supple with no palpable mass and no adenopathy.  LUNGS: Sound clear with no rales rhonchi or wheezes.  HEART: Regular rhythm S1 and S2 without murmur.  BREAST: both breast were examined on the sitting and supine position. There is nipple retraction of the right nipple but does not seems associated with the lesion. No palpable massess. No skin changes. No ecchymosis or hematoma from biopsy. No axillary lymphadenopathy.   ABDOMEN: Soft and depressible, nontender with no palpable mass, no hepatomegaly. Wounds dry and clean.  EXTREMITIES: Well-developed well-nourished symmetrical with no dependent edema.  NEUROLOGICAL: Awake alert oriented, facial expression symmetrical, moving all extremities.  REVIEW OF DATA: I have reviewed the following data today:      No visits with results within 3 Month(s) from this visit.  Latest known visit with results is:  Appointment on 10/01/2017  Component Date Value  . Glucose 10/01/2017 101   . Sodium 10/01/2017 141   . Potassium 10/01/2017 4.2   . Chloride 10/01/2017 102   . Carbon Dioxide (CO2) 10/01/2017 30.5   . Urea Nitrogen (BUN)  10/01/2017 17   . Creatinine 10/01/2017 0.8   . Glomerular Filtration Ra* 10/01/2017 72   . Calcium 10/01/2017 9.5   . AST  10/01/2017 19   . ALT  10/01/2017 22   . Alk Phos (alkaline Phosp* 10/01/2017 74   . Albumin 10/01/2017 4.3   . Bilirubin, Total 10/01/2017 0.6   . Protein, Total 10/01/2017 6.4   . A/G Ratio 10/01/2017 2.0   . WBC (White Blood Cell Co* 10/01/2017 6.8   . RBC (Red Blood Cell Coun* 10/01/2017 4.82   . Hemoglobin 10/01/2017 14.7   . Hematocrit 10/01/2017 44.6   . MCV (Mean Corpuscular Vo* 10/01/2017 92.5   . MCH (Mean Corpuscular He* 10/01/2017 30.5   . MCHC (Mean Corpuscular H* 10/01/2017 33.0   . Platelet Count 10/01/2017 211   . RDW-CV (Red Cell Distrib* 10/01/2017 14.6   . MPV (Mean Platelet Volum* 10/01/2017 11.6   . Neutrophils 10/01/2017 3.69   . Lymphocytes 10/01/2017 2.31   . Monocytes 10/01/2017 0.59   . Eosinophils 10/01/2017 0.16   . Basophils 10/01/2017 0.03   . Neutrophil % 10/01/2017 54.4   . Lymphocyte % 10/01/2017 34.0   . Monocyte % 10/01/2017 8.7   . Eosinophil % 10/01/2017 2.4   . Basophil% 10/01/2017 0.4   . Immature Granulocyte % 10/01/2017 0.1   . Immature Granulocyte Cou* 10/01/2017 0.01   . Cholesterol, Total 10/01/2017 180   . Triglyceride 10/01/2017 82   . HDL (High Density Lipopr* 10/01/2017 63.8   . LDL (Low Density Lipopro* 10/01/2017 100   . VLDL Cholesterol 10/01/2017 16   . Cholesterol/HDL Ratio 10/01/2017 2.8   . Color 10/01/2017 Yellow   . Clarity 10/01/2017 Cloudy*  . Specific Gravity 10/01/2017 1.020   . pH, Urine 10/01/2017 7.0   . Protein, Urinalysis 10/01/2017 Negative   . Glucose, Urinalysis 10/01/2017 Negative   . Ketones, Urinalysis 10/01/2017 Negative   . Blood, Urinalysis 10/01/2017 Trace*  . Nitrite, Urinalysis 10/01/2017 Negative   . Leukocyte Esterase, Urin* 10/01/2017 Large*  . White Blood Cells, Urina* 10/01/2017 4-10*  . Red Blood Cells, Urinaly* 10/01/2017 0-3   . Bacteria, Urinalysis 10/01/2017  Few*  . Squamous Epithelial Cell* 10/01/2017 Many*  . Thyroid Stimulating Horm* 10/01/2017 5.225   . Thyroxine (T4), Total 10/01/2017 9.8   . Triiodothyronine (T3) Up* 10/01/2017 40.6   . T7 (Free  Thyroxine Index) 10/01/2017 10   . Urine Culture, Routine -* 10/01/2017 Final report*  . Result 1 - [facility] 10/01/2017 Comment*    Surgical Pathology  CASE: [id number]  PATIENT: Wallene Huh  Surgical Pathology Report   SPECIMEN SUBMITTED:  A. Breast, right, UOQ, middle depth   CLINICAL HISTORY:  Screening detected architectural distortion right UOQ without ultrasound  correlate   PRE-OPERATIVE DIAGNOSIS:  Radial scar/CSL vs IMC   POST-OPERATIVE DIAGNOSIS:  None provided.   DIAGNOSIS:  A. RIGHT BREAST, UPPER OUTER QUADRANT, MIDDLE DEPTH; STEREOTACTIC  BIOPSY:  - INVASIVE MAMMARY CARCINOMA, NO SPECIAL TYPE.   Size of invasive carcinoma: 6 mm in this sample  Histologic grade of invasive carcinoma: Grade 2            Glandular/tubular differentiation score: 3            Nuclear pleomorphism score: 2            Mitotic rate score: 1            Total score: 6  Ductal carcinoma in situ: Not identified  Lymphovascular invasion: Not identified   ER/PR/HER2: Immunohistochemistry will be performed on block A2, with  reflex to [place] for HER2 2+. The results will be reported in an addendum.   Comment: The definitive grade will be assigned on the excisional  specimen. The diagnosis was called to Aldona Bar of [facility] on 03/24/2018 at 11 AM. Read-back procedure was performed.   GROSS DESCRIPTION:  A. Labeled: Right breast distortion UOQ  Received: in a formalin-filled [place] collection device  Accompanying specimen radiograph: No  Time/Date in fixative: 8:15 AM on 03/23/2018  Cold ischemic time: 3 minutes  Total fixation time: 8.75 hours  Core pieces: Multiple  Measurement: Aggregate, 4.2 x 1.0 x 0.2 cm   Description / comments: Yellow lobulated fibrofatty  Inked: Blue  Entirely submitted in cassette(s):1-2   Final Diagnosis performed by Delorse Lek, MD.  Electronically signed  03/24/2018 2:06:48PM    ASSESSMENT: Ms. Benge is a 67 y.o. female presenting for consultation for breast cancer.    Patient was oriented about diagnosis and pathology was discussed with patient. The treatment of breast cancer was discussed with patient. It was told that this is a multidisciplinary approach. Surgical alternatives were discussed with patient (total mastectomy vs partial mastectomy both with sentinel lymph node biopsy). Patient and her husband agreed that partial mastectomy with sentinel lymph node biopsy is an adequate alternative for her treatment. Surgical technique and risk of surgery were discussed and handout was given to patient.   Patient has appointment with Oncologist Dr. Grayland Ormond on 03/30/18 to further discuss medical oncological alternatives.    PLAN: 1. Right breast Needle guided Partial Mastectomy with Right Axillary sentinel lymph node biopsy (19301, [id number]) 2. Will discuss with Dr. Delana Meyer recommendations regarding Holding Xarelto before surgery and the need of bridging with Lovenox. Will call you with the recommendations.  3. CBC, CMP 4. Internal Medicine clearance 5. Avoid taking aspirin 5 days before surgery.   Patient and her husband verbalized understanding, all questions were answered, and were agreeable with the plan outlined above.   Herbert Pun, MD  Electronically signed by Herbert Pun, MD

## 2018-03-25 NOTE — Progress Notes (Signed)
  Oncology Nurse Navigator Documentation  Navigator Location: [facility] (03/25/18 1000) Referral date to RadOnc/MedOnc: 03/30/18 (03/25/18 1000) )Navigator Encounter Type: Introductory phone call (03/25/18 1000)   Abnormal Finding Date: 03/16/18 (03/25/18 1000) Confirmed Diagnosis Date: 03/23/18 (03/25/18 1000)               Patient Visit Type: Initial (03/25/18 1000) Treatment Phase: Pre-Tx/Tx Discussion (03/25/18 1000) Barriers/Navigation Needs: Coordination of Care;Education (03/25/18 1000) Education: Accessing Care/ Finding Providers;Coping with Diagnosis/ Prognosis;Newly Diagnosed Cancer Education (03/25/18 1000) Interventions: Coordination of Care;Education;Referrals (03/25/18 1000)   Coordination of Care: Appts (03/25/18 1000) Education Method: Written (03/25/18 1000)                Time Spent with Patient: 75 (03/25/18 1000)  Introduced IT trainer.  Scheduled surgical nd Med/Onc consults.  Patient given Breast Cancer Treatment Handbook/folder with hospital services at initial visit with Dr. Peyton Najjar.  Notified Dr. Doy Hutching of referrals.

## 2018-03-26 ENCOUNTER — Other Ambulatory Visit: Payer: Self-pay | Admitting: General Surgery

## 2018-03-26 DIAGNOSIS — C50411 Malignant neoplasm of upper-outer quadrant of right female breast: Secondary | ICD-10-CM | POA: Insufficient documentation

## 2018-03-26 DIAGNOSIS — R8290 Unspecified abnormal findings in urine: Secondary | ICD-10-CM | POA: Diagnosis not present

## 2018-03-26 NOTE — Progress Notes (Signed)
[place]  [phone]) [phone] Fax:(336) [phone]  ID: Susan Myers OB: 01-Oct-1951  MR#: [id number]  LKG#:[id number]  Patient Care Team: Idelle Crouch, MD as PCP - General (Internal Medicine)  CHIEF COMPLAINT: Clinical stage IA ER/PR positive, HER-2 negative invasive carcinoma of the upper outer quadrant of the right breast.  INTERVAL HISTORY: Patient is a 67 year old female who was noted to have an abnormality in routine screening mammogram.  Subsequent biopsy revealed the above-stated breast cancer.  She is anxious, but otherwise feels well.  She has no neurologic complaints.  She denies any recent fevers or illnesses.  She has a good appetite and denies weight loss.  She has no chest pain or shortness of breath.  She denies any nausea, vomiting, constipation, or diarrhea.  She has no urinary complaints.  Patient feels at her baseline offers no further specific complaints.  REVIEW OF SYSTEMS:   Review of Systems  Constitutional: Negative.  Negative for fever, malaise/fatigue and weight loss.  Respiratory: Negative.  Negative for cough and shortness of breath.   Cardiovascular: Negative.  Negative for chest pain and leg swelling.  Gastrointestinal: Negative.  Negative for abdominal pain.  Genitourinary: Negative.  Negative for dysuria.  Musculoskeletal: Negative.  Negative for back pain.  Skin: Negative.  Negative for rash.  Neurological: Negative.  Negative for sensory change, focal weakness and weakness.  Psychiatric/Behavioral: Negative.  The patient is not nervous/anxious.     As per HPI. Otherwise, a complete review of systems is negative.  PAST MEDICAL HISTORY: Past Medical History:  Diagnosis Date  . Complication of anesthesia   . Delusional disorder ([place])   . DVT (deep venous thrombosis) ([place]) 06/2016  . GERD (gastroesophageal reflux disease)   . History of kidney stones   . Hx of blood clots   . Hypercholesterolemia   .  Hypothyroidism   . Kidney stones   . PONV (postoperative nausea and vomiting)   . Thyroid disease     PAST SURGICAL HISTORY: Past Surgical History:  Procedure Laterality Date  . ABDOMINAL HYSTERECTOMY    . APPENDECTOMY    . BREAST BIOPSY Right 03/23/2018   Affirm Bx- path pending- X clip  . BREAST CYST ASPIRATION Bilateral 1990  . COLONOSCOPY    . IVC FILTER INSERTION    . PARTIAL MASTECTOMY WITH NEEDLE LOCALIZATION Right 04/01/2018   Procedure: PARTIAL MASTECTOMY WITH NEEDLE LOCALIZATION;  Surgeon: Herbert Pun, MD;  Location: [facility];  Service: General;  Laterality: Right;  . PERIPHERAL VASCULAR CATHETERIZATION N/A 08/06/2016   Procedure: IVC Filter Insertion;  Surgeon: Katha Cabal, MD;  Location: [place] [facility];  Service: Cardiovascular;  Laterality: N/A;  . PERIPHERAL VASCULAR CATHETERIZATION Left 08/06/2016   Procedure: Lower Extremity Venography;  Surgeon: Katha Cabal, MD;  Location: [place] [facility];  Service: Cardiovascular;  Laterality: Left;  . PERIPHERAL VASCULAR CATHETERIZATION  08/06/2016   Procedure: Lower Extremity Intervention;  Surgeon: Katha Cabal, MD;  Location: [place] [facility];  Service: Cardiovascular;;  . PERIPHERAL VASCULAR CATHETERIZATION N/A 12/16/2016   Procedure: IVC Filter Removal;  Surgeon: Katha Cabal, MD;  Location: [place] [facility];  Service: Cardiovascular;  Laterality: N/A;  . SENTINEL NODE BIOPSY Right 04/01/2018   Procedure: SENTINEL NODE BIOPSY;  Surgeon: Herbert Pun, MD;  Location: [facility];  Service: General;  Laterality: Right;  . TONSILLECTOMY      FAMILY HISTORY: Family History  Problem Relation Age of Onset  . Breast cancer Neg Hx   .  Prostate cancer Neg Hx   . Kidney cancer Neg Hx     ADVANCED DIRECTIVES (Y/N):  N  HEALTH MAINTENANCE: Social History   Tobacco Use  . Smoking status: Never Smoker  . Smokeless tobacco: Never Used  Substance Use Topics  . Alcohol use: No  .  Drug use: No     Colonoscopy:  PAP:  Bone density:  Lipid panel:  Allergies  Allergen Reactions  . Levaquin [Levofloxacin In D5w] Hives  . Macrolides And Ketolides     WHELPS  . Penicillins Itching    Can take amoxicillin  Has patient had a PCN reaction causing immediate rash, facial/tongue/throat swelling, SOB or lightheadedness with hypotension:No Has patient had a PCN reaction causing severe rash involving mucus membranes or skin necrosis:unsure Has patient had a PCN reaction that required hospitalization:No Has patient had a PCN reaction occurring within the last 10 years: No If all of the above answers are "NO", then may proceed with Cephalosporin use.  . Sulfa Antibiotics Hives    Current Outpatient Medications  Medication Sig Dispense Refill  . acetaminophen (TYLENOL) 500 MG tablet Take 500 mg by mouth every 6 (six) hours as needed for moderate pain or headache.    . Cholecalciferol (VITAMIN D PO) Take 1 tablet by mouth once a week.    . diphenhydrAMINE (BENADRYL) 25 MG tablet Take 25 mg by mouth at bedtime as needed for sleep.    . hydrochlorothiazide (HYDRODIURIL) 25 MG tablet Take 25 mg by mouth daily.    Marland Kitchen levothyroxine (SYNTHROID, LEVOTHROID) 100 MCG tablet Take 1 tablet (100 mcg total) by mouth daily before breakfast. 30 tablet 0  . meloxicam (MOBIC) 7.5 MG tablet Take 7.5 mg by mouth daily as needed for pain.    Marland Kitchen omeprazole (PRILOSEC) 20 MG capsule Take 20 mg by mouth daily as needed (acid reflux).     . phenylephrine (SUDAFED PE) 10 MG TABS tablet Take 10 mg by mouth every 6 (six) hours as needed (congestion).    . risperiDONE (RISPERDAL) 1 MG tablet Take 1 mg by mouth at bedtime.    . rivaroxaban (XARELTO) 20 MG TABS tablet Take 20 mg by mouth daily.    . simvastatin (ZOCOR) 20 MG tablet Take 20 mg by mouth every evening.     Marland Kitchen HYDROcodone-acetaminophen (NORCO) 5-325 MG tablet Take 1 tablet by mouth every 4 (four) hours as needed for up to 3 days for moderate  pain. 12 tablet 0   No current facility-administered medications for this visit.    Facility-Administered Medications Ordered in Other Visits  Medication Dose Route Frequency Provider Last Rate Last Dose  . dexamethasone (DECADRON) injection   Intravenous Anesthesia Intra-op Patience Musca., CRNA   10 mg at 04/01/18 1128  . dexmedetomidine (PRECEDEX) injection    Anesthesia Intra-op Patience Musca., CRNA   4 mcg at 04/01/18 1131  . fentaNYL (SUBLIMAZE) injection    Anesthesia Intra-op Patience Musca., CRNA   100 mcg at 04/01/18 1109  . glycopyrrolate (ROBINUL) injection    Anesthesia Intra-op Patience Musca., CRNA   0.2 mg at 04/01/18 1134  . lidocaine (cardiac) 100 mg/36m (XYLOCAINE) injection 2%   Intravenous Anesthesia Intra-op FPatience Musca, CRNA   100 mg at 04/01/18 1115  . midazolam (VERSED) injection    Anesthesia Intra-op FPatience Musca, CRNA   2 mg at 04/01/18 1109  . ondansetron (ZOFRAN) injection   Intravenous Anesthesia Intra-op FPatience Musca, CRNA   4 mg  at 04/01/18 1128  . phenylephrine (NEO-SYNEPHRINE) injection   Intravenous Anesthesia Intra-op Patience Musca., CRNA   100 mcg at 04/01/18 1146  . propofol (DIPRIVAN) 10 mg/mL bolus/IV push    Anesthesia Intra-op Patience Musca., CRNA   100 mg at 04/01/18 1118  . propofol (DIPRIVAN) 500 MG/50ML infusion    Continuous PRN Patience Musca., CRNA   Stopped at 04/01/18 1303  . succinylcholine (ANECTINE) injection   Intravenous Anesthesia Intra-op Patience Musca., CRNA   100 mg at 04/01/18 1118    OBJECTIVE: Vitals:   03/30/18 1537  BP: (!) 150/96  Pulse: 77  Resp: 18  Temp: 98.1 F (36.7 C)     Body mass index is 38.02 kg/m.    ECOG FS:0 - Asymptomatic  General: Well-developed, well-nourished, no acute distress. Eyes: Pink conjunctiva, anicteric sclera. Breast: Patient requested exam be deferred today. HEENT: Normocephalic, moist mucous membranes, clear  oropharnyx. Lungs: Clear to auscultation bilaterally. Heart: Regular rate and rhythm. No rubs, murmurs, or gallops. Abdomen: Soft, nontender, nondistended. No organomegaly noted, normoactive bowel sounds. Musculoskeletal: No edema, cyanosis, or clubbing. Neuro: Alert, answering all questions appropriately. Cranial nerves grossly intact. Skin: No rashes or petechiae noted. Psych: Normal affect. Lymphatics: No cervical, calvicular, axillary or inguinal LAD.   LAB RESULTS:  Lab Results  Component Value Date   NA 143 11/23/2015   K 3.4 (L) 11/23/2015   CL 105 11/23/2015   CO2 28 11/23/2015   GLUCOSE 101 (H) 11/23/2015   BUN 23 (H) 08/06/2016   CREATININE 0.88 08/06/2016   CALCIUM 9.7 11/23/2015   PROT 7.5 11/23/2015   ALBUMIN 4.8 11/23/2015   AST 22 11/23/2015   ALT 24 11/23/2015   ALKPHOS 88 11/23/2015   BILITOT 0.8 11/23/2015   GFRNONAA >60 08/06/2016   GFRAA >60 08/06/2016    Lab Results  Component Value Date   WBC 10.5 08/06/2016   HGB 14.2 08/06/2016   HCT 43.0 08/06/2016   MCV 92.8 08/06/2016   PLT 340 08/06/2016     STUDIES: Nm Sentinel Node Injection  Result Date: 04/01/2018 CLINICAL DATA:  Right breast cancer. EXAM: NUCLEAR MEDICINE BREAST LYMPHOSCINTIGRAPHY TECHNIQUE: Intradermal injection of radiopharmaceutical was performed at the 12 o'clock, 3 o'clock, 6 o'clock, and 9 o'clock positions around the right nipple. The patient was then sent to the operating room where the sentinel node(s) were identified and removed by the surgeon. RADIOPHARMACEUTICALS:  Total of 0.776 mCi Millipore-filtered Technetium-51msulfur colloid, injected in four aliquots. IMPRESSION: Uncomplicated intradermal injection of Technetium-938mulfur colloid for purposes of sentinel node identification. Electronically Signed   By: AdMarkus Daft.D.   On: 04/01/2018 09:25   Mm Breast Surgical Specimen  Result Date: 04/01/2018 CLINICAL DATA:  Right lumpectomy for recently diagnosed invasive mammary  carcinoma in the upper-outer quadrant of the breast. EXAM: SPECIMEN RADIOGRAPH OF THE RIGHT BREAST COMPARISON:  Previous exam(s). FINDINGS: Status post excision of the right breast. The wire tip and X shaped biopsy marker clip are present. IMPRESSION: Specimen radiograph of the right breast. Electronically Signed   By: StClaudie Revering.D.   On: 04/01/2018 12:25   UsKoreareast Ltd Uni Left Inc Axilla  Result Date: 03/16/2018 CLINICAL DATA:  Delayed follow-up of probably benign left breast 3 o'clock mass. EXAM: DIGITAL DIAGNOSTIC BILATERAL MAMMOGRAM WITH CAD AND TOMO ULTRASOUND BILATERAL BREAST COMPARISON:  Previous exam(s). ACR Breast Density Category c: The breast tissue is heterogeneously dense, which may obscure small masses. FINDINGS: Mammographically, there is an area of  architectural distortion in the right breast slightly upper outer quadrant, anterior depth. No suspicious masses are seen in the left breast. Mammographic images were processed with CAD. On physical exam, no suspicious masses are palpated. Targeted right breast ultrasound is performed, showing a subtle hypoechoic area in the right breast 11 o'clock 3 cm from the nipple measuring approximately 6 mm. The finding is difficult to reproduce in anti radial position, but likely corresponds to the area of distortion seen mammographically. No evidence of right axillary lymphadenopathy. Targeted left breast ultrasound demonstrates interval resolution of previously seen probably benign 3 o'clock mass. Benign-appearing subcentimeter cyst is seen in the left breast slightly lower outer quadrant. IMPRESSION: No mammographic or sonographic evidence of malignancy in the left breast. Right breast slightly upper outer quadrant, anterior depth, area of distortion, better seen mammographically, for which stereotactic core needle biopsy is recommended. No evidence of right axillary lymphadenopathy. RECOMMENDATION: Stereotactic core needle biopsy of the right breast.  I have discussed the findings and recommendations with the patient. Results were also provided in writing at the conclusion of the visit. If applicable, a reminder letter will be sent to the patient regarding the next appointment. BI-RADS CATEGORY  4: Suspicious. Electronically Signed   By: Fidela Salisbury M.D.   On: 03/16/2018 13:04   US Breast Ltd Uni Right Inc Axilla  Result Date: 03/16/2018 CLINICAL DATA:  Delayed follow-up of probably benign left breast 3 o'clock mass. EXAM: DIGITAL DIAGNOSTIC BILATERAL MAMMOGRAM WITH CAD AND TOMO ULTRASOUND BILATERAL BREAST COMPARISON:  Previous exam(s). ACR Breast Density Category c: The breast tissue is heterogeneously dense, which may obscure small masses. FINDINGS: Mammographically, there is an area of architectural distortion in the right breast slightly upper outer quadrant, anterior depth. No suspicious masses are seen in the left breast. Mammographic images were processed with CAD. On physical exam, no suspicious masses are palpated. Targeted right breast ultrasound is performed, showing a subtle hypoechoic area in the right breast 11 o'clock 3 cm from the nipple measuring approximately 6 mm. The finding is difficult to reproduce in anti radial position, but likely corresponds to the area of distortion seen mammographically. No evidence of right axillary lymphadenopathy. Targeted left breast ultrasound demonstrates interval resolution of previously seen probably benign 3 o'clock mass. Benign-appearing subcentimeter cyst is seen in the left breast slightly lower outer quadrant. IMPRESSION: No mammographic or sonographic evidence of malignancy in the left breast. Right breast slightly upper outer quadrant, anterior depth, area of distortion, better seen mammographically, for which stereotactic core needle biopsy is recommended. No evidence of right axillary lymphadenopathy. RECOMMENDATION: Stereotactic core needle biopsy of the right breast. I have discussed the  findings and recommendations with the patient. Results were also provided in writing at the conclusion of the visit. If applicable, a reminder letter will be sent to the patient regarding the next appointment. BI-RADS CATEGORY  4: Suspicious. Electronically Signed   By: Fidela Salisbury M.D.   On: 03/16/2018 13:04   Mm Diag Breast Tomo Bilateral  Result Date: 03/16/2018 CLINICAL DATA:  Delayed follow-up of probably benign left breast 3 o'clock mass. EXAM: DIGITAL DIAGNOSTIC BILATERAL MAMMOGRAM WITH CAD AND TOMO ULTRASOUND BILATERAL BREAST COMPARISON:  Previous exam(s). ACR Breast Density Category c: The breast tissue is heterogeneously dense, which may obscure small masses. FINDINGS: Mammographically, there is an area of architectural distortion in the right breast slightly upper outer quadrant, anterior depth. No suspicious masses are seen in the left breast. Mammographic images were processed with CAD. On physical exam,  no suspicious masses are palpated. Targeted right breast ultrasound is performed, showing a subtle hypoechoic area in the right breast 11 o'clock 3 cm from the nipple measuring approximately 6 mm. The finding is difficult to reproduce in anti radial position, but likely corresponds to the area of distortion seen mammographically. No evidence of right axillary lymphadenopathy. Targeted left breast ultrasound demonstrates interval resolution of previously seen probably benign 3 o'clock mass. Benign-appearing subcentimeter cyst is seen in the left breast slightly lower outer quadrant. IMPRESSION: No mammographic or sonographic evidence of malignancy in the left breast. Right breast slightly upper outer quadrant, anterior depth, area of distortion, better seen mammographically, for which stereotactic core needle biopsy is recommended. No evidence of right axillary lymphadenopathy. RECOMMENDATION: Stereotactic core needle biopsy of the right breast. I have discussed the findings and  recommendations with the patient. Results were also provided in writing at the conclusion of the visit. If applicable, a reminder letter will be sent to the patient regarding the next appointment. BI-RADS CATEGORY  4: Suspicious. Electronically Signed   By: Fidela Salisbury M.D.   On: 03/16/2018 13:04   Mm Clip Placement Right  Result Date: 03/23/2018 CLINICAL DATA:  Confirmation of clip placement after stereotactic tomosynthesis core needle biopsy architectural distortion involving the UPPER OUTER QUADRANT of the RIGHT breast at MIDDLE depth. EXAM: DIAGNOSTIC RIGHT MAMMOGRAM POST STEREOTACTIC BIOPSY COMPARISON:  Previous exam(s). FINDINGS: Mammographic images were obtained following stereotactic tomosynthesis guided biopsy of architectural distortion involving the UPPER OUTER QUADRANT of the RIGHT breast at MIDDLE depth. The X shaped tissue marker clip is positioned at the SUPERIOR and POSTERIOR margin of the biopsied architectural distortion, approximately 10 mm from the center of the distortion. Expected post biopsy changes are present without evidence of hematoma. IMPRESSION: Appropriate positioning of the X shaped tissue marker clip at the SUPERIOR and POSTERIOR margin of the biopsied architectural distortion in the UPPER OUTER QUADRANT of the RIGHT breast. Final Assessment: Post Procedure Mammograms for Marker Placement Electronically Signed   By: Evangeline Dakin M.D.   On: 03/23/2018 08:35   Mm Rt Breast Bx W Loc Dev 1st Lesion Image Bx Spec Stereo Guide  Addendum Date: 03/25/2018   ADDENDUM REPORT: 03/25/2018 16:22 ADDENDUM: Pathology of the right breast biopsy revealed A. RIGHT BREAST, UPPER OUTER QUADRANT, MIDDLE DEPTH; STEREOTACTIC BIOPSY: INVASIVE MAMMARY CARCINOMA, NO SPECIAL TYPE. Size of invasive carcinoma: 6 mm in this sample. Histologic grade of invasive carcinoma: Grade 2. Ductal carcinoma in situ: Not identified. Lymphovascular invasion: Not identified. ER/PR/HER2: Immunohistochemistry  will be performed on block A2, with reflex to [place] for HER2 2+. The results will be reported in an addendum. Comment: The definitive grade will be assigned on the excisional specimen. The diagnosis was called to Aldona Bar of [facility] on 03/24/2018 at 11 AM. Read-back procedure was performed. This was found to be concordant with Dr. Elna Breslow impression and notes. Recommendation: Surgical and oncology referrals. At the patient's request, results and recommendations were discussed with the patient and her husband by phone by Dr. Jetta Lout on 03/24/18. The patient stated she did well following the biopsy. All of their questions were answered. They were informed that the nurse navigator for [facility] will contact them and arrange referrals. Request for referral was relayed to Al Pimple, RN, nurse navigator by Jetta Lout, RRA. Appointments were made with Dr. Peyton Najjar, surgeon for 03/25/18 at 11:00 AM and Dr. Grayland Ormond, oncologist for 03/30/18 at 3:00 PM by Al Pimple, RN. The patient was  notified of the appointment information by Al Pimple, RN. Addendum by Jetta Lout, RRA on 03/25/18. Electronically Signed   By: Fidela Salisbury M.D.   On: 03/25/2018 16:22   Result Date: 03/25/2018 CLINICAL DATA:  67 year old with screening detected architectural distortion involving the UPPER OUTER QUADRANT of the RIGHT breast at MIDDLE depth, without ultrasound correlate. EXAM: RIGHT BREAST STEREOTACTIC CORE NEEDLE BIOPSY COMPARISON:  Previous exams. FINDINGS: The patient and I discussed the procedure of stereotactic-guided biopsy including benefits and alternatives. We discussed the high likelihood of a successful procedure. We discussed the risks of the procedure including infection, bleeding, tissue injury, clip migration, and inadequate sampling. Informed written consent was given. The usual time out protocol was performed immediately prior to the procedure. Using sterile technique with  chlorhexidine as skin antisepsis, 1% lidocaine and 1% lidocaine with epinephrine as local anesthetic, under stereotactic guidance, a 9 gauge Brevera vacuum assisted device was used to perform core needle biopsy of architectural distortion involving the UPPER OUTER QUADRANT of the RIGHT breast at MIDDLE depth using a SUPERIOR approach. Specimen radiograph was performed showing soft tissue and scattered calcifications in multiple samples. Lesion quadrant: UPPER OUTER QUADRANT. At the conclusion of the procedure, an X shaped tissue marker clip was deployed into the biopsy cavity. Follow-up 2-view mammogram was performed and dictated separately. IMPRESSION: Stereotactic-guided biopsy of architectural distortion involving the UPPER OUTER QUADRANT of the RIGHT breast at MIDDLE depth. No apparent complications. Electronically Signed: By: Evangeline Dakin M.D. On: 03/23/2018 08:25   Mm Rt Plc Breast Loc Dev   1st Lesion  Inc Mammo Guide  Result Date: 04/01/2018 CLINICAL DATA:  Recently diagnosed invasive mammary carcinoma in the upper-outer quadrant of the right breast. EXAM: NEEDLE LOCALIZATION OF THE RIGHT BREAST WITH MAMMO GUIDANCE COMPARISON:  Previous exams. FINDINGS: Patient presents for needle localization prior to right lumpectomy. I met with the patient and we discussed the procedure of needle localization including benefits and alternatives. We discussed the high likelihood of a successful procedure. We discussed the risks of the procedure, including infection, bleeding, tissue injury, and further surgery. Informed, written consent was given. The usual time-out protocol was performed immediately prior to the procedure. Using mammographic guidance, sterile technique, 1% lidocaine and a 7 cm modified Kopans needle, the recently placed X shaped biopsy marker clip in the upper-outer quadrant of the right breast was localized using cephalad approach. The images were marked for Dr. Windell Moment. IMPRESSION: Needle  localization right breast. No apparent complications. Electronically Signed   By: Claudie Revering M.D.   On: 04/01/2018 09:26    ASSESSMENT: Clinical stage IA ER/PR positive, HER-2 negative invasive carcinoma of the upper outer quadrant of the right breast.  PLAN:    1. Clinical stage IA ER/PR positive, HER-2 negative invasive carcinoma of the upper outer quadrant of the right breast: Given the size and stage of patient's tumor, patient has elected to proceed with lumpectomy which is scheduled for Apr 01, 2018.  Given the small size of tumor, Oncotype DX testing may not be necessary, but will wait for final pathology.  Patient will require adjuvant XRT and will arrange follow-up with radiation oncology postoperatively.  Finally, given the ER/PR status of her tumor she will benefit from an aromatase inhibitor for a total of 5 years.  Return to clinic on Apr 15, 2018 for further evaluation and discussion of her final pathology results.  Approximately 60 minutes was spent in discussion of which greater than 50% was consultation.   Patient  expressed understanding and was in agreement with this plan. She also understands that She can call clinic at any time with any questions, concerns, or complaints.   Cancer Staging Primary cancer of upper outer quadrant of right female breast [facility]) Staging form: Breast, AJCC 8th Edition - Clinical stage from 03/26/2018: Stage IA (cT1b, cN0, cM0, G2, ER+, PR+, HER2-) - Signed by Lloyd Huger, MD on 03/26/2018   Lloyd Huger, MD   04/04/2018 8:43 AM

## 2018-03-29 ENCOUNTER — Other Ambulatory Visit: Payer: Self-pay | Admitting: General Surgery

## 2018-03-29 ENCOUNTER — Other Ambulatory Visit: Payer: Self-pay

## 2018-03-29 ENCOUNTER — Encounter
Admission: RE | Admit: 2018-03-29 | Discharge: 2018-03-29 | Disposition: A | Discharge status: Home / Self Care | Payer: PPO | Source: Ambulatory Visit | Attending: General Surgery | Admitting: General Surgery

## 2018-03-29 DIAGNOSIS — Z86718 Personal history of other venous thrombosis and embolism: Secondary | ICD-10-CM | POA: Diagnosis not present

## 2018-03-29 DIAGNOSIS — Z90710 Acquired absence of both cervix and uterus: Secondary | ICD-10-CM | POA: Diagnosis not present

## 2018-03-29 DIAGNOSIS — K219 Gastro-esophageal reflux disease without esophagitis: Secondary | ICD-10-CM | POA: Diagnosis not present

## 2018-03-29 DIAGNOSIS — F329 Major depressive disorder, single episode, unspecified: Secondary | ICD-10-CM | POA: Diagnosis not present

## 2018-03-29 DIAGNOSIS — E669 Obesity, unspecified: Secondary | ICD-10-CM | POA: Diagnosis not present

## 2018-03-29 DIAGNOSIS — I10 Essential (primary) hypertension: Secondary | ICD-10-CM | POA: Diagnosis not present

## 2018-03-29 DIAGNOSIS — E7800 Pure hypercholesterolemia, unspecified: Secondary | ICD-10-CM | POA: Diagnosis not present

## 2018-03-29 DIAGNOSIS — C50911 Malignant neoplasm of unspecified site of right female breast: Secondary | ICD-10-CM | POA: Diagnosis present

## 2018-03-29 DIAGNOSIS — Z6837 Body mass index (BMI) 37.0-37.9, adult: Secondary | ICD-10-CM | POA: Diagnosis not present

## 2018-03-29 DIAGNOSIS — E039 Hypothyroidism, unspecified: Secondary | ICD-10-CM | POA: Diagnosis not present

## 2018-03-29 DIAGNOSIS — R5382 Chronic fatigue, unspecified: Secondary | ICD-10-CM | POA: Diagnosis not present

## 2018-03-29 DIAGNOSIS — J329 Chronic sinusitis, unspecified: Secondary | ICD-10-CM | POA: Diagnosis not present

## 2018-03-29 DIAGNOSIS — C50411 Malignant neoplasm of upper-outer quadrant of right female breast: Secondary | ICD-10-CM

## 2018-03-29 DIAGNOSIS — N904 Leukoplakia of vulva: Secondary | ICD-10-CM | POA: Diagnosis not present

## 2018-03-29 DIAGNOSIS — Z882 Allergy status to sulfonamides status: Secondary | ICD-10-CM | POA: Diagnosis not present

## 2018-03-29 DIAGNOSIS — Z7901 Long term (current) use of anticoagulants: Secondary | ICD-10-CM | POA: Diagnosis not present

## 2018-03-29 DIAGNOSIS — Z170 Estrogen receptor positive status [ER+]: Secondary | ICD-10-CM | POA: Diagnosis not present

## 2018-03-29 DIAGNOSIS — N6011 Diffuse cystic mastopathy of right breast: Secondary | ICD-10-CM | POA: Diagnosis not present

## 2018-03-29 DIAGNOSIS — I340 Nonrheumatic mitral (valve) insufficiency: Secondary | ICD-10-CM | POA: Diagnosis not present

## 2018-03-29 DIAGNOSIS — G8929 Other chronic pain: Secondary | ICD-10-CM | POA: Diagnosis not present

## 2018-03-29 DIAGNOSIS — Z79899 Other long term (current) drug therapy: Secondary | ICD-10-CM | POA: Diagnosis not present

## 2018-03-29 DIAGNOSIS — Z881 Allergy status to other antibiotic agents status: Secondary | ICD-10-CM | POA: Diagnosis not present

## 2018-03-29 DIAGNOSIS — Z87440 Personal history of urinary (tract) infections: Secondary | ICD-10-CM | POA: Diagnosis not present

## 2018-03-29 DIAGNOSIS — Z888 Allergy status to other drugs, medicaments and biological substances status: Secondary | ICD-10-CM | POA: Diagnosis not present

## 2018-03-29 DIAGNOSIS — Z880 Allergy status to penicillin: Secondary | ICD-10-CM | POA: Diagnosis not present

## 2018-03-29 HISTORY — DX: Personal history of urinary calculi: Z87.442

## 2018-03-29 HISTORY — DX: Delusional disorders: F22

## 2018-03-29 HISTORY — DX: Gastro-esophageal reflux disease without esophagitis: K21.9

## 2018-03-29 HISTORY — DX: Hypothyroidism, unspecified: E03.9

## 2018-03-29 NOTE — Patient Instructions (Addendum)
Your procedure is scheduled on: Thursday 04/01/18 REPORT TO RADIOLOGY AT 7:45 AS SCHEDULED Report to [place]. To find out your arrival time please call [phone] between 1PM - 3PM on Wednesday 03/31/18.  Remember: Instructions that are not followed completely may result in serious medical risk, up to and including death, or upon the discretion of your surgeon and anesthesiologist your surgery may need to be rescheduled.     _X__ 1. Do not eat food after midnight the night before your procedure.                 No gum chewing or hard candies. You may drink clear liquids up to 2 hours                 before you are scheduled to arrive for your surgery- DO not drink clear                 liquids within 2 hours of the start of your surgery.                 Clear Liquids include:  water, apple juice without pulp, clear carbohydrate                 drink such as Clearfast or Gatorade, Black Coffee or Tea (Do not add                 anything to coffee or tea).  __X__2.  On the morning of surgery brush your teeth with toothpaste and water, you                 may rinse your mouth with mouthwash if you wish.  Do not swallow any              toothpaste of mouthwash.     _X__ 3.  No Alcohol for 24 hours before or after surgery.   _X__ 4.  Do Not Smoke or use e-cigarettes For 24 Hours Prior to Your Surgery.                 Do not use any chewable tobacco products for at least 6 hours prior to                 surgery.  ____  5.  Bring all medications with you on the day of surgery if instructed.   __X__  6.  Notify your doctor if there is any change in your medical condition      (cold, fever, infections).     Do not wear jewelry, make-up, hairpins, clips or nail polish. Do not wear lotions, powders, or perfumes.  Do not shave 48 hours prior to surgery. Men may shave face and neck. Do not bring valuables to the hospital.    [facility] is  not responsible for any belongings or valuables.  Contacts, dentures/partials or body piercings may not be worn into surgery. Bring a case for your contacts, glasses or hearing aids, a denture cup will be supplied. Leave your suitcase in the car. After surgery it may be brought to your room. For patients admitted to the hospital, discharge time is determined by your treatment team.   Patients discharged the day of surgery will not be allowed to drive home.   Please read over the following fact sheets that you were given:   MRSA Information  __X__ Take these medicines the morning of surgery with  A SIP OF WATER:    1. LEVOTHYROXINE  2. OMEPRAZOLE AT BEDTIME AND AGAIN THE DAY OF PROCEDURE  3.   4.  5.  6.  ____ Fleet Enema (as directed)   __X__ Use CHG Soap/SAGE wipes as directed  ____ Use inhalers on the day of surgery  ____ Stop metformin/Janumet/Farxiga 2 days prior to surgery    ____ Take 1/2 of usual insulin dose the night before surgery. No insulin the morning          of surgery.   __X__ Stop Blood Thinners Coumadin/Plavix/Xarelto/Pleta/Pradaxa/Eliquis/Effient/Aspirin  on AS INSTRUCTED   __X__ Stop Anti-inflammatories 7 days before surgery such as Advil, Ibuprofen, Motrin,  BC or Goodies Powder, Naprosyn, Naproxen, Aleve, Aspirin (MELOXICAM)   __X__ Stop all herbal supplements, fish oil or vitamin E until after surgery.    ____ Bring C-Pap to the hospital.

## 2018-03-30 ENCOUNTER — Inpatient Hospital Stay: Payer: PPO | Attending: Oncology | Admitting: Oncology

## 2018-03-30 ENCOUNTER — Other Ambulatory Visit: Payer: Self-pay

## 2018-03-30 ENCOUNTER — Encounter: Payer: Self-pay | Admitting: Oncology

## 2018-03-30 DIAGNOSIS — Z170 Estrogen receptor positive status [ER+]: Secondary | ICD-10-CM | POA: Diagnosis not present

## 2018-03-30 DIAGNOSIS — C50411 Malignant neoplasm of upper-outer quadrant of right female breast: Secondary | ICD-10-CM | POA: Diagnosis not present

## 2018-03-30 NOTE — Progress Notes (Signed)
Pt and husband in for new patient visit for breast cancer.

## 2018-03-31 ENCOUNTER — Other Ambulatory Visit: Payer: Self-pay | Admitting: *Deleted

## 2018-04-01 ENCOUNTER — Encounter
Admission: RE | Disposition: A | Discharge status: Home / Self Care | Payer: Self-pay | Source: Ambulatory Visit | Attending: General Surgery

## 2018-04-01 ENCOUNTER — Encounter: Payer: Self-pay | Admitting: *Deleted

## 2018-04-01 ENCOUNTER — Ambulatory Visit
Admission: RE | Admit: 2018-04-01 | Discharge: 2018-04-01 | Disposition: A | Discharge status: Home / Self Care | Payer: PPO | Source: Ambulatory Visit | Attending: General Surgery | Admitting: General Surgery

## 2018-04-01 ENCOUNTER — Ambulatory Visit: Payer: PPO | Admitting: Certified Registered"

## 2018-04-01 ENCOUNTER — Other Ambulatory Visit: Payer: Self-pay

## 2018-04-01 DIAGNOSIS — J329 Chronic sinusitis, unspecified: Secondary | ICD-10-CM | POA: Insufficient documentation

## 2018-04-01 DIAGNOSIS — M6530 Trigger finger, unspecified finger: Secondary | ICD-10-CM | POA: Insufficient documentation

## 2018-04-01 DIAGNOSIS — Z87442 Personal history of urinary calculi: Secondary | ICD-10-CM | POA: Insufficient documentation

## 2018-04-01 DIAGNOSIS — I340 Nonrheumatic mitral (valve) insufficiency: Secondary | ICD-10-CM | POA: Insufficient documentation

## 2018-04-01 DIAGNOSIS — F329 Major depressive disorder, single episode, unspecified: Secondary | ICD-10-CM | POA: Insufficient documentation

## 2018-04-01 DIAGNOSIS — N6011 Diffuse cystic mastopathy of right breast: Secondary | ICD-10-CM | POA: Insufficient documentation

## 2018-04-01 DIAGNOSIS — C50411 Malignant neoplasm of upper-outer quadrant of right female breast: Secondary | ICD-10-CM

## 2018-04-01 DIAGNOSIS — E039 Hypothyroidism, unspecified: Secondary | ICD-10-CM | POA: Insufficient documentation

## 2018-04-01 DIAGNOSIS — Z880 Allergy status to penicillin: Secondary | ICD-10-CM | POA: Insufficient documentation

## 2018-04-01 DIAGNOSIS — E7800 Pure hypercholesterolemia, unspecified: Secondary | ICD-10-CM | POA: Insufficient documentation

## 2018-04-01 DIAGNOSIS — R5382 Chronic fatigue, unspecified: Secondary | ICD-10-CM | POA: Insufficient documentation

## 2018-04-01 DIAGNOSIS — M8580 Other specified disorders of bone density and structure, unspecified site: Secondary | ICD-10-CM | POA: Insufficient documentation

## 2018-04-01 DIAGNOSIS — Z87440 Personal history of urinary (tract) infections: Secondary | ICD-10-CM | POA: Insufficient documentation

## 2018-04-01 DIAGNOSIS — N904 Leukoplakia of vulva: Secondary | ICD-10-CM | POA: Insufficient documentation

## 2018-04-01 DIAGNOSIS — M722 Plantar fascial fibromatosis: Secondary | ICD-10-CM | POA: Insufficient documentation

## 2018-04-01 DIAGNOSIS — I10 Essential (primary) hypertension: Secondary | ICD-10-CM | POA: Insufficient documentation

## 2018-04-01 DIAGNOSIS — Z86718 Personal history of other venous thrombosis and embolism: Secondary | ICD-10-CM | POA: Insufficient documentation

## 2018-04-01 DIAGNOSIS — Z888 Allergy status to other drugs, medicaments and biological substances status: Secondary | ICD-10-CM | POA: Insufficient documentation

## 2018-04-01 DIAGNOSIS — Z90710 Acquired absence of both cervix and uterus: Secondary | ICD-10-CM | POA: Insufficient documentation

## 2018-04-01 DIAGNOSIS — M1990 Unspecified osteoarthritis, unspecified site: Secondary | ICD-10-CM | POA: Insufficient documentation

## 2018-04-01 DIAGNOSIS — K219 Gastro-esophageal reflux disease without esophagitis: Secondary | ICD-10-CM | POA: Insufficient documentation

## 2018-04-01 DIAGNOSIS — E669 Obesity, unspecified: Secondary | ICD-10-CM | POA: Insufficient documentation

## 2018-04-01 DIAGNOSIS — Z881 Allergy status to other antibiotic agents status: Secondary | ICD-10-CM | POA: Insufficient documentation

## 2018-04-01 DIAGNOSIS — Z7901 Long term (current) use of anticoagulants: Secondary | ICD-10-CM | POA: Insufficient documentation

## 2018-04-01 DIAGNOSIS — Z79899 Other long term (current) drug therapy: Secondary | ICD-10-CM | POA: Insufficient documentation

## 2018-04-01 DIAGNOSIS — Z882 Allergy status to sulfonamides status: Secondary | ICD-10-CM | POA: Insufficient documentation

## 2018-04-01 DIAGNOSIS — Z170 Estrogen receptor positive status [ER+]: Secondary | ICD-10-CM | POA: Insufficient documentation

## 2018-04-01 DIAGNOSIS — C50911 Malignant neoplasm of unspecified site of right female breast: Secondary | ICD-10-CM | POA: Diagnosis not present

## 2018-04-01 DIAGNOSIS — Z6837 Body mass index (BMI) 37.0-37.9, adult: Secondary | ICD-10-CM | POA: Insufficient documentation

## 2018-04-01 DIAGNOSIS — G8929 Other chronic pain: Secondary | ICD-10-CM | POA: Insufficient documentation

## 2018-04-01 HISTORY — DX: Nausea with vomiting, unspecified: Z98.890

## 2018-04-01 HISTORY — DX: Other complications of anesthesia, initial encounter: T88.59XA

## 2018-04-01 HISTORY — DX: Adverse effect of unspecified anesthetic, initial encounter: T41.45XA

## 2018-04-01 HISTORY — PX: SENTINEL NODE BIOPSY: SHX6608

## 2018-04-01 HISTORY — DX: Nausea with vomiting, unspecified: R11.2

## 2018-04-01 HISTORY — PX: PARTIAL MASTECTOMY WITH NEEDLE LOCALIZATION: SHX6008

## 2018-04-01 SURGERY — PARTIAL MASTECTOMY WITH NEEDLE LOCALIZATION
Anesthesia: General | Site: Breast | Laterality: Right | Wound class: Clean

## 2018-04-01 MED ORDER — ONDANSETRON HCL 4 MG/2ML IJ SOLN
INTRAMUSCULAR | Status: DC | PRN
  Administered 2018-04-01: 4 mg via INTRAVENOUS

## 2018-04-01 MED ORDER — ONDANSETRON HCL 4 MG/2ML IJ SOLN: 4.0000 mg | Freq: Once | INTRAMUSCULAR | Status: DC | PRN

## 2018-04-01 MED ORDER — PHENYLEPHRINE HCL 10 MG/ML IJ SOLN
INTRAMUSCULAR | Status: DC | PRN
  Administered 2018-04-01 (×3): 100 ug via INTRAVENOUS

## 2018-04-01 MED ORDER — DEXAMETHASONE SODIUM PHOSPHATE 10 MG/ML IJ SOLN
INTRAMUSCULAR | Status: DC | PRN
  Administered 2018-04-01: 10 mg via INTRAVENOUS

## 2018-04-01 MED ORDER — FENTANYL CITRATE (PF) 100 MCG/2ML IJ SOLN
INTRAMUSCULAR | Status: AC
  Filled 2018-04-01: qty 2

## 2018-04-01 MED ORDER — SUCCINYLCHOLINE CHLORIDE 20 MG/ML IJ SOLN
INTRAMUSCULAR | Status: DC | PRN
  Administered 2018-04-01: 100 mg via INTRAVENOUS

## 2018-04-01 MED ORDER — VANCOMYCIN HCL IN DEXTROSE 1-5 GM/200ML-% IV SOLN
INTRAVENOUS | Status: AC
  Filled 2018-04-01: qty 200

## 2018-04-01 MED ORDER — LIDOCAINE HCL (CARDIAC) PF 100 MG/5ML IV SOSY
PREFILLED_SYRINGE | INTRAVENOUS | Status: DC | PRN
  Administered 2018-04-01: 100 mg via INTRAVENOUS

## 2018-04-01 MED ORDER — PROPOFOL 500 MG/50ML IV EMUL
INTRAVENOUS | Status: DC | PRN
  Administered 2018-04-01: 120 ug/kg/min via INTRAVENOUS

## 2018-04-01 MED ORDER — BUPIVACAINE-EPINEPHRINE (PF) 0.25% -1:200000 IJ SOLN
INTRAMUSCULAR | Status: AC
  Filled 2018-04-01: qty 30

## 2018-04-01 MED ORDER — FENTANYL CITRATE (PF) 100 MCG/2ML IJ SOLN
INTRAMUSCULAR | Status: AC
  Administered 2018-04-01: 50 ug via INTRAVENOUS
  Filled 2018-04-01: qty 2

## 2018-04-01 MED ORDER — SCOPOLAMINE 1 MG/3DAYS TD PT72
MEDICATED_PATCH | TRANSDERMAL | Status: AC
  Filled 2018-04-01: qty 1

## 2018-04-01 MED ORDER — HYDROCODONE-ACETAMINOPHEN 5-325 MG PO TABS: 1.0000 | ORAL_TABLET | ORAL | 0 refills | Status: AC | PRN

## 2018-04-01 MED ORDER — FENTANYL CITRATE (PF) 100 MCG/2ML IJ SOLN
INTRAMUSCULAR | Status: DC | PRN
  Administered 2018-04-01: 100 ug via INTRAVENOUS

## 2018-04-01 MED ORDER — MIDAZOLAM HCL 2 MG/2ML IJ SOLN
INTRAMUSCULAR | Status: DC | PRN
  Administered 2018-04-01: 2 mg via INTRAVENOUS

## 2018-04-01 MED ORDER — TECHNETIUM TC 99M SULFUR COLLOID FILTERED
0.7760 | Freq: Once | INTRAVENOUS | Status: AC | PRN
  Administered 2018-04-01: 0.776 via INTRADERMAL

## 2018-04-01 MED ORDER — PROPOFOL 500 MG/50ML IV EMUL
INTRAVENOUS | Status: AC
  Filled 2018-04-01: qty 50

## 2018-04-01 MED ORDER — LACTATED RINGERS IV SOLN
INTRAVENOUS | Status: DC
  Administered 2018-04-01 (×2): via INTRAVENOUS

## 2018-04-01 MED ORDER — GLYCOPYRROLATE 0.2 MG/ML IJ SOLN
INTRAMUSCULAR | Status: DC | PRN
  Administered 2018-04-01: 0.2 mg via INTRAVENOUS

## 2018-04-01 MED ORDER — PROPOFOL 10 MG/ML IV BOLUS
INTRAVENOUS | Status: DC | PRN
  Administered 2018-04-01: 100 mg via INTRAVENOUS

## 2018-04-01 MED ORDER — BUPIVACAINE-EPINEPHRINE (PF) 0.25% -1:200000 IJ SOLN
INTRAMUSCULAR | Status: DC | PRN
  Administered 2018-04-01: 20 mL via PERINEURAL

## 2018-04-01 MED ORDER — SCOPOLAMINE 1 MG/3DAYS TD PT72
1.0000 | MEDICATED_PATCH | TRANSDERMAL | Status: DC
  Administered 2018-04-01: 1.5 mg via TRANSDERMAL

## 2018-04-01 MED ORDER — HYDROCODONE-ACETAMINOPHEN 5-325 MG PO TABS
1.0000 | ORAL_TABLET | Freq: Once | ORAL | Status: AC
  Administered 2018-04-01: 1 via ORAL

## 2018-04-01 MED ORDER — VANCOMYCIN HCL IN DEXTROSE 1-5 GM/200ML-% IV SOLN
1000.0000 mg | INTRAVENOUS | Status: AC
  Administered 2018-04-01: 1000 mg via INTRAVENOUS

## 2018-04-01 MED ORDER — DEXMEDETOMIDINE HCL 200 MCG/2ML IV SOLN
INTRAVENOUS | Status: DC | PRN
  Administered 2018-04-01: 4 ug via INTRAVENOUS
  Administered 2018-04-01: 8 ug via INTRAVENOUS

## 2018-04-01 MED ORDER — MIDAZOLAM HCL 2 MG/2ML IJ SOLN
INTRAMUSCULAR | Status: AC
  Filled 2018-04-01: qty 2

## 2018-04-01 MED ORDER — FENTANYL CITRATE (PF) 100 MCG/2ML IJ SOLN
25.0000 ug | INTRAMUSCULAR | Status: DC | PRN
  Administered 2018-04-01 (×3): 50 ug via INTRAVENOUS

## 2018-04-01 MED ORDER — HYDROCODONE-ACETAMINOPHEN 5-325 MG PO TABS
ORAL_TABLET | ORAL | Status: AC
  Filled 2018-04-01: qty 1

## 2018-04-01 SURGICAL SUPPLY — 46 items
ADH SKN CLS APL DERMABOND .7 (GAUZE/BANDAGES/DRESSINGS) ×1
CANISTER SUCT 1200ML W/VALVE (MISCELLANEOUS) ×3 IMPLANT
CHLORAPREP W/TINT 26ML (MISCELLANEOUS) ×3 IMPLANT
CNTNR SPEC 2.5X3XGRAD LEK (MISCELLANEOUS) ×2
CONT SPEC 4OZ STER OR WHT (MISCELLANEOUS) ×4
CONT SPEC 4OZ STRL OR WHT (MISCELLANEOUS) ×2
CONTAINER SPEC 2.5X3XGRAD LEK (MISCELLANEOUS) ×2 IMPLANT
DERMABOND ADVANCED (GAUZE/BANDAGES/DRESSINGS) ×2
DERMABOND ADVANCED .7 DNX12 (GAUZE/BANDAGES/DRESSINGS) ×1 IMPLANT
DEVICE DUBIN SPECIMEN MAMMOGRA (MISCELLANEOUS) ×3 IMPLANT
DRAPE LAPAROTOMY 77X122 PED (DRAPES) ×3 IMPLANT
DRAPE LAPAROTOMY TRNSV 106X77 (MISCELLANEOUS) ×3 IMPLANT
ELECT REM PT RETURN 9FT ADLT (ELECTROSURGICAL) ×3
ELECTRODE REM PT RTRN 9FT ADLT (ELECTROSURGICAL) ×1 IMPLANT
GLOVE BIO SURGEON STRL SZ 6.5 (GLOVE) ×2 IMPLANT
GLOVE BIO SURGEON STRL SZ8.5 (GLOVE) ×2 IMPLANT
GLOVE BIO SURGEONS STRL SZ 6.5 (GLOVE) ×1
GLOVE BIOGEL M 6.5 STRL (GLOVE) ×3 IMPLANT
GLOVE BIOGEL PI IND STRL 7.0 (GLOVE) IMPLANT
GLOVE BIOGEL PI INDICATOR 7.0 (GLOVE) ×2
GOWN STRL REUS W/ TWL LRG LVL3 (GOWN DISPOSABLE) ×2 IMPLANT
GOWN STRL REUS W/TWL LRG LVL3 (GOWN DISPOSABLE) ×9
KIT TURNOVER KIT A (KITS) ×3 IMPLANT
LABEL OR SOLS (LABEL) ×3 IMPLANT
MARGIN MAP 10MM (MISCELLANEOUS) ×3 IMPLANT
NDL HYPO 25X1 1.5 SAFETY (NEEDLE) ×1 IMPLANT
NDL SAFETY ECLIPSE 18X1.5 (NEEDLE) ×1 IMPLANT
NEEDLE HYPO 18GX1.5 SHARP (NEEDLE) ×3
NEEDLE HYPO 22GX1.5 SAFETY (NEEDLE) ×3 IMPLANT
NEEDLE HYPO 25X1 1.5 SAFETY (NEEDLE) ×3 IMPLANT
PACK BASIN MINOR ARMC (MISCELLANEOUS) ×3 IMPLANT
RETRACTOR WOUND ALXS 18CM SML (MISCELLANEOUS) ×1 IMPLANT
RTRCTR WOUND ALEXIS O 18CM SML (MISCELLANEOUS) ×3
SLEVE PROBE SENORX GAMMA FIND (MISCELLANEOUS) ×3 IMPLANT
SUT ETHILON 3-0 FS-10 30 BLK (SUTURE) ×3
SUT MNCRL 4-0 (SUTURE) ×3
SUT MNCRL 4-0 27XMFL (SUTURE) ×1
SUT PROLENE 2 0 FS (SUTURE) ×2 IMPLANT
SUT SILK 2 0 SH (SUTURE) IMPLANT
SUT VIC AB 0 CT1 36 (SUTURE) ×2 IMPLANT
SUT VIC AB 3-0 SH 27 (SUTURE) ×6
SUT VIC AB 3-0 SH 27X BRD (SUTURE) ×1 IMPLANT
SUTURE EHLN 3-0 FS-10 30 BLK (SUTURE) ×1 IMPLANT
SUTURE MNCRL 4-0 27XMF (SUTURE) ×1 IMPLANT
SYR 10ML LL (SYRINGE) ×3 IMPLANT
WATER STERILE IRR 1000ML POUR (IV SOLUTION) ×3 IMPLANT

## 2018-04-01 NOTE — Discharge Instructions (Signed)
°  Diet: Resume home heart healthy regular diet.   Activity: No heavy lifting >20 pounds (children, pets, laundry, garbage) or strenuous activity until follow-up, but light activity and walking are encouraged. Do not drive or drink alcohol if taking narcotic pain medications.  Wound care: May shower with soapy water and pat dry (do not rub incisions), but no baths or submerging incision underwater until follow-up. (no swimming)   Medications: Resume all home medications except Xarelto. Restart Xarelto on Saturday 04/03/18 and take it as prescribed. For mild to moderate pain: acetaminophen (Tylenol) or ibuprofen (if no kidney disease). Combining Tylenol with alcohol can substantially increase your risk of causing liver disease. Narcotic pain medications, if prescribed, can be used for severe pain, though may cause nausea, constipation, and drowsiness. Do not combine Tylenol and Norco within a 6 hour period as Norco contains Tylenol. If you do not need the narcotic pain medication, you do not need to fill the prescription.  Call office [phone]) at any time if any questions, worsening pain, fevers/chills, bleeding, drainage from incision site, or other concerns.  AMBULATORY SURGERY  DISCHARGE INSTRUCTIONS   1) The drugs that you were given will stay in your system until tomorrow so for the next 24 hours you should not:  A) Drive an automobile B) Make any legal decisions C) Drink any alcoholic beverage   2) You may resume regular meals tomorrow.  Today it is better to start with liquids and gradually work up to solid foods.  You may eat anything you prefer, but it is better to start with liquids, then soup and crackers, and gradually work up to solid foods.   3) Please notify your doctor immediately if you have any unusual bleeding, trouble breathing, redness and pain at the surgery site, drainage, fever, or pain not relieved by medication.    4) Additional  Instructions:        Please contact your physician with any problems or Same Day Surgery at [phone], Monday through Friday 6 am to 4 pm, or [place] at [facility] number at [phone].

## 2018-04-01 NOTE — Brief Op Note (Signed)
04/01/2018  1:38 PM  PATIENT:  Susan Myers  67 y.o. female  PRE-OPERATIVE DIAGNOSIS:  MALIGNANT NEOPLASM OF UPPER OUTER OF RIGHT BREAST ESTROGEN RECEPTOR UNKNOWN  POST-OPERATIVE DIAGNOSIS:  MALIGNANT NEOPLASM OF UPPER OUTER OF RIGHT   PROCEDURE:  Procedure(s): PARTIAL MASTECTOMY WITH NEEDLE LOCALIZATION (Right) SENTINEL NODE BIOPSY (Right)  SURGEON:  Surgeon(s) and Role:    * Herbert Pun, MD - Primary  ANESTHESIA:   local and general  EBL:  10 mL

## 2018-04-01 NOTE — Anesthesia Post-op Follow-up Note (Signed)
Anesthesia QCDR form completed.        

## 2018-04-01 NOTE — Anesthesia Procedure Notes (Signed)
Procedure Name: Intubation Date/Time: 04/01/2018 11:24 AM Performed by: Patience Musca., CRNA Pre-anesthesia Checklist: Patient identified, Patient being monitored, Timeout performed, Emergency Drugs available and Suction available Patient Re-evaluated:Patient Re-evaluated prior to induction Oxygen Delivery Method: Circle system utilized Preoxygenation: Pre-oxygenation with 100% oxygen Induction Type: IV induction Laryngoscope Size: 3 and McGraph Grade View: Grade I Tube type: Oral Tube size: 7.0 mm Number of attempts: 1 Airway Equipment and Method: Stylet Placement Confirmation: ETT inserted through vocal cords under direct vision,  positive ETCO2 and breath sounds checked- equal and bilateral Secured at: 21 cm Tube secured with: Tape Dental Injury: Teeth and Oropharynx as per pre-operative assessment

## 2018-04-01 NOTE — Op Note (Addendum)
Preoperative diagnosis: Right breast carcinoma.  Postoperative diagnosis: Right breast carcinoma..   Procedure: Right needle-localized breast lumpectomy.                      Right Axillary Sentinel Lymph node biopsy  Anesthesia: GETA  Surgeon: Dr. Windell Moment  Wound Classification: Clean  Indications: Patient is a 67 y.o. female with a nonpalpable right breast mass noted on mammography with core biopsy demonstrating invasive mammary carcinoma that requires needle-localized lumpectomy for treatment with sentinel lymph node biopsy for staging.   Findings: 1. Specimen mammography shows marker and wire on specimen 2. Pathology call refers gross examination of margins was close to the inferior margin <89mm. Re excision of inferior margin was done and sent to pathology.  3. No other palpable mass or lymph node identified.   Description of procedure: Preoperative needle localization was performed by radiology. In the nuclear medicine suite, the subareolar region was injected with Tc-99 sulfur colloid. Localization studies were reviewed. The patient was taken to the operating room and placed supine on the operating table, and after general anesthesia the right chest and axilla were prepped and draped in the usual sterile fashion. A time-out was completed verifying correct patient, procedure, site, positioning, and implant(s) and/or special equipment prior to beginning this procedure.  By comparing the localization studies with the direction and skin entry site of the needle, the probable trajectory and location of the mass was visualized. A circumareolar skin incision was planned in such a way as to minimize the amount of dissection to reach the mass.  The skin incision was made. Flaps were raised and the location of the wire confirmed. The wire was delivered into the wound. A 2-0 silk figure-of-eight stay suture was placed around the wire and used for retraction. Dissection was then taken down  circumferentially, taking care to include the entire localizing needle and a wide margin of grossly normal tissue. The specimen and entire localizing wire were removed. The specimen was oriented and sent to radiology with the localization studies. Confirmation was received that the entire target lesion had been resected. Then specimen was sent to pathology for evaluation of gross margins. Pathology called back referring that the inferior margin was < 1 mm. Re excision of the inferior margin was done. The wound was irrigated. Hemostasis was checked.  A hand-held gamma probe was used to identify the location of the hottest spot in the axilla. Prior to the incision, the counts were 159. An incision was made around the caudal axillary hairline. Dissection was carried down until subdermal facias was advanced. The probe was placed and again, the point of maximal count was found. Dissection continue until nodule was identified. The node was excised in its entirety. Ex vivo, the node measured 4156 counts when placed on the probe. The bed of the node measured 54 counts. No additional hot spots were identified. No clinically abnormal nodes were palpated. The procedure was terminated. Hemostasis was achieved and the wounds were closed in layers with deep interrupted 3-0 Vicryl and skin was closed with subcuticular suture of Monocryl 3-0. No attempt was made to close the dead space on the lumpectomy site. Dermabond was applied.  The patient tolerated the procedure well and was taken to the postanesthesia care unit in stable condition.   Specimen: Right Breast mass (Orientation markers used: Cranial, Caudal, Medial, Lateral,Skin, Deep)                  Right axillary  Sentinel Lymph nodes  Complications: None  Estimated Blood Loss: 10 mL  ADDENDUM: Upon pathology review that reports that no lymph node was submitted, I started an investigation to see what happened with the sentinel lymph node that was resected. Upon  discussion and investigation with Audrie Lia, she found out that the sentinel lymph node biopsy was discarded accidentally and lost by the OR staff. Patient safety report will be filed with documentation of the incident.

## 2018-04-01 NOTE — Anesthesia Preprocedure Evaluation (Signed)
Anesthesia Evaluation  Patient identified by MRN, date of birth, ID band Patient awake    Reviewed: Allergy & Precautions, H&P , NPO status , Patient's Chart, lab work & pertinent test results, reviewed documented beta blocker date and time   History of Anesthesia Complications (+) PONV and history of anesthetic complications  Airway Mallampati: III  TM Distance: >3 FB Neck ROM: full  Mouth opening: Limited Mouth Opening  Dental  (+) Caps, Dental Advidsory Given, Teeth Intact   Pulmonary neg pulmonary ROS,           Cardiovascular Exercise Tolerance: Good negative cardio ROS       Neuro/Psych PSYCHIATRIC DISORDERS negative neurological ROS     GI/Hepatic Neg liver ROS, GERD  ,  Endo/Other  neg diabetesHypothyroidism   Renal/GU Renal disease (kidney stones)  negative genitourinary   Musculoskeletal   Abdominal   Peds  Hematology negative hematology ROS (+)   Anesthesia Other Findings Past Medical History: No date: Complication of anesthesia No date: Delusional disorder ([place]) 06/2016: DVT (deep venous thrombosis) ([facility]) No date: GERD (gastroesophageal reflux disease) No date: History of kidney stones No date: Hx of blood clots No date: Hypercholesterolemia No date: Hypothyroidism No date: Kidney stones No date: PONV (postoperative nausea and vomiting) No date: Thyroid disease   Reproductive/Obstetrics negative OB ROS                             Anesthesia Physical Anesthesia Plan  ASA: II  Anesthesia Plan: General   Post-op Pain Management:    Induction: Intravenous  PONV Risk Score and Plan: 4 or greater and Ondansetron, Dexamethasone and TIVA  Airway Management Planned: LMA  Additional Equipment:   Intra-op Plan:   Post-operative Plan: Extubation in OR  Informed Consent: I have reviewed the patients History and Physical, chart, labs and discussed the procedure  including the risks, benefits and alternatives for the proposed anesthesia with the patient or authorized representative who has indicated his/her understanding and acceptance.   Dental Advisory Given  Plan Discussed with: Anesthesiologist, CRNA and Surgeon  Anesthesia Plan Comments:         Anesthesia Quick Evaluation

## 2018-04-01 NOTE — Interval H&P Note (Signed)
History and Physical Interval Note:  04/01/2018 10:57 AM  Susan Myers  has presented today for surgery, with the diagnosis of MALIGNANT NEOPLASM OF UPPER OUTER OF RIGHT BREAST ESTROGEN RECEPTOR UNKNOWN  The various methods of treatment have been discussed with the patient and family. After consideration of risks, benefits and other options for treatment, the patient has consented to  Procedure(s): PARTIAL MASTECTOMY WITH NEEDLE LOCALIZATION (Right) SENTINEL NODE BIOPSY (Right) as a surgical intervention .  The patient's history has been reviewed, patient examined, no change in status, stable for surgery.  I have reviewed the patient's chart and labs.  Right chest marked in the pre procedure room.  Questions were answered to the patient's satisfaction.     Herbert Pun

## 2018-04-03 ENCOUNTER — Encounter: Payer: Self-pay | Admitting: General Surgery

## 2018-04-04 NOTE — Anesthesia Postprocedure Evaluation (Deleted)
Anesthesia Post Note  Patient: Susan Myers  Procedure(s) Performed: PARTIAL MASTECTOMY WITH NEEDLE LOCALIZATION (Right Breast) SENTINEL NODE BIOPSY (Right Breast)  Patient location during evaluation: PACU Anesthesia Type: General Level of consciousness: awake and alert Pain management: pain level controlled Vital Signs Assessment: post-procedure vital signs reviewed and stable Respiratory status: spontaneous breathing, nonlabored ventilation, respiratory function stable and patient connected to nasal cannula oxygen Cardiovascular status: blood pressure returned to baseline and stable Postop Assessment: no apparent nausea or vomiting Anesthetic complications: no     Last Vitals:  Vitals:   04/01/18 1420 04/01/18 1429  BP: 124/87 125/79  Pulse: 94 83  Resp: 14 15  Temp:  36.5 C  SpO2: 92% 94%    Last Pain:  Vitals:   04/02/18 0836  TempSrc:   PainSc: 2                  Martha Clan

## 2018-04-05 ENCOUNTER — Encounter: Payer: Self-pay | Admitting: General Surgery

## 2018-04-05 LAB — SURGICAL PATHOLOGY

## 2018-04-05 NOTE — Transfer of Care (Signed)
Immediate Anesthesia Transfer of Care Note  Patient: Susan Myers  Procedure(s) Performed: PARTIAL MASTECTOMY WITH NEEDLE LOCALIZATION (Right Breast) SENTINEL NODE BIOPSY (Right Breast)  Patient Location: PACU  Anesthesia Type:General  Level of Consciousness: awake, alert  and oriented  Airway & Oxygen Therapy: Patient Spontanous Breathing  Post-op Assessment: Report given to RN  Post vital signs: Reviewed and stable  Last Vitals:  Vitals Value Taken Time  BP    Temp    Pulse    Resp    SpO2      Last Pain:  Vitals:   04/02/18 0836  TempSrc:   PainSc: 2          Complications: No apparent anesthesia complications

## 2018-04-05 NOTE — Anesthesia Postprocedure Evaluation (Signed)
Anesthesia Post Note  Patient: Susan Myers  Procedure(s) Performed: PARTIAL MASTECTOMY WITH NEEDLE LOCALIZATION (Right Breast) SENTINEL NODE BIOPSY (Right Breast)  Patient location during evaluation: PACU Anesthesia Type: General Level of consciousness: awake and alert Pain management: pain level controlled Vital Signs Assessment: post-procedure vital signs reviewed and stable Respiratory status: spontaneous breathing, nonlabored ventilation, respiratory function stable and patient connected to nasal cannula oxygen Cardiovascular status: blood pressure returned to baseline and stable Postop Assessment: no apparent nausea or vomiting Anesthetic complications: no     Last Vitals:  Vitals:   04/01/18 1420 04/01/18 1429  BP: 124/87 125/79  Pulse: 94 83  Resp: 14 15  Temp:  36.5 C  SpO2: 92% 94%    Last Pain:  Vitals:   04/02/18 0836  TempSrc:   PainSc: 2                  Molli Barrows

## 2018-04-08 DIAGNOSIS — E7800 Pure hypercholesterolemia, unspecified: Secondary | ICD-10-CM | POA: Diagnosis not present

## 2018-04-08 DIAGNOSIS — I10 Essential (primary) hypertension: Secondary | ICD-10-CM | POA: Diagnosis not present

## 2018-04-08 DIAGNOSIS — E039 Hypothyroidism, unspecified: Secondary | ICD-10-CM | POA: Diagnosis not present

## 2018-04-08 DIAGNOSIS — M797 Fibromyalgia: Secondary | ICD-10-CM | POA: Diagnosis not present

## 2018-04-11 NOTE — Progress Notes (Signed)
[place]  Telephone:(336) [phone] Fax:(336) [phone]  ID: [name] OB: 1951/09/27  MR#: [id number]  TDV#:[id number]  Patient Care Team: Idelle Crouch, MD as PCP - General (Internal Medicine)  CHIEF COMPLAINT: Clinical stage IA ER/PR positive, HER-2 negative invasive carcinoma of the upper outer quadrant of the right breast.  INTERVAL HISTORY: Patient returns to clinic today for further evaluation and discussion of her final pathology results.  She recent had her lumpectomy and tolerated the procedure well.  She has some mild right breast tenderness.  She continues to be anxious.  She has no neurologic complaints.  She denies any recent fevers or illnesses.  She has a good appetite and denies weight loss.  She has no chest pain or shortness of breath.  She denies any nausea, vomiting, constipation, or diarrhea.  She has no urinary complaints.  Patient offers no further specific complaints today.  REVIEW OF SYSTEMS:   Review of Systems  Constitutional: Negative.  Negative for fever, malaise/fatigue and weight loss.  Respiratory: Negative.  Negative for cough and shortness of breath.   Cardiovascular: Negative.  Negative for chest pain and leg swelling.  Gastrointestinal: Negative.  Negative for abdominal pain.  Genitourinary: Negative.  Negative for dysuria.  Musculoskeletal: Negative.  Negative for back pain.  Skin: Negative.  Negative for rash.  Neurological: Negative.  Negative for sensory change, focal weakness and weakness.  Psychiatric/Behavioral: The patient is nervous/anxious.     As per HPI. Otherwise, a complete review of systems is negative.  PAST MEDICAL HISTORY: Past Medical History:  Diagnosis Date  . Complication of anesthesia   . Delusional disorder ([place])   . DVT (deep venous thrombosis) ([place]) 06/2016  . GERD (gastroesophageal reflux disease)   . History of kidney stones   . Hx of blood clots   . Hypercholesterolemia   .  Hypothyroidism   . Kidney stones   . PONV (postoperative nausea and vomiting)   . Thyroid disease     PAST SURGICAL HISTORY: Past Surgical History:  Procedure Laterality Date  . ABDOMINAL HYSTERECTOMY    . APPENDECTOMY    . BREAST BIOPSY Right 03/23/2018   Affirm Bx- path pending- X clip  . BREAST CYST ASPIRATION Bilateral 1990  . COLONOSCOPY    . IVC FILTER INSERTION    . PARTIAL MASTECTOMY WITH NEEDLE LOCALIZATION Right 04/01/2018   Procedure: PARTIAL MASTECTOMY WITH NEEDLE LOCALIZATION;  Surgeon: Herbert Pun, MD;  Location: [facility];  Service: General;  Laterality: Right;  . PERIPHERAL VASCULAR CATHETERIZATION N/A 08/06/2016   Procedure: IVC Filter Insertion;  Surgeon: Katha Cabal, MD;  Location: [place] [facility];  Service: Cardiovascular;  Laterality: N/A;  . PERIPHERAL VASCULAR CATHETERIZATION Left 08/06/2016   Procedure: Lower Extremity Venography;  Surgeon: Katha Cabal, MD;  Location: [place] [facility];  Service: Cardiovascular;  Laterality: Left;  . PERIPHERAL VASCULAR CATHETERIZATION  08/06/2016   Procedure: Lower Extremity Intervention;  Surgeon: Katha Cabal, MD;  Location: [place] [facility];  Service: Cardiovascular;;  . PERIPHERAL VASCULAR CATHETERIZATION N/A 12/16/2016   Procedure: IVC Filter Removal;  Surgeon: Katha Cabal, MD;  Location: [place] [facility];  Service: Cardiovascular;  Laterality: N/A;  . SENTINEL NODE BIOPSY Right 04/01/2018   Procedure: SENTINEL NODE BIOPSY;  Surgeon: Herbert Pun, MD;  Location: [facility];  Service: General;  Laterality: Right;  . TONSILLECTOMY      FAMILY HISTORY: Family History  Problem Relation Age of Onset  . Breast cancer  Neg Hx   . Prostate cancer Neg Hx   . Kidney cancer Neg Hx     ADVANCED DIRECTIVES (Y/N):  N  HEALTH MAINTENANCE: Social History   Tobacco Use  . Smoking status: Never Smoker  . Smokeless tobacco: Never Used  Substance Use Topics  . Alcohol use: No  .  Drug use: No     Colonoscopy:  PAP:  Bone density:  Lipid panel:  Allergies  Allergen Reactions  . Levaquin [Levofloxacin In D5w] Hives  . Macrolides And Ketolides     WHELPS  . Penicillins Itching    Can take amoxicillin  Has patient had a PCN reaction causing immediate rash, facial/tongue/throat swelling, SOB or lightheadedness with hypotension:No Has patient had a PCN reaction causing severe rash involving mucus membranes or skin necrosis:unsure Has patient had a PCN reaction that required hospitalization:No Has patient had a PCN reaction occurring within the last 10 years: No If all of the above answers are "NO", then may proceed with Cephalosporin use.  . Sulfa Antibiotics Hives    Current Outpatient Medications  Medication Sig Dispense Refill  . acetaminophen (TYLENOL) 500 MG tablet Take 500 mg by mouth every 6 (six) hours as needed for moderate pain or headache.    . Cholecalciferol (VITAMIN D PO) Take 1 tablet by mouth once a week.    . diphenhydrAMINE (BENADRYL) 25 MG tablet Take 25 mg by mouth at bedtime as needed for sleep.    . hydrochlorothiazide (HYDRODIURIL) 25 MG tablet Take 25 mg by mouth daily.    Marland Kitchen levothyroxine (SYNTHROID, LEVOTHROID) 100 MCG tablet Take 1 tablet (100 mcg total) by mouth daily before breakfast. 30 tablet 0  . meloxicam (MOBIC) 7.5 MG tablet Take 7.5 mg by mouth daily as needed for pain.    Marland Kitchen omeprazole (PRILOSEC) 20 MG capsule Take 20 mg by mouth daily as needed (acid reflux).     . phenylephrine (SUDAFED PE) 10 MG TABS tablet Take 10 mg by mouth every 6 (six) hours as needed (congestion).    . risperiDONE (RISPERDAL) 1 MG tablet Take 1 mg by mouth at bedtime.    . rivaroxaban (XARELTO) 20 MG TABS tablet Take 20 mg by mouth daily.    . simvastatin (ZOCOR) 20 MG tablet Take 20 mg by mouth every evening.      No current facility-administered medications for this visit.     OBJECTIVE: Vitals:   04/15/18 1010 04/15/18 1015  BP:  125/84    Pulse:  78  Resp: 12   Temp:  97.9 F (36.6 C)     Body mass index is 37.85 kg/m.    ECOG FS:0 - Asymptomatic  General: Well-developed, well-nourished, no acute distress. Eyes: Pink conjunctiva, anicteric sclera. Breast: Well-healed surgical scar in right breast. Lungs: Clear to auscultation bilaterally. Heart: Regular rate and rhythm. No rubs, murmurs, or gallops. Abdomen: Soft, nontender, nondistended. No organomegaly noted, normoactive bowel sounds. Musculoskeletal: No edema, cyanosis, or clubbing. Neuro: Alert, answering all questions appropriately. Cranial nerves grossly intact. Skin: No rashes or petechiae noted. Psych: Normal affect.   LAB RESULTS:  Lab Results  Component Value Date   NA 143 11/23/2015   K 3.4 (L) 11/23/2015   CL 105 11/23/2015   CO2 28 11/23/2015   GLUCOSE 101 (H) 11/23/2015   BUN 23 (H) 08/06/2016   CREATININE 0.88 08/06/2016   CALCIUM 9.7 11/23/2015   PROT 7.5 11/23/2015   ALBUMIN 4.8 11/23/2015   AST 22 11/23/2015   ALT 24  11/23/2015   ALKPHOS 88 11/23/2015   BILITOT 0.8 11/23/2015   GFRNONAA >60 08/06/2016   GFRAA >60 08/06/2016    Lab Results  Component Value Date   WBC 10.5 08/06/2016   HGB 14.2 08/06/2016   HCT 43.0 08/06/2016   MCV 92.8 08/06/2016   PLT 340 08/06/2016     STUDIES: Nm Sentinel Node Injection  Result Date: 04/01/2018 CLINICAL DATA:  Right breast cancer. EXAM: NUCLEAR MEDICINE BREAST LYMPHOSCINTIGRAPHY TECHNIQUE: Intradermal injection of radiopharmaceutical was performed at the 12 o'clock, 3 o'clock, 6 o'clock, and 9 o'clock positions around the right nipple. The patient was then sent to the operating room where the sentinel node(s) were identified and removed by the surgeon. RADIOPHARMACEUTICALS:  Total of 0.776 mCi Millipore-filtered Technetium-36msulfur colloid, injected in four aliquots. IMPRESSION: Uncomplicated intradermal injection of Technetium-938mulfur colloid for purposes of sentinel node  identification. Electronically Signed   By: AdMarkus Daft.D.   On: 04/01/2018 09:25   Mm Breast Surgical Specimen  Result Date: 04/01/2018 CLINICAL DATA:  Right lumpectomy for recently diagnosed invasive mammary carcinoma in the upper-outer quadrant of the breast. EXAM: SPECIMEN RADIOGRAPH OF THE RIGHT BREAST COMPARISON:  Previous exam(s). FINDINGS: Status post excision of the right breast. The wire tip and X shaped biopsy marker clip are present. IMPRESSION: Specimen radiograph of the right breast. Electronically Signed   By: StClaudie Revering.D.   On: 04/01/2018 12:25   Mm Clip Placement Right  Result Date: 03/23/2018 CLINICAL DATA:  Confirmation of clip placement after stereotactic tomosynthesis core needle biopsy architectural distortion involving the UPPER OUTER QUADRANT of the RIGHT breast at MIDDLE depth. EXAM: DIAGNOSTIC RIGHT MAMMOGRAM POST STEREOTACTIC BIOPSY COMPARISON:  Previous exam(s). FINDINGS: Mammographic images were obtained following stereotactic tomosynthesis guided biopsy of architectural distortion involving the UPPER OUTER QUADRANT of the RIGHT breast at MIDDLE depth. The X shaped tissue marker clip is positioned at the SUPERIOR and POSTERIOR margin of the biopsied architectural distortion, approximately 10 mm from the center of the distortion. Expected post biopsy changes are present without evidence of hematoma. IMPRESSION: Appropriate positioning of the X shaped tissue marker clip at the SUPERIOR and POSTERIOR margin of the biopsied architectural distortion in the UPPER OUTER QUADRANT of the RIGHT breast. Final Assessment: Post Procedure Mammograms for Marker Placement Electronically Signed   By: ThEvangeline Dakin.D.   On: 03/23/2018 08:35   Mm Rt Breast Bx W Loc Dev 1st Lesion Image Bx Spec Stereo Guide  Addendum Date: 03/25/2018   ADDENDUM REPORT: 03/25/2018 16:22 ADDENDUM: Pathology of the right breast biopsy revealed A. RIGHT BREAST, UPPER OUTER QUADRANT, MIDDLE DEPTH;  STEREOTACTIC BIOPSY: INVASIVE MAMMARY CARCINOMA, NO SPECIAL TYPE. Size of invasive carcinoma: 6 mm in this sample. Histologic grade of invasive carcinoma: Grade 2. Ductal carcinoma in situ: Not identified. Lymphovascular invasion: Not identified. ER/PR/HER2: Immunohistochemistry will be performed on block A2, with reflex to FI[place] HER2 2+. The results will be reported in an addendum. Comment: The definitive grade will be assigned on the excisional specimen. [facility] at 11 AM. Read-back procedure was performed. This was found to be concordant with Dr. LaElna Breslowmpression and notes. Recommendation: Surgical and oncology referrals. At the patient's request, results and recommendations were discussed with the patient and her husband by phone by Dr. DiJetta Loutn 03/24/18. The patient stated she did well following the biopsy. All of their questions were answered. They were informed that the nurse navigator for  [facility] will contact them and arrange referrals. Request for referral was relayed to Al Pimple, RN, nurse navigator by Jetta Lout, RRA. Appointments were made with Dr. Peyton Najjar, surgeon for 03/25/18 at 11:00 AM and Dr. Grayland Ormond, oncologist for 03/30/18 at 3:00 PM by Al Pimple, RN. The patient was notified of the appointment information by Al Pimple, RN. Addendum by Jetta Lout, RRA on 03/25/18. Electronically Signed   By: Fidela Salisbury M.D.   On: 03/25/2018 16:22   Result Date: 03/25/2018 CLINICAL DATA:  67 year old with screening detected architectural distortion involving the UPPER OUTER QUADRANT of the RIGHT breast at MIDDLE depth, without ultrasound correlate. EXAM: RIGHT BREAST STEREOTACTIC CORE NEEDLE BIOPSY COMPARISON:  Previous exams. FINDINGS: The patient and I discussed the procedure of stereotactic-guided biopsy including benefits and alternatives. We discussed the high likelihood of a successful  procedure. We discussed the risks of the procedure including infection, bleeding, tissue injury, clip migration, and inadequate sampling. Informed written consent was given. The usual time out protocol was performed immediately prior to the procedure. Using sterile technique with chlorhexidine as skin antisepsis, 1% lidocaine and 1% lidocaine with epinephrine as local anesthetic, under stereotactic guidance, a 9 gauge Brevera vacuum assisted device was used to perform core needle biopsy of architectural distortion involving the UPPER OUTER QUADRANT of the RIGHT breast at MIDDLE depth using a SUPERIOR approach. Specimen radiograph was performed showing soft tissue and scattered calcifications in multiple samples. Lesion quadrant: UPPER OUTER QUADRANT. At the conclusion of the procedure, an X shaped tissue marker clip was deployed into the biopsy cavity. Follow-up 2-view mammogram was performed and dictated separately. IMPRESSION: Stereotactic-guided biopsy of architectural distortion involving the UPPER OUTER QUADRANT of the RIGHT breast at MIDDLE depth. No apparent complications. Electronically Signed: By: Evangeline Dakin M.D. On: 03/23/2018 08:25   Mm Rt Plc Breast Loc Dev   1st Lesion  Inc Mammo Guide  Result Date: 04/01/2018 CLINICAL DATA:  Recently diagnosed invasive mammary carcinoma in the upper-outer quadrant of the right breast. EXAM: NEEDLE LOCALIZATION OF THE RIGHT BREAST WITH MAMMO GUIDANCE COMPARISON:  Previous exams. FINDINGS: Patient presents for needle localization prior to right lumpectomy. I met with the patient and we discussed the procedure of needle localization including benefits and alternatives. We discussed the high likelihood of a successful procedure. We discussed the risks of the procedure, including infection, bleeding, tissue injury, and further surgery. Informed, written consent was given. The usual time-out protocol was performed immediately prior to the procedure. Using  mammographic guidance, sterile technique, 1% lidocaine and a 7 cm modified Kopans needle, the recently placed X shaped biopsy marker clip in the upper-outer quadrant of the right breast was localized using cephalad approach. The images were marked for Dr. Windell Moment. IMPRESSION: Needle localization right breast. No apparent complications. Electronically Signed   By: Claudie Revering M.D.   On: 04/01/2018 09:26    ASSESSMENT: Clinical stage IA ER/PR positive, HER-2 negative invasive carcinoma of the upper outer quadrant of the right breast.  Oncotype DX score 6, low risk.  PLAN:    1. Clinical stage IA ER/PR positive, HER-2 negative invasive carcinoma of the upper outer quadrant of the right breast: Patient underwent lumpectomy on Apr 01, 2018 confirming the staging of disease.  She had an Oncotype DX score of 6 which confers a low risk therefore patient does not require adjuvant chemotherapy.  She has consultation with radiation oncology to further discuss adjuvant XRT.  Given the ER/PR status of her tumor, she  will benefit from aromatase inhibitor at the conclusion of her XRT.  Return to clinic in approximately 2 months for further evaluation and initiation of letrozole.  Approximately 30 minutes was spent in discussion of which greater than 50% was consultation.    Patient expressed understanding and was in agreement with this plan. She also understands that She can call clinic at any time with any questions, concerns, or complaints.   Cancer Staging Primary cancer of upper outer quadrant of right female breast [facility]) Staging form: Breast, AJCC 8th Edition - Clinical stage from 03/26/2018: Stage IA (cT1b, cN0, cM0, G2, ER+, PR+, HER2-) - Signed by Lloyd Huger, MD on 03/26/2018   Lloyd Huger, MD   04/21/2018 8:22 AM

## 2018-04-15 ENCOUNTER — Encounter: Payer: Self-pay | Admitting: Oncology

## 2018-04-15 ENCOUNTER — Ambulatory Visit
Admission: RE | Admit: 2018-04-15 | Discharge: 2018-04-15 | Disposition: A | Discharge status: Home / Self Care | Payer: PPO | Source: Ambulatory Visit | Attending: Radiation Oncology | Admitting: Radiation Oncology

## 2018-04-15 ENCOUNTER — Other Ambulatory Visit: Payer: Self-pay

## 2018-04-15 ENCOUNTER — Inpatient Hospital Stay (HOSPITAL_BASED_OUTPATIENT_CLINIC_OR_DEPARTMENT_OTHER): Payer: PPO | Admitting: Oncology

## 2018-04-15 VITALS — BP 125/84 | HR 78 | Temp 97.9°F | Resp 12 | Ht 62.5 in | Wt 210.3 lb

## 2018-04-15 DIAGNOSIS — C50411 Malignant neoplasm of upper-outer quadrant of right female breast: Secondary | ICD-10-CM | POA: Diagnosis not present

## 2018-04-15 DIAGNOSIS — Z170 Estrogen receptor positive status [ER+]: Secondary | ICD-10-CM | POA: Diagnosis not present

## 2018-04-15 NOTE — Consult Note (Signed)
NEW PATIENT EVALUATION  Name: Susan Myers  MRN: [id number]  Date:   04/15/2018     DOB: 1951-02-27   This 67 y.o. female patient presents to the clinic for initial evaluation of stage I (T1 CNX M0) invasive mammary carcinoma of the right breast status post wide local excision ER/PR positive HER-2/neu negative.  REFERRING PHYSICIAN: Idelle Crouch, MD  CHIEF COMPLAINT: No chief complaint on file.   DIAGNOSIS: The encounter diagnosis was Primary cancer of upper outer quadrant of right female breast ([place]).   PREVIOUS INVESTIGATIONS:  Mammogram ultrasound reviewed Pathology reports reviewed Clinical notes reviewed  HPI: patient is a 67 year old female who presented with an abnormal mammogram of her right breast showing area of architectural distortion the upper outer quadrant confirmed on ultrasound for which stereotactic core needle biopsy was recommended and performed. This was positive for invasive mammary carcinoma.she went on to have a wide local excision for a 1.5 cm overall grade 2 invasive mammary carcinoma.margins were clear at least 15 mm. Lymph vascular invasion was not identified. Unfortunately sentinel lymph node was lost at the time of surgery. Tumor again was strongly ER/PR positive HER-2/neu not overexpressed.her initial ultrasound of the right axilla was negative for adenopathy She's been seen by medical oncology with recommendation for antiestrogen therapy after completion of radiation. She is seen today for radiation oncology opinion. She is doing well she specifically denies breast tenderness cough or bone pain.  PLANNED TREATMENT REGIMEN: right whole breast and peripheral lymphatic radiation  PAST MEDICAL HISTORY:  has a past medical history of Complication of anesthesia, Delusional disorder ([place]), DVT (deep venous thrombosis) ([place]) (06/2016), GERD (gastroesophageal reflux disease), History of kidney stones, blood clots, Hypercholesterolemia, Hypothyroidism,  Kidney stones, PONV (postoperative nausea and vomiting), and Thyroid disease.    PAST SURGICAL HISTORY:  Past Surgical History:  Procedure Laterality Date  . ABDOMINAL HYSTERECTOMY    . APPENDECTOMY    . BREAST BIOPSY Right 03/23/2018   Affirm Bx- path pending- X clip  . BREAST CYST ASPIRATION Bilateral 1990  . COLONOSCOPY    . IVC FILTER INSERTION    . PARTIAL MASTECTOMY WITH NEEDLE LOCALIZATION Right 04/01/2018   Procedure: PARTIAL MASTECTOMY WITH NEEDLE LOCALIZATION;  Surgeon: Herbert Pun, MD;  Location: [facility];  Service: General;  Laterality: Right;  . PERIPHERAL VASCULAR CATHETERIZATION N/A 08/06/2016   Procedure: IVC Filter Insertion;  Surgeon: Katha Cabal, MD;  Location: [place] [facility];  Service: Cardiovascular;  Laterality: N/A;  . PERIPHERAL VASCULAR CATHETERIZATION Left 08/06/2016   Procedure: Lower Extremity Venography;  Surgeon: Katha Cabal, MD;  Location: [place] [facility];  Service: Cardiovascular;  Laterality: Left;  . PERIPHERAL VASCULAR CATHETERIZATION  08/06/2016   Procedure: Lower Extremity Intervention;  Surgeon: Katha Cabal, MD;  Location: [place] [facility];  Service: Cardiovascular;;  . PERIPHERAL VASCULAR CATHETERIZATION N/A 12/16/2016   Procedure: IVC Filter Removal;  Surgeon: Katha Cabal, MD;  Location: [place] [facility];  Service: Cardiovascular;  Laterality: N/A;  . SENTINEL NODE BIOPSY Right 04/01/2018   Procedure: SENTINEL NODE BIOPSY;  Surgeon: Herbert Pun, MD;  Location: [facility];  Service: General;  Laterality: Right;  . TONSILLECTOMY      FAMILY HISTORY: family history is not on file.  SOCIAL HISTORY:  reports that she has never smoked. She has never used smokeless tobacco. She reports that she does not drink alcohol or use drugs.  ALLERGIES: Levaquin [levofloxacin in d5w]; Macrolides and ketolides; Penicillins; and Sulfa antibiotics  MEDICATIONS:  Current Outpatient Medications  Medication Sig  Dispense Refill  . acetaminophen (TYLENOL) 500 MG tablet Take 500 mg by mouth every 6 (six) hours as needed for moderate pain or headache.    . Cholecalciferol (VITAMIN D PO) Take 1 tablet by mouth once a week.    . diphenhydrAMINE (BENADRYL) 25 MG tablet Take 25 mg by mouth at bedtime as needed for sleep.    . hydrochlorothiazide (HYDRODIURIL) 25 MG tablet Take 25 mg by mouth daily.    Marland Kitchen levothyroxine (SYNTHROID, LEVOTHROID) 100 MCG tablet Take 1 tablet (100 mcg total) by mouth daily before breakfast. 30 tablet 0  . meloxicam (MOBIC) 7.5 MG tablet Take 7.5 mg by mouth daily as needed for pain.    Marland Kitchen omeprazole (PRILOSEC) 20 MG capsule Take 20 mg by mouth daily as needed (acid reflux).     . phenylephrine (SUDAFED PE) 10 MG TABS tablet Take 10 mg by mouth every 6 (six) hours as needed (congestion).    . risperiDONE (RISPERDAL) 1 MG tablet Take 1 mg by mouth at bedtime.    . rivaroxaban (XARELTO) 20 MG TABS tablet Take 20 mg by mouth daily.    . simvastatin (ZOCOR) 20 MG tablet Take 20 mg by mouth every evening.      No current facility-administered medications for this encounter.     ECOG PERFORMANCE STATUS:  0 - Asymptomatic  REVIEW OF SYSTEMS:  Patient denies any weight loss, fatigue, weakness, fever, chills or night sweats. Patient denies any loss of vision, blurred vision. Patient denies any ringing  of the ears or hearing loss. No irregular heartbeat. Patient denies heart murmur or history of fainting. Patient denies any chest pain or pain radiating to her upper extremities. Patient denies any shortness of breath, difficulty breathing at night, cough or hemoptysis. Patient denies any swelling in the lower legs. Patient denies any nausea vomiting, vomiting of blood, or coffee ground material in the vomitus. Patient denies any stomach pain. Patient states has had normal bowel movements no significant constipation or diarrhea. Patient denies any dysuria, hematuria or significant nocturia. Patient  denies any problems walking, swelling in the joints or loss of balance. Patient denies any skin changes, loss of hair or loss of weight. Patient denies any excessive worrying or anxiety or significant depression. Patient denies any problems with insomnia. Patient denies excessive thirst, polyuria, polydipsia. Patient denies any swollen glands, patient denies easy bruising or easy bleeding. Patient denies any recent infections, allergies or URI. Patient "s visual fields have not changed significantly in recent time.    PHYSICAL EXAM: There were no vitals taken for this visit. Right breast is wide local excision scar which is healing well. No dominant mass or nodularity is noted in either breast in 2 positions examined. No axillary or supraclavicular adenopathy is identified. Well-developed well-nourished patient in NAD. HEENT reveals PERLA, EOMI, discs not visualized.  Oral cavity is clear. No oral mucosal lesions are identified. Neck is clear without evidence of cervical or supraclavicular adenopathy. Lungs are clear to A&P. Cardiac examination is essentially unremarkable with regular rate and rhythm without murmur rub or thrill. Abdomen is benign with no organomegaly or masses noted. Motor sensory and DTR levels are equal and symmetric in the upper and lower extremities. Cranial nerves II through XII are grossly intact. Proprioception is intact. No peripheral adenopathy or edema is identified. No motor or sensory levels are noted. Crude visual fields are within normal range.  LABORATORY DATA: pathology reports reviewed  RADIOLOGY RESULTS:mammogram and ultrasound reviewed   IMPRESSION: stage I invasive mammary carcinoma of the right breast status post wide local excisionin 67 year old female ER/PR positive HER-2/neu negative with unfortunate loss of sentinel lymph node at the time of wide local excision.  PLAN: patient is seen medical oncology who is declining chemotherapy she will be on  antiestrogen therapy after completion of radiation. I have recommended right whole breast radiation as well as peripheral lymphatic radiation. I ran the [facility] on possible sentinel node involvement putting her factors and comes out 18%. Based on that I would treat her peripheral lymphatics with very little additional morbidity. Would treat her breast andlymphatics to 5040 cGy in 28 fractions boost her scar another 1400 cGy using electron beam. Risks and benefits of treatment including skin reaction fatigue alteration of blood counts possible inclusion of superficial lung slight possibility of lymphedema of her right upper extremity all were discussed in detail with the patient and her husband. They both seem to comprehend my treatment plan well. I have personally ordered and scheduled CT simulation for next week. Patient and husband both seem to comprehend my treatment plan well.  I would like to take this opportunity to thank you for allowing me to participate in the care of your patient.[name], MD

## 2018-04-16 ENCOUNTER — Encounter: Payer: Self-pay | Admitting: Oncology

## 2018-04-22 ENCOUNTER — Ambulatory Visit
Admission: RE | Admit: 2018-04-22 | Discharge: 2018-04-22 | Disposition: A | Discharge status: Home / Self Care | Payer: PPO | Source: Ambulatory Visit | Attending: Radiation Oncology | Admitting: Radiation Oncology

## 2018-04-22 DIAGNOSIS — C50411 Malignant neoplasm of upper-outer quadrant of right female breast: Secondary | ICD-10-CM | POA: Insufficient documentation

## 2018-04-22 DIAGNOSIS — Z170 Estrogen receptor positive status [ER+]: Secondary | ICD-10-CM | POA: Diagnosis not present

## 2018-04-23 ENCOUNTER — Other Ambulatory Visit: Payer: Self-pay | Admitting: *Deleted

## 2018-04-23 DIAGNOSIS — C50411 Malignant neoplasm of upper-outer quadrant of right female breast: Secondary | ICD-10-CM

## 2018-04-26 DIAGNOSIS — C50411 Malignant neoplasm of upper-outer quadrant of right female breast: Secondary | ICD-10-CM | POA: Insufficient documentation

## 2018-04-26 DIAGNOSIS — K219 Gastro-esophageal reflux disease without esophagitis: Secondary | ICD-10-CM | POA: Insufficient documentation

## 2018-04-26 DIAGNOSIS — Z9889 Other specified postprocedural states: Secondary | ICD-10-CM | POA: Diagnosis not present

## 2018-04-26 DIAGNOSIS — Z170 Estrogen receptor positive status [ER+]: Secondary | ICD-10-CM | POA: Diagnosis not present

## 2018-04-26 DIAGNOSIS — Z86718 Personal history of other venous thrombosis and embolism: Secondary | ICD-10-CM | POA: Diagnosis not present

## 2018-04-26 DIAGNOSIS — E7800 Pure hypercholesterolemia, unspecified: Secondary | ICD-10-CM | POA: Insufficient documentation

## 2018-04-26 DIAGNOSIS — E039 Hypothyroidism, unspecified: Secondary | ICD-10-CM | POA: Diagnosis not present

## 2018-04-26 DIAGNOSIS — Z510 Encounter for antineoplastic radiation therapy: Secondary | ICD-10-CM | POA: Insufficient documentation

## 2018-04-29 ENCOUNTER — Ambulatory Visit
Admission: RE | Admit: 2018-04-29 | Discharge: 2018-04-29 | Disposition: A | Discharge status: Home / Self Care | Payer: PPO | Source: Ambulatory Visit | Attending: Radiation Oncology | Admitting: Radiation Oncology

## 2018-04-29 DIAGNOSIS — C50411 Malignant neoplasm of upper-outer quadrant of right female breast: Secondary | ICD-10-CM | POA: Diagnosis not present

## 2018-04-29 DIAGNOSIS — Z510 Encounter for antineoplastic radiation therapy: Secondary | ICD-10-CM | POA: Diagnosis not present

## 2018-04-29 DIAGNOSIS — Z170 Estrogen receptor positive status [ER+]: Secondary | ICD-10-CM | POA: Diagnosis not present

## 2018-05-03 ENCOUNTER — Ambulatory Visit
Admission: RE | Admit: 2018-05-03 | Discharge: 2018-05-03 | Disposition: A | Discharge status: Home / Self Care | Payer: PPO | Source: Ambulatory Visit | Attending: Radiation Oncology | Admitting: Radiation Oncology

## 2018-05-03 DIAGNOSIS — C50411 Malignant neoplasm of upper-outer quadrant of right female breast: Secondary | ICD-10-CM | POA: Diagnosis not present

## 2018-05-03 DIAGNOSIS — Z510 Encounter for antineoplastic radiation therapy: Secondary | ICD-10-CM | POA: Diagnosis not present

## 2018-05-03 DIAGNOSIS — Z170 Estrogen receptor positive status [ER+]: Secondary | ICD-10-CM | POA: Diagnosis not present

## 2018-05-04 ENCOUNTER — Ambulatory Visit
Admission: RE | Admit: 2018-05-04 | Discharge: 2018-05-04 | Disposition: A | Discharge status: Home / Self Care | Payer: PPO | Source: Ambulatory Visit | Attending: Radiation Oncology | Admitting: Radiation Oncology

## 2018-05-04 DIAGNOSIS — Z510 Encounter for antineoplastic radiation therapy: Secondary | ICD-10-CM | POA: Diagnosis not present

## 2018-05-04 DIAGNOSIS — C50411 Malignant neoplasm of upper-outer quadrant of right female breast: Secondary | ICD-10-CM | POA: Diagnosis not present

## 2018-05-04 DIAGNOSIS — Z170 Estrogen receptor positive status [ER+]: Secondary | ICD-10-CM | POA: Diagnosis not present

## 2018-05-05 ENCOUNTER — Ambulatory Visit
Admission: RE | Admit: 2018-05-05 | Discharge: 2018-05-05 | Disposition: A | Discharge status: Home / Self Care | Payer: PPO | Source: Ambulatory Visit | Attending: Radiation Oncology | Admitting: Radiation Oncology

## 2018-05-05 DIAGNOSIS — C50411 Malignant neoplasm of upper-outer quadrant of right female breast: Secondary | ICD-10-CM | POA: Diagnosis not present

## 2018-05-05 DIAGNOSIS — Z170 Estrogen receptor positive status [ER+]: Secondary | ICD-10-CM | POA: Diagnosis not present

## 2018-05-05 DIAGNOSIS — Z510 Encounter for antineoplastic radiation therapy: Secondary | ICD-10-CM | POA: Diagnosis not present

## 2018-05-06 ENCOUNTER — Ambulatory Visit
Admission: RE | Admit: 2018-05-06 | Discharge: 2018-05-06 | Disposition: A | Discharge status: Home / Self Care | Payer: PPO | Source: Ambulatory Visit | Attending: Radiation Oncology | Admitting: Radiation Oncology

## 2018-05-06 DIAGNOSIS — Z510 Encounter for antineoplastic radiation therapy: Secondary | ICD-10-CM | POA: Diagnosis not present

## 2018-05-06 DIAGNOSIS — Z170 Estrogen receptor positive status [ER+]: Secondary | ICD-10-CM | POA: Diagnosis not present

## 2018-05-06 DIAGNOSIS — C50411 Malignant neoplasm of upper-outer quadrant of right female breast: Secondary | ICD-10-CM | POA: Diagnosis not present

## 2018-05-07 ENCOUNTER — Ambulatory Visit
Admission: RE | Admit: 2018-05-07 | Discharge: 2018-05-07 | Disposition: A | Discharge status: Home / Self Care | Payer: PPO | Source: Ambulatory Visit | Attending: Radiation Oncology | Admitting: Radiation Oncology

## 2018-05-07 DIAGNOSIS — Z170 Estrogen receptor positive status [ER+]: Secondary | ICD-10-CM | POA: Diagnosis not present

## 2018-05-07 DIAGNOSIS — C50411 Malignant neoplasm of upper-outer quadrant of right female breast: Secondary | ICD-10-CM | POA: Diagnosis not present

## 2018-05-07 DIAGNOSIS — Z510 Encounter for antineoplastic radiation therapy: Secondary | ICD-10-CM | POA: Diagnosis not present

## 2018-05-10 ENCOUNTER — Ambulatory Visit
Admission: RE | Admit: 2018-05-10 | Discharge: 2018-05-10 | Disposition: A | Discharge status: Home / Self Care | Payer: PPO | Source: Ambulatory Visit | Attending: Radiation Oncology | Admitting: Radiation Oncology

## 2018-05-10 DIAGNOSIS — Z170 Estrogen receptor positive status [ER+]: Secondary | ICD-10-CM | POA: Diagnosis not present

## 2018-05-10 DIAGNOSIS — C50411 Malignant neoplasm of upper-outer quadrant of right female breast: Secondary | ICD-10-CM | POA: Diagnosis not present

## 2018-05-10 DIAGNOSIS — Z510 Encounter for antineoplastic radiation therapy: Secondary | ICD-10-CM | POA: Diagnosis not present

## 2018-05-11 ENCOUNTER — Ambulatory Visit
Admission: RE | Admit: 2018-05-11 | Discharge: 2018-05-11 | Disposition: A | Discharge status: Home / Self Care | Payer: PPO | Source: Ambulatory Visit | Attending: Radiation Oncology | Admitting: Radiation Oncology

## 2018-05-11 DIAGNOSIS — Z510 Encounter for antineoplastic radiation therapy: Secondary | ICD-10-CM | POA: Diagnosis not present

## 2018-05-11 DIAGNOSIS — Z170 Estrogen receptor positive status [ER+]: Secondary | ICD-10-CM | POA: Diagnosis not present

## 2018-05-11 DIAGNOSIS — C50411 Malignant neoplasm of upper-outer quadrant of right female breast: Secondary | ICD-10-CM | POA: Diagnosis not present

## 2018-05-12 ENCOUNTER — Ambulatory Visit
Admission: RE | Admit: 2018-05-12 | Discharge: 2018-05-12 | Disposition: A | Discharge status: Home / Self Care | Payer: PPO | Source: Ambulatory Visit | Attending: Radiation Oncology | Admitting: Radiation Oncology

## 2018-05-12 DIAGNOSIS — C50411 Malignant neoplasm of upper-outer quadrant of right female breast: Secondary | ICD-10-CM | POA: Diagnosis not present

## 2018-05-12 DIAGNOSIS — Z510 Encounter for antineoplastic radiation therapy: Secondary | ICD-10-CM | POA: Diagnosis not present

## 2018-05-12 DIAGNOSIS — Z170 Estrogen receptor positive status [ER+]: Secondary | ICD-10-CM | POA: Diagnosis not present

## 2018-05-13 ENCOUNTER — Ambulatory Visit
Admission: RE | Admit: 2018-05-13 | Discharge: 2018-05-13 | Disposition: A | Discharge status: Home / Self Care | Payer: PPO | Source: Ambulatory Visit | Attending: Radiation Oncology | Admitting: Radiation Oncology

## 2018-05-13 DIAGNOSIS — Z510 Encounter for antineoplastic radiation therapy: Secondary | ICD-10-CM | POA: Diagnosis not present

## 2018-05-13 DIAGNOSIS — C50411 Malignant neoplasm of upper-outer quadrant of right female breast: Secondary | ICD-10-CM | POA: Diagnosis not present

## 2018-05-13 DIAGNOSIS — Z170 Estrogen receptor positive status [ER+]: Secondary | ICD-10-CM | POA: Diagnosis not present

## 2018-05-14 ENCOUNTER — Ambulatory Visit
Admission: RE | Admit: 2018-05-14 | Discharge: 2018-05-14 | Disposition: A | Discharge status: Home / Self Care | Payer: PPO | Source: Ambulatory Visit | Attending: Radiation Oncology | Admitting: Radiation Oncology

## 2018-05-14 DIAGNOSIS — C50411 Malignant neoplasm of upper-outer quadrant of right female breast: Secondary | ICD-10-CM | POA: Diagnosis not present

## 2018-05-14 DIAGNOSIS — Z170 Estrogen receptor positive status [ER+]: Secondary | ICD-10-CM | POA: Diagnosis not present

## 2018-05-14 DIAGNOSIS — Z510 Encounter for antineoplastic radiation therapy: Secondary | ICD-10-CM | POA: Diagnosis not present

## 2018-05-17 ENCOUNTER — Ambulatory Visit: Admission: RE | Admit: 2018-05-17 | Payer: PPO | Source: Ambulatory Visit

## 2018-05-17 ENCOUNTER — Ambulatory Visit: Payer: PPO

## 2018-05-18 ENCOUNTER — Other Ambulatory Visit: Payer: Self-pay

## 2018-05-18 ENCOUNTER — Inpatient Hospital Stay: Payer: PPO | Attending: Radiation Oncology

## 2018-05-18 ENCOUNTER — Ambulatory Visit
Admission: RE | Admit: 2018-05-18 | Discharge: 2018-05-18 | Disposition: A | Discharge status: Home / Self Care | Payer: PPO | Source: Ambulatory Visit | Attending: Radiation Oncology | Admitting: Radiation Oncology

## 2018-05-18 DIAGNOSIS — C50411 Malignant neoplasm of upper-outer quadrant of right female breast: Secondary | ICD-10-CM | POA: Insufficient documentation

## 2018-05-18 DIAGNOSIS — Z170 Estrogen receptor positive status [ER+]: Secondary | ICD-10-CM | POA: Diagnosis not present

## 2018-05-18 DIAGNOSIS — Z510 Encounter for antineoplastic radiation therapy: Secondary | ICD-10-CM | POA: Diagnosis not present

## 2018-05-18 LAB — CBC
HEMATOCRIT: 42.2 % (ref 35.0–47.0)
Hemoglobin: 14.4 g/dL (ref 12.0–16.0)
MCH: 31.8 pg (ref 26.0–34.0)
MCHC: 34.2 g/dL (ref 32.0–36.0)
MCV: 93 fL (ref 80.0–100.0)
PLATELETS: 180 10*3/uL (ref 150–440)
RBC: 4.53 MIL/uL (ref 3.80–5.20)
RDW: 13.8 % (ref 11.5–14.5)
WBC: 6.2 10*3/uL (ref 3.6–11.0)

## 2018-05-19 ENCOUNTER — Ambulatory Visit
Admission: RE | Admit: 2018-05-19 | Discharge: 2018-05-19 | Disposition: A | Discharge status: Home / Self Care | Payer: PPO | Source: Ambulatory Visit | Attending: Radiation Oncology | Admitting: Radiation Oncology

## 2018-05-19 DIAGNOSIS — Z510 Encounter for antineoplastic radiation therapy: Secondary | ICD-10-CM | POA: Diagnosis not present

## 2018-05-19 DIAGNOSIS — Z170 Estrogen receptor positive status [ER+]: Secondary | ICD-10-CM | POA: Diagnosis not present

## 2018-05-19 DIAGNOSIS — C50411 Malignant neoplasm of upper-outer quadrant of right female breast: Secondary | ICD-10-CM | POA: Diagnosis not present

## 2018-05-20 ENCOUNTER — Ambulatory Visit
Admission: RE | Admit: 2018-05-20 | Discharge: 2018-05-20 | Disposition: A | Discharge status: Home / Self Care | Payer: PPO | Source: Ambulatory Visit | Attending: Radiation Oncology | Admitting: Radiation Oncology

## 2018-05-20 DIAGNOSIS — C50411 Malignant neoplasm of upper-outer quadrant of right female breast: Secondary | ICD-10-CM | POA: Diagnosis not present

## 2018-05-20 DIAGNOSIS — Z510 Encounter for antineoplastic radiation therapy: Secondary | ICD-10-CM | POA: Diagnosis not present

## 2018-05-20 DIAGNOSIS — Z170 Estrogen receptor positive status [ER+]: Secondary | ICD-10-CM | POA: Diagnosis not present

## 2018-05-21 ENCOUNTER — Ambulatory Visit
Admission: RE | Admit: 2018-05-21 | Discharge: 2018-05-21 | Disposition: A | Discharge status: Home / Self Care | Payer: PPO | Source: Ambulatory Visit | Attending: Radiation Oncology | Admitting: Radiation Oncology

## 2018-05-21 DIAGNOSIS — Z510 Encounter for antineoplastic radiation therapy: Secondary | ICD-10-CM | POA: Diagnosis not present

## 2018-05-21 DIAGNOSIS — C50411 Malignant neoplasm of upper-outer quadrant of right female breast: Secondary | ICD-10-CM | POA: Diagnosis not present

## 2018-05-21 DIAGNOSIS — Z170 Estrogen receptor positive status [ER+]: Secondary | ICD-10-CM | POA: Diagnosis not present

## 2018-05-24 ENCOUNTER — Ambulatory Visit
Admission: RE | Admit: 2018-05-24 | Discharge: 2018-05-24 | Disposition: A | Discharge status: Home / Self Care | Payer: PPO | Source: Ambulatory Visit | Attending: Radiation Oncology | Admitting: Radiation Oncology

## 2018-05-24 ENCOUNTER — Ambulatory Visit: Payer: PPO

## 2018-05-24 DIAGNOSIS — C50411 Malignant neoplasm of upper-outer quadrant of right female breast: Secondary | ICD-10-CM | POA: Insufficient documentation

## 2018-05-24 DIAGNOSIS — Z170 Estrogen receptor positive status [ER+]: Secondary | ICD-10-CM | POA: Diagnosis not present

## 2018-05-24 DIAGNOSIS — Z510 Encounter for antineoplastic radiation therapy: Secondary | ICD-10-CM | POA: Insufficient documentation

## 2018-05-25 ENCOUNTER — Ambulatory Visit
Admission: RE | Admit: 2018-05-25 | Discharge: 2018-05-25 | Disposition: A | Discharge status: Home / Self Care | Payer: PPO | Source: Ambulatory Visit | Attending: Radiation Oncology | Admitting: Radiation Oncology

## 2018-05-25 DIAGNOSIS — C50411 Malignant neoplasm of upper-outer quadrant of right female breast: Secondary | ICD-10-CM | POA: Diagnosis not present

## 2018-05-25 DIAGNOSIS — Z510 Encounter for antineoplastic radiation therapy: Secondary | ICD-10-CM | POA: Diagnosis not present

## 2018-05-25 DIAGNOSIS — Z170 Estrogen receptor positive status [ER+]: Secondary | ICD-10-CM | POA: Diagnosis not present

## 2018-05-26 ENCOUNTER — Ambulatory Visit: Payer: PPO

## 2018-05-26 ENCOUNTER — Ambulatory Visit
Admission: RE | Admit: 2018-05-26 | Discharge: 2018-05-26 | Disposition: A | Discharge status: Home / Self Care | Payer: PPO | Source: Ambulatory Visit | Attending: Radiation Oncology | Admitting: Radiation Oncology

## 2018-05-26 DIAGNOSIS — Z510 Encounter for antineoplastic radiation therapy: Secondary | ICD-10-CM | POA: Diagnosis not present

## 2018-05-26 DIAGNOSIS — Z170 Estrogen receptor positive status [ER+]: Secondary | ICD-10-CM | POA: Diagnosis not present

## 2018-05-26 DIAGNOSIS — C50411 Malignant neoplasm of upper-outer quadrant of right female breast: Secondary | ICD-10-CM | POA: Diagnosis not present

## 2018-05-28 ENCOUNTER — Ambulatory Visit
Admission: RE | Admit: 2018-05-28 | Discharge: 2018-05-28 | Disposition: A | Discharge status: Home / Self Care | Payer: PPO | Source: Ambulatory Visit | Attending: Radiation Oncology | Admitting: Radiation Oncology

## 2018-05-28 DIAGNOSIS — C50411 Malignant neoplasm of upper-outer quadrant of right female breast: Secondary | ICD-10-CM | POA: Diagnosis not present

## 2018-05-28 DIAGNOSIS — Z510 Encounter for antineoplastic radiation therapy: Secondary | ICD-10-CM | POA: Diagnosis not present

## 2018-05-28 DIAGNOSIS — Z170 Estrogen receptor positive status [ER+]: Secondary | ICD-10-CM | POA: Diagnosis not present

## 2018-05-31 ENCOUNTER — Ambulatory Visit
Admission: RE | Admit: 2018-05-31 | Discharge: 2018-05-31 | Disposition: A | Discharge status: Home / Self Care | Payer: PPO | Source: Ambulatory Visit | Attending: Urology | Admitting: Urology

## 2018-05-31 ENCOUNTER — Ambulatory Visit
Admission: RE | Admit: 2018-05-31 | Discharge: 2018-05-31 | Disposition: A | Discharge status: Home / Self Care | Payer: PPO | Source: Ambulatory Visit | Attending: Radiation Oncology | Admitting: Radiation Oncology

## 2018-05-31 DIAGNOSIS — N200 Calculus of kidney: Secondary | ICD-10-CM

## 2018-05-31 DIAGNOSIS — Z170 Estrogen receptor positive status [ER+]: Secondary | ICD-10-CM | POA: Diagnosis not present

## 2018-05-31 DIAGNOSIS — C50411 Malignant neoplasm of upper-outer quadrant of right female breast: Secondary | ICD-10-CM | POA: Diagnosis not present

## 2018-05-31 DIAGNOSIS — Z510 Encounter for antineoplastic radiation therapy: Secondary | ICD-10-CM | POA: Diagnosis not present

## 2018-06-01 ENCOUNTER — Ambulatory Visit
Admission: RE | Admit: 2018-06-01 | Discharge: 2018-06-01 | Disposition: A | Discharge status: Home / Self Care | Payer: PPO | Source: Ambulatory Visit | Attending: Radiation Oncology | Admitting: Radiation Oncology

## 2018-06-01 ENCOUNTER — Inpatient Hospital Stay: Payer: PPO | Attending: Radiation Oncology

## 2018-06-01 DIAGNOSIS — C50411 Malignant neoplasm of upper-outer quadrant of right female breast: Secondary | ICD-10-CM | POA: Insufficient documentation

## 2018-06-01 DIAGNOSIS — Z170 Estrogen receptor positive status [ER+]: Secondary | ICD-10-CM | POA: Diagnosis not present

## 2018-06-01 DIAGNOSIS — Z510 Encounter for antineoplastic radiation therapy: Secondary | ICD-10-CM | POA: Diagnosis not present

## 2018-06-01 LAB — CBC
HCT: 42.8 % (ref 35.0–47.0)
Hemoglobin: 14.6 g/dL (ref 12.0–16.0)
MCH: 31.7 pg (ref 26.0–34.0)
MCHC: 34.1 g/dL (ref 32.0–36.0)
MCV: 92.8 fL (ref 80.0–100.0)
PLATELETS: 176 10*3/uL (ref 150–440)
RBC: 4.61 MIL/uL (ref 3.80–5.20)
RDW: 13.9 % (ref 11.5–14.5)
WBC: 5.5 10*3/uL (ref 3.6–11.0)

## 2018-06-02 ENCOUNTER — Ambulatory Visit
Admission: RE | Admit: 2018-06-02 | Discharge: 2018-06-02 | Disposition: A | Discharge status: Home / Self Care | Payer: PPO | Source: Ambulatory Visit | Attending: Radiation Oncology | Admitting: Radiation Oncology

## 2018-06-02 DIAGNOSIS — C50411 Malignant neoplasm of upper-outer quadrant of right female breast: Secondary | ICD-10-CM | POA: Diagnosis not present

## 2018-06-02 DIAGNOSIS — Z170 Estrogen receptor positive status [ER+]: Secondary | ICD-10-CM | POA: Diagnosis not present

## 2018-06-02 DIAGNOSIS — Z510 Encounter for antineoplastic radiation therapy: Secondary | ICD-10-CM | POA: Diagnosis not present

## 2018-06-03 ENCOUNTER — Ambulatory Visit
Admission: RE | Admit: 2018-06-03 | Discharge: 2018-06-03 | Disposition: A | Discharge status: Home / Self Care | Payer: PPO | Source: Ambulatory Visit | Attending: Radiation Oncology | Admitting: Radiation Oncology

## 2018-06-03 DIAGNOSIS — Z510 Encounter for antineoplastic radiation therapy: Secondary | ICD-10-CM | POA: Diagnosis not present

## 2018-06-03 DIAGNOSIS — C50411 Malignant neoplasm of upper-outer quadrant of right female breast: Secondary | ICD-10-CM | POA: Diagnosis not present

## 2018-06-03 DIAGNOSIS — Z170 Estrogen receptor positive status [ER+]: Secondary | ICD-10-CM | POA: Diagnosis not present

## 2018-06-04 ENCOUNTER — Ambulatory Visit
Admission: RE | Admit: 2018-06-04 | Discharge: 2018-06-04 | Disposition: A | Discharge status: Home / Self Care | Payer: PPO | Source: Ambulatory Visit | Attending: Radiation Oncology | Admitting: Radiation Oncology

## 2018-06-04 DIAGNOSIS — Z170 Estrogen receptor positive status [ER+]: Secondary | ICD-10-CM | POA: Diagnosis not present

## 2018-06-04 DIAGNOSIS — Z510 Encounter for antineoplastic radiation therapy: Secondary | ICD-10-CM | POA: Diagnosis not present

## 2018-06-04 DIAGNOSIS — C50411 Malignant neoplasm of upper-outer quadrant of right female breast: Secondary | ICD-10-CM | POA: Diagnosis not present

## 2018-06-07 ENCOUNTER — Ambulatory Visit
Admission: RE | Admit: 2018-06-07 | Discharge: 2018-06-07 | Disposition: A | Discharge status: Home / Self Care | Payer: PPO | Source: Ambulatory Visit | Attending: Radiation Oncology | Admitting: Radiation Oncology

## 2018-06-07 DIAGNOSIS — Z510 Encounter for antineoplastic radiation therapy: Secondary | ICD-10-CM | POA: Diagnosis not present

## 2018-06-07 DIAGNOSIS — C50411 Malignant neoplasm of upper-outer quadrant of right female breast: Secondary | ICD-10-CM | POA: Diagnosis not present

## 2018-06-07 DIAGNOSIS — Z170 Estrogen receptor positive status [ER+]: Secondary | ICD-10-CM | POA: Diagnosis not present

## 2018-06-08 ENCOUNTER — Ambulatory Visit
Admission: RE | Admit: 2018-06-08 | Discharge: 2018-06-08 | Disposition: A | Discharge status: Home / Self Care | Payer: PPO | Source: Ambulatory Visit | Attending: Radiation Oncology | Admitting: Radiation Oncology

## 2018-06-08 DIAGNOSIS — Z170 Estrogen receptor positive status [ER+]: Secondary | ICD-10-CM | POA: Diagnosis not present

## 2018-06-08 DIAGNOSIS — Z510 Encounter for antineoplastic radiation therapy: Secondary | ICD-10-CM | POA: Diagnosis not present

## 2018-06-08 DIAGNOSIS — C50411 Malignant neoplasm of upper-outer quadrant of right female breast: Secondary | ICD-10-CM | POA: Diagnosis not present

## 2018-06-09 ENCOUNTER — Ambulatory Visit
Admission: RE | Admit: 2018-06-09 | Discharge: 2018-06-09 | Disposition: A | Discharge status: Home / Self Care | Payer: PPO | Source: Ambulatory Visit | Attending: Radiation Oncology | Admitting: Radiation Oncology

## 2018-06-09 DIAGNOSIS — C50411 Malignant neoplasm of upper-outer quadrant of right female breast: Secondary | ICD-10-CM | POA: Diagnosis not present

## 2018-06-09 DIAGNOSIS — Z510 Encounter for antineoplastic radiation therapy: Secondary | ICD-10-CM | POA: Diagnosis not present

## 2018-06-09 DIAGNOSIS — Z170 Estrogen receptor positive status [ER+]: Secondary | ICD-10-CM | POA: Diagnosis not present

## 2018-06-10 ENCOUNTER — Ambulatory Visit: Admission: RE | Admit: 2018-06-10 | Payer: PPO | Source: Ambulatory Visit

## 2018-06-10 ENCOUNTER — Ambulatory Visit
Admission: RE | Admit: 2018-06-10 | Discharge: 2018-06-10 | Disposition: A | Discharge status: Home / Self Care | Payer: PPO | Source: Ambulatory Visit | Attending: Radiation Oncology | Admitting: Radiation Oncology

## 2018-06-10 DIAGNOSIS — Z170 Estrogen receptor positive status [ER+]: Secondary | ICD-10-CM | POA: Diagnosis not present

## 2018-06-10 DIAGNOSIS — C50411 Malignant neoplasm of upper-outer quadrant of right female breast: Secondary | ICD-10-CM | POA: Diagnosis not present

## 2018-06-10 DIAGNOSIS — Z510 Encounter for antineoplastic radiation therapy: Secondary | ICD-10-CM | POA: Diagnosis not present

## 2018-06-11 ENCOUNTER — Encounter: Payer: Self-pay | Admitting: Urology

## 2018-06-11 ENCOUNTER — Ambulatory Visit: Payer: PPO | Admitting: Urology

## 2018-06-11 ENCOUNTER — Ambulatory Visit
Admission: RE | Admit: 2018-06-11 | Discharge: 2018-06-11 | Disposition: A | Discharge status: Home / Self Care | Payer: PPO | Source: Ambulatory Visit | Attending: Radiation Oncology | Admitting: Radiation Oncology

## 2018-06-11 VITALS — BP 135/81 | HR 91 | Ht 62.0 in | Wt 207.0 lb

## 2018-06-11 DIAGNOSIS — N200 Calculus of kidney: Secondary | ICD-10-CM | POA: Diagnosis not present

## 2018-06-11 DIAGNOSIS — Z510 Encounter for antineoplastic radiation therapy: Secondary | ICD-10-CM | POA: Diagnosis not present

## 2018-06-11 DIAGNOSIS — R3129 Other microscopic hematuria: Secondary | ICD-10-CM | POA: Diagnosis not present

## 2018-06-11 DIAGNOSIS — Z170 Estrogen receptor positive status [ER+]: Secondary | ICD-10-CM | POA: Diagnosis not present

## 2018-06-11 DIAGNOSIS — N390 Urinary tract infection, site not specified: Secondary | ICD-10-CM | POA: Diagnosis not present

## 2018-06-11 DIAGNOSIS — C50411 Malignant neoplasm of upper-outer quadrant of right female breast: Secondary | ICD-10-CM | POA: Diagnosis not present

## 2018-06-11 NOTE — Progress Notes (Signed)
06/11/2018 11:06 AM   Tharon Aquas Irean Hong 1951-07-26 [id number]  Referring provider: Idelle Crouch, MD [facility] [place],  [id number]  Chief Complaint  Patient presents with  . Nephrolithiasis    1year w/KUB    HPI: 67 yo F with multiple GU issues who returns today for routine follow up.   Since last visit, she is being treated for breast cancer and is undergoing radiation.  She will be starting oral medication in the near future.   Gross hematuria/ microscopic hematuria  She has a complex history diagnosed with  LE DVT 07/2016 who underwent IVC filter placement who returned to the operating room on 12/16/2016 with Dr. Delana Meyer for attempted retrieval. This was unsuccessful due to the location of the hook which was embedded into the wall of the vena cava.  Shortly thereafter, she developed gross hematuria after resuming Xarelto therapy.    She passed an interval stone and her hematuria resolved.  Further workup, she will underwent a CT abdomen pelvis with contrast which showed no evidence of hematoma or extravasation around the filter. Incidentally, there is mild right hydronephrosis with evidence of urothelial thickening/enhancement along the proximal ureter.  In addition, there were 2 nonobstructing renal calculi measuring up to 7 mm on right and 2 mm within the LUP.  Cystoscopy was declined as she felt that her bleeding was related to stone passage.    She is chronic anticoagulation.  Several urinalyses over the past year were reviewed, most recently 03/25/2018 with 4-10 red blood cells per high-powered field.  This may have been a contaminated sample due to the presence of squamous epithelial cells.  Urine culture associated with this urine grew 50-100 beta-hemolytic strep.  Other urinalyses without blood.  Kidney stones She has a known history of kidney stones and was previously followed by Dr. Jacqlyn Larsen. She has passed quite large stones and  never recurred surgical intervention for these.  Stone composition 93% calcium oxalate monohydrate, 4% calcium oxalate dihydrate, 3% calcium phosphate.  She did undgo 01-UXNA urine metabolic workup last year. This showed low urinary volume of 1.44 L. Her urinary calcium was borderline elevated, and 190 mg per day. Excellent urinary citrate, 878 mg per day.  All other labs were within normal limits. Creatinine 0.4.  UTI No further UTIs or stones.  No urinary complaints today. G   PMH: Past Medical History:  Diagnosis Date  . Complication of anesthesia   . Delusional disorder ([place])   . DVT (deep venous thrombosis) ([place]) 06/2016  . GERD (gastroesophageal reflux disease)   . History of kidney stones   . Hx of blood clots   . Hypercholesterolemia   . Hypothyroidism   . Kidney stones   . PONV (postoperative nausea and vomiting)   . Thyroid disease     Surgical History: Past Surgical History:  Procedure Laterality Date  . ABDOMINAL HYSTERECTOMY    . APPENDECTOMY    . BREAST BIOPSY Right 03/23/2018   Affirm Bx- path pending- X clip  . BREAST CYST ASPIRATION Bilateral 1990  . COLONOSCOPY    . IVC FILTER INSERTION    . PARTIAL MASTECTOMY WITH NEEDLE LOCALIZATION Right 04/01/2018   Procedure: PARTIAL MASTECTOMY WITH NEEDLE LOCALIZATION;  Surgeon: Herbert Pun, MD;  Location: [facility] ORS;  Service: General;  Laterality: Right;  . PERIPHERAL VASCULAR CATHETERIZATION N/A 08/06/2016   Procedure: IVC Filter Insertion;  Surgeon: Katha Cabal, MD;  Location: [place] [facility];  Service: Cardiovascular;  Laterality: N/A;  . PERIPHERAL VASCULAR CATHETERIZATION Left 08/06/2016   Procedure: Lower Extremity Venography;  Surgeon: Katha Cabal, MD;  Location: [place] [facility];  Service: Cardiovascular;  Laterality: Left;  . PERIPHERAL VASCULAR CATHETERIZATION  08/06/2016   Procedure: Lower Extremity Intervention;  Surgeon: Katha Cabal, MD;  Location: [place] [facility];   Service: Cardiovascular;;  . PERIPHERAL VASCULAR CATHETERIZATION N/A 12/16/2016   Procedure: IVC Filter Removal;  Surgeon: Katha Cabal, MD;  Location: [place] [facility];  Service: Cardiovascular;  Laterality: N/A;  . SENTINEL NODE BIOPSY Right 04/01/2018   Procedure: SENTINEL NODE BIOPSY;  Surgeon: Herbert Pun, MD;  Location: [facility] ORS;  Service: General;  Laterality: Right;  . TONSILLECTOMY      Home Medications:  Allergies as of 06/11/2018      Reactions   Levaquin [levofloxacin In D5w] Hives   Macrolides And Ketolides    WHELPS   Penicillins Itching   Can take amoxicillin  Has patient had a PCN reaction causing immediate rash, facial/tongue/throat swelling, SOB or lightheadedness with hypotension:No Has patient had a PCN reaction causing severe rash involving mucus membranes or skin necrosis:unsure Has patient had a PCN reaction that required hospitalization:No Has patient had a PCN reaction occurring within the last 10 years: No If all of the above answers are "NO", then may proceed with Cephalosporin use.   Sulfa Antibiotics Hives      Medication List        Accurate as of 06/11/18 11:06 AM. Always use your most recent med list.          acetaminophen 500 MG tablet Commonly known as:  TYLENOL Take 500 mg by mouth every 6 (six) hours as needed for moderate pain or headache.   diphenhydrAMINE 25 MG tablet Commonly known as:  BENADRYL Take 25 mg by mouth at bedtime as needed for sleep.   hydrochlorothiazide 25 MG tablet Commonly known as:  HYDRODIURIL Take 25 mg by mouth daily.   levothyroxine 100 MCG tablet Commonly known as:  SYNTHROID, LEVOTHROID Take 1 tablet (100 mcg total) by mouth daily before breakfast.   meloxicam 7.5 MG tablet Commonly known as:  MOBIC Take 7.5 mg by mouth daily as needed for pain.   omeprazole 20 MG capsule Commonly known as:  PRILOSEC Take 20 mg by mouth daily as needed (acid reflux).   phenylephrine 10 MG Tabs  tablet Commonly known as:  SUDAFED PE Take 10 mg by mouth every 6 (six) hours as needed (congestion).   risperiDONE 1 MG tablet Commonly known as:  RISPERDAL Take 1 mg by mouth at bedtime.   rivaroxaban 20 MG Tabs tablet Commonly known as:  XARELTO Take 20 mg by mouth daily.   simvastatin 20 MG tablet Commonly known as:  ZOCOR Take 20 mg by mouth every evening.   VITAMIN D PO Take 1 tablet by mouth once a week.       Allergies:  Allergies  Allergen Reactions  . Levaquin [Levofloxacin In D5w] Hives  . Macrolides And Ketolides     WHELPS  . Penicillins Itching    Can take amoxicillin  Has patient had a PCN reaction causing immediate rash, facial/tongue/throat swelling, SOB or lightheadedness with hypotension:No Has patient had a PCN reaction causing severe rash involving mucus membranes or skin necrosis:unsure Has patient had a PCN reaction that required hospitalization:No Has patient had a PCN reaction occurring within the last 10 years: No If all of the above answers are "NO", then  may proceed with Cephalosporin use.  . Sulfa Antibiotics Hives    Family History: Family History  Problem Relation Age of Onset  . Breast cancer Neg Hx   . Prostate cancer Neg Hx   . Kidney cancer Neg Hx     Social History:  reports that she has never smoked. She has never used smokeless tobacco. She reports that she does not drink alcohol or use drugs.  ROS: UROLOGY Frequent Urination?: No Hard to postpone urination?: No Burning/pain with urination?: No Get up at night to urinate?: No Leakage of urine?: No Urine stream starts and stops?: No Trouble starting stream?: No Do you have to strain to urinate?: No Blood in urine?: No Urinary tract infection?: No Sexually transmitted disease?: No Injury to kidneys or bladder?: No Painful intercourse?: No Weak stream?: No Currently pregnant?: No Vaginal bleeding?: No Last menstrual period?: n  Gastrointestinal Nausea?:  No Vomiting?: No Indigestion/heartburn?: No Diarrhea?: No Constipation?: No  Constitutional Fever: No Night sweats?: No Weight loss?: No Fatigue?: No  Skin Skin rash/lesions?: No Itching?: No  Eyes Blurred vision?: No Double vision?: No  Ears/Nose/Throat Sore throat?: No Sinus problems?: No  Hematologic/Lymphatic Swollen glands?: No Easy bruising?: No  Cardiovascular Leg swelling?: No Chest pain?: No  Respiratory Cough?: No Shortness of breath?: No  Endocrine Excessive thirst?: No  Musculoskeletal Back pain?: No Joint pain?: No  Neurological Headaches?: No Dizziness?: No  Psychologic Depression?: No Anxiety?: No  Physical Exam: BP 135/81   Pulse 91   Ht 5\' 2"  (1.575 m)   Wt 207 lb (93.9 kg)   BMI 37.86 kg/m   Constitutional:  Alert and oriented, No acute distress.  Accompanied by husband today. HEENT: [place] AT, moist mucus membranes.  Trachea midline, no masses. Cardiovascular: No clubbing, cyanosis, or edema. Respiratory: Normal respiratory effort, no increased work of breathing. GI: Abdomen is soft, nontender, nondistended, no abdominal masses GU: No CVA tenderness Skin: Erythematous radiation burn to right chest/neck. Neurologic: Grossly intact, no focal deficits, moving all 4 extremities. Psychiatric: Normal mood and affect.  Laboratory Data: Lab Results  Component Value Date   WBC 5.5 06/01/2018   HGB 14.6 06/01/2018   HCT 42.8 06/01/2018   MCV 92.8 06/01/2018   PLT 176 06/01/2018    Lab Results  Component Value Date   CREATININE 0.88 08/06/2016     Urinalysis See care everywhere  Pertinent Imaging: Results for orders placed during the hospital encounter of 05/31/18  Abdomen 1 view (KUB)   Narrative CLINICAL DATA:  Nephrolithiasis.  EXAM: ABDOMEN - 1 VIEW  COMPARISON:  CT scan of the abdomen dated 12/19/2016 and KUB dated 03/08/2012  FINDINGS: There are no visible definitive renal or ureteral calculi. Phlebolith to  the left of the spine at the L5 level, unchanged.  IVC filter in place, unchanged.  The bowel gas pattern is normal.  Bones appear normal.  IMPRESSION: Benign-appearing abdomen.  Specifically, no visible renal calculi.   Electronically Signed   By: Lorriane Shire M.D.   On: 05/31/2018 09:23    KUB personally reviewed today, no evidence of stones.  Assessment & Plan:    1. Microscopic hematuria Personal history of gross hematuria, felt to be related to stone episode in the setting of anticoagulation Previously recommended cystoscopy the patient declined UA from 03/2018 with microscopic blood although may have been contaminated I strongly recommended cystoscopy to rule out any bladder pathology given her complex GU history and questionable ongoing microscopic materia She has declined this today  and states that she has a lot going on with treatment of her breast cancer-she understands the risks and benefits She will return if she changes her mind about this  2. Recurrent UTI No recent urinary tract infections We discussed that medications for breast cancer can lead to vaginal dryness and increased frequency of infections Recommend cranberry tabs and probiotic as prophylaxis She will come to our office if she develops any UTI symptoms for UA/urine culture  3. Recurrent nephrolithiasis Personal history of recurrent nephrolithiasis Most recent KUB reviewed, no obvious stone burden Briefly reviewed stone diet today   F/u prn (recommended cystoscopy but declined)   Hollice Espy, MD  [place] [address] [place], [place] [address] [phone]

## 2018-06-14 ENCOUNTER — Ambulatory Visit
Admission: RE | Admit: 2018-06-14 | Discharge: 2018-06-14 | Disposition: A | Discharge status: Home / Self Care | Payer: PPO | Source: Ambulatory Visit | Attending: Radiation Oncology | Admitting: Radiation Oncology

## 2018-06-14 DIAGNOSIS — C50411 Malignant neoplasm of upper-outer quadrant of right female breast: Secondary | ICD-10-CM | POA: Diagnosis not present

## 2018-06-14 DIAGNOSIS — Z170 Estrogen receptor positive status [ER+]: Secondary | ICD-10-CM | POA: Diagnosis not present

## 2018-06-14 DIAGNOSIS — Z510 Encounter for antineoplastic radiation therapy: Secondary | ICD-10-CM | POA: Diagnosis not present

## 2018-06-15 ENCOUNTER — Ambulatory Visit
Admission: RE | Admit: 2018-06-15 | Discharge: 2018-06-15 | Disposition: A | Discharge status: Home / Self Care | Payer: PPO | Source: Ambulatory Visit | Attending: Radiation Oncology | Admitting: Radiation Oncology

## 2018-06-15 ENCOUNTER — Inpatient Hospital Stay: Payer: PPO

## 2018-06-15 ENCOUNTER — Other Ambulatory Visit: Payer: Self-pay

## 2018-06-15 DIAGNOSIS — Z510 Encounter for antineoplastic radiation therapy: Secondary | ICD-10-CM | POA: Diagnosis not present

## 2018-06-15 DIAGNOSIS — Z170 Estrogen receptor positive status [ER+]: Secondary | ICD-10-CM | POA: Diagnosis not present

## 2018-06-15 DIAGNOSIS — C50411 Malignant neoplasm of upper-outer quadrant of right female breast: Secondary | ICD-10-CM

## 2018-06-15 LAB — CBC
HCT: 42.7 % (ref 35.0–47.0)
Hemoglobin: 14.5 g/dL (ref 12.0–16.0)
MCH: 31.7 pg (ref 26.0–34.0)
MCHC: 33.9 g/dL (ref 32.0–36.0)
MCV: 93.7 fL (ref 80.0–100.0)
PLATELETS: 168 10*3/uL (ref 150–440)
RBC: 4.55 MIL/uL (ref 3.80–5.20)
RDW: 14.7 % — ABNORMAL HIGH (ref 11.5–14.5)
WBC: 5.5 10*3/uL (ref 3.6–11.0)

## 2018-06-16 ENCOUNTER — Ambulatory Visit
Admission: RE | Admit: 2018-06-16 | Discharge: 2018-06-16 | Disposition: A | Discharge status: Home / Self Care | Payer: PPO | Source: Ambulatory Visit | Attending: Radiation Oncology | Admitting: Radiation Oncology

## 2018-06-16 DIAGNOSIS — C50411 Malignant neoplasm of upper-outer quadrant of right female breast: Secondary | ICD-10-CM | POA: Diagnosis not present

## 2018-06-16 DIAGNOSIS — Z510 Encounter for antineoplastic radiation therapy: Secondary | ICD-10-CM | POA: Diagnosis not present

## 2018-06-16 DIAGNOSIS — Z170 Estrogen receptor positive status [ER+]: Secondary | ICD-10-CM | POA: Diagnosis not present

## 2018-06-17 ENCOUNTER — Ambulatory Visit
Admission: RE | Admit: 2018-06-17 | Discharge: 2018-06-17 | Disposition: A | Discharge status: Home / Self Care | Payer: PPO | Source: Ambulatory Visit | Attending: Radiation Oncology | Admitting: Radiation Oncology

## 2018-06-17 DIAGNOSIS — Z510 Encounter for antineoplastic radiation therapy: Secondary | ICD-10-CM | POA: Diagnosis not present

## 2018-06-17 DIAGNOSIS — C50411 Malignant neoplasm of upper-outer quadrant of right female breast: Secondary | ICD-10-CM | POA: Diagnosis not present

## 2018-06-17 DIAGNOSIS — Z170 Estrogen receptor positive status [ER+]: Secondary | ICD-10-CM | POA: Diagnosis not present

## 2018-06-18 ENCOUNTER — Ambulatory Visit
Admission: RE | Admit: 2018-06-18 | Discharge: 2018-06-18 | Disposition: A | Discharge status: Home / Self Care | Payer: PPO | Source: Ambulatory Visit | Attending: Radiation Oncology | Admitting: Radiation Oncology

## 2018-06-18 DIAGNOSIS — Z510 Encounter for antineoplastic radiation therapy: Secondary | ICD-10-CM | POA: Diagnosis not present

## 2018-06-18 DIAGNOSIS — C50411 Malignant neoplasm of upper-outer quadrant of right female breast: Secondary | ICD-10-CM | POA: Diagnosis not present

## 2018-06-18 DIAGNOSIS — Z170 Estrogen receptor positive status [ER+]: Secondary | ICD-10-CM | POA: Diagnosis not present

## 2018-06-21 ENCOUNTER — Ambulatory Visit
Admission: RE | Admit: 2018-06-21 | Discharge: 2018-06-21 | Disposition: A | Discharge status: Home / Self Care | Payer: PPO | Source: Ambulatory Visit | Attending: Radiation Oncology | Admitting: Radiation Oncology

## 2018-06-21 DIAGNOSIS — Z510 Encounter for antineoplastic radiation therapy: Secondary | ICD-10-CM | POA: Diagnosis not present

## 2018-06-21 DIAGNOSIS — Z170 Estrogen receptor positive status [ER+]: Secondary | ICD-10-CM | POA: Diagnosis not present

## 2018-06-21 DIAGNOSIS — C50411 Malignant neoplasm of upper-outer quadrant of right female breast: Secondary | ICD-10-CM | POA: Diagnosis not present

## 2018-06-22 ENCOUNTER — Ambulatory Visit: Payer: PPO

## 2018-06-22 ENCOUNTER — Ambulatory Visit
Admission: RE | Admit: 2018-06-22 | Discharge: 2018-06-22 | Disposition: A | Discharge status: Home / Self Care | Payer: PPO | Source: Ambulatory Visit | Attending: Radiation Oncology | Admitting: Radiation Oncology

## 2018-06-22 DIAGNOSIS — C50411 Malignant neoplasm of upper-outer quadrant of right female breast: Secondary | ICD-10-CM | POA: Diagnosis not present

## 2018-06-22 DIAGNOSIS — Z510 Encounter for antineoplastic radiation therapy: Secondary | ICD-10-CM | POA: Diagnosis not present

## 2018-06-22 DIAGNOSIS — Z170 Estrogen receptor positive status [ER+]: Secondary | ICD-10-CM | POA: Diagnosis not present

## 2018-06-23 ENCOUNTER — Ambulatory Visit
Admission: RE | Admit: 2018-06-23 | Discharge: 2018-06-23 | Disposition: A | Discharge status: Home / Self Care | Payer: PPO | Source: Ambulatory Visit | Attending: Radiation Oncology | Admitting: Radiation Oncology

## 2018-06-23 DIAGNOSIS — Z510 Encounter for antineoplastic radiation therapy: Secondary | ICD-10-CM | POA: Diagnosis not present

## 2018-07-05 NOTE — Progress Notes (Signed)
[place]  [phone] Fax:([phone]  ID: Susan Myers OB: 09-24-1951  MR#: [id number]  GGE#:[id number]  Patient Care Team: Idelle Crouch, MD as PCP - General (Internal Medicine)  CHIEF COMPLAINT: Clinical stage IA ER/PR positive, HER-2 negative invasive carcinoma of the upper outer quadrant of the right breast.  INTERVAL HISTORY: Patient returns to clinic today for further evaluation and initiation of aromatase inhibitor.  She recently finished her XRT and tolerated it well without significant side effects.  She has no neurologic complaints.  She denies any recent fevers or illnesses.  She has a good appetite and denies weight loss.  She has no chest pain or shortness of breath.  She denies any nausea, vomiting, constipation, or diarrhea.  She has no urinary complaints.  Patient feels at her baseline offers no specific complaints today.  REVIEW OF SYSTEMS:   Review of Systems  Constitutional: Negative.  Negative for fever, malaise/fatigue and weight loss.  Respiratory: Negative.  Negative for cough and shortness of breath.   Cardiovascular: Negative.  Negative for chest pain and leg swelling.  Gastrointestinal: Negative.  Negative for abdominal pain.  Genitourinary: Negative.  Negative for dysuria.  Musculoskeletal: Negative.  Negative for back pain.  Skin: Negative.  Negative for rash.  Neurological: Negative.  Negative for sensory change, focal weakness and weakness.  Psychiatric/Behavioral: Negative.  The patient is not nervous/anxious.     As per HPI. Otherwise, a complete review of systems is negative.  PAST MEDICAL HISTORY: Past Medical History:  Diagnosis Date  . Breast cancer ([place]) 2019   invasive mammary carcinoma  . Complication of anesthesia   . Delusional disorder ([place])   . DVT (deep venous thrombosis) ([place]) 06/2016  . GERD (gastroesophageal reflux disease)   . History of kidney stones   . Hx of blood clots   .  Hypercholesterolemia   . Hypothyroidism   . Kidney stones   . Personal history of radiation therapy 2019   right breast cancer  . PONV (postoperative nausea and vomiting)   . Thyroid disease     PAST SURGICAL HISTORY: Past Surgical History:  Procedure Laterality Date  . ABDOMINAL HYSTERECTOMY    . APPENDECTOMY    . BREAST BIOPSY Right 03/23/2018   Affirm Bx- invasive mammary carcinoma  . BREAST CYST ASPIRATION Bilateral 1990  . BREAST LUMPECTOMY Right 2019   invasive mammary carcinoma  . COLONOSCOPY    . IVC FILTER INSERTION    . PARTIAL MASTECTOMY WITH NEEDLE LOCALIZATION Right 04/01/2018   Procedure: PARTIAL MASTECTOMY WITH NEEDLE LOCALIZATION;  Surgeon: Herbert Pun, MD;  Location: [facility] ORS;  Service: General;  Laterality: Right;  . PERIPHERAL VASCULAR CATHETERIZATION N/A 08/06/2016   Procedure: IVC Filter Insertion;  Surgeon: Katha Cabal, MD;  Location: [place] [facility];  Service: Cardiovascular;  Laterality: N/A;  . PERIPHERAL VASCULAR CATHETERIZATION Left 08/06/2016   Procedure: Lower Extremity Venography;  Surgeon: Katha Cabal, MD;  Location: [place] [facility];  Service: Cardiovascular;  Laterality: Left;  . PERIPHERAL VASCULAR CATHETERIZATION  08/06/2016   Procedure: Lower Extremity Intervention;  Surgeon: Katha Cabal, MD;  Location: [place] [facility];  Service: Cardiovascular;;  . PERIPHERAL VASCULAR CATHETERIZATION N/A 12/16/2016   Procedure: IVC Filter Removal;  Surgeon: Katha Cabal, MD;  Location: [place] [facility];  Service: Cardiovascular;  Laterality: N/A;  . SENTINEL NODE BIOPSY Right 04/01/2018   Procedure: SENTINEL NODE BIOPSY;  Surgeon: Herbert Pun, MD;  Location: [facility] ORS;  Service: General;  Laterality: Right;  . TONSILLECTOMY      FAMILY HISTORY: Family History  Problem Relation Age of Onset  . Breast cancer Neg Hx   . Prostate cancer Neg Hx   . Kidney cancer Neg Hx     ADVANCED DIRECTIVES (Y/N):   N  HEALTH MAINTENANCE: Social History   Tobacco Use  . Smoking status: Never Smoker  . Smokeless tobacco: Never Used  Substance Use Topics  . Alcohol use: No  . Drug use: No     Colonoscopy:  PAP:  Bone density:  Lipid panel:  Allergies  Allergen Reactions  . Levaquin [Levofloxacin In D5w] Hives  . Macrolides And Ketolides     WHELPS  . Penicillins Itching    Can take amoxicillin  Has patient had a PCN reaction causing immediate rash, facial/tongue/throat swelling, SOB or lightheadedness with hypotension:No Has patient had a PCN reaction causing severe rash involving mucus membranes or skin necrosis:unsure Has patient had a PCN reaction that required hospitalization:No Has patient had a PCN reaction occurring within the last 10 years: No If all of the above answers are "NO", then may proceed with Cephalosporin use.  . Sulfa Antibiotics Hives    Current Outpatient Medications  Medication Sig Dispense Refill  . acetaminophen (TYLENOL) 500 MG tablet Take 500 mg by mouth every 6 (six) hours as needed for moderate pain or headache.    . Cholecalciferol (VITAMIN D PO) Take 1 tablet by mouth once a week.    . diphenhydrAMINE (BENADRYL) 25 MG tablet Take 25 mg by mouth at bedtime as needed for sleep.    . hydrochlorothiazide (HYDRODIURIL) 25 MG tablet Take 25 mg by mouth daily.    Marland Kitchen levothyroxine (SYNTHROID, LEVOTHROID) 100 MCG tablet Take 1 tablet (100 mcg total) by mouth daily before breakfast. 30 tablet 0  . meloxicam (MOBIC) 7.5 MG tablet Take 7.5 mg by mouth daily as needed for pain.    Marland Kitchen omeprazole (PRILOSEC) 20 MG capsule Take 20 mg by mouth daily as needed (acid reflux).     . phenylephrine (SUDAFED PE) 10 MG TABS tablet Take 10 mg by mouth every 6 (six) hours as needed (congestion).    . risperiDONE (RISPERDAL) 1 MG tablet Take 1 mg by mouth at bedtime.    . rivaroxaban (XARELTO) 20 MG TABS tablet Take 20 mg by mouth daily.    . simvastatin (ZOCOR) 20 MG tablet Take 20  mg by mouth every evening.     Marland Kitchen letrozole (FEMARA) 2.5 MG tablet Take 1 tablet (2.5 mg total) by mouth daily. 90 tablet 1   No current facility-administered medications for this visit.     OBJECTIVE: Vitals:   07/08/18 1005 07/08/18 1009  BP:  124/81  Pulse:  96  Resp: 12   Temp:  97.9 F (36.6 C)     Body mass index is 38.67 kg/m.    ECOG FS:0 - Asymptomatic  General: Well-developed, well-nourished, no acute distress. Eyes: Pink conjunctiva, anicteric sclera. HEENT: Normocephalic, moist mucous membranes. Breast: Exam deferred today. Lungs: Clear to auscultation bilaterally. Heart: Regular rate and rhythm. No rubs, murmurs, or gallops. Abdomen: Soft, nontender, nondistended. No organomegaly noted, normoactive bowel sounds. Musculoskeletal: No edema, cyanosis, or clubbing. Neuro: Alert, answering all questions appropriately. Cranial nerves grossly intact. Skin: No rashes or petechiae noted. Psych: Normal affect.   LAB RESULTS:  Lab Results  Component Value Date   NA 143 11/23/2015   K 3.4 (L) 11/23/2015   CL 105  11/23/2015   CO2 28 11/23/2015   GLUCOSE 101 (H) 11/23/2015   BUN 23 (H) 08/06/2016   CREATININE 0.88 08/06/2016   CALCIUM 9.7 11/23/2015   PROT 7.5 11/23/2015   ALBUMIN 4.8 11/23/2015   AST 22 11/23/2015   ALT 24 11/23/2015   ALKPHOS 88 11/23/2015   BILITOT 0.8 11/23/2015   GFRNONAA >60 08/06/2016   GFRAA >60 08/06/2016    Lab Results  Component Value Date   WBC 5.5 06/15/2018   HGB 14.5 06/15/2018   HCT 42.7 06/15/2018   MCV 93.7 06/15/2018   PLT 168 06/15/2018     STUDIES: Dg Bone Density  Result Date: 07/12/2018 EXAM: DUAL X-RAY ABSORPTIOMETRY (DXA) FOR BONE MINERAL DENSITY IMPRESSION: Dear Dr. Grayland Ormond, Your patient BRITTON PERKINSON completed a FRAX assessment on 07/12/2018 using the [place] (analysis version: 14.10) manufactured by [employer]. The following summarizes the results of our evaluation. PATIENT BIOGRAPHICAL:  Name: Amayrany, Cafaro Patient ID: [id number] Birth Date: August 09, 1951 Height:    62.5 in. Gender:     Female    Age:        42.8       Weight:    208.0 lbs. Ethnicity:  White                            Exam Date: 07/12/2018 FRAX* RESULTS:  (version: 3.5) 10-year Probability of Fracture1 Major Osteoporotic Fracture2 Hip Fracture 15.0% 2.0% Population: [place] (Caucasian) Risk Factors: History of Fracture (Adult) Based on Femur (Right) Neck BMD 1 -The 10-year probability of fracture may be lower than reported if the patient has received treatment. 2 -Major Osteoporotic Fracture: Clinical Spine, Forearm, Hip or Shoulder *FRAX is a Materials engineer of the [employer] for Metabolic Bone Disease, a [place] (WHO) [facility]. ASSESSMENT: The probability of a major osteoporotic fracture is 15.0% within the next ten years. The probability of a hip fracture is 2.0% within the next ten years. . Dear Dr. Grayland Ormond, Your patient Merna Baldi completed a BMD test on 07/12/2018 using the [place] (analysis version: 14.10) manufactured by [employer]. The following summarizes the results of our evaluation. PATIENT BIOGRAPHICAL: Name: Vicki, Chaffin Patient ID: [id number] Birth Date: Nov 17, 1951 Height: 62.5 in. Gender: Female Exam Date: 07/12/2018 Weight: 208.0 lbs. Indications: Breast CA, Caucasian, High Risk Meds, History of Fracture (Adult), History of Radiation, Hypothyroid, Hysterectomy, Oophorectomy Bilateral, Postmenopausal Fractures: Left foot Treatments: Femara, Levothyroxine, Omeprazole ASSESSMENT: The BMD measured at AP Spine L1-L4 (L2,L3) is 0.942 g/cm2 with a T-score of -1.9. This patient is considered osteopenic according to [facility]) criteria. L2 and L3 were excluded due to previous filter placement. Site Region Measured Measured WHO Young Adult BMD Date       Age      Classification T-score AP Spine L1-L4 (L2,L3)  07[age]12/2018 67.8 Osteopenia -1.9 0.942 g/cm2 DualFemur Neck Right 07/12/2018 67.8 Osteopenia -1.7 0.801 g/cm2 World Health Organization [facility]) criteria for post-menopausal, Caucasian Women: Normal:       T-score at or above -1 SD Osteopenia:   T-score between -1 and -2.5 SD Osteoporosis: T-score at or below -2.5 SD RECOMMENDATIONS: 1. All patients should optimize calcium and vitamin D intake. 2. Consider FDA-approved medical therapies in postmenopausal women and men aged 37 years and older, based on the following: a. A hip or vertebral(clinical or morphometric) fracture b. T-score < -2.5 at the femoral neck  or spine after appropriate evaluation to exclude secondary causes c. Low bone mass (T-score between -1.0 and -2.5 at the femoral neck or spine) and a 10-year probability of a hip fracture > 3% or a 10-year probability of a major osteoporosis-related fracture > 20% based on the [place]-adapted WHO algorithm d. Clinician judgment and/or patient preferences may indicate treatment for people with 10-year fracture probabilities above or below these levels FOLLOW-UP: People with diagnosed cases of osteoporosis or at high risk for fracture should have regular bone mineral density tests. For patients eligible for Medicare, routine testing is allowed once every 2 years. The testing frequency can be increased to one year for patients who have rapidly progressing disease, those who are receiving or discontinuing medical therapy to restore bone mass, or have additional risk factors. I have reviewed this report, and agree with the above findings. [facility] Radiology Electronically Signed   By: Rolm Baptise M.D.   On: 07/12/2018 09:18    ASSESSMENT: Clinical stage IA ER/PR positive, HER-2 negative invasive carcinoma of the upper outer quadrant of the right breast.  Oncotype DX score 6, low risk.  PLAN:    1. Clinical stage IA ER/PR positive, HER-2 negative invasive carcinoma of the upper outer quadrant of the right breast:  Patient underwent lumpectomy on Apr 01, 2018 confirming the staging of disease.  She had an Oncotype DX score of 6 which confers a low risk therefore patient did not require adjuvant chemotherapy.  She has now completed her adjuvant XRT and was given a prescription for letrozole which she will take for a total of 5 years completing in August 2024.  Return to clinic in 3 months for routine evaluation. 2.  Osteopenia: Patient had a baseline bone marrow density on July 12, 2018 that revealed a T score of -1.9.  Continue calcium and vitamin D supplementation.  Repeat in August 2020.    I spent a total of 30 minutes face-to-face with the patient of which greater than 50% of the visit was spent in counseling and coordination of care as detailed above.     Patient expressed understanding and was in agreement with this plan. She also understands that She can call clinic at any time with any questions, concerns, or complaints.   Cancer Staging Primary cancer of upper outer quadrant of right female breast [facility]) Staging form: Breast, AJCC 8th Edition - Clinical stage from 03/26/2018: Stage IA (cT1b, cN0, cM0, G2, ER+, PR+, HER2-) - Signed by Lloyd Huger, MD on 03/26/2018   Lloyd Huger, MD   07/12/2018 12:46 PM

## 2018-07-08 ENCOUNTER — Other Ambulatory Visit: Payer: Self-pay

## 2018-07-08 ENCOUNTER — Encounter: Payer: Self-pay | Admitting: Oncology

## 2018-07-08 ENCOUNTER — Inpatient Hospital Stay: Payer: PPO | Attending: Oncology | Admitting: Oncology

## 2018-07-08 VITALS — BP 124/81 | HR 96 | Temp 97.9°F | Resp 12 | Ht 62.0 in | Wt 211.4 lb

## 2018-07-08 DIAGNOSIS — C50411 Malignant neoplasm of upper-outer quadrant of right female breast: Secondary | ICD-10-CM | POA: Insufficient documentation

## 2018-07-08 DIAGNOSIS — M8580 Other specified disorders of bone density and structure, unspecified site: Secondary | ICD-10-CM | POA: Diagnosis not present

## 2018-07-08 DIAGNOSIS — Z79811 Long term (current) use of aromatase inhibitors: Secondary | ICD-10-CM | POA: Diagnosis not present

## 2018-07-08 MED ORDER — LETROZOLE 2.5 MG PO TABS: 2.5000 mg | ORAL_TABLET | Freq: Every day | ORAL | 1 refills | Status: DC

## 2018-07-08 NOTE — Progress Notes (Signed)
Patient here for follow up. No changes since last appointment.  

## 2018-07-12 ENCOUNTER — Ambulatory Visit
Admission: RE | Admit: 2018-07-12 | Discharge: 2018-07-12 | Disposition: A | Discharge status: Home / Self Care | Payer: PPO | Source: Ambulatory Visit | Attending: Oncology | Admitting: Oncology

## 2018-07-12 DIAGNOSIS — Z79811 Long term (current) use of aromatase inhibitors: Secondary | ICD-10-CM | POA: Diagnosis not present

## 2018-07-12 DIAGNOSIS — M85851 Other specified disorders of bone density and structure, right thigh: Secondary | ICD-10-CM | POA: Insufficient documentation

## 2018-07-12 DIAGNOSIS — M8588 Other specified disorders of bone density and structure, other site: Secondary | ICD-10-CM | POA: Insufficient documentation

## 2018-07-12 DIAGNOSIS — Z780 Asymptomatic menopausal state: Secondary | ICD-10-CM | POA: Diagnosis not present

## 2018-07-12 DIAGNOSIS — C50411 Malignant neoplasm of upper-outer quadrant of right female breast: Secondary | ICD-10-CM | POA: Diagnosis not present

## 2018-07-12 DIAGNOSIS — Z13820 Encounter for screening for osteoporosis: Secondary | ICD-10-CM | POA: Insufficient documentation

## 2018-07-12 HISTORY — DX: Malignant neoplasm of unspecified site of unspecified female breast: C50.919

## 2018-07-12 HISTORY — DX: Personal history of irradiation: Z92.3

## 2018-07-16 DIAGNOSIS — E039 Hypothyroidism, unspecified: Secondary | ICD-10-CM | POA: Diagnosis not present

## 2018-07-22 ENCOUNTER — Encounter: Payer: Self-pay | Admitting: Oncology

## 2018-07-23 DIAGNOSIS — I10 Essential (primary) hypertension: Secondary | ICD-10-CM | POA: Diagnosis not present

## 2018-07-23 DIAGNOSIS — J9811 Atelectasis: Secondary | ICD-10-CM | POA: Diagnosis not present

## 2018-07-23 DIAGNOSIS — E7800 Pure hypercholesterolemia, unspecified: Secondary | ICD-10-CM | POA: Diagnosis not present

## 2018-07-23 DIAGNOSIS — Z79899 Other long term (current) drug therapy: Secondary | ICD-10-CM | POA: Diagnosis not present

## 2018-07-23 DIAGNOSIS — E039 Hypothyroidism, unspecified: Secondary | ICD-10-CM | POA: Diagnosis not present

## 2018-07-23 DIAGNOSIS — C50919 Malignant neoplasm of unspecified site of unspecified female breast: Secondary | ICD-10-CM | POA: Diagnosis not present

## 2018-08-02 ENCOUNTER — Ambulatory Visit
Admission: RE | Admit: 2018-08-02 | Discharge: 2018-08-02 | Disposition: A | Discharge status: Home / Self Care | Payer: PPO | Source: Ambulatory Visit | Attending: Radiation Oncology | Admitting: Radiation Oncology

## 2018-08-02 ENCOUNTER — Encounter: Payer: Self-pay | Admitting: Radiation Oncology

## 2018-08-02 ENCOUNTER — Other Ambulatory Visit: Payer: Self-pay

## 2018-08-02 VITALS — BP 132/83 | HR 93 | Temp 97.3°F | Resp 16 | Wt 208.2 lb

## 2018-08-02 DIAGNOSIS — Z923 Personal history of irradiation: Secondary | ICD-10-CM | POA: Diagnosis not present

## 2018-08-02 DIAGNOSIS — Z79811 Long term (current) use of aromatase inhibitors: Secondary | ICD-10-CM | POA: Insufficient documentation

## 2018-08-02 DIAGNOSIS — Z170 Estrogen receptor positive status [ER+]: Secondary | ICD-10-CM | POA: Insufficient documentation

## 2018-08-02 DIAGNOSIS — C50911 Malignant neoplasm of unspecified site of right female breast: Secondary | ICD-10-CM | POA: Diagnosis not present

## 2018-08-02 DIAGNOSIS — C50411 Malignant neoplasm of upper-outer quadrant of right female breast: Secondary | ICD-10-CM

## 2018-08-02 NOTE — Progress Notes (Signed)
Radiation Oncology Follow up Note  Name: Susan Myers   Date:   08/02/2018 MRN:  [id number] DOB: 12/14/1950    This 67 y.o. female presents to the clinic today for one-month follow-up status post whole breast radiation to the right breast.for stage I ER/PR positive HER-2/neu negative invasive mammary carcinoma  REFERRING PROVIDER: Idelle Crouch, MD  HPI: patient is a 67 year old female now seen out 1 month having completed whole breast radiation to her right breast for ER/PR positive invasive mammary carcinoma stage I. Seen today in routine follow-up she is doing well. She specifically denies breast tenderness cough or bone pain..she is currently on Femara tolerating that well without side effect.  COMPLICATIONS OF TREATMENT: none  FOLLOW UP COMPLIANCE: keeps appointments   PHYSICAL EXAM:  BP 132/83 (BP Location: Left Arm, Patient Position: Sitting)   Pulse 93   Temp (!) 97.3 F (36.3 C) (Tympanic)   Resp 16   Wt 208 lb 3.6 oz (94.4 kg)   BMI 38.08 kg/m  Lungs are clear to A&P cardiac examination essentially unremarkable with regular rate and rhythm. No dominant mass or nodularity is noted in either breast in 2 positions examined. Incision is well-healed. No axillary or supraclavicular adenopathy is appreciated. Cosmetic result is excellent.Well-developed well-nourished patient in NAD. HEENT reveals PERLA, EOMI, discs not visualized.  Oral cavity is clear. No oral mucosal lesions are identified. Neck is clear without evidence of cervical or supraclavicular adenopathy. Lungs are clear to A&P. Cardiac examination is essentially unremarkable with regular rate and rhythm without murmur rub or thrill. Abdomen is benign with no organomegaly or masses noted. Motor sensory and DTR levels are equal and symmetric in the upper and lower extremities. Cranial nerves II through XII are grossly intact. Proprioception is intact. No peripheral adenopathy or edema is identified. No motor or sensory  levels are noted. Crude visual fields are within normal range.  RADIOLOGY RESULTS: no current films for review  PLAN: present time patient is doing well with no evidence of disease. I am please were overall progress. She has started Femara this time that well. I have asked to see her back in 4-5 months for follow-up. Patient is to call sooner with any concerns.  I would like to take this opportunity to thank you for allowing me to participate in the care of your patient.[name], MD

## 2018-09-08 DIAGNOSIS — I781 Nevus, non-neoplastic: Secondary | ICD-10-CM | POA: Diagnosis not present

## 2018-09-14 DIAGNOSIS — Z853 Personal history of malignant neoplasm of breast: Secondary | ICD-10-CM | POA: Diagnosis not present

## 2018-10-09 NOTE — Progress Notes (Signed)
[facility]  [phone] [phone]) [phone]  ID: Susan Myers OB: 25-Mar-1951  MR#: [id number]  EKC#:[id number]  Patient Care Team: Idelle Crouch, MD as PCP - General (Internal Medicine)  CHIEF COMPLAINT: Clinical stage IA ER/PR positive, HER-2 negative invasive carcinoma of the upper outer quadrant of the right breast.  INTERVAL HISTORY: Patient returns to clinic today for routine 24-monthevaluation and to assess her toleration of letrozole.  She is tolerating treatment well without significant side effects.  She currently feels well and is asymptomatic. She has no neurologic complaints.  She denies any recent fevers or illnesses.  She has a good appetite and denies weight loss.  She has no chest pain or shortness of breath.  She denies any nausea, vomiting, constipation, or diarrhea.  She has no urinary complaints.  Patient feels at her baseline offers no specific complaints today.  REVIEW OF SYSTEMS:   Review of Systems  Constitutional: Negative.  Negative for fever, malaise/fatigue and weight loss.  Respiratory: Negative.  Negative for cough and shortness of breath.   Cardiovascular: Negative.  Negative for chest pain and leg swelling.  Gastrointestinal: Negative.  Negative for abdominal pain.  Genitourinary: Negative.  Negative for dysuria.  Musculoskeletal: Negative.  Negative for back pain.  Skin: Negative.  Negative for rash.  Neurological: Negative.  Negative for sensory change, focal weakness and weakness.  Psychiatric/Behavioral: Negative.  The patient is not nervous/anxious.     As per HPI. Otherwise, a complete review of systems is negative.  PAST MEDICAL HISTORY: Past Medical History:  Diagnosis Date  . Breast cancer ([place] 2019   invasive mammary carcinoma  . Complication of anesthesia   . Delusional disorder ([facility]   . DVT (deep venous thrombosis) ([place] 06/2016  . GERD (gastroesophageal reflux disease)   . History of kidney  stones   . Hx of blood clots   . Hypercholesterolemia   . Hypothyroidism   . Kidney stones   . Personal history of radiation therapy 2019   right breast cancer  . PONV (postoperative nausea and vomiting)   . Thyroid disease     PAST SURGICAL HISTORY: Past Surgical History:  Procedure Laterality Date  . ABDOMINAL HYSTERECTOMY    . APPENDECTOMY    . BREAST BIOPSY Right 03/23/2018   Affirm Bx- invasive mammary carcinoma  . BREAST CYST ASPIRATION Bilateral 1990  . BREAST LUMPECTOMY Right 2019   invasive mammary carcinoma  . COLONOSCOPY    . IVC FILTER INSERTION    . PARTIAL MASTECTOMY WITH NEEDLE LOCALIZATION Right 04/01/2018   Procedure: PARTIAL MASTECTOMY WITH NEEDLE LOCALIZATION;  Surgeon: CHerbert Pun MD;  Location: [facility];  Service: General;  Laterality: Right;  . PERIPHERAL VASCULAR CATHETERIZATION N/A 08/06/2016   Procedure: IVC Filter Insertion;  Surgeon: GKatha Cabal MD;  [facility];  Service: Cardiovascular;  Laterality: N/A;  . PERIPHERAL VASCULAR CATHETERIZATION Left 08/06/2016   Procedure: Lower Extremity Venography;  Surgeon: GKatha Cabal MD;  [facility];  Service: Cardiovascular;  Laterality: Left;  . PERIPHERAL VASCULAR CATHETERIZATION  08/06/2016   Procedure: Lower Extremity Intervention;  Surgeon: GKatha Cabal MD;  [facility];  Service: Cardiovascular;;  . PERIPHERAL VASCULAR CATHETERIZATION N/A 12/16/2016   Procedure: IVC Filter Removal;  Surgeon: GKatha Cabal MD;  [facility];  Service: Cardiovascular;  Laterality: N/A;  . SENTINEL NODE BIOPSY Right 04/01/2018   Procedure: SENTINEL NODE BIOPSY;  Surgeon: CHerbert Pun  MD;  Location: [facility];  Service: General;  Laterality: Right;  . TONSILLECTOMY      FAMILY HISTORY: Family History  Problem Relation Age of Onset  . Breast cancer Neg Hx   . Prostate cancer Neg Hx   . Kidney cancer Neg Hx      ADVANCED DIRECTIVES (Y/N):  N  HEALTH MAINTENANCE: Social History   Tobacco Use  . Smoking status: Never Smoker  . Smokeless tobacco: Never Used  Substance Use Topics  . Alcohol use: No  . Drug use: No     Colonoscopy:  PAP:  Bone density:  Lipid panel:  Allergies  Allergen Reactions  . Levaquin [Levofloxacin In D5w] Hives  . Macrolides And Ketolides     WHELPS  . Penicillins Itching    Can take amoxicillin  Has patient had a PCN reaction causing immediate rash, facial/tongue/throat swelling, SOB or lightheadedness with hypotension:No Has patient had a PCN reaction causing severe rash involving mucus membranes or skin necrosis:unsure Has patient had a PCN reaction that required hospitalization:No Has patient had a PCN reaction occurring within the last 10 years: No If all of the above answers are "NO", then may proceed with Cephalosporin use.  . Sulfa Antibiotics Hives    Current Outpatient Medications  Medication Sig Dispense Refill  . acetaminophen (TYLENOL) 500 MG tablet Take 500 mg by mouth every 6 (six) hours as needed for moderate pain or headache.    . Cholecalciferol (VITAMIN D PO) Take 1 tablet by mouth once a week.    . diphenhydrAMINE (BENADRYL) 25 MG tablet Take 25 mg by mouth at bedtime as needed for sleep.    . hydrochlorothiazide (HYDRODIURIL) 25 MG tablet Take 25 mg by mouth daily.    Marland Kitchen letrozole (FEMARA) 2.5 MG tablet Take 1 tablet (2.5 mg total) by mouth daily. 90 tablet 2  . levothyroxine (SYNTHROID, LEVOTHROID) 100 MCG tablet Take 1 tablet (100 mcg total) by mouth daily before breakfast. (Patient taking differently: Take 150 mcg by mouth daily before breakfast. ) 30 tablet 0  . meloxicam (MOBIC) 7.5 MG tablet Take 7.5 mg by mouth daily as needed for pain.    Marland Kitchen omeprazole (PRILOSEC) 20 MG capsule Take 20 mg by mouth daily as needed (acid reflux).     . phenylephrine (SUDAFED PE) 10 MG TABS tablet Take 10 mg by mouth every 6 (six) hours as needed  (congestion).    . risperiDONE (RISPERDAL) 1 MG tablet Take 1 mg by mouth at bedtime.    . rivaroxaban (XARELTO) 20 MG TABS tablet Take 20 mg by mouth daily.    . simvastatin (ZOCOR) 20 MG tablet Take 20 mg by mouth every evening.      No current facility-administered medications for this visit.     OBJECTIVE: Vitals:   10/14/18 1039  BP: 129/83  Pulse: 92  Resp: 18  Temp: 97.8 F (36.6 C)     Body mass index is 37.59 kg/m.    ECOG FS:0 - Asymptomatic  General: Well-developed, well-nourished, no acute distress. Eyes: Pink conjunctiva, anicteric sclera. HEENT: Normocephalic, moist mucous membranes. Breast: Patient declined breast exam today. Lungs: Clear to auscultation bilaterally. Heart: Regular rate and rhythm. No rubs, murmurs, or gallops. Abdomen: Soft, nontender, nondistended. No organomegaly noted, normoactive bowel sounds. Musculoskeletal: No edema, cyanosis, or clubbing. Neuro: Alert, answering all questions appropriately. Cranial nerves grossly intact. Skin: No rashes or petechiae noted. Psych: Normal affect.  LAB RESULTS:  Lab Results  Component Value Date  NA 143 11/23/2015   K 3.4 (L) 11/23/2015   CL 105 11/23/2015   CO2 28 11/23/2015   GLUCOSE 101 (H) 11/23/2015   BUN 23 (H) 08/06/2016   CREATININE 0.88 08/06/2016   CALCIUM 9.7 11/23/2015   PROT 7.5 11/23/2015   ALBUMIN 4.8 11/23/2015   AST 22 11/23/2015   ALT 24 11/23/2015   ALKPHOS 88 11/23/2015   BILITOT 0.8 11/23/2015   GFRNONAA >60 08/06/2016   GFRAA >60 08/06/2016    Lab Results  Component Value Date   WBC 5.5 06/15/2018   HGB 14.5 06/15/2018   HCT 42.7 06/15/2018   MCV 93.7 06/15/2018   PLT 168 06/15/2018     STUDIES: No results found.  ASSESSMENT: Clinical stage IA ER/PR positive, HER-2 negative invasive carcinoma of the upper outer quadrant of the right breast.  Oncotype DX score 6, low risk.  PLAN:    1. Clinical stage IA ER/PR positive, HER-2 negative invasive carcinoma of  the upper outer quadrant of the right breast: Patient underwent lumpectomy on Apr 01, 2018 confirming the staging of disease.  She had an Oncotype DX score of 6 which confers a low risk therefore patient did not require adjuvant chemotherapy.  She completed adjuvant XRT.  Continue letrozole for a total of 5 years completing treatment in August 2024.  Patient will require mammogram in May 2020.  Return to clinic in 6 months for routine evaluation. 2.  Osteopenia: Baseline bone marrow density on July 12, 2018 reported a T score of -1.9.  Continue calcium and vitamin D supplementation.  Repeat in August 2020.    I spent a total of 20 minutes face-to-face with the patient of which greater than 50% of the visit was spent in counseling and coordination of care as detailed above.    Patient expressed understanding and was in agreement with this plan. She also understands that She can call clinic at any time with any questions, concerns, or complaints.   Cancer Staging Primary cancer of upper outer quadrant of right female breast [facility]) Staging form: Breast, AJCC 8th Edition - Clinical stage from 03/26/2018: Stage IA (cT1b, cN0, cM0, G2, ER+, PR+, HER2-) - Signed by Lloyd Huger, MD on 03/26/2018   Lloyd Huger, MD   10/20/2018 8:55 AM

## 2018-10-14 ENCOUNTER — Inpatient Hospital Stay: Payer: PPO | Attending: Oncology | Admitting: Oncology

## 2018-10-14 ENCOUNTER — Other Ambulatory Visit: Payer: Self-pay

## 2018-10-14 ENCOUNTER — Encounter: Payer: Self-pay | Admitting: Oncology

## 2018-10-14 VITALS — BP 129/83 | HR 92 | Temp 97.8°F | Resp 18 | Wt 205.5 lb

## 2018-10-14 DIAGNOSIS — C50411 Malignant neoplasm of upper-outer quadrant of right female breast: Secondary | ICD-10-CM | POA: Diagnosis not present

## 2018-10-14 DIAGNOSIS — M8580 Other specified disorders of bone density and structure, unspecified site: Secondary | ICD-10-CM | POA: Insufficient documentation

## 2018-10-14 MED ORDER — LETROZOLE 2.5 MG PO TABS: 2.5000 mg | ORAL_TABLET | Freq: Every day | ORAL | 2 refills | Status: DC

## 2018-10-14 NOTE — Progress Notes (Signed)
Patient here for follow up. Pt feeling well today, no concerns. Needing refill on letrozole.

## 2018-10-25 DIAGNOSIS — E7800 Pure hypercholesterolemia, unspecified: Secondary | ICD-10-CM | POA: Diagnosis not present

## 2018-10-25 DIAGNOSIS — I10 Essential (primary) hypertension: Secondary | ICD-10-CM | POA: Diagnosis not present

## 2018-10-25 DIAGNOSIS — E039 Hypothyroidism, unspecified: Secondary | ICD-10-CM | POA: Diagnosis not present

## 2018-10-25 DIAGNOSIS — Z79899 Other long term (current) drug therapy: Secondary | ICD-10-CM | POA: Diagnosis not present

## 2018-11-01 DIAGNOSIS — E785 Hyperlipidemia, unspecified: Secondary | ICD-10-CM | POA: Diagnosis not present

## 2018-11-01 DIAGNOSIS — I10 Essential (primary) hypertension: Secondary | ICD-10-CM | POA: Diagnosis not present

## 2018-11-01 DIAGNOSIS — C50919 Malignant neoplasm of unspecified site of unspecified female breast: Secondary | ICD-10-CM | POA: Diagnosis not present

## 2018-11-01 DIAGNOSIS — E7800 Pure hypercholesterolemia, unspecified: Secondary | ICD-10-CM | POA: Diagnosis not present

## 2018-11-01 DIAGNOSIS — E039 Hypothyroidism, unspecified: Secondary | ICD-10-CM | POA: Diagnosis not present

## 2018-12-16 DIAGNOSIS — L570 Actinic keratosis: Secondary | ICD-10-CM | POA: Diagnosis not present

## 2018-12-16 DIAGNOSIS — L821 Other seborrheic keratosis: Secondary | ICD-10-CM | POA: Diagnosis not present

## 2018-12-16 DIAGNOSIS — L820 Inflamed seborrheic keratosis: Secondary | ICD-10-CM | POA: Diagnosis not present

## 2019-01-10 ENCOUNTER — Encounter: Payer: Self-pay | Admitting: Radiation Oncology

## 2019-01-10 ENCOUNTER — Ambulatory Visit
Admission: RE | Admit: 2019-01-10 | Discharge: 2019-01-10 | Disposition: A | Discharge status: Home / Self Care | Payer: PPO | Source: Ambulatory Visit | Attending: Radiation Oncology | Admitting: Radiation Oncology

## 2019-01-10 ENCOUNTER — Other Ambulatory Visit: Payer: Self-pay

## 2019-01-10 VITALS — BP 140/91 | HR 84 | Temp 96.7°F | Resp 18 | Wt 203.7 lb

## 2019-01-10 DIAGNOSIS — Z170 Estrogen receptor positive status [ER+]: Secondary | ICD-10-CM | POA: Insufficient documentation

## 2019-01-10 DIAGNOSIS — L570 Actinic keratosis: Secondary | ICD-10-CM

## 2019-01-10 DIAGNOSIS — Z79811 Long term (current) use of aromatase inhibitors: Secondary | ICD-10-CM | POA: Insufficient documentation

## 2019-01-10 DIAGNOSIS — Z923 Personal history of irradiation: Secondary | ICD-10-CM | POA: Insufficient documentation

## 2019-01-10 DIAGNOSIS — D485 Neoplasm of uncertain behavior of skin: Secondary | ICD-10-CM | POA: Diagnosis not present

## 2019-01-10 DIAGNOSIS — C50411 Malignant neoplasm of upper-outer quadrant of right female breast: Secondary | ICD-10-CM | POA: Diagnosis not present

## 2019-01-10 HISTORY — DX: Actinic keratosis: L57.0

## 2019-01-10 NOTE — Progress Notes (Signed)
Radiation Oncology Follow up Note  Name: Susan Myers   Date:   01/10/2019 MRN:  [id number] DOB: 1951/08/06    This 68 y.o. female presents to the clinic today for 6 month follow-up status post whole breast irradiation to her right breast for stage I ER/PR positive invasive mammary carcinoma.  REFERRING PROVIDER: Idelle Crouch, MD  HPI: patient is a 68 year old female now out 6 months having completed whole breast radiation to her right breast for stage I ER/PR positive HER-2/neu negative invasive mammary carcinoma..she is seen today in routine follow-up is doing well. She specifically denies breast tenderness cough or bone pain.she is currently on Femara tolerating that well without side effect.she is scheduled for mammogram in April.  COMPLICATIONS OF TREATMENT: none  FOLLOW UP COMPLIANCE: keeps appointments   PHYSICAL EXAM:  BP (!) 140/91 (BP Location: Left Arm, Patient Position: Sitting)   Pulse 84   Temp (!) 96.7 F (35.9 C) (Tympanic)   Resp 18   Wt 203 lb 11.3 oz (92.4 kg)   BMI 37.26 kg/m  Lungs are clear to A&P cardiac examination essentially unremarkable with regular rate and rhythm. No dominant mass or nodularity is noted in either breast in 2 positions examined. Incision is well-healed. No axillary or supraclavicular adenopathy is appreciated. Cosmetic result is excellent.Well-developed well-nourished patient in NAD. HEENT reveals PERLA, EOMI, discs not visualized.  Oral cavity is clear. No oral mucosal lesions are identified. Neck is clear without evidence of cervical or supraclavicular adenopathy. Lungs are clear to A&P. Cardiac examination is essentially unremarkable with regular rate and rhythm without murmur rub or thrill. Abdomen is benign with no organomegaly or masses noted. Motor sensory and DTR levels are equal and symmetric in the upper and lower extremities. Cranial nerves II through XII are grossly intact. Proprioception is intact. No peripheral adenopathy  or edema is identified. No motor or sensory levels are noted. Crude visual fields are within normal range.  RADIOLOGY RESULTS: no current films to review  PLAN: present time patient is doing well now out 6 months from whole breast radiation. I'm please were overall progress. She scheduled for follow-up mammograms in April. She continues on Femara without side effect. I have set her up for 6 month follow-up. Patient is to call sooner with any concerns.  I would like to take this opportunity to thank you for allowing me to participate in the care of your patient.[name], MD

## 2019-01-16 IMAGING — MG MM PLC BREAST LOC DEV 1ST LESION INC*R*
8 of 9 series · 8 of 9 positions shown · non-contrast
Comparison: Previous exams.

CLINICAL DATA: Recently diagnosed invasive mammary carcinoma in the
upper-outer quadrant of the right breast.

EXAM:
NEEDLE LOCALIZATION OF THE RIGHT BREAST WITH MAMMO GUIDANCE

[R ML (1 of 3)]
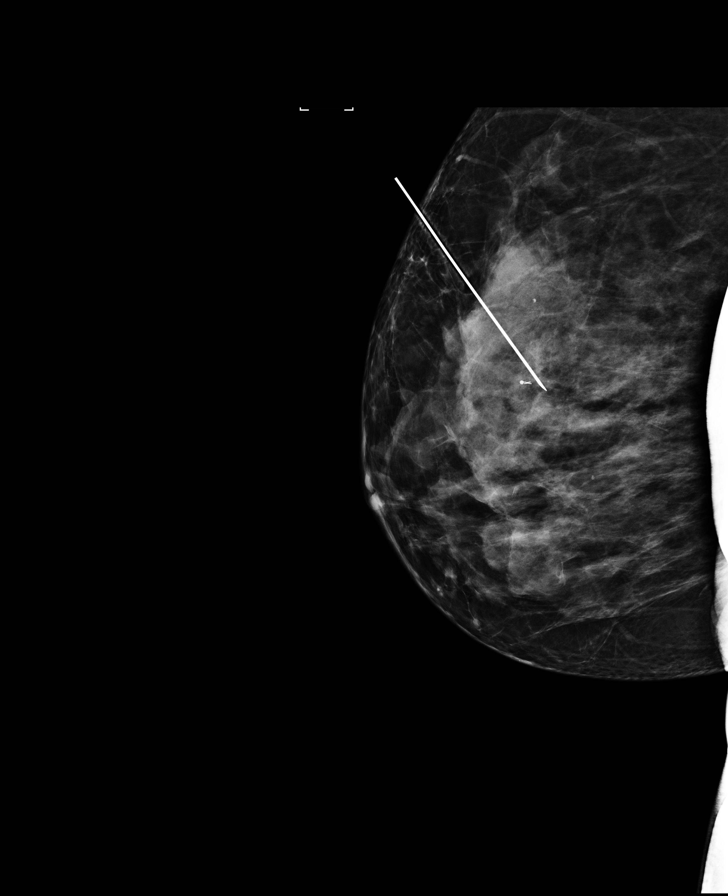

[R CC (1 of 5)]
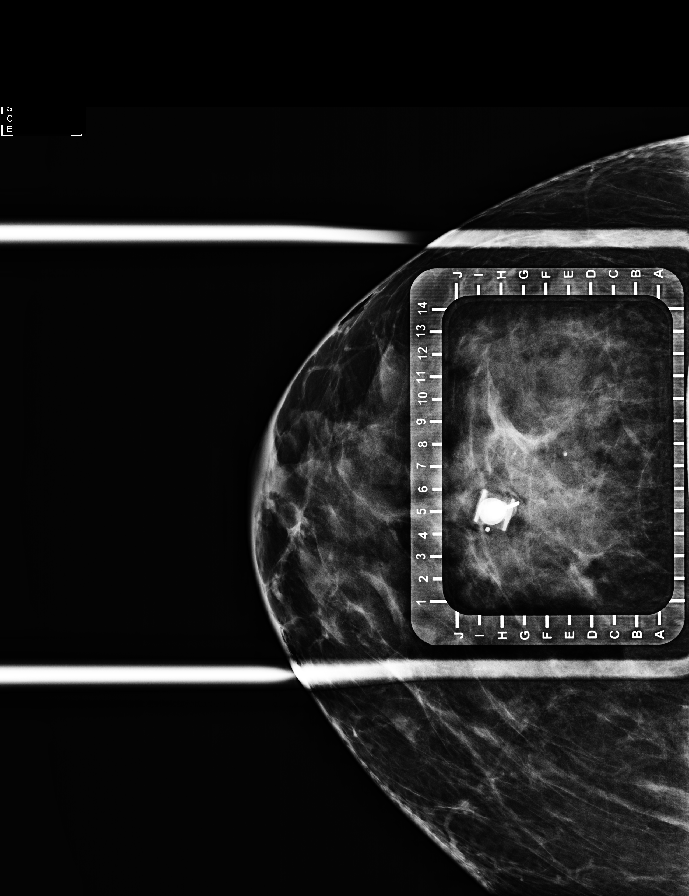

[R ML (2 of 3)]
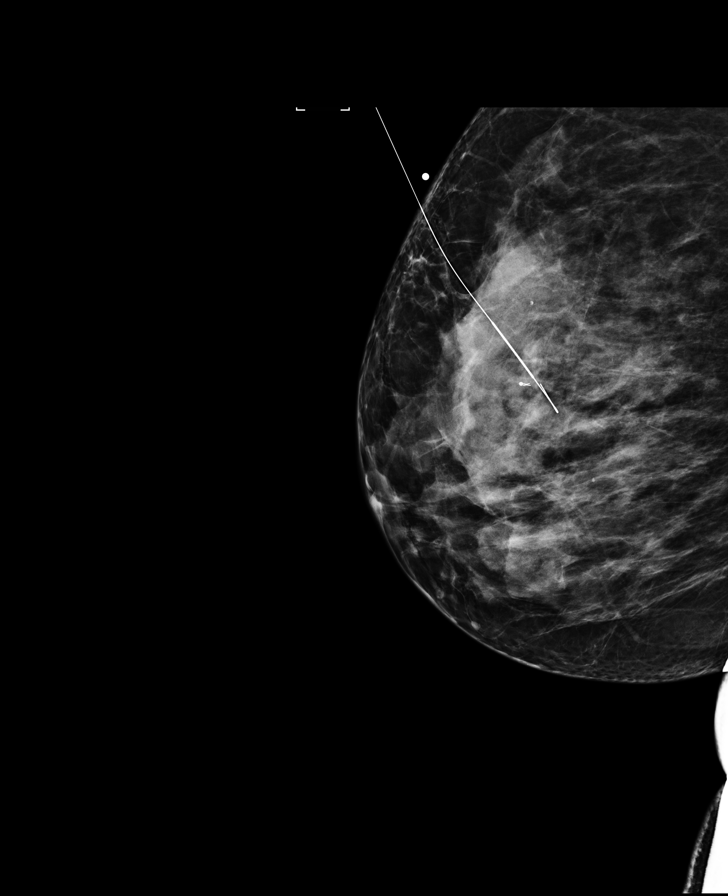

[R CC (2 of 5)]
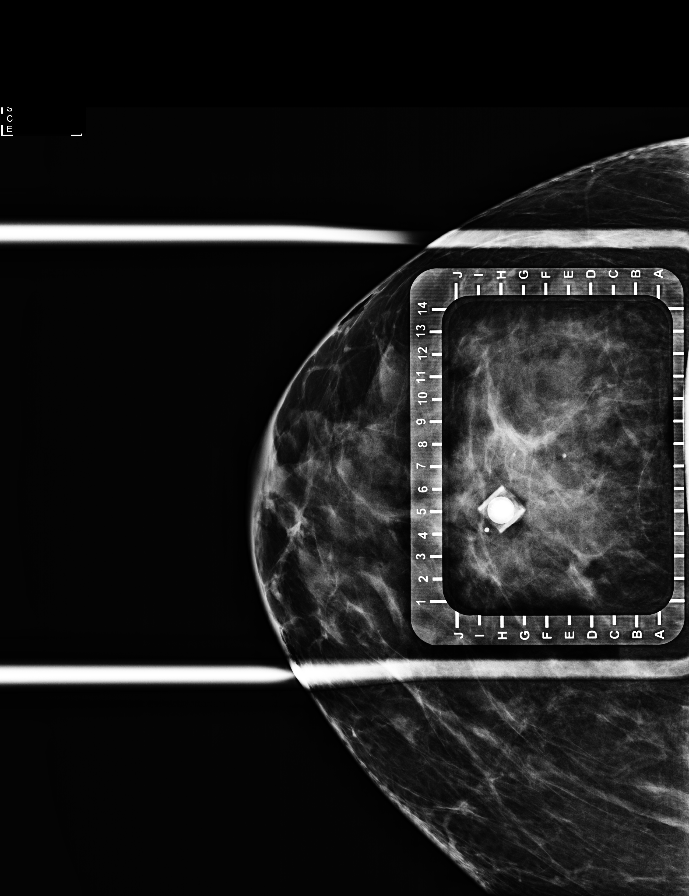

[R CC (3 of 5)]
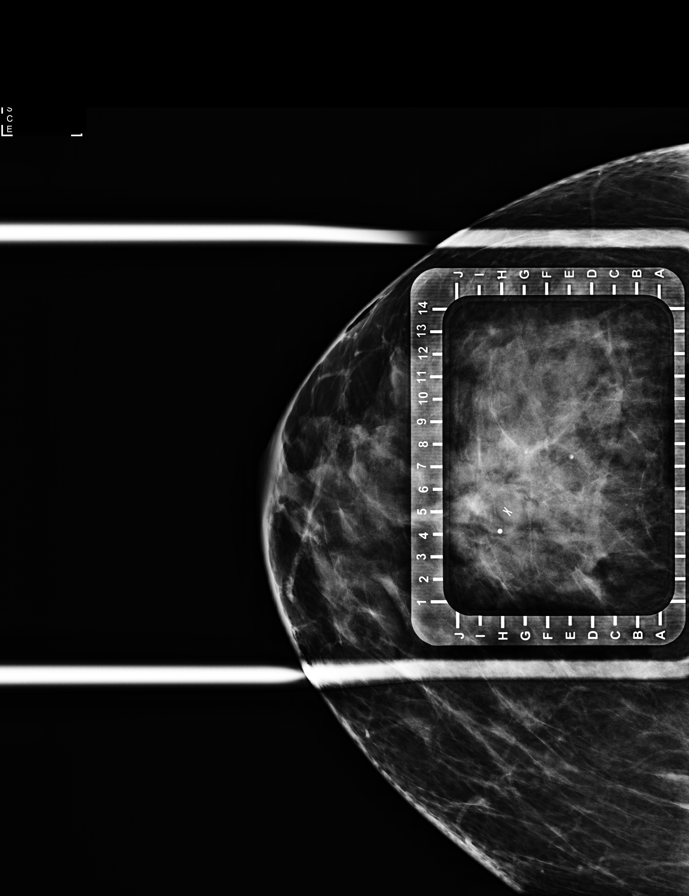

[R ML (3 of 3)]
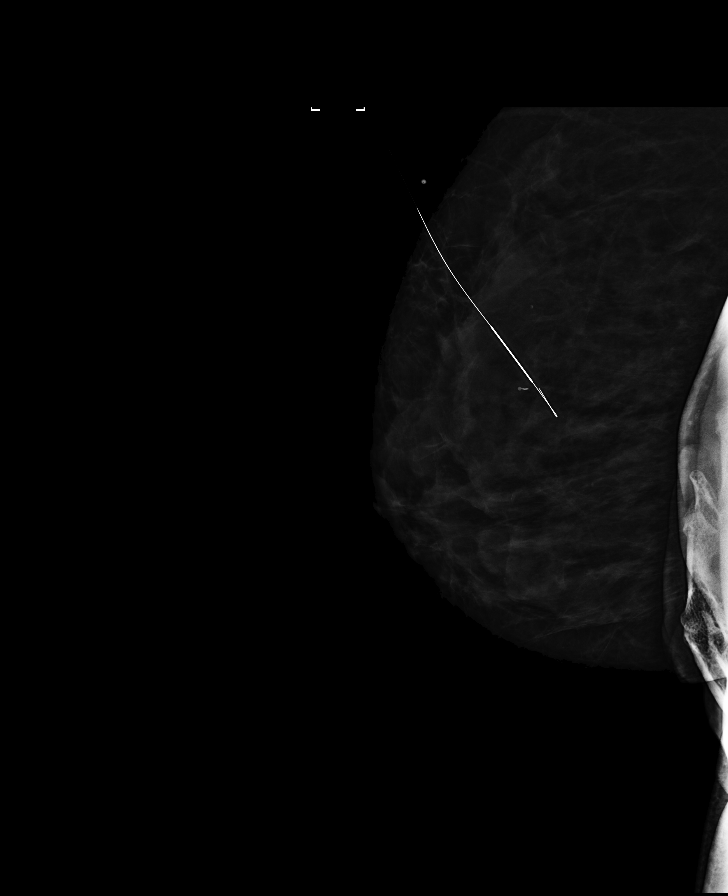

[R CC (4 of 5)]
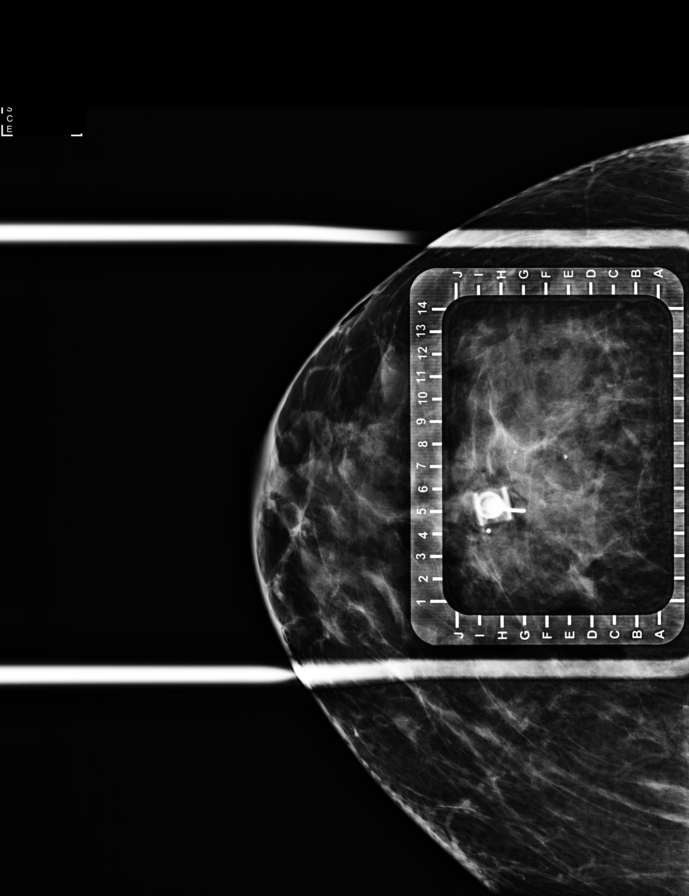

[R CC (5 of 5)]
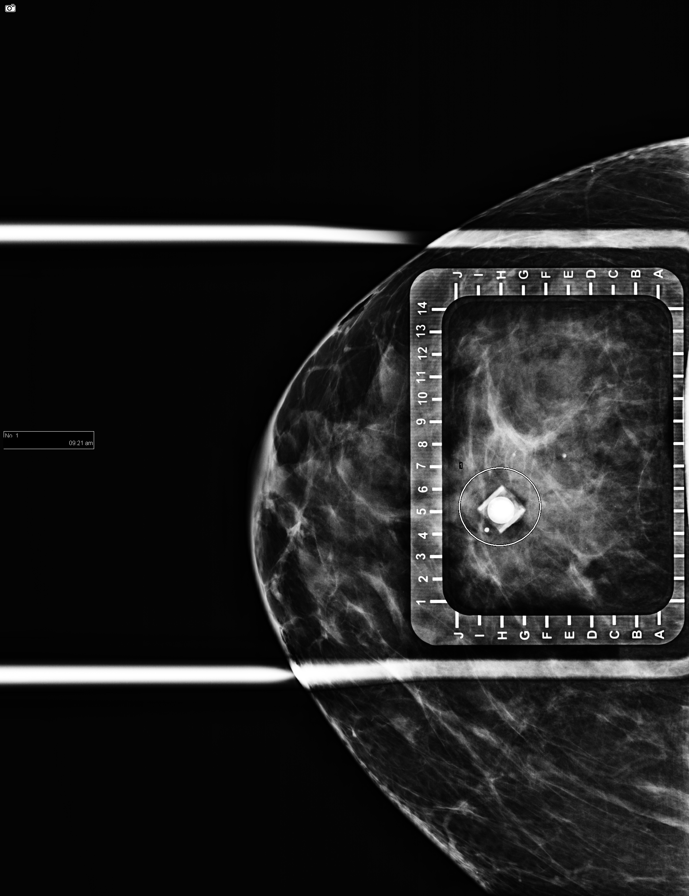

[8 of 9 positions shown; findings below may reference images not displayed]

FINDINGS: Patient presents for needle localization prior to right lumpectomy.
I met with the patient and we discussed the procedure of needle
localization including benefits and alternatives. We discussed the
high likelihood of a successful procedure. We discussed the risks of
the procedure, including infection, bleeding, tissue injury, and
further surgery. Informed, written consent was given. The usual
time-out protocol was performed immediately prior to the procedure.

Using mammographic guidance, sterile technique, 1% lidocaine and a 7
cm modified Kopans needle, the recently placed X shaped biopsy
marker clip in the upper-outer quadrant of the right breast was
localized using cephalad approach. The images were marked for Dr.
Sanja.
IMPRESSION: Needle localization right breast. No apparent complications.

## 2019-01-17 ENCOUNTER — Other Ambulatory Visit (INDEPENDENT_AMBULATORY_CARE_PROVIDER_SITE_OTHER): Payer: Self-pay | Admitting: Vascular Surgery

## 2019-01-17 ENCOUNTER — Encounter (INDEPENDENT_AMBULATORY_CARE_PROVIDER_SITE_OTHER): Payer: Self-pay | Admitting: Vascular Surgery

## 2019-01-17 ENCOUNTER — Other Ambulatory Visit: Payer: Self-pay

## 2019-01-17 ENCOUNTER — Ambulatory Visit (INDEPENDENT_AMBULATORY_CARE_PROVIDER_SITE_OTHER): Payer: PPO | Admitting: Vascular Surgery

## 2019-01-17 ENCOUNTER — Other Ambulatory Visit (INDEPENDENT_AMBULATORY_CARE_PROVIDER_SITE_OTHER): Payer: PPO

## 2019-01-17 VITALS — BP 118/80 | HR 78 | Resp 16 | Ht 63.0 in | Wt 203.6 lb

## 2019-01-17 DIAGNOSIS — I82513 Chronic embolism and thrombosis of femoral vein, bilateral: Secondary | ICD-10-CM | POA: Diagnosis not present

## 2019-01-17 DIAGNOSIS — I872 Venous insufficiency (chronic) (peripheral): Secondary | ICD-10-CM

## 2019-01-17 DIAGNOSIS — E7800 Pure hypercholesterolemia, unspecified: Secondary | ICD-10-CM | POA: Diagnosis not present

## 2019-01-17 DIAGNOSIS — I890 Lymphedema, not elsewhere classified: Secondary | ICD-10-CM | POA: Diagnosis not present

## 2019-01-17 DIAGNOSIS — Z95828 Presence of other vascular implants and grafts: Secondary | ICD-10-CM

## 2019-01-17 NOTE — Progress Notes (Signed)
MRN : [id number]  Susan Myers is a 67 y.o. (1951-10-14) female who presents with chief complaint of  Chief Complaint  Patient presents with  . Follow-up    32yr ultrasound follow up  .  History of Present Illness:   The patient returns to the office for followup regarding her IVC filter.  She is status post angiogram withattempted IVC filter removal on 12/16/2016. Hematuria has not recurred and she is now back on her Xarelto. Swelling of her left leg remains stable and "not much" at this time.  There have been no significant changes to the patient's overall health care.  The patient denies amaurosis fugax or recent TIA symptoms. There are no recent neurological changes noted. The patient denies history of DVT, PE or superficial thrombophlebitis. The patient denies recent episodes of angina or shortness of breath.   Duplex ultrasound of the IVC is negative for clot normal flow pattern is identified  Current Meds  Medication Sig  . acetaminophen (TYLENOL) 500 MG tablet Take 500 mg by mouth every 6 (six) hours as needed for moderate pain or headache.  . Cholecalciferol (VITAMIN D PO) Take 1 tablet by mouth once a week.  . diphenhydrAMINE (BENADRYL) 25 MG tablet Take 25 mg by mouth at bedtime as needed for sleep.  . hydrochlorothiazide (HYDRODIURIL) 25 MG tablet Take 25 mg by mouth daily.  Marland Kitchen letrozole (FEMARA) 2.5 MG tablet Take 1 tablet (2.5 mg total) by mouth daily.  Marland Kitchen levothyroxine (SYNTHROID, LEVOTHROID) 125 MCG tablet   . meloxicam (MOBIC) 7.5 MG tablet Take 7.5 mg by mouth daily as needed for pain.  Marland Kitchen omeprazole (PRILOSEC) 20 MG capsule Take 20 mg by mouth daily as needed (acid reflux).   . phenylephrine (SUDAFED PE) 10 MG TABS tablet Take 10 mg by mouth every 6 (six) hours as needed (congestion).  . risperiDONE (RISPERDAL) 1 MG tablet Take 1 mg by mouth at bedtime.  . rivaroxaban (XARELTO) 20 MG TABS tablet Take 20 mg by mouth daily.  . simvastatin (ZOCOR) 20  MG tablet Take 20 mg by mouth every evening.     Past Medical History:  Diagnosis Date  . Breast cancer ([place]) 2019   invasive mammary carcinoma  . Complication of anesthesia   . Delusional disorder ([place])   . DVT (deep venous thrombosis) ([place]) 06/2016  . GERD (gastroesophageal reflux disease)   . History of kidney stones   . Hx of blood clots   . Hypercholesterolemia   . Hypothyroidism   . Kidney stones   . Personal history of radiation therapy 2019   right breast cancer  . PONV (postoperative nausea and vomiting)   . Thyroid disease     Past Surgical History:  Procedure Laterality Date  . ABDOMINAL HYSTERECTOMY    . APPENDECTOMY    . BREAST BIOPSY Right 03/23/2018   Affirm Bx- invasive mammary carcinoma  . BREAST CYST ASPIRATION Bilateral 1990  . BREAST LUMPECTOMY Right 2019   invasive mammary carcinoma  . COLONOSCOPY    . IVC FILTER INSERTION    . PARTIAL MASTECTOMY WITH NEEDLE LOCALIZATION Right 04/01/2018   Procedure: PARTIAL MASTECTOMY WITH NEEDLE LOCALIZATION;  Surgeon: Herbert Pun, MD;  Location: [facility];  Service: General;  Laterality: Right;  . PERIPHERAL VASCULAR CATHETERIZATION N/A 08/06/2016   Procedure: IVC Filter Insertion;  Surgeon: Katha Cabal, MD;  Location: [place] [facility];  Service: Cardiovascular;  Laterality: N/A;  . PERIPHERAL VASCULAR CATHETERIZATION Left 08/06/2016   Procedure: Lower  Extremity Venography;  Surgeon: Katha Cabal, MD;  Location: [place] [facility];  Service: Cardiovascular;  Laterality: Left;  . PERIPHERAL VASCULAR CATHETERIZATION  08/06/2016   Procedure: Lower Extremity Intervention;  Surgeon: Katha Cabal, MD;  Location: [place] [facility];  Service: Cardiovascular;;  . PERIPHERAL VASCULAR CATHETERIZATION N/A 12/16/2016   Procedure: IVC Filter Removal;  Surgeon: Katha Cabal, MD;  Location: [place] [facility];  Service: Cardiovascular;  Laterality: N/A;  . SENTINEL NODE BIOPSY Right 04/01/2018    Procedure: SENTINEL NODE BIOPSY;  Surgeon: Herbert Pun, MD;  Location: [facility];  Service: General;  Laterality: Right;  . TONSILLECTOMY      Social History Social History   Tobacco Use  . Smoking status: Never Smoker  . Smokeless tobacco: Never Used  Substance Use Topics  . Alcohol use: No  . Drug use: No    Family History Family History  Problem Relation Age of Onset  . Breast cancer Neg Hx   . Prostate cancer Neg Hx   . Kidney cancer Neg Hx     Allergies  Allergen Reactions  . Levaquin [Levofloxacin In D5w] Hives  . Macrolides And Ketolides     WHELPS  . Penicillins Itching    Can take amoxicillin  Has patient had a PCN reaction causing immediate rash, facial/tongue/throat swelling, SOB or lightheadedness with hypotension:No Has patient had a PCN reaction causing severe rash involving mucus membranes or skin necrosis:unsure Has patient had a PCN reaction that required hospitalization:No Has patient had a PCN reaction occurring within the last 10 years: No If all of the above answers are "NO", then may proceed with Cephalosporin use.  . Sulfa Antibiotics Hives     REVIEW OF SYSTEMS (Negative unless checked)  Constitutional: [] Weight loss  [] Fever  [] Chills Cardiac: [] Chest pain   [] Chest pressure   [] Palpitations   [] Shortness of breath when laying flat   [] Shortness of breath with exertion. Vascular:  [] Pain in legs with walking   [] Pain in legs at rest  [] History of DVT   [] Phlebitis   [x] Swelling in legs   [] Varicose veins   [] Non-healing ulcers Pulmonary:   [] Uses home oxygen   [] Productive cough   [] Hemoptysis   [] Wheeze  [] COPD   [] Asthma Neurologic:  [] Dizziness   [] Seizures   [] History of stroke   [] History of TIA  [] Aphasia   [] Vissual changes   [] Weakness or numbness in arm   [] Weakness or numbness in leg Musculoskeletal:   [] Joint swelling   [x] Joint pain   [] Low back pain Hematologic:  [] Easy bruising  [] Easy bleeding   [] Hypercoagulable state    [] Anemic Gastrointestinal:  [] Diarrhea   [] Vomiting  [] Gastroesophageal reflux/heartburn   [] Difficulty swallowing. Genitourinary:  [] Chronic kidney disease   [] Difficult urination  [] Frequent urination   [] Blood in urine Skin:  [] Rashes   [] Ulcers  Psychological:  [] History of anxiety   []  History of major depression.  Physical Examination  Vitals:   01/17/19 0830  BP: 118/80  Pulse: 78  Resp: 16  Weight: 203 lb 9.6 oz (92.4 kg)  Height: 5\' 3"  (1.6 m)   Body mass index is 36.07 kg/m. Gen: WD/WN, NAD Head: [place]/AT, No temporalis wasting.  Ear/Nose/Throat: Hearing grossly intact, nares w/o erythema or drainage Eyes: PER, EOMI, sclera nonicteric.  Neck: Supple, no large masses.   Pulmonary:  Good air movement, no audible wheezing bilaterally, no use of accessory muscles.  Cardiac: RRR, no JVD Vascular: scattered varicosities present bilaterally.  Mild  venous stasis changes to the legs bilaterally.  2+ soft pitting edema Vessel Right Left  Radial Palpable Palpable  Gastrointestinal: Non-distended. No guarding/no peritoneal signs.  Musculoskeletal: M/S 5/5 throughout.  No deformity or atrophy.  Neurologic: CN 2-12 intact. Symmetrical.  Speech is fluent. Motor exam as listed above. Psychiatric: Judgment intact, Mood & affect appropriate for pt's clinical situation. Dermatologic: No rashes or ulcers noted.  No changes consistent with cellulitis. Lymph : No lichenification or skin changes of chronic lymphedema.  CBC Lab Results  Component Value Date   WBC 5.5 06/15/2018   HGB 14.5 06/15/2018   HCT 42.7 06/15/2018   MCV 93.7 06/15/2018   PLT 168 06/15/2018    BMET    Component Value Date/Time   NA 143 11/23/2015 2103   K 3.4 (L) 11/23/2015 2103   CL 105 11/23/2015 2103   CO2 28 11/23/2015 2103   GLUCOSE 101 (H) 11/23/2015 2103   BUN 23 (H) 08/06/2016 1426   CREATININE 0.88 08/06/2016 1426   CALCIUM 9.7 11/23/2015 2103   GFRNONAA >60 08/06/2016 1426   GFRAA >60  08/06/2016 1426   CrCl cannot be calculated (Patient's most recent lab result is older than the maximum 21 days allowed.).  COAG Lab Results  Component Value Date   INR 1.33 08/06/2016    Radiology No results found.  Assessment/Plan 1. Chronic deep vein thrombosis (DVT) of femoral vein of both lower extremities (HCC) Recommend:   No surgery or intervention at this point in time.  IVC filter is notable to be removed and will therefore remain.  Patient's duplex ultrasound of the IVC shows normal flow pattern.  The patientwill continueon anticoagulation   Elevation was stressed, use of a recliner was discussed.  I have had a long discussion with the patient regarding DVT and post phlebitic changes such as swelling and why it causes symptoms such as pain. The patient will wear graduated compression stockings class 1 (20-30 mmHg), beginning after three full days of anticoagulation, on a daily basis a prescription was given. The patient will beginning wearing the stockings first thing in the morning and removing them in the evening. The patient is instructed specifically not to sleep in the stockings. In addition, behavioral modification including elevation during the day and avoidance of prolonged dependency will be initiated.   The patient will continue anticoagulation for now as there have not been any problems or complications at this point.  - VAS US AORTA/IVC/ILIACS; Future  2. Chronic venous insufficiency See #1  3. Lymphedema No surgery or intervention at this point in time.    I have reviewed my discussion with the patient regarding venous insufficiency and secondary lymph edema and why it  causes symptoms. I have discussed with the patient the chronic skin changes that accompany these problems and the long term sequela such as ulceration and infection.  Patient will continue wearing graduated compression stockings class 1 (20-30 mmHg) on a daily basis a  prescription was given to the patient to keep this updated. The patient will  put the stockings on first thing in the morning and removing them in the evening. The patient is instructed specifically not to sleep in the stockings.  In addition, behavioral modification including elevation during the day will be continued.  Diet and salt restriction was also discussed.  Previous duplex ultrasound of the lower extremities shows normal deep venous system, superficial reflux was not present.   No indication for a lymph pump at this time.  4.  Hypercholesteremia Continue statin as ordered and reviewed, no changes at this time   Hortencia Pilar, MD  01/17/2019 8:34 AM

## 2019-01-26 DIAGNOSIS — E039 Hypothyroidism, unspecified: Secondary | ICD-10-CM | POA: Diagnosis not present

## 2019-02-02 DIAGNOSIS — E7800 Pure hypercholesterolemia, unspecified: Secondary | ICD-10-CM | POA: Diagnosis not present

## 2019-02-02 DIAGNOSIS — Z1211 Encounter for screening for malignant neoplasm of colon: Secondary | ICD-10-CM | POA: Diagnosis not present

## 2019-02-02 DIAGNOSIS — I82402 Acute embolism and thrombosis of unspecified deep veins of left lower extremity: Secondary | ICD-10-CM | POA: Diagnosis not present

## 2019-02-02 DIAGNOSIS — I10 Essential (primary) hypertension: Secondary | ICD-10-CM | POA: Diagnosis not present

## 2019-02-02 DIAGNOSIS — Z0000 Encounter for general adult medical examination without abnormal findings: Secondary | ICD-10-CM | POA: Diagnosis not present

## 2019-02-02 DIAGNOSIS — C50919 Malignant neoplasm of unspecified site of unspecified female breast: Secondary | ICD-10-CM | POA: Diagnosis not present

## 2019-02-02 DIAGNOSIS — Z79899 Other long term (current) drug therapy: Secondary | ICD-10-CM | POA: Diagnosis not present

## 2019-02-02 DIAGNOSIS — E039 Hypothyroidism, unspecified: Secondary | ICD-10-CM | POA: Diagnosis not present

## 2019-02-14 DIAGNOSIS — Z1211 Encounter for screening for malignant neoplasm of colon: Secondary | ICD-10-CM | POA: Diagnosis not present

## 2019-03-21 ENCOUNTER — Ambulatory Visit
Admission: RE | Admit: 2019-03-21 | Discharge: 2019-03-21 | Disposition: A | Discharge status: Home / Self Care | Payer: PPO | Source: Ambulatory Visit | Attending: Oncology | Admitting: Oncology

## 2019-03-21 ENCOUNTER — Other Ambulatory Visit: Payer: Self-pay

## 2019-03-21 DIAGNOSIS — C50411 Malignant neoplasm of upper-outer quadrant of right female breast: Secondary | ICD-10-CM

## 2019-03-21 DIAGNOSIS — R922 Inconclusive mammogram: Secondary | ICD-10-CM | POA: Diagnosis not present

## 2019-03-22 DIAGNOSIS — L578 Other skin changes due to chronic exposure to nonionizing radiation: Secondary | ICD-10-CM | POA: Diagnosis not present

## 2019-03-22 DIAGNOSIS — L570 Actinic keratosis: Secondary | ICD-10-CM | POA: Diagnosis not present

## 2019-03-22 DIAGNOSIS — L919 Hypertrophic disorder of the skin, unspecified: Secondary | ICD-10-CM | POA: Diagnosis not present

## 2019-03-22 DIAGNOSIS — D485 Neoplasm of uncertain behavior of skin: Secondary | ICD-10-CM | POA: Diagnosis not present

## 2019-03-29 DIAGNOSIS — Z853 Personal history of malignant neoplasm of breast: Secondary | ICD-10-CM | POA: Diagnosis not present

## 2019-04-22 NOTE — Progress Notes (Signed)
[place]  [phone] [phone]) [phone]  ID: Susan Myers OB: 09/13/1951  MR#: [id number]  LGX#:[id number]  Patient Care Team: Idelle Crouch, MD as PCP - General (Internal Medicine)  I connected with Susan Myers on 04/27/19 at 11:00 AM EDT by video enabled telemedicine visit and verified that I am speaking with the correct person using two identifiers.   I discussed the limitations, risks, security and privacy concerns of performing an evaluation and management service by telemedicine and the availability of in-person appointments. I also discussed with the patient that there may be a patient responsible charge related to this service. The patient expressed understanding and agreed to proceed.   Other persons participating in the visit and their role in the encounter: Patient, MD  Patient's location: Home Provider's location: Clinic  CHIEF COMPLAINT: Clinical stage IA ER/PR positive, HER-2 negative invasive carcinoma of the upper outer quadrant of the right breast.  INTERVAL HISTORY: Patient agreed to video enabled telemedicine visit for her routine follow-up for the above-stated breast cancer.  She currently feels well and is asymptomatic.  She is tolerating letrozole without significant side effects. She has no neurologic complaints.  She denies any recent fevers or illnesses.  She has a good appetite and denies weight loss.  She denies any chest pain, shortness of breath, cough, or hemoptysis.  She denies any nausea, vomiting, constipation, or diarrhea.  She has no urinary complaints.  Patient offers no specific complaints today.  REVIEW OF SYSTEMS:   Review of Systems  Constitutional: Negative.  Negative for fever, malaise/fatigue and weight loss.  Respiratory: Negative.  Negative for cough and shortness of breath.   Cardiovascular: Negative.  Negative for chest pain and leg swelling.  Gastrointestinal: Negative.  Negative for  abdominal pain.  Genitourinary: Negative.  Negative for dysuria.  Musculoskeletal: Negative.  Negative for back pain.  Skin: Negative.  Negative for rash.  Neurological: Negative.  Negative for sensory change, focal weakness and weakness.  Psychiatric/Behavioral: Negative.  The patient is not nervous/anxious.     As per HPI. Otherwise, a complete review of systems is negative.  PAST MEDICAL HISTORY: Past Medical History:  Diagnosis Date  . Breast cancer ([place]) 2019   invasive mammary carcinoma  . Complication of anesthesia   . Delusional disorder ([place])   . DVT (deep venous thrombosis) ([place]) 06/2016  . GERD (gastroesophageal reflux disease)   . History of kidney stones   . Hx of blood clots   . Hypercholesterolemia   . Hypothyroidism   . Kidney stones   . Personal history of radiation therapy 2019   right breast cancer  . PONV (postoperative nausea and vomiting)   . Thyroid disease     PAST SURGICAL HISTORY: Past Surgical History:  Procedure Laterality Date  . ABDOMINAL HYSTERECTOMY    . APPENDECTOMY    . BREAST BIOPSY Right 03/23/2018   Affirm Bx- invasive mammary carcinoma  . BREAST CYST ASPIRATION Bilateral 1990  . BREAST LUMPECTOMY Right 2019   invasive mammary carcinoma  . COLONOSCOPY    . IVC FILTER INSERTION    . PARTIAL MASTECTOMY WITH NEEDLE LOCALIZATION Right 04/01/2018   Procedure: PARTIAL MASTECTOMY WITH NEEDLE LOCALIZATION;  Surgeon: Herbert Pun, MD;  Location: [facility];  Service: General;  Laterality: Right;  . PERIPHERAL VASCULAR CATHETERIZATION N/A 08/06/2016   Procedure: IVC Filter Insertion;  Surgeon: Katha Cabal, MD;  Location: [place] [facility];  Service: Cardiovascular;  Laterality: N/A;  . PERIPHERAL VASCULAR  CATHETERIZATION Left 08/06/2016   Procedure: Lower Extremity Venography;  Surgeon: Katha Cabal, MD;  Location: [place] [facility];  Service: Cardiovascular;  Laterality: Left;  . PERIPHERAL VASCULAR CATHETERIZATION   08/06/2016   Procedure: Lower Extremity Intervention;  Surgeon: Katha Cabal, MD;  Location: [place] [facility];  Service: Cardiovascular;;  . PERIPHERAL VASCULAR CATHETERIZATION N/A 12/16/2016   Procedure: IVC Filter Removal;  Surgeon: Katha Cabal, MD;  Location: [place] [facility];  Service: Cardiovascular;  Laterality: N/A;  . SENTINEL NODE BIOPSY Right 04/01/2018   Procedure: SENTINEL NODE BIOPSY;  Surgeon: Herbert Pun, MD;  Location: [facility];  Service: General;  Laterality: Right;  . TONSILLECTOMY      FAMILY HISTORY: Family History  Problem Relation Age of Onset  . Breast cancer Neg Hx   . Prostate cancer Neg Hx   . Kidney cancer Neg Hx     ADVANCED DIRECTIVES (Y/N):  N  HEALTH MAINTENANCE: Social History   Tobacco Use  . Smoking status: Never Smoker  . Smokeless tobacco: Never Used  Substance Use Topics  . Alcohol use: No  . Drug use: No     Colonoscopy:  PAP:  Bone density:  Lipid panel:  Allergies  Allergen Reactions  . Levaquin [Levofloxacin In D5w] Hives  . Macrolides And Ketolides Other (See Comments)    WHELPS WHELPS WHELPS  . Penicillins Itching    Can take amoxicillin  Has patient had a PCN reaction causing immediate rash, facial/tongue/throat swelling, SOB or lightheadedness with hypotension:No Has patient had a PCN reaction causing severe rash involving mucus membranes or skin necrosis:unsure Has patient had a PCN reaction that required hospitalization:No Has patient had a PCN reaction occurring within the last 10 years: No If all of the above answers are "NO", then may proceed with Cephalosporin use.  . Sulfa Antibiotics Hives    Current Outpatient Medications  Medication Sig Dispense Refill  . acetaminophen (TYLENOL) 500 MG tablet Take 500 mg by mouth every 6 (six) hours as needed for moderate pain or headache.    . diphenhydrAMINE (BENADRYL) 25 MG tablet Take 25 mg by mouth at bedtime as needed for sleep.    .  hydrochlorothiazide (HYDRODIURIL) 25 MG tablet Take 25 mg by mouth daily.    Marland Kitchen letrozole (FEMARA) 2.5 MG tablet Take 1 tablet (2.5 mg total) by mouth daily. 90 tablet 2  . levothyroxine (SYNTHROID, LEVOTHROID) 125 MCG tablet     . meloxicam (MOBIC) 7.5 MG tablet Take 7.5 mg by mouth daily as needed for pain.    Marland Kitchen omeprazole (PRILOSEC) 20 MG capsule Take 20 mg by mouth daily as needed (acid reflux).     . risperiDONE (RISPERDAL) 1 MG tablet Take 1 mg by mouth at bedtime.    . rivaroxaban (XARELTO) 20 MG TABS tablet Take 20 mg by mouth daily.    . simvastatin (ZOCOR) 20 MG tablet Take 20 mg by mouth every evening.     . Cholecalciferol (VITAMIN D PO) Take 1 tablet by mouth once a week.    . phenylephrine (SUDAFED PE) 10 MG TABS tablet Take 10 mg by mouth every 6 (six) hours as needed (congestion).     No current facility-administered medications for this visit.     OBJECTIVE: There were no vitals filed for this visit.   There is no height or weight on file to calculate BMI.    ECOG FS:0 - Asymptomatic  General: Well-developed, well-nourished, no acute distress. HEENT: Normocephalic. Neuro:  Alert, answering all questions appropriately. Cranial nerves grossly intact. Skin: No rashes or petechiae noted. Psych: Normal affect.  LAB RESULTS:  Lab Results  Component Value Date   NA 143 11/23/2015   K 3.4 (L) 11/23/2015   CL 105 11/23/2015   CO2 28 11/23/2015   GLUCOSE 101 (H) 11/23/2015   BUN 23 (H) 08/06/2016   CREATININE 0.88 08/06/2016   CALCIUM 9.7 11/23/2015   PROT 7.5 11/23/2015   ALBUMIN 4.8 11/23/2015   AST 22 11/23/2015   ALT 24 11/23/2015   ALKPHOS 88 11/23/2015   BILITOT 0.8 11/23/2015   GFRNONAA >60 08/06/2016   GFRAA >60 08/06/2016    Lab Results  Component Value Date   WBC 5.5 06/15/2018   HGB 14.5 06/15/2018   HCT 42.7 06/15/2018   MCV 93.7 06/15/2018   PLT 168 06/15/2018     STUDIES: No results found.  ASSESSMENT: Clinical stage IA ER/PR positive,  HER-2 negative invasive carcinoma of the upper outer quadrant of the right breast.  Oncotype DX score 6, low risk.  PLAN:    1. Clinical stage IA ER/PR positive, HER-2 negative invasive carcinoma of the upper outer quadrant of the right breast: Patient underwent lumpectomy on Apr 01, 2018 confirming the staging of disease.  She had an Oncotype DX score of 6 which confers a low risk therefore patient did not require adjuvant chemotherapy.  She completed adjuvant XRT.  Continue letrozole for total of 5 years completing treatment in August 2024.  Her most recent mammogram on March 18, 2019 was reported as BI-RADS 2.  Repeat in 1 year.  Return to clinic in 6 months for routine evaluation.   2.  Osteopenia: Baseline bone marrow density on July 12, 2018 reported a T score of -1.9.  Continue calcium and vitamin D supplementation.  Repeat in August 2020.    I provided 15 minutes of face-to-face video visit time during this encounter, and > 50% was spent counseling as documented under my assessment & plan.    Patient expressed understanding and was in agreement with this plan. She also understands that She can call clinic at any time with any questions, concerns, or complaints.   Cancer Staging Primary cancer of upper outer quadrant of right female breast [facility]) Staging form: Breast, AJCC 8th Edition - Clinical stage from 03/26/2018: Stage IA (cT1b, cN0, cM0, G2, ER+, PR+, HER2-) - Signed by Lloyd Huger, MD on 03/26/2018   Lloyd Huger, MD   04/27/2019 9:48 AM

## 2019-04-25 ENCOUNTER — Other Ambulatory Visit: Payer: Self-pay

## 2019-04-25 DIAGNOSIS — E7800 Pure hypercholesterolemia, unspecified: Secondary | ICD-10-CM | POA: Diagnosis not present

## 2019-04-25 DIAGNOSIS — M7121 Synovial cyst of popliteal space [Baker], right knee: Secondary | ICD-10-CM | POA: Diagnosis not present

## 2019-04-25 DIAGNOSIS — M25561 Pain in right knee: Secondary | ICD-10-CM | POA: Diagnosis not present

## 2019-04-25 DIAGNOSIS — R8290 Unspecified abnormal findings in urine: Secondary | ICD-10-CM | POA: Diagnosis not present

## 2019-04-25 DIAGNOSIS — M1711 Unilateral primary osteoarthritis, right knee: Secondary | ICD-10-CM | POA: Diagnosis not present

## 2019-04-25 DIAGNOSIS — E039 Hypothyroidism, unspecified: Secondary | ICD-10-CM | POA: Diagnosis not present

## 2019-04-25 DIAGNOSIS — Z79899 Other long term (current) drug therapy: Secondary | ICD-10-CM | POA: Diagnosis not present

## 2019-04-25 DIAGNOSIS — I10 Essential (primary) hypertension: Secondary | ICD-10-CM | POA: Diagnosis not present

## 2019-04-26 ENCOUNTER — Inpatient Hospital Stay: Payer: PPO | Attending: Oncology | Admitting: Oncology

## 2019-04-26 ENCOUNTER — Encounter: Payer: Self-pay | Admitting: Oncology

## 2019-04-26 ENCOUNTER — Other Ambulatory Visit: Payer: Self-pay

## 2019-04-26 DIAGNOSIS — M8580 Other specified disorders of bone density and structure, unspecified site: Secondary | ICD-10-CM | POA: Diagnosis not present

## 2019-04-26 DIAGNOSIS — Z170 Estrogen receptor positive status [ER+]: Secondary | ICD-10-CM

## 2019-04-26 DIAGNOSIS — R5382 Chronic fatigue, unspecified: Secondary | ICD-10-CM | POA: Insufficient documentation

## 2019-04-26 DIAGNOSIS — C50411 Malignant neoplasm of upper-outer quadrant of right female breast: Secondary | ICD-10-CM

## 2019-04-26 NOTE — Progress Notes (Signed)
Patient stated that she had been doing well with no complaints. Patient stated that she forgets to do her monthly self-breast exams. Patient denied nipple discharge, skin discoloration or pain. Patient had her mammogram done on 03/21/2019 and bone density 07/12/2018.

## 2019-05-02 DIAGNOSIS — E039 Hypothyroidism, unspecified: Secondary | ICD-10-CM | POA: Diagnosis not present

## 2019-05-02 DIAGNOSIS — I10 Essential (primary) hypertension: Secondary | ICD-10-CM | POA: Diagnosis not present

## 2019-05-02 DIAGNOSIS — I82402 Acute embolism and thrombosis of unspecified deep veins of left lower extremity: Secondary | ICD-10-CM | POA: Diagnosis not present

## 2019-05-02 DIAGNOSIS — E7800 Pure hypercholesterolemia, unspecified: Secondary | ICD-10-CM | POA: Diagnosis not present

## 2019-05-02 DIAGNOSIS — R7309 Other abnormal glucose: Secondary | ICD-10-CM | POA: Diagnosis not present

## 2019-05-02 DIAGNOSIS — Z79899 Other long term (current) drug therapy: Secondary | ICD-10-CM | POA: Diagnosis not present

## 2019-05-02 DIAGNOSIS — C50919 Malignant neoplasm of unspecified site of unspecified female breast: Secondary | ICD-10-CM | POA: Diagnosis not present

## 2019-06-22 DIAGNOSIS — L7682 Other postprocedural complications of skin and subcutaneous tissue: Secondary | ICD-10-CM | POA: Diagnosis not present

## 2019-07-04 ENCOUNTER — Other Ambulatory Visit: Payer: Self-pay

## 2019-07-04 ENCOUNTER — Ambulatory Visit
Admission: RE | Admit: 2019-07-04 | Discharge: 2019-07-04 | Disposition: A | Discharge status: Home / Self Care | Payer: PPO | Source: Ambulatory Visit | Attending: Radiation Oncology | Admitting: Radiation Oncology

## 2019-07-04 ENCOUNTER — Encounter: Payer: Self-pay | Admitting: Radiation Oncology

## 2019-07-04 VITALS — BP 140/82 | HR 80 | Temp 97.2°F | Wt 207.1 lb

## 2019-07-04 DIAGNOSIS — Z923 Personal history of irradiation: Secondary | ICD-10-CM | POA: Insufficient documentation

## 2019-07-04 DIAGNOSIS — C50411 Malignant neoplasm of upper-outer quadrant of right female breast: Secondary | ICD-10-CM | POA: Diagnosis not present

## 2019-07-04 DIAGNOSIS — Z79811 Long term (current) use of aromatase inhibitors: Secondary | ICD-10-CM | POA: Diagnosis not present

## 2019-07-04 DIAGNOSIS — Z170 Estrogen receptor positive status [ER+]: Secondary | ICD-10-CM | POA: Insufficient documentation

## 2019-07-04 NOTE — Progress Notes (Signed)
Radiation Oncology Follow up Note  Name: Susan Myers   Date:   07/04/2019 MRN:  [id number] DOB: May 04, 1951    This 68 y.o. female presents to the clinic today for 1 year follow-up status post whole breast radiation to her right breast for stage I ER PR positive invasive mammary carcinoma.  REFERRING PROVIDER: Idelle Crouch, MD  HPI: Patient is a 68 year old female now out 1 year having completed whole breast radiation to her right breast for stage I ER PR positive invasive mammary carcinoma.  Seen today in routine follow-up she is doing well.  She specifically denies breast tenderness cough or bone pain..  She is currently on letrozole tolerating that well without side effect.  She had mammograms back in April which I have reviewed were BI-RADS 2 benign.  COMPLICATIONS OF TREATMENT: none  FOLLOW UP COMPLIANCE: keeps appointments   PHYSICAL EXAM:  BP 140/82   Pulse 80   Temp (!) 97.2 F (36.2 C)   Wt 207 lb 1.6 oz (93.9 kg)   BMI 36.69 kg/m  Lungs are clear to A&P cardiac examination essentially unremarkable with regular rate and rhythm. No dominant mass or nodularity is noted in either breast in 2 positions examined. Incision is well-healed. No axillary or supraclavicular adenopathy is appreciated. Cosmetic result is excellent.  Right breast is somewhat retracted from the left although cosmetic result is still good to excellent.  Well-developed well-nourished patient in NAD. HEENT reveals PERLA, EOMI, discs not visualized.  Oral cavity is clear. No oral mucosal lesions are identified. Neck is clear without evidence of cervical or supraclavicular adenopathy. Lungs are clear to A&P. Cardiac examination is essentially unremarkable with regular rate and rhythm without murmur rub or thrill. Abdomen is benign with no organomegaly or masses noted. Motor sensory and DTR levels are equal and symmetric in the upper and lower extremities. Cranial nerves II through XII are grossly intact.  Proprioception is intact. No peripheral adenopathy or edema is identified. No motor or sensory levels are noted. Crude visual fields are within normal range.  RADIOLOGY RESULTS: Mammograms are reviewed compatible with above-stated findings  PLAN: Present time patient continues to do well with no evidence of disease.  I have asked to see her out in 1 year for follow-up.  Patient is to call at anytime with any concerns.  She is already scheduled for follow-up mammograms..  She continues on letrozole without side effect.  I would like to take this opportunity to thank you for allowing me to participate in the care of your patient.[name], MD

## 2019-07-14 ENCOUNTER — Other Ambulatory Visit: Payer: Self-pay

## 2019-07-14 ENCOUNTER — Ambulatory Visit
Admission: RE | Admit: 2019-07-14 | Discharge: 2019-07-14 | Disposition: A | Discharge status: Home / Self Care | Payer: PPO | Source: Ambulatory Visit | Attending: Oncology | Admitting: Oncology

## 2019-07-14 DIAGNOSIS — Z923 Personal history of irradiation: Secondary | ICD-10-CM | POA: Diagnosis not present

## 2019-07-14 DIAGNOSIS — Z780 Asymptomatic menopausal state: Secondary | ICD-10-CM | POA: Insufficient documentation

## 2019-07-14 DIAGNOSIS — Z13820 Encounter for screening for osteoporosis: Secondary | ICD-10-CM | POA: Insufficient documentation

## 2019-07-14 DIAGNOSIS — C50411 Malignant neoplasm of upper-outer quadrant of right female breast: Secondary | ICD-10-CM | POA: Insufficient documentation

## 2019-07-14 DIAGNOSIS — M85851 Other specified disorders of bone density and structure, right thigh: Secondary | ICD-10-CM | POA: Diagnosis not present

## 2019-08-03 DIAGNOSIS — R234 Changes in skin texture: Secondary | ICD-10-CM | POA: Diagnosis not present

## 2019-08-03 DIAGNOSIS — L98499 Non-pressure chronic ulcer of skin of other sites with unspecified severity: Secondary | ICD-10-CM | POA: Diagnosis not present

## 2019-08-08 DIAGNOSIS — Z79899 Other long term (current) drug therapy: Secondary | ICD-10-CM | POA: Diagnosis not present

## 2019-08-08 DIAGNOSIS — R7309 Other abnormal glucose: Secondary | ICD-10-CM | POA: Diagnosis not present

## 2019-08-08 DIAGNOSIS — E039 Hypothyroidism, unspecified: Secondary | ICD-10-CM | POA: Diagnosis not present

## 2019-08-08 DIAGNOSIS — I10 Essential (primary) hypertension: Secondary | ICD-10-CM | POA: Diagnosis not present

## 2019-08-08 DIAGNOSIS — R8290 Unspecified abnormal findings in urine: Secondary | ICD-10-CM | POA: Diagnosis not present

## 2019-08-08 DIAGNOSIS — E7800 Pure hypercholesterolemia, unspecified: Secondary | ICD-10-CM | POA: Diagnosis not present

## 2019-08-15 DIAGNOSIS — E7800 Pure hypercholesterolemia, unspecified: Secondary | ICD-10-CM | POA: Diagnosis not present

## 2019-08-15 DIAGNOSIS — C50919 Malignant neoplasm of unspecified site of unspecified female breast: Secondary | ICD-10-CM | POA: Diagnosis not present

## 2019-08-15 DIAGNOSIS — Z79899 Other long term (current) drug therapy: Secondary | ICD-10-CM | POA: Diagnosis not present

## 2019-08-15 DIAGNOSIS — Z0000 Encounter for general adult medical examination without abnormal findings: Secondary | ICD-10-CM | POA: Diagnosis not present

## 2019-08-15 DIAGNOSIS — I82512 Chronic embolism and thrombosis of left femoral vein: Secondary | ICD-10-CM | POA: Diagnosis not present

## 2019-08-15 DIAGNOSIS — I10 Essential (primary) hypertension: Secondary | ICD-10-CM | POA: Diagnosis not present

## 2019-08-15 DIAGNOSIS — E039 Hypothyroidism, unspecified: Secondary | ICD-10-CM | POA: Diagnosis not present

## 2019-08-15 DIAGNOSIS — R7309 Other abnormal glucose: Secondary | ICD-10-CM | POA: Diagnosis not present

## 2019-08-15 DIAGNOSIS — I82402 Acute embolism and thrombosis of unspecified deep veins of left lower extremity: Secondary | ICD-10-CM | POA: Diagnosis not present

## 2019-08-15 DIAGNOSIS — J9811 Atelectasis: Secondary | ICD-10-CM | POA: Diagnosis not present

## 2019-08-17 DIAGNOSIS — L98499 Non-pressure chronic ulcer of skin of other sites with unspecified severity: Secondary | ICD-10-CM | POA: Diagnosis not present

## 2019-09-01 DIAGNOSIS — L98499 Non-pressure chronic ulcer of skin of other sites with unspecified severity: Secondary | ICD-10-CM | POA: Diagnosis not present

## 2019-09-15 DIAGNOSIS — L98499 Non-pressure chronic ulcer of skin of other sites with unspecified severity: Secondary | ICD-10-CM | POA: Diagnosis not present

## 2019-09-29 DIAGNOSIS — L98499 Non-pressure chronic ulcer of skin of other sites with unspecified severity: Secondary | ICD-10-CM | POA: Diagnosis not present

## 2019-10-12 DIAGNOSIS — L98499 Non-pressure chronic ulcer of skin of other sites with unspecified severity: Secondary | ICD-10-CM | POA: Diagnosis not present

## 2019-10-26 DIAGNOSIS — L98499 Non-pressure chronic ulcer of skin of other sites with unspecified severity: Secondary | ICD-10-CM | POA: Diagnosis not present

## 2019-10-30 NOTE — Progress Notes (Signed)
[place]  [phone] [phone]) [phone]  ID: Susan Myers OB: December 09, 1950  MR#: [id number]  TJQ#:[id number]  Patient Care Team: Idelle Crouch, MD as PCP - General (Internal Medicine)  I connected with Susan Myers on 11/01/19 at 10:45 AM EST by video enabled telemedicine visit and verified that I am speaking with the correct person using two identifiers.   I discussed the limitations, risks, security and privacy concerns of performing an evaluation and management service by telemedicine and the availability of in-person appointments. I also discussed with the patient that there may be a patient responsible charge related to this service. The patient expressed understanding and agreed to proceed.   Other persons participating in the visit and their role in the encounter: Patient, MD  Patient's location: Home Provider's location: Clinic  CHIEF COMPLAINT: Clinical stage IA ER/PR positive, HER-2 negative invasive carcinoma of the upper outer quadrant of the right breast.  INTERVAL HISTORY: Patient agreed to video enabled telemedicine visit for her routine 1-monthevaluation.  She continues to feel well and remains asymptomatic.  She continues to tolerate letrozole well without significant side effects.  She has no neurologic complaints.  She denies any recent fevers or illnesses.  She has a good appetite and denies weight loss.  She denies any chest pain, shortness of breath, cough, or hemoptysis.  She denies any nausea, vomiting, constipation, or diarrhea.  She has no urinary complaints.  Patient offers no specific complaints today.  REVIEW OF SYSTEMS:   Review of Systems  Constitutional: Negative.  Negative for fever, malaise/fatigue and weight loss.  Respiratory: Negative.  Negative for cough and shortness of breath.   Cardiovascular: Negative.  Negative for chest pain and leg swelling.  Gastrointestinal: Negative.  Negative for abdominal  pain.  Genitourinary: Negative.  Negative for dysuria.  Musculoskeletal: Negative.  Negative for back pain.  Skin: Negative.  Negative for rash.  Neurological: Negative.  Negative for sensory change, focal weakness and weakness.  Psychiatric/Behavioral: Negative.  The patient is not nervous/anxious.     As per HPI. Otherwise, a complete review of systems is negative.  PAST MEDICAL HISTORY: Past Medical History:  Diagnosis Date  . [place]   invasive mammary carcinoma  . Complication of anesthesia   . [place]   . D[place]) [place] 06[place]  . GERD (gastroesophageal reflux disease)   . History of kidney stones   . Hx of blood clots   . Hypercholesterolemia   . Hypothyroidism   . Kidney stones   . Personal history of radiation therapy 2019   right breast cancer  . PONV (postoperative nausea and vomiting)   . Thyroid disease     PAST SURGICAL HISTORY: Past Surgical History:  Procedure Laterality Date  . ABDOMINAL HYSTERECTOMY    . APPENDECTOMY    . BREAST BIOPSY Right 03/23/2018   Affirm Bx- invasive mammary carcinoma  . BREAST CYST ASPIRATION Bilateral 1990  . BREAST LUMPECTOMY Right 2019   invasive mammary carcinoma  . COLONOSCOPY    . IVC FILTER INSERTION    . PARTIAL MASTECTOMY WITH NEEDLE LOCALIZATION Right 04/01/2018   Procedure: PARTIAL MASTECTOMY WITH NEEDLE LOCALIZATION;  Surgeon: CHerbert Pun MD;  Location: [facility];  Service: General;  Laterality: Right;  . PERIPHERAL VASCULAR CATHETERIZATION N/A 08/06/2016   Procedure: IVC Filter Insertion;  Surgeon: GKatha Cabal MD;  [facility];  Service: Cardiovascular;  Laterality: N/A;  . PERIPHERAL VASCULAR  CATHETERIZATION Left 08/06/2016   Procedure: Lower Extremity Venography;  Surgeon: Katha Cabal, MD;  Location: [place] [facility];  Service: Cardiovascular;  Laterality: Left;  . PERIPHERAL VASCULAR CATHETERIZATION  08/06/2016    Procedure: Lower Extremity Intervention;  Surgeon: Katha Cabal, MD;  Location: [place] [facility];  Service: Cardiovascular;;  . PERIPHERAL VASCULAR CATHETERIZATION N/A 12/16/2016   Procedure: IVC Filter Removal;  Surgeon: Katha Cabal, MD;  Location: [place] [facility];  Service: Cardiovascular;  Laterality: N/A;  . SENTINEL NODE BIOPSY Right 04/01/2018   Procedure: SENTINEL NODE BIOPSY;  Surgeon: Herbert Pun, MD;  Location: [facility];  Service: General;  Laterality: Right;  . TONSILLECTOMY      FAMILY HISTORY: Family History  Problem Relation Age of Onset  . Breast cancer Neg Hx   . Prostate cancer Neg Hx   . Kidney cancer Neg Hx     ADVANCED DIRECTIVES (Y/N):  N  HEALTH MAINTENANCE: Social History   Tobacco Use  . Smoking status: Never Smoker  . Smokeless tobacco: Never Used  Substance Use Topics  . Alcohol use: No  . Drug use: No     Colonoscopy:  PAP:  Bone density:  Lipid panel:  Allergies  Allergen Reactions  . Levaquin [Levofloxacin In D5w] Hives  . Macrolides And Ketolides Other (See Comments)    WHELPS WHELPS WHELPS  . Penicillins Itching    Can take amoxicillin  Has patient had a PCN reaction causing immediate rash, facial/tongue/throat swelling, SOB or lightheadedness with hypotension:No Has patient had a PCN reaction causing severe rash involving mucus membranes or skin necrosis:unsure Has patient had a PCN reaction that required hospitalization:No Has patient had a PCN reaction occurring within the last 10 years: No If all of the above answers are "NO", then may proceed with Cephalosporin use.  . Sulfa Antibiotics Hives    Current Outpatient Medications  Medication Sig Dispense Refill  . acetaminophen (TYLENOL) 500 MG tablet Take 500 mg by mouth every 6 (six) hours as needed for moderate pain or headache.    . Cholecalciferol (VITAMIN D PO) Take 1 tablet by mouth once a week.    . diphenhydrAMINE (BENADRYL) 25 MG tablet Take 25  mg by mouth at bedtime as needed for sleep.    . hydrochlorothiazide (HYDRODIURIL) 25 MG tablet Take 25 mg by mouth daily.    Marland Kitchen levothyroxine (SYNTHROID) 100 MCG tablet Take 100 mcg by mouth daily.    . meloxicam (MOBIC) 7.5 MG tablet Take 7.5 mg by mouth daily as needed for pain.    Marland Kitchen omeprazole (PRILOSEC) 20 MG capsule Take 20 mg by mouth daily as needed (acid reflux).     . phenylephrine (SUDAFED PE) 10 MG TABS tablet Take 10 mg by mouth every 6 (six) hours as needed (congestion).    . risperiDONE (RISPERDAL) 1 MG tablet Take 1 mg by mouth at bedtime.    . rivaroxaban (XARELTO) 20 MG TABS tablet Take 20 mg by mouth daily.    . simvastatin (ZOCOR) 20 MG tablet Take 20 mg by mouth every evening.     Marland Kitchen letrozole (FEMARA) 2.5 MG tablet Take 1 tablet (2.5 mg total) by mouth daily. 90 tablet 3  . levothyroxine (SYNTHROID, LEVOTHROID) 125 MCG tablet      No current facility-administered medications for this visit.     OBJECTIVE: There were no vitals filed for this visit.   There is no height or weight on file to calculate BMI.  ECOG FS:0 - Asymptomatic  General: Well-developed, well-nourished, no acute distress. HEENT: Normocephalic. Neuro: Alert, answering all questions appropriately. Cranial nerves grossly intact. Psych: Normal affect.  LAB RESULTS:  Lab Results  Component Value Date   NA 143 11/23/2015   K 3.4 (L) 11/23/2015   CL 105 11/23/2015   CO2 28 11/23/2015   GLUCOSE 101 (H) 11/23/2015   BUN 23 (H) 08/06/2016   CREATININE 0.88 08/06/2016   CALCIUM 9.7 11/23/2015   PROT 7.5 11/23/2015   ALBUMIN 4.8 11/23/2015   AST 22 11/23/2015   ALT 24 11/23/2015   ALKPHOS 88 11/23/2015   BILITOT 0.8 11/23/2015   GFRNONAA >60 08/06/2016   GFRAA >60 08/06/2016    Lab Results  Component Value Date   WBC 5.5 06/15/2018   HGB 14.5 06/15/2018   HCT 42.7 06/15/2018   MCV 93.7 06/15/2018   PLT 168 06/15/2018     STUDIES: No results found.  ASSESSMENT: Clinical stage IA  ER/PR positive, HER-2 negative invasive carcinoma of the upper outer quadrant of the right breast.  Oncotype DX score 6, low risk.  PLAN:    1. Clinical stage IA ER/PR positive, HER-2 negative invasive carcinoma of the upper outer quadrant of the right breast: Patient underwent lumpectomy on Apr 01, 2018 confirming the staging of disease.  She had an Oncotype DX score of 6 which confers a low risk therefore patient did not require adjuvant chemotherapy.  She completed adjuvant XRT in approximately August 2019.  Continue letrozole for a total of 5 years completing in August 2024. Her most recent mammogram on March 18, 2019 was reported as BI-RADS 2.  Repeat in April 2021.  Return to clinic in 6 months with video assisted telemedicine visit and further evaluation. 2.  Osteopenia: Repeat bone mineral density on July 14, 2019 reported T score of -1.8.  This is mildly improved from 1 year prior when the T score was reported at -1.9.  Continue calcium and vitamin D supplementation.  Repeat in August 2021.    I provided 15 minutes of face-to-face video visit time during this encounter, and > 50% was spent counseling as documented under my assessment & plan.   Patient expressed understanding and was in agreement with this plan. She also understands that She can call clinic at any time with any questions, concerns, or complaints.   Cancer Staging Primary cancer of upper outer quadrant of right female breast [facility]) Staging form: Breast, AJCC 8th Edition - Clinical stage from 03/26/2018: Stage IA (cT1b, cN0, cM0, G2, ER+, PR+, HER2-) - Signed by Lloyd Huger, MD on 03/26/2018   Lloyd Huger, MD   11/01/2019 4:14 PM

## 2019-11-01 ENCOUNTER — Inpatient Hospital Stay: Payer: PPO | Attending: Oncology | Admitting: Oncology

## 2019-11-01 ENCOUNTER — Encounter: Payer: Self-pay | Admitting: Oncology

## 2019-11-01 ENCOUNTER — Other Ambulatory Visit: Payer: Self-pay | Admitting: Emergency Medicine

## 2019-11-01 DIAGNOSIS — C50411 Malignant neoplasm of upper-outer quadrant of right female breast: Secondary | ICD-10-CM | POA: Diagnosis not present

## 2019-11-01 DIAGNOSIS — C50911 Malignant neoplasm of unspecified site of right female breast: Secondary | ICD-10-CM

## 2019-11-01 MED ORDER — LETROZOLE 2.5 MG PO TABS: 2.5000 mg | ORAL_TABLET | Freq: Every day | ORAL | 3 refills | Status: DC

## 2019-11-01 NOTE — Progress Notes (Signed)
Patient prescreened for appointment. Patient has no concerns or questions.  

## 2019-11-09 DIAGNOSIS — L98499 Non-pressure chronic ulcer of skin of other sites with unspecified severity: Secondary | ICD-10-CM | POA: Diagnosis not present

## 2019-12-07 DIAGNOSIS — Z872 Personal history of diseases of the skin and subcutaneous tissue: Secondary | ICD-10-CM | POA: Diagnosis not present

## 2019-12-07 DIAGNOSIS — L98499 Non-pressure chronic ulcer of skin of other sites with unspecified severity: Secondary | ICD-10-CM | POA: Diagnosis not present

## 2019-12-07 DIAGNOSIS — L219 Seborrheic dermatitis, unspecified: Secondary | ICD-10-CM | POA: Diagnosis not present

## 2019-12-21 DIAGNOSIS — Z872 Personal history of diseases of the skin and subcutaneous tissue: Secondary | ICD-10-CM | POA: Diagnosis not present

## 2019-12-21 DIAGNOSIS — L98499 Non-pressure chronic ulcer of skin of other sites with unspecified severity: Secondary | ICD-10-CM | POA: Diagnosis not present

## 2019-12-21 DIAGNOSIS — L219 Seborrheic dermatitis, unspecified: Secondary | ICD-10-CM | POA: Diagnosis not present

## 2020-01-04 DIAGNOSIS — L98499 Non-pressure chronic ulcer of skin of other sites with unspecified severity: Secondary | ICD-10-CM | POA: Diagnosis not present

## 2020-01-04 DIAGNOSIS — Z872 Personal history of diseases of the skin and subcutaneous tissue: Secondary | ICD-10-CM | POA: Diagnosis not present

## 2020-01-18 DIAGNOSIS — L98499 Non-pressure chronic ulcer of skin of other sites with unspecified severity: Secondary | ICD-10-CM | POA: Diagnosis not present

## 2020-01-18 DIAGNOSIS — L570 Actinic keratosis: Secondary | ICD-10-CM | POA: Diagnosis not present

## 2020-01-19 ENCOUNTER — Ambulatory Visit (INDEPENDENT_AMBULATORY_CARE_PROVIDER_SITE_OTHER): Payer: PPO | Admitting: Vascular Surgery

## 2020-01-19 ENCOUNTER — Other Ambulatory Visit: Payer: Self-pay

## 2020-01-19 ENCOUNTER — Ambulatory Visit (INDEPENDENT_AMBULATORY_CARE_PROVIDER_SITE_OTHER): Payer: PPO

## 2020-01-19 ENCOUNTER — Encounter (INDEPENDENT_AMBULATORY_CARE_PROVIDER_SITE_OTHER): Payer: Self-pay | Admitting: Vascular Surgery

## 2020-01-19 VITALS — BP 132/83 | HR 76 | Resp 12 | Ht 62.0 in | Wt 211.0 lb

## 2020-01-19 DIAGNOSIS — I82513 Chronic embolism and thrombosis of femoral vein, bilateral: Secondary | ICD-10-CM

## 2020-01-19 DIAGNOSIS — I890 Lymphedema, not elsewhere classified: Secondary | ICD-10-CM | POA: Diagnosis not present

## 2020-01-19 DIAGNOSIS — I82512 Chronic embolism and thrombosis of left femoral vein: Secondary | ICD-10-CM

## 2020-01-19 DIAGNOSIS — I872 Venous insufficiency (chronic) (peripheral): Secondary | ICD-10-CM

## 2020-01-19 DIAGNOSIS — I10 Essential (primary) hypertension: Secondary | ICD-10-CM | POA: Diagnosis not present

## 2020-01-19 DIAGNOSIS — E7800 Pure hypercholesterolemia, unspecified: Secondary | ICD-10-CM

## 2020-01-19 NOTE — Progress Notes (Signed)
MRN : [id number]  Susan Myers is a 69 y.o. ([birth year]) female who presents with chief complaint of  Chief Complaint  Patient presents with  . Follow-up    U/S follow up  .  History of Present Illness:   The patient returns to the office for followupregarding her IVC filter. She is status post angiogram withattempted IVC filter removalon 12/16/2016. Hematuria has not recurred and she is now back on her Xarelto. Swelling of her left legremainsstable and "not much" at this time.  There have been no significant changes to the patient's overall health care.  The patient denies amaurosis fugax or recent TIA symptoms. There are no recent neurological changes noted. The patient denies history of DVT, PE or superficial thrombophlebitis. The patient denies recent episodes of angina or shortness of breath.  Duplex ultrasound of the IVC is negative for clot normal flow pattern is identified  Current Meds  Medication Sig  . acetaminophen (TYLENOL) 500 MG tablet Take 500 mg by mouth every 6 (six) hours as needed for moderate pain or headache.  . Cholecalciferol (VITAMIN D PO) Take 1 tablet by mouth once a week.  . diphenhydrAMINE (BENADRYL) 25 MG tablet Take 25 mg by mouth at bedtime as needed for sleep.  . hydrochlorothiazide (HYDRODIURIL) 25 MG tablet Take 25 mg by mouth daily.  Marland Kitchen ketoconazole (NIZORAL) 2 % shampoo   . letrozole (FEMARA) 2.5 MG tablet Take 1 tablet (2.5 mg total) by mouth daily.  Marland Kitchen levothyroxine (SYNTHROID) 100 MCG tablet Take 100 mcg by mouth daily.  Marland Kitchen levothyroxine (SYNTHROID, LEVOTHROID) 125 MCG tablet   . meloxicam (MOBIC) 7.5 MG tablet Take 7.5 mg by mouth daily as needed for pain.  . phenylephrine (SUDAFED PE) 10 MG TABS tablet Take 10 mg by mouth every 6 (six) hours as needed (congestion).  . risperiDONE (RISPERDAL) 1 MG tablet Take 1 mg by mouth at bedtime.  . rivaroxaban (XARELTO) 20 MG TABS tablet Take 20 mg by mouth daily.  . simvastatin  (ZOCOR) 20 MG tablet Take 20 mg by mouth every evening.   . timolol (TIMOPTIC) 0.5 % ophthalmic solution 1 drop daily.    Past Medical History:  Diagnosis Date  . Breast cancer ([place]) 2019   invasive mammary carcinoma  . Complication of anesthesia   . Delusional disorder ([place])   . DVT (deep venous thrombosis) ([place]) 06/2016  . GERD (gastroesophageal reflux disease)   . History of kidney stones   . Hx of blood clots   . Hypercholesterolemia   . Hypothyroidism   . Kidney stones   . Personal history of radiation therapy 2019   right breast cancer  . PONV (postoperative nausea and vomiting)   . Thyroid disease     Past Surgical History:  Procedure Laterality Date  . ABDOMINAL HYSTERECTOMY    . APPENDECTOMY    . BREAST BIOPSY Right 03/23/2018   Affirm Bx- invasive mammary carcinoma  . BREAST CYST ASPIRATION Bilateral 1990  . BREAST LUMPECTOMY Right 2019   invasive mammary carcinoma  . COLONOSCOPY    . IVC FILTER INSERTION    . PARTIAL MASTECTOMY WITH NEEDLE LOCALIZATION Right 04/01/2018   Procedure: PARTIAL MASTECTOMY WITH NEEDLE LOCALIZATION;  Surgeon: Herbert Pun, MD;  Location: [facility];  Service: General;  Laterality: Right;  . PERIPHERAL VASCULAR CATHETERIZATION N/A 08/06/2016   Procedure: IVC Filter Insertion;  Surgeon: Katha Cabal, MD;  Location: [place] [facility];  Service: Cardiovascular;  Laterality: N/A;  . PERIPHERAL VASCULAR  CATHETERIZATION Left 08/06/2016   Procedure: Lower Extremity Venography;  Surgeon: Katha Cabal, MD;  Location: [place] [facility];  Service: Cardiovascular;  Laterality: Left;  . PERIPHERAL VASCULAR CATHETERIZATION  08/06/2016   Procedure: Lower Extremity Intervention;  Surgeon: Katha Cabal, MD;  Location: [place] [facility];  Service: Cardiovascular;;  . PERIPHERAL VASCULAR CATHETERIZATION N/A 12/16/2016   Procedure: IVC Filter Removal;  Surgeon: Katha Cabal, MD;  Location: [place] [facility];  Service:  Cardiovascular;  Laterality: N/A;  . SENTINEL NODE BIOPSY Right 04/01/2018   Procedure: SENTINEL NODE BIOPSY;  Surgeon: Herbert Pun, MD;  Location: [facility];  Service: General;  Laterality: Right;  . TONSILLECTOMY      Social History Social History   Tobacco Use  . Smoking status: Never Smoker  . Smokeless tobacco: Never Used  Substance Use Topics  . Alcohol use: No  . Drug use: No    Family History Family History  Problem Relation Age of Onset  . Breast cancer Neg Hx   . Prostate cancer Neg Hx   . Kidney cancer Neg Hx     Allergies  Allergen Reactions  . Levaquin [Levofloxacin In D5w] Hives  . Macrolides And Ketolides Other (See Comments)    WHELPS WHELPS WHELPS  . Penicillins Itching    Can take amoxicillin  Has patient had a PCN reaction causing immediate rash, facial/tongue/throat swelling, SOB or lightheadedness with hypotension:No Has patient had a PCN reaction causing severe rash involving mucus membranes or skin necrosis:unsure Has patient had a PCN reaction that required hospitalization:No Has patient had a PCN reaction occurring within the last 10 years: No If all of the above answers are "NO", then may proceed with Cephalosporin use.  . Sulfa Antibiotics Hives     REVIEW OF SYSTEMS (Negative unless checked)  Constitutional: [] Weight loss  [] Fever  [] Chills Cardiac: [] Chest pain   [] Chest pressure   [] Palpitations   [] Shortness of breath when laying flat   [] Shortness of breath with exertion. Vascular:  [] Pain in legs with walking   [x] Pain in legs at rest  [] History of DVT   [] Phlebitis   [x] Swelling in legs   [] Varicose veins   [] Non-healing ulcers Pulmonary:   [] Uses home oxygen   [] Productive cough   [] Hemoptysis   [] Wheeze  [] COPD   [] Asthma Neurologic:  [] Dizziness   [] Seizures   [] History of stroke   [] History of TIA  [] Aphasia   [] Vissual changes   [] Weakness or numbness in arm   [] Weakness or numbness in leg Musculoskeletal:   [] Joint  swelling   [] Joint pain   [] Low back pain Hematologic:  [] Easy bruising  [] Easy bleeding   [] Hypercoagulable state   [] Anemic Gastrointestinal:  [] Diarrhea   [] Vomiting  [] Gastroesophageal reflux/heartburn   [] Difficulty swallowing. Genitourinary:  [] Chronic kidney disease   [] Difficult urination  [] Frequent urination   [] Blood in urine Skin:  [] Rashes   [] Ulcers  Psychological:  [] History of anxiety   []  History of major depression.  Physical Examination  Vitals:   01/19/20 0819  BP: 132/83  Pulse: 76  Resp: 12  Weight: 211 lb (95.7 kg)  Height: 5\' 2"  (1.575 m)   Body mass index is 38.59 kg/m. Gen: WD/WN, NAD Head: [place]/AT, No temporalis wasting.  Ear/Nose/Throat: Hearing grossly intact, nares w/o erythema or drainage Eyes: PER, EOMI, sclera nonicteric.  Neck: Supple, no large masses.   Pulmonary:  Good air movement, no audible wheezing bilaterally, no use of accessory muscles.  Cardiac: RRR,  no JVD Vascular: scattered varicosities present bilaterally.  Mild venous stasis changes to the legs bilaterally.  2-3+ soft pitting edema left greater than right Gastrointestinal: Non-distended. No guarding/no peritoneal signs.  Musculoskeletal: M/S 5/5 throughout.  No deformity or atrophy.  Neurologic: CN 2-12 intact. Symmetrical.  Speech is fluent. Motor exam as listed above. Psychiatric: Judgment intact, Mood & affect appropriate for pt's clinical situation. Dermatologic: No rashes or ulcers noted.  No changes consistent with cellulitis. Lymph : No lichenification or skin changes of chronic lymphedema.  CBC Lab Results  Component Value Date   WBC 5.5 06/15/2018   HGB 14.5 06/15/2018   HCT 42.7 06/15/2018   MCV 93.7 06/15/2018   PLT 168 06/15/2018    BMET    Component Value Date/Time   NA 143 11/23/2015 2103   K 3.4 (L) 11/23/2015 2103   CL 105 11/23/2015 2103   CO2 28 11/23/2015 2103   GLUCOSE 101 (H) 11/23/2015 2103   BUN 23 (H) 08/06/2016 1426   CREATININE 0.88  08/06/2016 1426   CALCIUM 9.7 11/23/2015 2103   GFRNONAA >60 08/06/2016 1426   GFRAA >60 08/06/2016 1426   CrCl cannot be calculated (Patient's most recent lab result is older than the maximum 21 days allowed.).  COAG Lab Results  Component Value Date   INR 1.33 08/06/2016    Radiology No results found.   Assessment/Plan 1. Chronic deep vein thrombosis (DVT) of femoral vein of both lower extremities (HCC) Recommend:   No surgery or intervention at this point in time.  IVC filter is notable to be removed and will therefore remain.  Patient's duplex ultrasound of the IVC shows normal flow pattern.  The patientwill continueon anticoagulation   Elevation was stressed, use of a recliner was discussed.  I have had a long discussion with the patient regarding DVT and post phlebitic changes such as swelling and why it causes symptoms such as pain. The patient will wear graduated compression stockings class 1 (20-30 mmHg), beginning after three full days of anticoagulation, on a daily basis a prescription was given. The patient will beginning wearing the stockings first thing in the morning and removing them in the evening. The patient is instructed specifically not to sleep in the stockings. In addition, behavioral modification including elevation during the day and avoidance of prolonged dependency will be initiated.   The patient will continue anticoagulation for now as there have not been any problems or complications at this point.  - VAS US AORTA/IVC/ILIACS; Future  2. Chronic venous insufficiency See #1  3. Lymphedema See #1  4. HTN, goal below 140/80 Continue antihypertensive medications as already ordered, these medications have been reviewed and there are no changes at this time.   5. Hypercholesteremia Continue statin as ordered and reviewed, no changes at this time    Hortencia Pilar, MD  01/19/2020 8:41 AM

## 2020-02-01 DIAGNOSIS — L98499 Non-pressure chronic ulcer of skin of other sites with unspecified severity: Secondary | ICD-10-CM | POA: Diagnosis not present

## 2020-02-01 DIAGNOSIS — Z872 Personal history of diseases of the skin and subcutaneous tissue: Secondary | ICD-10-CM | POA: Diagnosis not present

## 2020-02-02 DIAGNOSIS — I10 Essential (primary) hypertension: Secondary | ICD-10-CM | POA: Diagnosis not present

## 2020-02-02 DIAGNOSIS — E039 Hypothyroidism, unspecified: Secondary | ICD-10-CM | POA: Diagnosis not present

## 2020-02-02 DIAGNOSIS — Z79899 Other long term (current) drug therapy: Secondary | ICD-10-CM | POA: Diagnosis not present

## 2020-02-02 DIAGNOSIS — E7800 Pure hypercholesterolemia, unspecified: Secondary | ICD-10-CM | POA: Diagnosis not present

## 2020-02-02 DIAGNOSIS — R7309 Other abnormal glucose: Secondary | ICD-10-CM | POA: Diagnosis not present

## 2020-02-09 DIAGNOSIS — R7309 Other abnormal glucose: Secondary | ICD-10-CM | POA: Diagnosis not present

## 2020-02-09 DIAGNOSIS — C50919 Malignant neoplasm of unspecified site of unspecified female breast: Secondary | ICD-10-CM | POA: Diagnosis not present

## 2020-02-09 DIAGNOSIS — Z0000 Encounter for general adult medical examination without abnormal findings: Secondary | ICD-10-CM | POA: Diagnosis not present

## 2020-02-09 DIAGNOSIS — E039 Hypothyroidism, unspecified: Secondary | ICD-10-CM | POA: Diagnosis not present

## 2020-02-09 DIAGNOSIS — E7800 Pure hypercholesterolemia, unspecified: Secondary | ICD-10-CM | POA: Diagnosis not present

## 2020-02-09 DIAGNOSIS — I82512 Chronic embolism and thrombosis of left femoral vein: Secondary | ICD-10-CM | POA: Diagnosis not present

## 2020-02-09 DIAGNOSIS — Z1211 Encounter for screening for malignant neoplasm of colon: Secondary | ICD-10-CM | POA: Diagnosis not present

## 2020-02-09 DIAGNOSIS — I10 Essential (primary) hypertension: Secondary | ICD-10-CM | POA: Diagnosis not present

## 2020-02-09 DIAGNOSIS — Z79899 Other long term (current) drug therapy: Secondary | ICD-10-CM | POA: Diagnosis not present

## 2020-02-13 DIAGNOSIS — Z0000 Encounter for general adult medical examination without abnormal findings: Secondary | ICD-10-CM | POA: Diagnosis not present

## 2020-02-13 DIAGNOSIS — Z1211 Encounter for screening for malignant neoplasm of colon: Secondary | ICD-10-CM | POA: Diagnosis not present

## 2020-02-13 DIAGNOSIS — R7309 Other abnormal glucose: Secondary | ICD-10-CM | POA: Diagnosis not present

## 2020-02-13 DIAGNOSIS — I10 Essential (primary) hypertension: Secondary | ICD-10-CM | POA: Diagnosis not present

## 2020-02-13 DIAGNOSIS — I82512 Chronic embolism and thrombosis of left femoral vein: Secondary | ICD-10-CM | POA: Diagnosis not present

## 2020-02-13 DIAGNOSIS — E7800 Pure hypercholesterolemia, unspecified: Secondary | ICD-10-CM | POA: Diagnosis not present

## 2020-02-13 DIAGNOSIS — E039 Hypothyroidism, unspecified: Secondary | ICD-10-CM | POA: Diagnosis not present

## 2020-02-13 DIAGNOSIS — Z79899 Other long term (current) drug therapy: Secondary | ICD-10-CM | POA: Diagnosis not present

## 2020-02-13 DIAGNOSIS — C50919 Malignant neoplasm of unspecified site of unspecified female breast: Secondary | ICD-10-CM | POA: Diagnosis not present

## 2020-02-22 ENCOUNTER — Ambulatory Visit: Payer: PPO | Admitting: Dermatology

## 2020-02-22 ENCOUNTER — Other Ambulatory Visit: Payer: Self-pay

## 2020-02-22 DIAGNOSIS — L98491 Non-pressure chronic ulcer of skin of other sites limited to breakdown of skin: Secondary | ICD-10-CM

## 2020-02-22 NOTE — Progress Notes (Signed)
   Follow-Up Visit   Subjective  Susan Myers is a 70 y.o. female who presents for the following: Follow-up (ulcer of mid vertex scalp. Using Timolol drops daily. Improved.).   The following portions of the chart were reviewed this encounter and updated as appropriate:     Review of Systems: No other skin or systemic complaints.  Objective  Well appearing patient in no apparent distress; mood and affect are within normal limits.  A focused examination was performed including scalp. Relevant physical exam findings are noted in the Assessment and Plan.  Objective  Mid Vertex Scalp: 78mm erosion in an area of erythema measuring 3.0 x 1.5 cm.  Images      Assessment & Plan  Non-pressure chronic ulcer of skin of other sites limited to breakdown of skin (HCC) Mid Vertex Scalp  At biopsy proven AK site.   Much improvement today. Mechanical debridement today and wound cleansed with Puracyn. Timolol drops applied. Continue ketoconazole 2% shampoo or other shampoo qod.  Return in about 2 months (around 04/23/2020) for scalp ulcer.

## 2020-03-21 ENCOUNTER — Other Ambulatory Visit: Payer: PPO

## 2020-04-03 ENCOUNTER — Ambulatory Visit
Admission: RE | Admit: 2020-04-03 | Discharge: 2020-04-03 | Disposition: A | Discharge status: Home / Self Care | Payer: PPO | Source: Ambulatory Visit | Attending: Oncology | Admitting: Oncology

## 2020-04-03 DIAGNOSIS — R922 Inconclusive mammogram: Secondary | ICD-10-CM | POA: Diagnosis not present

## 2020-04-03 DIAGNOSIS — C50411 Malignant neoplasm of upper-outer quadrant of right female breast: Secondary | ICD-10-CM

## 2020-04-03 DIAGNOSIS — Z853 Personal history of malignant neoplasm of breast: Secondary | ICD-10-CM | POA: Diagnosis not present

## 2020-04-17 DIAGNOSIS — Z853 Personal history of malignant neoplasm of breast: Secondary | ICD-10-CM | POA: Diagnosis not present

## 2020-04-19 ENCOUNTER — Other Ambulatory Visit: Payer: Self-pay

## 2020-04-19 ENCOUNTER — Ambulatory Visit: Payer: PPO | Admitting: Dermatology

## 2020-04-19 DIAGNOSIS — L578 Other skin changes due to chronic exposure to nonionizing radiation: Secondary | ICD-10-CM | POA: Diagnosis not present

## 2020-04-19 DIAGNOSIS — L98499 Non-pressure chronic ulcer of skin of other sites with unspecified severity: Secondary | ICD-10-CM

## 2020-04-19 NOTE — Progress Notes (Signed)
   Follow-Up Visit   Subjective  Susan Myers is a 69 y.o. female who presents for the following: recheck ulceration on the scalp (healed per patient, no longer bothersome).  The following portions of the chart were reviewed this encounter and updated as appropriate:  Tobacco  Allergies  Meds  Problems  Med Hx  Surg Hx  Fam Hx     Review of Systems:  No other skin or systemic complaints except as noted in HPI or Assessment and Plan.  Objective  Well appearing patient in no apparent distress; mood and affect are within normal limits.  A focused examination was performed including the scalp. Relevant physical exam findings are noted in the Assessment and Plan.  Objective  Mid vertex scalp: Crusts and scabs  Assessment & Plan    Non-pressure chronic ulcer of skin (HCC) Mid vertex scalp  At biopsy proven AK site - healing nicely  Continue wound care daily until crusts and scabs resolve. No debridement needed today.   Actinic Damage - diffuse scaly erythematous macules with underlying dyspigmentation - Recommend daily broad spectrum sunscreen SPF 30+ to sun-exposed areas, reapply every 2 hours as needed.  - Call for new or changing lesions.   Return in about 3 months (around 07/20/2020) for TBSE - hx of AK's.  Luther Redo, CMA, am acting as scribe for Sarina Ser, MD .  Documentation: I have reviewed the above documentation for accuracy and completeness, and I agree with the above.  Sarina Ser, MD

## 2020-04-28 ENCOUNTER — Encounter: Payer: Self-pay | Admitting: Dermatology

## 2020-05-06 NOTE — Progress Notes (Signed)
[place]  Telephone:([phone]) [phone] Fax:(336) [phone]  ID: Susan Myers OB: 06-03-1951  MR#: [id number]  JAS#:[id number]  Patient Care Team: Idelle Crouch, MD as PCP - General (Internal Medicine) Lloyd Huger, MD as Consulting Physician (Oncology)  I connected with Susan Myers on 05/09/20 at  2:00 PM EDT by video enabled telemedicine visit and verified that I am speaking with the correct person using two identifiers.   I discussed the limitations, risks, security and privacy concerns of performing an evaluation and management service by telemedicine and the availability of in-person appointments. I also discussed with the patient that there may be a patient responsible charge related to this service. The patient expressed understanding and agreed to proceed.   Other persons participating in the visit and their role in the encounter: Patient, MD.  Patients location: Home. Providers location: Clinic.  CHIEF COMPLAINT: Clinical stage IA ER/PR positive, HER-2 negative invasive carcinoma of the upper outer quadrant of the right breast.  INTERVAL HISTORY: Patient agreed to video enabled telemedicine visit for her routine 49-month  She continues to feel well and remains asymptomatic.  She is tolerating letrozole without significant side effects.  She has no neurologic complaints.  She denies any recent fevers or illnesses.  She has a good appetite and denies weight loss.  She denies any chest pain, shortness of breath, cough, or hemoptysis.  She denies any nausea, vomiting, constipation, or diarrhea.  She has no urinary complaints.  Patient feels at her baseline and offers no specific complaints today.  REVIEW OF SYSTEMS:   Review of Systems  Constitutional: Negative.  Negative for fever, malaise/fatigue and weight loss.  Respiratory: Negative.  Negative for cough and shortness of breath.   Cardiovascular: Negative.  Negative for chest pain and leg  swelling.  Gastrointestinal: Negative.  Negative for abdominal pain.  Genitourinary: Negative.  Negative for dysuria.  Musculoskeletal: Negative.  Negative for back pain.  Skin: Negative.  Negative for rash.  Neurological: Negative.  Negative for sensory change, focal weakness and weakness.  Psychiatric/Behavioral: Negative.  The patient is not nervous/anxious.     As per HPI. Otherwise, a complete review of systems is negative.  PAST MEDICAL HISTORY: Past Medical History:  Diagnosis Date   Actinic keratosis 01/10/2019   mid vertex scalp   [place]   invasive mammary carcinoma   Complication of anesthesia    Delusional disorder (HCC)    DVT (deep venous thrombosis) [place] 06[place]2016   GERD (gastroesophageal reflux disease)    History of kidney stones    Hx of blood clots    Hypercholesterolemia    Hypothyroidism    Kidney stones    Personal history of radiation therapy 2019   right breast cancer   PONV (postoperative nausea and vomiting)    Thyroid disease     PAST SURGICAL HISTORY: Past Surgical History:  Procedure Laterality Date   ABDOMINAL HYSTERECTOMY     APPENDECTOMY     BREAST BIOPSY Right 03/23/2018   Affirm Bx- invasive mammary carcinoma   BREAST CYST ASPIRATION Bilateral 1990   BREAST LUMPECTOMY Right 2019   invasive mammary carcinoma   COLONOSCOPY     IVC FILTER INSERTION     PARTIAL MASTECTOMY WITH NEEDLE LOCALIZATION Right 04/01/2018   Procedure: PARTIAL MASTECTOMY WITH NEEDLE LOCALIZATION;  Surgeon: CHerbert Pun MD;  Location: [facility];  Service: General;  Laterality: Right;   PERIPHERAL VASCULAR CATHETERIZATION N/A 08/06/2016   Procedure: IVC Filter Insertion;  Surgeon: Katha Cabal, MD;  Location: [place] [facility];  Service: Cardiovascular;  Laterality: N/A;   PERIPHERAL VASCULAR CATHETERIZATION Left 08/06/2016   Procedure: Lower Extremity Venography;  Surgeon: Katha Cabal, MD;  Location: [name] [facility];  Service: Cardiovascular;  Laterality: Left;   PERIPHERAL VASCULAR CATHETERIZATION  08/06/2016   Procedure: Lower Extremity Intervention;  Surgeon: Katha Cabal, MD;  Location: [place] [facility];  Service: Cardiovascular;;   PERIPHERAL VASCULAR CATHETERIZATION N/A 12/16/2016   Procedure: IVC Filter Removal;  Surgeon: Katha Cabal, MD;  Location: [place] [facility];  Service: Cardiovascular;  Laterality: N/A;   SENTINEL NODE BIOPSY Right 04/01/2018   Procedure: SENTINEL NODE BIOPSY;  Surgeon: Herbert Pun, MD;  Location: [facility];  Service: General;  Laterality: Right;   TONSILLECTOMY      FAMILY HISTORY: Family History  Problem Relation Age of Onset   Breast cancer Neg Hx    Prostate cancer Neg Hx    Kidney cancer Neg Hx     ADVANCED DIRECTIVES (Y/N):  N  HEALTH MAINTENANCE: Social History   Tobacco Use   Smoking status: Never Smoker   Smokeless tobacco: Never Used  Vaping Use   Vaping Use: Never used  Substance Use Topics   Alcohol use: No   Drug use: No     Colonoscopy:  PAP:  Bone density:  Lipid panel:  Allergies  Allergen Reactions   Levaquin [Levofloxacin In D5w] Hives   Macrolides And Ketolides Other (See Comments)    WHELPS WHELPS WHELPS   Penicillins Itching    Can take amoxicillin  Has patient had a PCN reaction causing immediate rash, facial/tongue/throat swelling, SOB or lightheadedness with hypotension:No Has patient had a PCN reaction causing severe rash involving mucus membranes or skin necrosis:unsure Has patient had a PCN reaction that required hospitalization:No Has patient had a PCN reaction occurring within the last 10 years: No If all of the above answers are "NO", then may proceed with Cephalosporin use.   Sulfa Antibiotics Hives    Current Outpatient Medications  Medication Sig Dispense Refill   acetaminophen (TYLENOL) 500 MG tablet Take 500 mg by mouth every 6 (six) hours as needed  for moderate pain or headache.     Cholecalciferol (VITAMIN D PO) Take 1 tablet by mouth once a week.     hydrochlorothiazide (HYDRODIURIL) 25 MG tablet Take 25 mg by mouth daily.     ketoconazole (NIZORAL) 2 % shampoo      letrozole (FEMARA) 2.5 MG tablet Take 1 tablet (2.5 mg total) by mouth daily. 90 tablet 3   levothyroxine (SYNTHROID) 100 MCG tablet Take 100 mcg by mouth daily.     meloxicam (MOBIC) 7.5 MG tablet Take 7.5 mg by mouth daily as needed for pain.     phenylephrine (SUDAFED PE) 10 MG TABS tablet Take 10 mg by mouth every 6 (six) hours as needed (congestion).     risperiDONE (RISPERDAL) 1 MG tablet Take 1 mg by mouth at bedtime.     rivaroxaban (XARELTO) 20 MG TABS tablet Take 20 mg by mouth daily.     simvastatin (ZOCOR) 20 MG tablet Take 20 mg by mouth every evening.      timolol (TIMOPTIC) 0.5 % ophthalmic solution 1 drop daily.     No current facility-administered medications for this visit.    OBJECTIVE: There were no vitals filed for this visit.   There is no height or weight on file to calculate BMI.  ECOG FS:0 - Asymptomatic  General: Well-developed, well-nourished, no acute distress. HEENT: Normocephalic. Neuro: Alert, answering all questions appropriately. Cranial nerves grossly intact. Psych: Normal affect.  LAB RESULTS:  Lab Results  Component Value Date   NA 143 11/23/2015   K 3.4 (L) 11/23/2015   CL 105 11/23/2015   CO2 28 11/23/2015   GLUCOSE 101 (H) 11/23/2015   BUN 23 (H) 08/06/2016   CREATININE 0.88 08/06/2016   CALCIUM 9.7 11/23/2015   PROT 7.5 11/23/2015   ALBUMIN 4.8 11/23/2015   AST 22 11/23/2015   ALT 24 11/23/2015   ALKPHOS 88 11/23/2015   BILITOT 0.8 11/23/2015   GFRNONAA >60 08/06/2016   GFRAA >60 08/06/2016    Lab Results  Component Value Date   WBC 5.5 06/15/2018   HGB 14.5 06/15/2018   HCT 42.7 06/15/2018   MCV 93.7 06/15/2018   PLT 168 06/15/2018     STUDIES: No results found.  ASSESSMENT: Clinical  stage IA ER/PR positive, HER-2 negative invasive carcinoma of the upper outer quadrant of the right breast.  Oncotype DX score 6, low risk.  PLAN:    1. Clinical stage IA ER/PR positive, HER-2 negative invasive carcinoma of the upper outer quadrant of the right breast: Patient underwent lumpectomy on Apr 01, 2018 confirming the stage of disease.  She had an Oncotype DX score of 6 which confers a low risk therefore patient did not require adjuvant chemotherapy.  She completed adjuvant XRT in approximately August 2019.  Continue letrozole for a total of 5 years completing in August 2024.  Her most recent mammogram on Apr 03, 2020 was reported as BI-RADS 2.  Repeat in May 2022.  Return to clinic in 6 months with video assisted telemedicine visit.   2.  Osteopenia: Repeat bone mineral density on July 14, 2019 reported T score of -1.8.  This is mildly improved from 1 year prior when the T score was reported at -1.9.  Continue calcium and vitamin D supplementation.  Repeat in August 2021.  I provided 20 minutes of face-to-face video visit time during this encounter which included chart review, counseling, and coordination of care as documented above.   Patient expressed understanding and was in agreement with this plan. She also understands that She can call clinic at any time with any questions, concerns, or complaints.   Cancer Staging Primary cancer of upper outer quadrant of right female breast [facility]) Staging form: Breast, AJCC 8th Edition - Clinical stage from 03/26/2018: Stage IA (cT1b, cN0, cM0, G2, ER+, PR+, HER2-) - Signed by Lloyd Huger, MD on 03/26/2018   Lloyd Huger, MD   05/09/2020 9:43 AM

## 2020-05-08 ENCOUNTER — Encounter: Payer: Self-pay | Admitting: Oncology

## 2020-05-08 ENCOUNTER — Inpatient Hospital Stay: Payer: PPO | Attending: Oncology | Admitting: Oncology

## 2020-05-08 DIAGNOSIS — M797 Fibromyalgia: Secondary | ICD-10-CM | POA: Insufficient documentation

## 2020-05-08 DIAGNOSIS — C50411 Malignant neoplasm of upper-outer quadrant of right female breast: Secondary | ICD-10-CM

## 2020-05-08 NOTE — Progress Notes (Signed)
Patient prescreened for appointment. Patient has no concerns or questions.  

## 2020-07-05 ENCOUNTER — Ambulatory Visit
Admission: RE | Admit: 2020-07-05 | Discharge: 2020-07-05 | Disposition: A | Discharge status: Home / Self Care | Payer: PPO | Source: Ambulatory Visit | Attending: Radiation Oncology | Admitting: Radiation Oncology

## 2020-07-05 ENCOUNTER — Encounter: Payer: Self-pay | Admitting: Radiation Oncology

## 2020-07-05 ENCOUNTER — Other Ambulatory Visit: Payer: Self-pay

## 2020-07-05 VITALS — BP 138/78 | Temp 96.4°F | Resp 16 | Wt 211.3 lb

## 2020-07-05 DIAGNOSIS — Z170 Estrogen receptor positive status [ER+]: Secondary | ICD-10-CM | POA: Diagnosis not present

## 2020-07-05 DIAGNOSIS — Z79811 Long term (current) use of aromatase inhibitors: Secondary | ICD-10-CM | POA: Insufficient documentation

## 2020-07-05 DIAGNOSIS — Z923 Personal history of irradiation: Secondary | ICD-10-CM | POA: Diagnosis not present

## 2020-07-05 DIAGNOSIS — C50411 Malignant neoplasm of upper-outer quadrant of right female breast: Secondary | ICD-10-CM | POA: Diagnosis not present

## 2020-07-05 NOTE — Progress Notes (Signed)
Radiation Oncology Follow up Note  Name: Susan Myers   Date:   07/05/2020 MRN:  [id number] DOB: 01-09-1951    This 69 y.o. female presents to the clinic today for 2-year follow-up status post whole breast radiation to her right breast for stage I ER/PR positive at invasive mammary carcinoma.  REFERRING PROVIDER: Idelle Crouch, MD  HPI: Patient is a 69 year old female now at 2 years having completed whole breast radiation to her right breast for stage I ER/PR positive invasive mammary carcinoma.  She is seen today in routine follow-up doing well specifically denies breast tenderness cough or bone pain..  She is currently on Femara tolerating that well without side effect.  She had mammograms back in May which were BI-RADS 2 benign which I have reviewed.  COMPLICATIONS OF TREATMENT: none  FOLLOW UP COMPLIANCE: keeps appointments   PHYSICAL EXAM:  BP 138/78 (BP Location: Left Arm)   Temp (!) 96.4 F (35.8 C) (Tympanic)   Resp 16   Wt 211 lb 4.8 oz (95.8 kg)   BMI 38.65 kg/m   RADIOLOGY RESULTS: Lungs are clear to A&P cardiac examination essentially unremarkable with regular rate and rhythm. No dominant mass or nodularity is noted in either breast in 2 positions examined. Incision is well-healed. No axillary or supraclavicular adenopathy is appreciated. Cosmetic result is excellent.  Well-developed well-nourished patient in NAD. HEENT reveals PERLA, EOMI, discs not visualized.  Oral cavity is clear. No oral mucosal lesions are identified. Neck is clear without evidence of cervical or supraclavicular adenopathy. Lungs are clear to A&P. Cardiac examination is essentially unremarkable with regular rate and rhythm without murmur rub or thrill. Abdomen is benign with no organomegaly or masses noted. Motor sensory and DTR levels are equal and symmetric in the upper and lower extremities. Cranial nerves II through XII are grossly intact. Proprioception is intact. No peripheral adenopathy or  edema is identified. No motor or sensory levels are noted. Crude visual fields are within normal range.  PLAN: Present time patient is now 2 years out with no evidence of disease and pleased with her overall progress.  She continues on Femara without side effect.  I have asked to see her back in 1 year for follow-up.  She knows to call with any concerns.  I would like to take this opportunity to thank you for allowing me to participate in the care of your patient.Susan Filbert, MD

## 2020-07-18 ENCOUNTER — Ambulatory Visit
Admission: RE | Admit: 2020-07-18 | Discharge: 2020-07-18 | Disposition: A | Discharge status: Home / Self Care | Payer: PPO | Source: Ambulatory Visit | Attending: Oncology | Admitting: Oncology

## 2020-07-18 ENCOUNTER — Other Ambulatory Visit: Payer: Self-pay

## 2020-07-18 DIAGNOSIS — M85851 Other specified disorders of bone density and structure, right thigh: Secondary | ICD-10-CM | POA: Insufficient documentation

## 2020-07-18 DIAGNOSIS — C50411 Malignant neoplasm of upper-outer quadrant of right female breast: Secondary | ICD-10-CM | POA: Diagnosis not present

## 2020-07-18 DIAGNOSIS — Z780 Asymptomatic menopausal state: Secondary | ICD-10-CM | POA: Diagnosis not present

## 2020-07-18 DIAGNOSIS — M8589 Other specified disorders of bone density and structure, multiple sites: Secondary | ICD-10-CM | POA: Diagnosis not present

## 2020-07-18 DIAGNOSIS — R29890 Loss of height: Secondary | ICD-10-CM | POA: Diagnosis not present

## 2020-07-24 ENCOUNTER — Ambulatory Visit: Payer: PPO | Admitting: Dermatology

## 2020-07-24 ENCOUNTER — Other Ambulatory Visit: Payer: Self-pay

## 2020-07-24 DIAGNOSIS — L578 Other skin changes due to chronic exposure to nonionizing radiation: Secondary | ICD-10-CM

## 2020-07-24 DIAGNOSIS — L821 Other seborrheic keratosis: Secondary | ICD-10-CM | POA: Diagnosis not present

## 2020-07-24 DIAGNOSIS — Z853 Personal history of malignant neoplasm of breast: Secondary | ICD-10-CM | POA: Diagnosis not present

## 2020-07-24 DIAGNOSIS — D239 Other benign neoplasm of skin, unspecified: Secondary | ICD-10-CM

## 2020-07-24 DIAGNOSIS — Z1283 Encounter for screening for malignant neoplasm of skin: Secondary | ICD-10-CM | POA: Diagnosis not present

## 2020-07-24 DIAGNOSIS — Z872 Personal history of diseases of the skin and subcutaneous tissue: Secondary | ICD-10-CM

## 2020-07-24 DIAGNOSIS — D1800 Hemangioma unspecified site: Secondary | ICD-10-CM

## 2020-07-24 DIAGNOSIS — D2271 Melanocytic nevi of right lower limb, including hip: Secondary | ICD-10-CM | POA: Diagnosis not present

## 2020-07-24 DIAGNOSIS — L570 Actinic keratosis: Secondary | ICD-10-CM | POA: Diagnosis not present

## 2020-07-24 DIAGNOSIS — D229 Melanocytic nevi, unspecified: Secondary | ICD-10-CM | POA: Diagnosis not present

## 2020-07-24 DIAGNOSIS — D485 Neoplasm of uncertain behavior of skin: Secondary | ICD-10-CM

## 2020-07-24 DIAGNOSIS — L820 Inflamed seborrheic keratosis: Secondary | ICD-10-CM | POA: Diagnosis not present

## 2020-07-24 DIAGNOSIS — L814 Other melanin hyperpigmentation: Secondary | ICD-10-CM | POA: Diagnosis not present

## 2020-07-24 HISTORY — DX: Other benign neoplasm of skin, unspecified: D23.9

## 2020-07-24 NOTE — Progress Notes (Addendum)
Follow-Up Visit   Subjective  Susan Myers is a 69 y.o. female who presents for the following: Annual Exam (History of AK of mid vertex scalp - TBSE today). The patient presents for Total-Body Skin Exam (TBSE) for skin cancer screening and mole check.  The following portions of the chart were reviewed this encounter and updated as appropriate:  Tobacco  Allergies  Meds  Problems  Med Hx  Surg Hx  Fam Hx     Review of Systems:  No other skin or systemic complaints except as noted in HPI or Assessment and Plan.  Objective  Well appearing patient in no apparent distress; mood and affect are within normal limits.  A full examination was performed including scalp, head, eyes, ears, nose, lips, neck, chest, axillae, abdomen, back, buttocks, bilateral upper extremities, bilateral lower extremities, hands, feet, fingers, toes, fingernails, and toenails. All findings within normal limits unless otherwise noted below.  Objective  Mid vertex scalp: Well healed biopsy site. Ulcer site is completely re-epithelialized  Objective  Right Breast: Scar. No lymphadenopathy.  Objective  Chest - Medial [facility]), Right Lower Leg - Anterior: Erythematous keratotic or waxy stuck-on papule or plaque.   Objective  Left cheek: Erythematous thin papules/macules with gritty scale.   Objective  Right medial calf: 0.4 cm dark brown macule   Assessment & Plan    Lentigines - Scattered tan macules - Discussed due to sun exposure - Benign, observe - Call for any changes  Seborrheic Keratoses - Stuck-on, waxy, tan-brown papules and plaques  - Discussed benign etiology and prognosis. - Observe - Call for any changes  Melanocytic Nevi - Tan-brown and/or pink-flesh-colored symmetric macules and papules - Benign appearing on exam today - Observation - Call clinic for new or changing moles - Recommend daily use of broad spectrum spf 30+ sunscreen to sun-exposed areas.    Hemangiomas - Red papules - Discussed benign nature - Observe - Call for any changes  Actinic Damage - diffuse scaly erythematous macules with underlying dyspigmentation - Recommend daily broad spectrum sunscreen SPF 30+ to sun-exposed areas, reapply every 2 hours as needed.  - Call for new or changing lesions.  Skin cancer screening performed today.  History of actinic keratoses -biopsy-proven with persistent ulcer requiring frequent office visits.  It is now healed and reepithelialized fully. Mid vertex scalp Healed well. No evidence of ulcer or recurrence of AK.  History of breast cancer Right Breast No lymphadenopathy.  No evidence of abnormalities on the skin of the breast today.  Inflamed seborrheic keratosis (2) Right Lower Leg - Anterior; Chest - Medial (Center) LN2 destruction x 2 after consent performed. Destruction of lesion - Chest - Medial [facility]), Right Lower Leg - Anterior  AK (actinic keratosis) Left cheek  Destruction of lesion - Left cheek Complexity: simple   Destruction method: cryotherapy   Informed consent: discussed and consent obtained   Timeout:  patient name, date of birth, surgical site, and procedure verified Lesion destroyed using liquid nitrogen: Yes   Region frozen until ice ball extended beyond lesion: Yes   Outcome: patient tolerated procedure well with no complications   Post-procedure details: wound care instructions given    Neoplasm of uncertain behavior of skin Right medial calf  Epidermal / dermal shaving  Lesion diameter (cm):  0.4 Informed consent: discussed and consent obtained   Timeout: patient name, date of birth, surgical site, and procedure verified   Procedure prep:  Patient was prepped and draped in usual sterile fashion  Prep type:  Isopropyl alcohol Anesthesia: the lesion was anesthetized in a standard fashion   Anesthetic:  1% lidocaine w/ epinephrine 1-100,000 buffered w/ 8.4% NaHCO3 Instrument used: flexible  razor blade   Hemostasis achieved with: pressure, aluminum chloride and electrodesiccation   Outcome: patient tolerated procedure well   Post-procedure details: sterile dressing applied and wound care instructions given   Dressing type: bandage and petrolatum    Specimen 1 - Surgical pathology Differential Diagnosis: Nevus vs dysplastic nevus Check Margins: No 0.4 cm dark brown macule  Return for follow up in 3-6 months.   Documentation: I have reviewed the above documentation for accuracy and completeness, and I agree with the above.  Sarina Ser, MD

## 2020-07-24 NOTE — Patient Instructions (Signed)

## 2020-07-25 ENCOUNTER — Other Ambulatory Visit: Payer: Self-pay | Admitting: Physician Assistant

## 2020-07-25 DIAGNOSIS — H6980 Other specified disorders of Eustachian tube, unspecified ear: Secondary | ICD-10-CM | POA: Diagnosis not present

## 2020-07-25 DIAGNOSIS — H903 Sensorineural hearing loss, bilateral: Secondary | ICD-10-CM | POA: Diagnosis not present

## 2020-07-25 DIAGNOSIS — E041 Nontoxic single thyroid nodule: Secondary | ICD-10-CM | POA: Diagnosis not present

## 2020-07-25 DIAGNOSIS — H6122 Impacted cerumen, left ear: Secondary | ICD-10-CM | POA: Diagnosis not present

## 2020-07-28 ENCOUNTER — Encounter: Payer: Self-pay | Admitting: Dermatology

## 2020-07-31 ENCOUNTER — Telehealth: Payer: Self-pay

## 2020-07-31 NOTE — Telephone Encounter (Signed)
-----   Message from Ralene Bathe, MD sent at 07/26/2020  6:19 PM EDT ----- Skin , righ medial calf DYSPLASTIC COMPOUND NEVUS WITH MODERATE ATYPIA,  Dysplastic Moderate Recheck next visit (08/01/2021)

## 2020-07-31 NOTE — Telephone Encounter (Signed)
Opened in error

## 2020-07-31 NOTE — Telephone Encounter (Signed)
Left message on voicemail to return my call.  

## 2020-07-31 NOTE — Telephone Encounter (Signed)
Discussed biopsy results with pt  °

## 2020-08-01 ENCOUNTER — Other Ambulatory Visit: Payer: Self-pay

## 2020-08-01 ENCOUNTER — Ambulatory Visit
Admission: RE | Admit: 2020-08-01 | Discharge: 2020-08-01 | Disposition: A | Discharge status: Home / Self Care | Payer: PPO | Source: Ambulatory Visit | Attending: Physician Assistant | Admitting: Physician Assistant

## 2020-08-01 DIAGNOSIS — E041 Nontoxic single thyroid nodule: Secondary | ICD-10-CM | POA: Diagnosis not present

## 2020-08-09 DIAGNOSIS — R8290 Unspecified abnormal findings in urine: Secondary | ICD-10-CM | POA: Diagnosis not present

## 2020-08-09 DIAGNOSIS — E039 Hypothyroidism, unspecified: Secondary | ICD-10-CM | POA: Diagnosis not present

## 2020-08-09 DIAGNOSIS — E7800 Pure hypercholesterolemia, unspecified: Secondary | ICD-10-CM | POA: Diagnosis not present

## 2020-08-09 DIAGNOSIS — I10 Essential (primary) hypertension: Secondary | ICD-10-CM | POA: Diagnosis not present

## 2020-08-09 DIAGNOSIS — R7309 Other abnormal glucose: Secondary | ICD-10-CM | POA: Diagnosis not present

## 2020-08-09 DIAGNOSIS — Z79899 Other long term (current) drug therapy: Secondary | ICD-10-CM | POA: Diagnosis not present

## 2020-08-24 ENCOUNTER — Other Ambulatory Visit: Payer: Self-pay | Admitting: Internal Medicine

## 2020-08-24 DIAGNOSIS — Z79899 Other long term (current) drug therapy: Secondary | ICD-10-CM | POA: Diagnosis not present

## 2020-08-24 DIAGNOSIS — H9191 Unspecified hearing loss, right ear: Secondary | ICD-10-CM

## 2020-08-24 DIAGNOSIS — R400 Somnolence: Secondary | ICD-10-CM | POA: Diagnosis not present

## 2020-08-24 DIAGNOSIS — C50919 Malignant neoplasm of unspecified site of unspecified female breast: Secondary | ICD-10-CM | POA: Diagnosis not present

## 2020-08-24 DIAGNOSIS — Z0000 Encounter for general adult medical examination without abnormal findings: Secondary | ICD-10-CM | POA: Diagnosis not present

## 2020-08-24 DIAGNOSIS — I10 Essential (primary) hypertension: Secondary | ICD-10-CM | POA: Diagnosis not present

## 2020-08-24 DIAGNOSIS — E7800 Pure hypercholesterolemia, unspecified: Secondary | ICD-10-CM | POA: Diagnosis not present

## 2020-08-24 DIAGNOSIS — I82402 Acute embolism and thrombosis of unspecified deep veins of left lower extremity: Secondary | ICD-10-CM | POA: Diagnosis not present

## 2020-08-24 DIAGNOSIS — E039 Hypothyroidism, unspecified: Secondary | ICD-10-CM | POA: Diagnosis not present

## 2020-08-24 DIAGNOSIS — R7309 Other abnormal glucose: Secondary | ICD-10-CM | POA: Diagnosis not present

## 2020-08-29 DIAGNOSIS — H6983 Other specified disorders of Eustachian tube, bilateral: Secondary | ICD-10-CM | POA: Diagnosis not present

## 2020-08-29 DIAGNOSIS — E041 Nontoxic single thyroid nodule: Secondary | ICD-10-CM | POA: Diagnosis not present

## 2020-08-30 ENCOUNTER — Other Ambulatory Visit: Payer: Self-pay

## 2020-08-30 ENCOUNTER — Ambulatory Visit
Admission: RE | Admit: 2020-08-30 | Discharge: 2020-08-30 | Disposition: A | Discharge status: Home / Self Care | Payer: PPO | Source: Ambulatory Visit | Attending: Internal Medicine | Admitting: Internal Medicine

## 2020-08-30 DIAGNOSIS — R400 Somnolence: Secondary | ICD-10-CM | POA: Diagnosis not present

## 2020-08-30 DIAGNOSIS — H9191 Unspecified hearing loss, right ear: Secondary | ICD-10-CM | POA: Insufficient documentation

## 2020-08-30 DIAGNOSIS — I6529 Occlusion and stenosis of unspecified carotid artery: Secondary | ICD-10-CM | POA: Diagnosis not present

## 2020-08-30 DIAGNOSIS — I708 Atherosclerosis of other arteries: Secondary | ICD-10-CM | POA: Diagnosis not present

## 2020-08-30 DIAGNOSIS — D329 Benign neoplasm of meninges, unspecified: Secondary | ICD-10-CM | POA: Diagnosis not present

## 2020-11-06 ENCOUNTER — Other Ambulatory Visit: Payer: Self-pay

## 2020-11-06 ENCOUNTER — Encounter: Payer: Self-pay | Admitting: Oncology

## 2020-11-06 ENCOUNTER — Inpatient Hospital Stay: Payer: PPO | Attending: Oncology | Admitting: Oncology

## 2020-11-06 DIAGNOSIS — C50911 Malignant neoplasm of unspecified site of right female breast: Secondary | ICD-10-CM | POA: Diagnosis not present

## 2020-11-06 MED ORDER — LETROZOLE 2.5 MG PO TABS: 2.5000 mg | ORAL_TABLET | Freq: Every day | ORAL | 3 refills | Status: DC

## 2020-11-06 NOTE — Progress Notes (Signed)
Patient denies any concerns today.  

## 2020-11-06 NOTE — Progress Notes (Signed)
[facility]  [phone] [phone]) [phone]  ID: Susan Myers OB: 1951/07/22  MR#: [id number]  TKZ#:[id number]  Patient Care Team: Idelle Crouch, MD as PCP - General (Internal Medicine) Lloyd Huger, MD as Consulting Physician (Oncology)  I connected with Susan Myers on 11/06/20 at  2:45 PM EST by video enabled telemedicine visit and verified that I am speaking with the correct person using two identifiers.   I discussed the limitations, risks, security and privacy concerns of performing an evaluation and management service by telemedicine and the availability of in-person appointments. I also discussed with the patient that there may be a patient responsible charge related to this service. The patient expressed understanding and agreed to proceed.   Other persons participating in the visit and their role in the encounter: Patient, NP  Patient's location: Home. Provider's location: Clinic.  CHIEF COMPLAINT: Clinical stage IA ER/PR positive, HER-2 negative invasive carcinoma of the upper outer quadrant of the right breast.  INTERVAL HISTORY: Patient agreed to video enabled telemedicine visit for her routine 83-month.  She was last seen in clinic on 05/08/2020.  In the interim, she has had follow-up with Dr. Baruch Gouty.  Had mammogram in May which was BI-RADS 2 benign.   She is also been seen by Dr. Nehemiah Massed in dermatology.  Found to have several actinic keratosis.  She is followed closely by her primary care provider Dr. Doy Hutching.  She had CT of her head secondary to hearing loss and ultrasound of her thyroid secondary to TSH elevation despite Synthroid. U/S showed no discrete nodules within the thyroid gland.  Hearing loss has improved.  She is tolerating the letrozole well without any side effects. She has no neurologic complaints.  She denies any recent fevers or illnesses.  She has a good appetite and denies weight loss.  She denies  any chest pain, shortness of breath, cough, or hemoptysis.  She denies any nausea, vomiting, constipation, or diarrhea.  She has no urinary complaints.  Patient feels at her baseline and offers no specific complaints today.  REVIEW OF SYSTEMS:   Review of Systems  Constitutional: Negative.  Negative for fever, malaise/fatigue and weight loss.  Respiratory: Negative.  Negative for cough and shortness of breath.   Cardiovascular: Negative.  Negative for chest pain and leg swelling.  Gastrointestinal: Negative.  Negative for abdominal pain.  Genitourinary: Negative.  Negative for dysuria.  Musculoskeletal: Negative.  Negative for back pain.  Skin: Negative.  Negative for rash.  Neurological: Negative.  Negative for sensory change, focal weakness and weakness.  Psychiatric/Behavioral: Negative.  The patient is not nervous/anxious.     As per HPI. Otherwise, a complete review of systems is negative.  PAST MEDICAL HISTORY: Past Medical History:  Diagnosis Date  . Actinic keratosis 01/10/2019   mid vertex scalp  . Breast cancer ([facility]) 2019   invasive mammary carcinoma  . Complication of anesthesia   . Delusional disorder ([facility])   . DVT (deep venous thrombosis) ([facility]) 06/2016  . GERD (gastroesophageal reflux disease)   . History of kidney stones   . Hx of blood clots   . Hypercholesterolemia   . Hypothyroidism   . Kidney stones   . Personal history of radiation therapy 2019   right breast cancer  . PONV (postoperative nausea and vomiting)   . Thyroid disease     PAST SURGICAL HISTORY: Past Surgical History:  Procedure Laterality Date  . ABDOMINAL HYSTERECTOMY    . APPENDECTOMY    .  BREAST BIOPSY Right 03/23/2018   Affirm Bx- invasive mammary carcinoma  . BREAST CYST ASPIRATION Bilateral 1990  . BREAST LUMPECTOMY Right 2019   invasive mammary carcinoma  . COLONOSCOPY    . IVC FILTER INSERTION    . PARTIAL MASTECTOMY WITH NEEDLE LOCALIZATION Right 04/01/2018   Procedure: PARTIAL  MASTECTOMY WITH NEEDLE LOCALIZATION;  Surgeon: Herbert Pun, MD;  Location: [facility];  Service: General;  Laterality: Right;  . PERIPHERAL VASCULAR CATHETERIZATION N/A 08/06/2016   Procedure: IVC Filter Insertion;  Surgeon: Katha Cabal, MD;  Location: [facility];  Service: Cardiovascular;  Laterality: N/A;  . PERIPHERAL VASCULAR CATHETERIZATION Left 08/06/2016   Procedure: Lower Extremity Venography;  Surgeon: Katha Cabal, MD;  Location: [facility];  Service: Cardiovascular;  Laterality: Left;  . PERIPHERAL VASCULAR CATHETERIZATION  08/06/2016   Procedure: Lower Extremity Intervention;  Surgeon: Katha Cabal, MD;  Location: [facility];  Service: Cardiovascular;;  . PERIPHERAL VASCULAR CATHETERIZATION N/A 12/16/2016   Procedure: IVC Filter Removal;  Surgeon: Katha Cabal, MD;  Location: [facility];  Service: Cardiovascular;  Laterality: N/A;  . SENTINEL NODE BIOPSY Right 04/01/2018   Procedure: SENTINEL NODE BIOPSY;  Surgeon: Herbert Pun, MD;  Location: [facility];  Service: General;  Laterality: Right;  . TONSILLECTOMY      FAMILY HISTORY: Family History  Problem Relation Age of Onset  . Breast cancer Neg Hx   . Prostate cancer Neg Hx   . Kidney cancer Neg Hx     ADVANCED DIRECTIVES (Y/N):  N  HEALTH MAINTENANCE: Social History   Tobacco Use  . Smoking status: Never Smoker  . Smokeless tobacco: Never Used  Vaping Use  . Vaping Use: Never used  Substance Use Topics  . Alcohol use: No  . Drug use: No     Colonoscopy:  PAP:  Bone density:  Lipid panel:  Allergies  Allergen Reactions  . Levaquin [Levofloxacin In D5w] Hives  . Macrolides And Ketolides Other (See Comments)    WHELPS WHELPS WHELPS  . Penicillins Itching    Can take amoxicillin  Has patient had a PCN reaction causing immediate rash, facial/tongue/throat swelling, SOB or lightheadedness with hypotension:No Has patient had a PCN  reaction causing severe rash involving mucus membranes or skin necrosis:unsure Has patient had a PCN reaction that required hospitalization:No Has patient had a PCN reaction occurring within the last 10 years: No If all of the above answers are "NO", then may proceed with Cephalosporin use.  . Sulfa Antibiotics Hives    Current Outpatient Medications  Medication Sig Dispense Refill  . acetaminophen (TYLENOL) 500 MG tablet Take 500 mg by mouth every 6 (six) hours as needed for moderate pain or headache.    . Cholecalciferol (VITAMIN D PO) Take 1 tablet by mouth once a week.    . hydrochlorothiazide (HYDRODIURIL) 25 MG tablet Take 25 mg by mouth daily.    Marland Kitchen ketoconazole (NIZORAL) 2 % shampoo     . levothyroxine (SYNTHROID) 150 MCG tablet Take by mouth.    . meloxicam (MOBIC) 7.5 MG tablet Take 7.5 mg by mouth daily as needed for pain.    . phenylephrine (SUDAFED PE) 10 MG TABS tablet Take 10 mg by mouth every 6 (six) hours as needed (congestion).    . risperiDONE (RISPERDAL) 1 MG tablet Take 1 mg by mouth at bedtime.    . rivaroxaban (XARELTO) 20 MG TABS tablet Take 20 mg by mouth daily.    Marland Kitchen  simvastatin (ZOCOR) 20 MG tablet Take 20 mg by mouth every evening.     . timolol (TIMOPTIC) 0.5 % ophthalmic solution 1 drop daily.    Marland Kitchen letrozole (FEMARA) 2.5 MG tablet Take 1 tablet (2.5 mg total) by mouth daily. 90 tablet 3   No current facility-administered medications for this visit.    OBJECTIVE: There were no vitals filed for this visit.   There is no height or weight on file to calculate BMI.    ECOG FS:0 - Asymptomatic  General: Well-developed, well-nourished, no acute distress. HEENT: Normocephalic. Neuro: Alert, answering all questions appropriately. Cranial nerves grossly intact. Psych: Normal affect.  LAB RESULTS:  Lab Results  Component Value Date   NA 143 11/23/2015   K 3.4 (L) 11/23/2015   CL 105 11/23/2015   CO2 28 11/23/2015   GLUCOSE 101 (H) 11/23/2015   BUN 23 (H)  08/06/2016   CREATININE 0.88 08/06/2016   CALCIUM 9.7 11/23/2015   PROT 7.5 11/23/2015   ALBUMIN 4.8 11/23/2015   AST 22 11/23/2015   ALT 24 11/23/2015   ALKPHOS 88 11/23/2015   BILITOT 0.8 11/23/2015   GFRNONAA >60 08/06/2016   GFRAA >60 08/06/2016    Lab Results  Component Value Date   WBC 5.5 06/15/2018   HGB 14.5 06/15/2018   HCT 42.7 06/15/2018   MCV 93.7 06/15/2018   PLT 168 06/15/2018     STUDIES: No results found.  ASSESSMENT: Clinical stage IA ER/PR positive, HER-2 negative invasive carcinoma of the upper outer quadrant of the right breast.  Oncotype DX score 6, low risk.  PLAN:    1. Clinical stage IA ER/PR positive, HER-2 negative invasive carcinoma of the upper outer quadrant of the right breast:  -Diagnosed back in April 2019. -Had lumpectomy on 04/01/2018 confirming stage of disease. -Oncotype DX score of 6 -She did not require adjuvant chemotherapy -Had adjuvant XRT which she completed August 2019.  -Began letrozole in August 2019. -Most recent mammogram is from 04/03/2020 which was read as BI-RADS 2-benign -Repeat mammogram in May 2022.  2.  Osteopenia: -DEXA scan completed on 07/18/2020 revealed T score of -2.1 (-1.8). -She is currently on calcium and vitamin D.  Disposition: -RTC in 6 months for follow-up and to review mammogram.  I provided 20 minutes of face-to-face video visit time during this encounter which included chart review, counseling, and coordination of care as documented above.  Greater than 50% was spent in counseling and coordination of care with this patient including but not limited to discussion of the relevant topics above (See A&P) including, but not limited to diagnosis and management of acute and chronic medical conditions.   Patient expressed understanding and was in agreement with this plan. She also understands that She can call clinic at any time with any questions, concerns, or complaints.   Cancer Staging Primary cancer  of upper outer quadrant of right female breast [facility]) Staging form: Breast, AJCC 8th Edition - Clinical stage from 03/26/2018: Stage IA (cT1b, cN0, cM0, G2, ER+, PR+, HER2-) - Signed by Lloyd Huger, MD on 03/26/2018   Jacquelin Hawking, NP   11/06/2020 2:44 PM

## 2020-11-19 ENCOUNTER — Other Ambulatory Visit: Payer: Self-pay

## 2020-11-19 ENCOUNTER — Encounter: Payer: Self-pay | Admitting: Podiatry

## 2020-11-19 ENCOUNTER — Ambulatory Visit: Payer: PPO | Admitting: Podiatry

## 2020-11-19 DIAGNOSIS — L03032 Cellulitis of left toe: Secondary | ICD-10-CM | POA: Diagnosis not present

## 2020-11-19 DIAGNOSIS — L600 Ingrowing nail: Secondary | ICD-10-CM | POA: Diagnosis not present

## 2020-11-19 MED ORDER — NEOMYCIN-POLYMYXIN-HC 3.5-10000-1 OT SUSP: OTIC | 0 refills | Status: DC

## 2020-11-19 NOTE — Progress Notes (Signed)
  Subjective:  Patient ID: Susan Myers, female    DOB: 15-Apr-1951,  MRN: [id number]  Chief Complaint  Patient presents with  . Ingrown Toenail    Patient presents today for infected ingrown toenail left hallux medial border off and on x 4 months.  She says it was really painful, red and swollen 3 days ago.  She found some Amoxicillin that she previously had and has been taking for past 3 days.  It is some better now.  She has also been soaking in Epson salt    69 y.o. female presents with the above complaint. History confirmed with patient.  She takes Xarelto normally.  Did not take it today  Objective:  Physical Exam: warm, good capillary refill, no trophic changes or ulcerative lesions, normal DP and PT pulses and normal sensory exam. Left Foot: Ingrown hallux nail with mild paronychia  Assessment:   1. Ingrowing left great toenail   2. Paronychia of great toe of left foot      Plan:  Patient was evaluated and treated and all questions answered.    Ingrown Nail, left -Patient elects to proceed with minor surgery to remove ingrown toenail today. Consent reviewed and signed by patient. -Ingrown nail excised. See procedure note. -Educated on post-procedure care including soaking. Written instructions provided and reviewed. -Patient to follow up in 2 weeks for nail check.  Procedure: Excision of Ingrown Toenail Location: Left 1st toe medial nail borders. Anesthesia: Lidocaine 1% plain; 1.5 mL and Marcaine 0.5% plain; 1.5 mL, digital block. Skin Prep: Betadine. Dressing: Silvadene; telfa; dry, sterile, compression dressing. Technique: Following skin prep, the toe was exsanguinated and a tourniquet was secured at the base of the toe. The affected nail border was freed, split with a nail splitter, and excised. Chemical matrixectomy was then performed with phenol and irrigated out with alcohol. The tourniquet was then removed and sterile dressing applied. Disposition: Patient  tolerated procedure well. Patient to return in 2 weeks for follow-up.     Return in about 2 weeks (around 12/03/2020) for nail re-check.

## 2020-11-19 NOTE — Patient Instructions (Addendum)
Soak Instructions    48 HOURS AFTER THE PROCEDURE  Place 1/4 cup of epsom salts or Betadine in a quart of warm tap water.  Submerge your foot or feet with outer bandage intact for the initial soak; this will allow the bandage to become moist and wet for easy lift off.  Once you remove your bandage, continue to soak in the solution for 20 minutes.  This soak should be done twice a day.  Next, remove your foot or feet from solution, blot dry the affected area and cover.  You may use a band aid large enough to cover the area or use gauze and tape.  Apply other medications to the area as directed by the doctor such as polysporin neosporin.  IF YOUR SKIN BECOMES IRRITATED WHILE USING THESE INSTRUCTIONS, IT IS OKAY TO SWITCH TO  WHITE VINEGAR AND WATER. Or you may use antibacterial soap and water to keep the toe clean  Monitor for any signs/symptoms of infection. Call the office immediately if any occur or go directly to the emergency room. Call with any questions/concerns.    [facility] [facility] Instructions-Post Nail Surgery  You have had your ingrown toenail and root treated with a chemical.  This chemical causes a burn that will drain and ooze like a blister.  This can drain for 6-8 weeks or longer.  It is important to keep this area clean, covered, and follow the soaking instructions dispensed at the time of your surgery.  This area will eventually dry and form a scab.  Once the scab forms you no longer need to soak or apply a dressing.  If at any time you experience an increase in pain, redness, swelling, or drainage, you should contact the office as soon as possible.

## 2020-11-26 DIAGNOSIS — E039 Hypothyroidism, unspecified: Secondary | ICD-10-CM | POA: Diagnosis not present

## 2020-11-26 DIAGNOSIS — R8290 Unspecified abnormal findings in urine: Secondary | ICD-10-CM | POA: Diagnosis not present

## 2020-11-26 DIAGNOSIS — Z79899 Other long term (current) drug therapy: Secondary | ICD-10-CM | POA: Diagnosis not present

## 2020-11-26 DIAGNOSIS — I10 Essential (primary) hypertension: Secondary | ICD-10-CM | POA: Diagnosis not present

## 2020-11-26 DIAGNOSIS — R7309 Other abnormal glucose: Secondary | ICD-10-CM | POA: Diagnosis not present

## 2020-12-03 ENCOUNTER — Other Ambulatory Visit: Payer: Self-pay

## 2020-12-03 ENCOUNTER — Ambulatory Visit: Payer: PPO | Admitting: Podiatry

## 2020-12-03 ENCOUNTER — Encounter: Payer: Self-pay | Admitting: Podiatry

## 2020-12-03 DIAGNOSIS — L600 Ingrowing nail: Secondary | ICD-10-CM | POA: Diagnosis not present

## 2020-12-03 DIAGNOSIS — Z79899 Other long term (current) drug therapy: Secondary | ICD-10-CM | POA: Diagnosis not present

## 2020-12-03 DIAGNOSIS — E039 Hypothyroidism, unspecified: Secondary | ICD-10-CM | POA: Diagnosis not present

## 2020-12-03 DIAGNOSIS — E785 Hyperlipidemia, unspecified: Secondary | ICD-10-CM | POA: Diagnosis not present

## 2020-12-03 DIAGNOSIS — R7309 Other abnormal glucose: Secondary | ICD-10-CM | POA: Diagnosis not present

## 2020-12-03 DIAGNOSIS — I10 Essential (primary) hypertension: Secondary | ICD-10-CM | POA: Diagnosis not present

## 2020-12-03 DIAGNOSIS — E7800 Pure hypercholesterolemia, unspecified: Secondary | ICD-10-CM | POA: Diagnosis not present

## 2020-12-03 DIAGNOSIS — C50919 Malignant neoplasm of unspecified site of unspecified female breast: Secondary | ICD-10-CM | POA: Diagnosis not present

## 2020-12-03 NOTE — Progress Notes (Signed)
  Subjective:  Patient ID: Susan Myers, female    DOB: 07/04/1951,  MRN: [id number]  Chief Complaint  Patient presents with  . Ingrown Toenail    "its doing better, just a little tender"    70 y.o. female returns with the above complaint. History confirmed with patient.  Doing well. She has had some drainage and scabbing. She would like the lesser toenails treated in a few weeks  Objective:  Physical Exam: warm, good capillary refill, no trophic changes or ulcerative lesions, normal DP and PT pulses and normal sensory exam. Left Foot: matrixectomy site healing well medial hallux border Assessment:   No diagnosis found.   Plan:  Patient was evaluated and treated and all questions answered.    Ingrown Nail, left -Can leave open to air and discontinue soaks and ointment at this point -return in 3 weeks and we will treat the other toenails that need it with permanent partial avulsions    Return in about 3 weeks (around 12/24/2020) for ingrown nail re-check and removal of others.

## 2020-12-06 ENCOUNTER — Other Ambulatory Visit: Payer: Self-pay | Admitting: Oncology

## 2020-12-06 DIAGNOSIS — C50911 Malignant neoplasm of unspecified site of right female breast: Secondary | ICD-10-CM

## 2020-12-26 ENCOUNTER — Ambulatory Visit: Payer: PPO | Admitting: Podiatry

## 2020-12-26 ENCOUNTER — Other Ambulatory Visit: Payer: Self-pay

## 2020-12-26 ENCOUNTER — Encounter: Payer: Self-pay | Admitting: Podiatry

## 2020-12-26 DIAGNOSIS — L600 Ingrowing nail: Secondary | ICD-10-CM

## 2020-12-26 NOTE — Patient Instructions (Signed)

## 2020-12-26 NOTE — Progress Notes (Signed)
  Subjective:  Patient ID: Susan Myers, female    DOB: 05/25/1951,  MRN: [id number]  Chief Complaint  Patient presents with  . Ingrown Toenail    "my big toe is doing good.  I have four more toes that need to be done and the second toe is the worst"    69 y.o. female returns with the above complaint. History confirmed with patient.  Left great toenail is healing well.  The second and third toes bilaterally are now ingrowing.  Objective:  Physical Exam: warm, good capillary refill, no trophic changes or ulcerative lesions, normal DP and PT pulses and normal sensory exam. Left Foot: Hallux nail avulsion matricectomy is well-healed.  She has ingrowing medial and lateral borders of the second and third toenails bilaterally Assessment:   1. Ingrowing left great toenail   2. Ingrowing toenail of left foot      Plan:  Patient was evaluated and treated and all questions answered.    Ingrown Nail, left -Felt to be best if we treated 1 foot at a time, today the left, will do the right side in a few weeks -Patient elects to proceed with minor surgery to remove ingrown toenail today. Consent reviewed and signed by patient. -Ingrown nail excised. See procedure note. -Educated on post-procedure care including soaking. Written instructions provided and reviewed. -Patient to follow up in 2 weeks for nail check.  Procedure: Excision of Ingrown Toenail Location: Left second and third toe medial lateral borders Anesthesia: Lidocaine 1% plain; 1.5 mL and Marcaine 0.5% plain; 1.5 mL, digital block. Skin Prep: Betadine. Dressing: Silvadene; telfa; dry, sterile, compression dressing. Technique: Following skin prep, the toe was exsanguinated and a tourniquet was secured at the base of the toe. The affected nail border was freed, split with a nail splitter, and excised. Chemical matrixectomy was then performed with phenol and irrigated out with alcohol. The tourniquet was then removed and  sterile dressing applied. Disposition: Patient tolerated procedure well. Patient to return in 2 weeks for follow-up.     Return in about 2 weeks (around 01/09/2021) for check nails on left foot after procedure, do 2/3 toes on right foot.

## 2021-01-09 ENCOUNTER — Encounter: Payer: Self-pay | Admitting: Podiatry

## 2021-01-09 ENCOUNTER — Ambulatory Visit: Payer: PPO | Admitting: Podiatry

## 2021-01-09 ENCOUNTER — Other Ambulatory Visit: Payer: Self-pay

## 2021-01-09 DIAGNOSIS — L600 Ingrowing nail: Secondary | ICD-10-CM | POA: Diagnosis not present

## 2021-01-09 NOTE — Patient Instructions (Signed)

## 2021-01-09 NOTE — Progress Notes (Signed)
  Subjective:  Patient ID: Susan Myers, female    DOB: 25-Jan-1951,  MRN: [id number]  Chief Complaint  Patient presents with  . Ingrown Toenail    "they are doing a little better, still sore to touch.  He is going to remove the other nails on my right toes today"    70 y.o. female returns with the above complaint. History confirmed with patient.  Second and third toes on the left are healing well.  There is a dry scab.  She is ready for the second third toes on right foot now.  Objective:  Physical Exam: warm, good capillary refill, no trophic changes or ulcerative lesions, normal DP and PT pulses and normal sensory exam. Left Foot: Partial nail avulsion matricectomy is healing well on the left second and third toes with dry scab.  No signs infection. Right foot pincer nail deformity of the second and third toes painful on palpation Assessment:   1. Ingrown nail of third toe of right foot   2. Ingrown nail of fourth toe of right foot      Plan:  Patient was evaluated and treated and all questions answered.  Left side is healing well  Ingrown Nail, right -Today we proceeded with the right foot second third toes on both borders -Patient elects to proceed with minor surgery to remove ingrown toenail today. Consent reviewed and signed by patient. -Ingrown nails excised. See procedure note. -Educated on post-procedure care including soaking. Written instructions provided and reviewed. -Patient to follow up in 2 weeks for nail check.  Procedure: Excision of Ingrown Toenail Location: Right second and third toe medial lateral borders Anesthesia: Lidocaine 1% plain; 1.5 mL and Marcaine 0.5% plain; 1.5 mL, digital block. Skin Prep: Betadine. Dressing: Silvadene; telfa; dry, sterile, compression dressing. Technique: Following skin prep, the toe was exsanguinated and a tourniquet was secured at the base of the toe. The affected nail border was freed, split with a nail splitter, and  excised. Chemical matrixectomy was then performed with phenol and irrigated out with alcohol. The tourniquet was then removed and sterile dressing applied. Disposition: Patient tolerated procedure well. Patient to return in 2 weeks for follow-up.     No follow-ups on file.

## 2021-01-20 NOTE — Progress Notes (Signed)
MRN : [id number]  Susan Myers is a 70 y.o. (1951-09-20) female who presents with chief complaint of No chief complaint on file. Marland Kitchen  History of Present Illness:   The patient returns to the office for followupregarding her IVC filter. She is status post angiogram withattempted IVC filter removalon 12/16/2016. Hematuria hasnot recurredand she is now back on her Xarelto. Swelling of her left legremainsstable and "not much" at this time.  There have been no significant changes to the patient's overall health care.  The patient denies amaurosis fugax or recent TIA symptoms. There are no recent neurological changes noted. The patient denies history of DVT, PE or superficial thrombophlebitis. The patient denies recent episodes of angina or shortness of breath.  Duplex ultrasound of the IVC is negative for clot normal flow pattern is identified. No significant change compared to previous study.  No outpatient medications have been marked as taking for the 01/21/21 encounter (Appointment) with Delana Meyer, Dolores Lory, MD.    Past Medical History:  Diagnosis Date  . Actinic keratosis 01/10/2019   mid vertex scalp  . Breast cancer ([place]) 2019   invasive mammary carcinoma  . Complication of anesthesia   . Delusional disorder ([place])   . DVT (deep venous thrombosis) ([place]) 06/2016  . GERD (gastroesophageal reflux disease)   . History of kidney stones   . Hx of blood clots   . Hypercholesterolemia   . Hypothyroidism   . Kidney stones   . Personal history of radiation therapy 2019   right breast cancer  . PONV (postoperative nausea and vomiting)   . Thyroid disease     Past Surgical History:  Procedure Laterality Date  . ABDOMINAL HYSTERECTOMY    . APPENDECTOMY    . BREAST BIOPSY Right 03/23/2018   Affirm Bx- invasive mammary carcinoma  . BREAST CYST ASPIRATION Bilateral 1990  . BREAST LUMPECTOMY Right 2019   invasive mammary carcinoma  . COLONOSCOPY    . IVC  FILTER INSERTION    . PARTIAL MASTECTOMY WITH NEEDLE LOCALIZATION Right 04/01/2018   Procedure: PARTIAL MASTECTOMY WITH NEEDLE LOCALIZATION;  Surgeon: Herbert Pun, MD;  Location: [facility];  Service: General;  Laterality: Right;  . PERIPHERAL VASCULAR CATHETERIZATION N/A 08/06/2016   Procedure: IVC Filter Insertion;  Surgeon: Katha Cabal, MD;  Location: [place] [facility];  Service: Cardiovascular;  Laterality: N/A;  . PERIPHERAL VASCULAR CATHETERIZATION Left 08/06/2016   Procedure: Lower Extremity Venography;  Surgeon: Katha Cabal, MD;  Location: [place] [facility];  Service: Cardiovascular;  Laterality: Left;  . PERIPHERAL VASCULAR CATHETERIZATION  08/06/2016   Procedure: Lower Extremity Intervention;  Surgeon: Katha Cabal, MD;  Location: [place] [facility];  Service: Cardiovascular;;  . PERIPHERAL VASCULAR CATHETERIZATION N/A 12/16/2016   Procedure: IVC Filter Removal;  Surgeon: Katha Cabal, MD;  Location: [place] [facility];  Service: Cardiovascular;  Laterality: N/A;  . SENTINEL NODE BIOPSY Right 04/01/2018   Procedure: SENTINEL NODE BIOPSY;  Surgeon: Herbert Pun, MD;  Location: [facility];  Service: General;  Laterality: Right;  . TONSILLECTOMY      Social History Social History   Tobacco Use  . Smoking status: Never Smoker  . Smokeless tobacco: Never Used  Vaping Use  . Vaping Use: Never used  Substance Use Topics  . Alcohol use: No  . Drug use: No    Family History Family History  Problem Relation Age of Onset  . Breast cancer Neg Hx   . Prostate cancer Neg  Hx   . Kidney cancer Neg Hx     Allergies  Allergen Reactions  . Levaquin [Levofloxacin In D5w] Hives  . Macrolides And Ketolides Other (See Comments)    WHELPS WHELPS WHELPS  . Penicillins Itching    Can take amoxicillin  Has patient had a PCN reaction causing immediate rash, facial/tongue/throat swelling, SOB or lightheadedness with hypotension:No Has patient had a  PCN reaction causing severe rash involving mucus membranes or skin necrosis:unsure Has patient had a PCN reaction that required hospitalization:No Has patient had a PCN reaction occurring within the last 10 years: No If all of the above answers are "NO", then may proceed with Cephalosporin use.  . Sulfa Antibiotics Hives     REVIEW OF SYSTEMS (Negative unless checked)  Constitutional: [] Weight loss  [] Fever  [] Chills Cardiac: [] Chest pain   [] Chest pressure   [] Palpitations   [] Shortness of breath when laying flat   [] Shortness of breath with exertion. Vascular:  [] Pain in legs with walking   [] Pain in legs at rest  [x] History of DVT   [] Phlebitis   [] Swelling in legs   [] Varicose veins   [] Non-healing ulcers Pulmonary:   [] Uses home oxygen   [] Productive cough   [] Hemoptysis   [] Wheeze  [] COPD   [] Asthma Neurologic:  [] Dizziness   [] Seizures   [] History of stroke   [] History of TIA  [] Aphasia   [] Vissual changes   [] Weakness or numbness in arm   [] Weakness or numbness in leg Musculoskeletal:   [] Joint swelling   [] Joint pain   [] Low back pain Hematologic:  [] Easy bruising  [] Easy bleeding   [] Hypercoagulable state   [] Anemic Gastrointestinal:  [] Diarrhea   [] Vomiting  [] Gastroesophageal reflux/heartburn   [] Difficulty swallowing. Genitourinary:  [] Chronic kidney disease   [] Difficult urination  [] Frequent urination   [] Blood in urine Skin:  [] Rashes   [] Ulcers  Psychological:  [] History of anxiety   []  History of major depression.  Physical Examination  There were no vitals filed for this visit. There is no height or weight on file to calculate BMI. Gen: WD/WN, NAD Head: [place]/AT, No temporalis wasting.  Ear/Nose/Throat: Hearing grossly intact, nares w/o erythema or drainage Eyes: PER, EOMI, sclera nonicteric.  Neck: Supple, no large masses.   Pulmonary:  Good air movement, no audible wheezing bilaterally, no use of accessory muscles.  Cardiac: RRR, no JVD Vascular: scattered  varicosities present bilaterally.  Mild venous stasis changes to the legs bilaterally.  2+ soft pitting edema Vessel Right Left  Radial Palpable Palpable  Gastrointestinal: Non-distended. No guarding/no peritoneal signs.  Musculoskeletal: M/S 5/5 throughout.  No deformity or atrophy.  Neurologic: CN 2-12 intact. Symmetrical.  Speech is fluent. Motor exam as listed above. Psychiatric: Judgment intact, Mood & affect appropriate for pt's clinical situation. Dermatologic: Mild rashes or ulcers noted.  No changes consistent with cellulitis. Lymph : No lichenification or skin changes of chronic lymphedema.  CBC Lab Results  Component Value Date   WBC 5.5 06/15/2018   HGB 14.5 06/15/2018   HCT 42.7 06/15/2018   MCV 93.7 06/15/2018   PLT 168 06/15/2018    BMET    Component Value Date/Time   NA 143 11/23/2015 2103   K 3.4 (L) 11/23/2015 2103   CL 105 11/23/2015 2103   CO2 28 11/23/2015 2103   GLUCOSE 101 (H) 11/23/2015 2103   BUN 23 (H) 08/06/2016 1426   CREATININE 0.88 08/06/2016 1426   CALCIUM 9.7 11/23/2015 2103   GFRNONAA >60 08/06/2016 1426   GFRAA >  60 08/06/2016 1426   CrCl cannot be calculated (Patient's most recent lab result is older than the maximum 21 days allowed.).  COAG Lab Results  Component Value Date   INR 1.33 08/06/2016    Radiology No results found.    Assessment/Plan 1. Chronic deep vein thrombosis (DVT) of femoral vein of left lower extremity (HCC) Recommend:   No surgery or intervention at this point in time.  IVC filter is notable to be removed and will therefore remain.  Patient's duplex ultrasound of theIVC shows normal flow pattern.  The patientwill continueon anticoagulation   Elevation was stressed, use of a recliner was discussed.  I have had a long discussion with the patient regarding DVT and post phlebitic changes such as swelling and why it causes symptoms such as pain. The patient will wear graduated compression  stockings class 1 (20-30 mmHg), beginning after three full days of anticoagulation, on a daily basis a prescription was given. The patient will beginning wearing the stockings first thing in the morning and removing them in the evening. The patient is instructed specifically not to sleep in the stockings. In addition, behavioral modification including elevation during the day and avoidance of prolonged dependency will be initiated.   The patient will continue anticoagulation for now as there have not been any problems or complications at this point. - VAS US AORTA/IVC/ILIACS; Future  2. Lymphedema  No surgery or intervention at this point in time.    I have reviewed my previous discussion with the patient regarding swelling and why it  causes symptoms.  The patient is doing well with compression and will continue wearing graduated compression stockings class 1 (20-30 mmHg) on a daily basis a prescription was given. The patient will  continue wearing the stockings first thing in the morning and removing them in the evening. The patient is instructed specifically not to sleep in the stockings.    In addition, behavioral modification including elevation during the day and exercise will be continued.    Patient should follow-up on an annual basis   3. HTN, goal below 140/80 Continue antihypertensive medications as already ordered, these medications have been reviewed and there are no changes at this time.   4. Hypothyroidism, unspecified type Continue Hormone replacement as ordered and reviewed, no changes at this time   5. Hypercholesteremia Continue statin as ordered and reviewed, no changes at this time   Hortencia Pilar, MD  01/20/2021 4:35 PM

## 2021-01-21 ENCOUNTER — Ambulatory Visit (INDEPENDENT_AMBULATORY_CARE_PROVIDER_SITE_OTHER): Payer: PPO | Admitting: Vascular Surgery

## 2021-01-21 ENCOUNTER — Other Ambulatory Visit: Payer: Self-pay

## 2021-01-21 ENCOUNTER — Encounter (INDEPENDENT_AMBULATORY_CARE_PROVIDER_SITE_OTHER): Payer: Self-pay | Admitting: Vascular Surgery

## 2021-01-21 ENCOUNTER — Ambulatory Visit (INDEPENDENT_AMBULATORY_CARE_PROVIDER_SITE_OTHER): Payer: PPO

## 2021-01-21 VITALS — BP 138/85 | HR 85 | Ht 62.0 in | Wt 210.0 lb

## 2021-01-21 DIAGNOSIS — E7800 Pure hypercholesterolemia, unspecified: Secondary | ICD-10-CM

## 2021-01-21 DIAGNOSIS — I890 Lymphedema, not elsewhere classified: Secondary | ICD-10-CM

## 2021-01-21 DIAGNOSIS — E039 Hypothyroidism, unspecified: Secondary | ICD-10-CM | POA: Diagnosis not present

## 2021-01-21 DIAGNOSIS — I82512 Chronic embolism and thrombosis of left femoral vein: Secondary | ICD-10-CM

## 2021-01-21 DIAGNOSIS — I10 Essential (primary) hypertension: Secondary | ICD-10-CM | POA: Diagnosis not present

## 2021-01-23 ENCOUNTER — Other Ambulatory Visit: Payer: Self-pay

## 2021-01-23 ENCOUNTER — Encounter: Payer: Self-pay | Admitting: Podiatry

## 2021-01-23 ENCOUNTER — Ambulatory Visit: Payer: PPO | Admitting: Podiatry

## 2021-01-23 DIAGNOSIS — Z9889 Other specified postprocedural states: Secondary | ICD-10-CM | POA: Diagnosis not present

## 2021-01-23 DIAGNOSIS — L600 Ingrowing nail: Secondary | ICD-10-CM | POA: Diagnosis not present

## 2021-01-26 NOTE — Progress Notes (Signed)
  Subjective:  Patient ID: [name], female    DOB: July 27, 1951,  MRN: [id number]  Chief Complaint  Patient presents with  . Nail Problem    "they are doing better.  The 3rd toe has been bleeding a little"    70 y.o. female returns with the above complaint. History confirmed with patient.  She is doing well.    Objective:  Physical Exam: warm, good capillary refill, no trophic changes or ulcerative lesions, normal DP and PT pulses and normal sensory exam. Left Foot: Partial nail avulsion matricectomy well-healed  right foot second third toe partial nail avulsion matricectomy's are healing well Assessment:   1. Ingrowing right great toenail   2. Ingrowing left great toenail   3. Post-operative state      Plan:  Patient was evaluated and treated and all questions answered.  Left side is healing well  Ingrown Nail, right -Healing well can discontinue soaks and ointment.  Resume full activity and shoe gear    Return if symptoms worsen or fail to improve.

## 2021-03-07 DIAGNOSIS — Z79899 Other long term (current) drug therapy: Secondary | ICD-10-CM | POA: Diagnosis not present

## 2021-03-07 DIAGNOSIS — E7800 Pure hypercholesterolemia, unspecified: Secondary | ICD-10-CM | POA: Diagnosis not present

## 2021-03-07 DIAGNOSIS — R8290 Unspecified abnormal findings in urine: Secondary | ICD-10-CM | POA: Diagnosis not present

## 2021-03-07 DIAGNOSIS — E039 Hypothyroidism, unspecified: Secondary | ICD-10-CM | POA: Diagnosis not present

## 2021-03-07 DIAGNOSIS — I10 Essential (primary) hypertension: Secondary | ICD-10-CM | POA: Diagnosis not present

## 2021-03-07 DIAGNOSIS — R7309 Other abnormal glucose: Secondary | ICD-10-CM | POA: Diagnosis not present

## 2021-03-14 DIAGNOSIS — I10 Essential (primary) hypertension: Secondary | ICD-10-CM | POA: Diagnosis not present

## 2021-03-14 DIAGNOSIS — E039 Hypothyroidism, unspecified: Secondary | ICD-10-CM | POA: Diagnosis not present

## 2021-03-14 DIAGNOSIS — Z79899 Other long term (current) drug therapy: Secondary | ICD-10-CM | POA: Diagnosis not present

## 2021-03-14 DIAGNOSIS — R5382 Chronic fatigue, unspecified: Secondary | ICD-10-CM | POA: Diagnosis not present

## 2021-03-14 DIAGNOSIS — C50011 Malignant neoplasm of nipple and areola, right female breast: Secondary | ICD-10-CM | POA: Diagnosis not present

## 2021-03-14 DIAGNOSIS — Z1211 Encounter for screening for malignant neoplasm of colon: Secondary | ICD-10-CM | POA: Diagnosis not present

## 2021-03-14 DIAGNOSIS — R7309 Other abnormal glucose: Secondary | ICD-10-CM | POA: Diagnosis not present

## 2021-03-14 DIAGNOSIS — E7800 Pure hypercholesterolemia, unspecified: Secondary | ICD-10-CM | POA: Diagnosis not present

## 2021-03-14 DIAGNOSIS — Z0000 Encounter for general adult medical examination without abnormal findings: Secondary | ICD-10-CM | POA: Diagnosis not present

## 2021-03-18 DIAGNOSIS — Z1211 Encounter for screening for malignant neoplasm of colon: Secondary | ICD-10-CM | POA: Diagnosis not present

## 2021-04-09 ENCOUNTER — Other Ambulatory Visit: Payer: Self-pay

## 2021-04-09 ENCOUNTER — Ambulatory Visit
Admission: RE | Admit: 2021-04-09 | Discharge: 2021-04-09 | Disposition: A | Discharge status: Home / Self Care | Payer: PPO | Source: Ambulatory Visit | Attending: Oncology | Admitting: Oncology

## 2021-04-09 DIAGNOSIS — Z853 Personal history of malignant neoplasm of breast: Secondary | ICD-10-CM | POA: Insufficient documentation

## 2021-04-09 DIAGNOSIS — R922 Inconclusive mammogram: Secondary | ICD-10-CM | POA: Diagnosis not present

## 2021-04-09 DIAGNOSIS — C50911 Malignant neoplasm of unspecified site of right female breast: Secondary | ICD-10-CM

## 2021-04-09 DIAGNOSIS — Z9011 Acquired absence of right breast and nipple: Secondary | ICD-10-CM | POA: Diagnosis not present

## 2021-04-16 DIAGNOSIS — Z853 Personal history of malignant neoplasm of breast: Secondary | ICD-10-CM | POA: Diagnosis not present

## 2021-05-09 ENCOUNTER — Encounter: Payer: Self-pay | Admitting: Oncology

## 2021-05-09 ENCOUNTER — Inpatient Hospital Stay: Payer: PPO | Attending: Oncology | Admitting: Oncology

## 2021-05-09 VITALS — BP 117/82 | HR 99 | Temp 97.9°F | Resp 20 | Wt 209.5 lb

## 2021-05-09 DIAGNOSIS — Z170 Estrogen receptor positive status [ER+]: Secondary | ICD-10-CM | POA: Insufficient documentation

## 2021-05-09 DIAGNOSIS — M8580 Other specified disorders of bone density and structure, unspecified site: Secondary | ICD-10-CM | POA: Insufficient documentation

## 2021-05-09 DIAGNOSIS — C50911 Malignant neoplasm of unspecified site of right female breast: Secondary | ICD-10-CM | POA: Diagnosis not present

## 2021-05-09 DIAGNOSIS — C50411 Malignant neoplasm of upper-outer quadrant of right female breast: Secondary | ICD-10-CM | POA: Insufficient documentation

## 2021-05-09 DIAGNOSIS — Z923 Personal history of irradiation: Secondary | ICD-10-CM | POA: Insufficient documentation

## 2021-05-09 NOTE — Progress Notes (Signed)
[name]  [phone] [phone]) [phone]  ID: Susan Myers OB: Mar 15, 1951  MR#: [id number]  IRC#:[id number]  Patient Care Team: Idelle Crouch, MD as PCP - General (Internal Medicine) Lloyd Huger, MD as Consulting Physician (Oncology)   CHIEF COMPLAINT: Clinical stage IA ER/PR positive, HER-2 negative invasive carcinoma of the upper outer quadrant of the right breast.  INTERVAL HISTORY: Susan Myers returns to clinic today for 43-month follow-up.  She was seen virtually on 11/06/2020.  In the interim, she has been seen by Dr. Sherryle Lis for ingrown toenails and Dr. Delana Meyer for follow-up for previously placed IVC filter.  She is currently on Xarelto.  Denies any bleeding.  Reports no side effects from letrozole.  She had a mammogram on 04/09/2021 which was reported as BI-RADS 2-benign.  REVIEW OF SYSTEMS:   Review of Systems  Constitutional: Negative.  Negative for fever, malaise/fatigue and weight loss.  Respiratory: Negative.  Negative for cough and shortness of breath.   Cardiovascular: Negative.  Negative for chest pain and leg swelling.  Gastrointestinal: Negative.  Negative for abdominal pain.  Genitourinary: Negative.  Negative for dysuria.  Musculoskeletal: Negative.  Negative for back pain.  Skin: Negative.  Negative for rash.  Neurological: Negative.  Negative for sensory change, focal weakness and weakness.  Psychiatric/Behavioral: Negative.  The patient is not nervous/anxious.    As per HPI. Otherwise, a complete review of systems is negative.  PAST MEDICAL HISTORY: Past Medical History:  Diagnosis Date   Actinic keratosis 01/10/2019   mid vertex scalp   Breast cancer ([place]) 2019   invasive mammary carcinoma, lumpectomy and rad tx   Complication of anesthesia    Delusional disorder (HCC)    DVT (deep venous thrombosis) ([place]) 06/2016   GERD (gastroesophageal reflux disease)    History of kidney stones    Hx of  blood clots    Hypercholesterolemia    Hypothyroidism    Kidney stones    Personal history of radiation therapy 2019   right breast cancer   PONV (postoperative nausea and vomiting)    Thyroid disease     PAST SURGICAL HISTORY: Past Surgical History:  Procedure Laterality Date   ABDOMINAL HYSTERECTOMY     APPENDECTOMY     BREAST BIOPSY Right 03/23/2018   Affirm Bx- invasive mammary carcinoma   BREAST CYST ASPIRATION Bilateral 1990   BREAST LUMPECTOMY Right 2019   invasive mammary carcinoma, clear surgical margins   COLONOSCOPY     IVC FILTER INSERTION     PARTIAL MASTECTOMY WITH NEEDLE LOCALIZATION Right 04/01/2018   Procedure: PARTIAL MASTECTOMY WITH NEEDLE LOCALIZATION;  Surgeon: Herbert Pun, MD;  Location: [facility];  Service: General;  Laterality: Right;   PERIPHERAL VASCULAR CATHETERIZATION N/A 08/06/2016   Procedure: IVC Filter Insertion;  Surgeon: Katha Cabal, MD;  Location: [place] [facility];  Service: Cardiovascular;  Laterality: N/A;   PERIPHERAL VASCULAR CATHETERIZATION Left 08/06/2016   Procedure: Lower Extremity Venography;  Surgeon: Katha Cabal, MD;  Location: [place] [facility];  Service: Cardiovascular;  Laterality: Left;   PERIPHERAL VASCULAR CATHETERIZATION  08/06/2016   Procedure: Lower Extremity Intervention;  Surgeon: Katha Cabal, MD;  Location: [place] [facility];  Service: Cardiovascular;;   PERIPHERAL VASCULAR CATHETERIZATION N/A 12/16/2016   Procedure: IVC Filter Removal;  Surgeon: Katha Cabal, MD;  Location: [place] [facility];  Service: Cardiovascular;  Laterality: N/A;   SENTINEL NODE BIOPSY Right 04/01/2018   Procedure: SENTINEL NODE BIOPSY;  Surgeon:  Herbert Pun, MD;  Location: [facility];  Service: General;  Laterality: Right;   TONSILLECTOMY      FAMILY HISTORY: Family History  Problem Relation Age of Onset   Breast cancer Neg Hx    Prostate cancer Neg Hx    Kidney cancer Neg Hx     ADVANCED  DIRECTIVES (Y/N):  N  HEALTH MAINTENANCE: Social History   Tobacco Use   Smoking status: Never   Smokeless tobacco: Never  Vaping Use   Vaping Use: Never used  Substance Use Topics   Alcohol use: No   Drug use: No     Colonoscopy:  PAP:  Bone density:  Lipid panel:  Allergies  Allergen Reactions   Levaquin [Levofloxacin In D5w] Hives   Macrolides And Ketolides Other (See Comments)    WHELPS WHELPS WHELPS   Penicillins Itching    Can take amoxicillin  Has patient had a PCN reaction causing immediate rash, facial/tongue/throat swelling, SOB or lightheadedness with hypotension:No Has patient had a PCN reaction causing severe rash involving mucus membranes or skin necrosis:unsure Has patient had a PCN reaction that required hospitalization:No Has patient had a PCN reaction occurring within the last 10 years: No If all of the above answers are "NO", then may proceed with Cephalosporin use.   Sulfa Antibiotics Hives    Current Outpatient Medications  Medication Sig Dispense Refill   acetaminophen (TYLENOL) 500 MG tablet Take 500 mg by mouth every 6 (six) hours as needed for moderate pain or headache.     amoxicillin (AMOXIL) 500 MG capsule Take 500 mg by mouth 2 (two) times daily.     Cholecalciferol (VITAMIN D PO) Take 1 tablet by mouth once a week.     hydrochlorothiazide (HYDRODIURIL) 25 MG tablet Take 25 mg by mouth daily.     letrozole (FEMARA) 2.5 MG tablet Take 1 tablet (2.5 mg total) by mouth daily. 90 tablet 3   levothyroxine (SYNTHROID) 150 MCG tablet Take by mouth.     meloxicam (MOBIC) 7.5 MG tablet Take 7.5 mg by mouth daily as needed for pain.     neomycin-polymyxin-hydrocortisone (CORTISPORIN) 3.5-10000-1 OTIC suspension Apply 1-2 drops daily after soaking and cover with bandaid 10 mL 0   phenylephrine (SUDAFED PE) 10 MG TABS tablet Take 10 mg by mouth every 6 (six) hours as needed (congestion).     risperiDONE (RISPERDAL) 1 MG tablet Take 1 mg by mouth at  bedtime.     rivaroxaban (XARELTO) 20 MG TABS tablet Take 20 mg by mouth daily.     simvastatin (ZOCOR) 20 MG tablet Take 20 mg by mouth every evening.      No current facility-administered medications for this visit.    OBJECTIVE: Vitals:   05/09/21 1022  BP: 117/82  Pulse: 99  Resp: 20  Temp: 97.9 F (36.6 C)     Body mass index is 38.32 kg/m.    ECOG FS:0 - Asymptomatic  Physical Exam Constitutional:      Appearance: Normal appearance.  HENT:     Head: Normocephalic and atraumatic.  Eyes:     Pupils: Pupils are equal, round, and reactive to light.  Cardiovascular:     Rate and Rhythm: Normal rate and regular rhythm.     Heart sounds: Normal heart sounds. No murmur heard. Pulmonary:     Effort: Pulmonary effort is normal.     Breath sounds: Normal breath sounds. No wheezing.  Abdominal:     General: Bowel sounds are normal. There is  no distension.     Palpations: Abdomen is soft.     Tenderness: There is no abdominal tenderness.  Musculoskeletal:        General: Normal range of motion.     Cervical back: Normal range of motion.  Skin:    General: Skin is warm and dry.     Findings: No rash.  Neurological:     Mental Status: She is alert and oriented to person, place, and time.  Psychiatric:        Judgment: Judgment normal.     LAB RESULTS:  Lab Results  Component Value Date   NA 143 11/23/2015   K 3.4 (L) 11/23/2015   CL 105 11/23/2015   CO2 28 11/23/2015   GLUCOSE 101 (H) 11/23/2015   BUN 23 (H) 08/06/2016   CREATININE 0.88 08/06/2016   CALCIUM 9.7 11/23/2015   PROT 7.5 11/23/2015   ALBUMIN 4.8 11/23/2015   AST 22 11/23/2015   ALT 24 11/23/2015   ALKPHOS 88 11/23/2015   BILITOT 0.8 11/23/2015   GFRNONAA >60 08/06/2016   GFRAA >60 08/06/2016    Lab Results  Component Value Date   WBC 5.5 06/15/2018   HGB 14.5 06/15/2018   HCT 42.7 06/15/2018   MCV 93.7 06/15/2018   PLT 168 06/15/2018     STUDIES: No results found.  ASSESSMENT:  Clinical stage IA ER/PR positive, HER-2 negative invasive carcinoma of the upper outer quadrant of the right breast.  Oncotype DX score 6, low risk.  PLAN:    1. Clinical stage IA ER/PR positive, HER-2 negative invasive carcinoma of the upper outer quadrant of the right breast:  -Diagnosed back in April 2019. -Had lumpectomy on 04/01/2018 confirming stage of disease. -Oncotype DX score of 6 -She did not require adjuvant chemotherapy -Had adjuvant XRT which she completed August 2019.  -Began letrozole in August 2019. -Most recent mammogram is from 04/09/21 which was read as BI-RADS 2-benign -Repeat mammogram in May 2023.  2.  Osteopenia: -DEXA scan completed on 07/18/2020 revealed T score of -2.1 (-1.8). -She is currently on calcium and vitamin D. -Repeat DEXA in August 2023.  Disposition: -RTC in 6 months for follow-up.  Patient would prefer if this was done virtually.  Greater than 50% was spent in counseling and coordination of care with this patient including but not limited to discussion of the relevant topics above (See A&P) including, but not limited to diagnosis and management of acute and chronic medical conditions.   Patient expressed understanding and was in agreement with this plan. She also understands that She can call clinic at any time with any questions, concerns, or complaints.   Cancer Staging Primary cancer of upper outer quadrant of right female breast [facility]) Staging form: Breast, AJCC 8th Edition - Clinical stage from 03/26/2018: Stage IA (cT1b, cN0, cM0, G2, ER+, PR+, HER2-) - Signed by Lloyd Huger, MD on 03/26/2018 Histologic grading system: 3 grade system Laterality: Right   Jacquelin Hawking, NP   05/09/2021 10:35 AM

## 2021-05-09 NOTE — Progress Notes (Signed)
Patient denies any concerns today.  

## 2021-06-21 DIAGNOSIS — Z79899 Other long term (current) drug therapy: Secondary | ICD-10-CM | POA: Diagnosis not present

## 2021-06-21 DIAGNOSIS — E039 Hypothyroidism, unspecified: Secondary | ICD-10-CM | POA: Diagnosis not present

## 2021-06-21 DIAGNOSIS — I10 Essential (primary) hypertension: Secondary | ICD-10-CM | POA: Diagnosis not present

## 2021-06-21 DIAGNOSIS — R7309 Other abnormal glucose: Secondary | ICD-10-CM | POA: Diagnosis not present

## 2021-07-08 ENCOUNTER — Ambulatory Visit: Payer: PPO | Admitting: Radiation Oncology

## 2021-07-24 ENCOUNTER — Other Ambulatory Visit: Payer: Self-pay

## 2021-07-24 ENCOUNTER — Encounter: Payer: Self-pay | Admitting: Podiatry

## 2021-07-24 ENCOUNTER — Ambulatory Visit: Payer: PPO | Admitting: Podiatry

## 2021-07-24 DIAGNOSIS — L600 Ingrowing nail: Secondary | ICD-10-CM | POA: Diagnosis not present

## 2021-07-24 MED ORDER — NEOMYCIN-POLYMYXIN-HC 3.5-10000-1 OT SUSP: OTIC | 0 refills | Status: DC

## 2021-07-24 NOTE — Patient Instructions (Signed)

## 2021-07-24 NOTE — Progress Notes (Signed)
  Subjective:  Patient ID: Susan Myers, female    DOB: 1951/08/12,  MRN: [id number]  Chief Complaint  Patient presents with   Ingrown Toenail    Ingrown right great toenail, lateral border    70 y.o. female presents with the above complaint. History confirmed with patient.  She has new ingrown on the right hallux lateral border the other toenails are doing well  Objective:  Physical Exam: warm, good capillary refill, no trophic changes or ulcerative lesions, normal DP and PT pulses, and normal sensory exam. Left Foot: normal exam, no swelling, tenderness, instability; ligaments intact, full range of motion of all ankle/foot joints Right Foot:  Ingrown nail lateral border with creation of the medial border of all the hallux  Assessment:   1. Ingrowing right great toenail      Plan:  Patient was evaluated and treated and all questions answered.    Ingrown Nail, right -Patient elects to proceed with minor surgery to remove ingrown toenail today. Consent reviewed and signed by patient. -Ingrown nail excised. See procedure note. -Educated on post-procedure care including soaking. Written instructions provided and reviewed. Cortisporin drops sent to pharmacy Procedure: Excision of Ingrown Toenail Location: Right 1st toe  bilateral  nail borders. Anesthesia: Lidocaine 1% plain; 1.5 mL and Marcaine 0.5% plain; 1.5 mL, digital block. Skin Prep: Betadine. Dressing: Silvadene; telfa; dry, sterile, compression dressing. Technique: Following skin prep, the toe was exsanguinated and a tourniquet was secured at the base of the toe. The affected nail border was freed, split with a nail splitter, and excised. Chemical matrixectomy was then performed with phenol and irrigated out with alcohol. The tourniquet was then removed and sterile dressing applied. Disposition: Patient tolerated procedure well.   Return if symptoms worsen or fail to improve.

## 2021-07-25 DIAGNOSIS — Z79899 Other long term (current) drug therapy: Secondary | ICD-10-CM | POA: Diagnosis not present

## 2021-07-25 DIAGNOSIS — I82402 Acute embolism and thrombosis of unspecified deep veins of left lower extremity: Secondary | ICD-10-CM | POA: Diagnosis not present

## 2021-07-25 DIAGNOSIS — E785 Hyperlipidemia, unspecified: Secondary | ICD-10-CM | POA: Diagnosis not present

## 2021-07-25 DIAGNOSIS — R7309 Other abnormal glucose: Secondary | ICD-10-CM | POA: Diagnosis not present

## 2021-07-25 DIAGNOSIS — E7800 Pure hypercholesterolemia, unspecified: Secondary | ICD-10-CM | POA: Diagnosis not present

## 2021-07-25 DIAGNOSIS — C50011 Malignant neoplasm of nipple and areola, right female breast: Secondary | ICD-10-CM | POA: Diagnosis not present

## 2021-07-25 DIAGNOSIS — E039 Hypothyroidism, unspecified: Secondary | ICD-10-CM | POA: Diagnosis not present

## 2021-07-25 DIAGNOSIS — I10 Essential (primary) hypertension: Secondary | ICD-10-CM | POA: Diagnosis not present

## 2021-08-01 ENCOUNTER — Telehealth: Payer: Self-pay

## 2021-08-01 ENCOUNTER — Ambulatory Visit: Payer: PPO | Admitting: Dermatology

## 2021-08-01 ENCOUNTER — Other Ambulatory Visit: Payer: Self-pay

## 2021-08-01 DIAGNOSIS — Z1283 Encounter for screening for malignant neoplasm of skin: Secondary | ICD-10-CM | POA: Diagnosis not present

## 2021-08-01 DIAGNOSIS — L718 Other rosacea: Secondary | ICD-10-CM

## 2021-08-01 DIAGNOSIS — D229 Melanocytic nevi, unspecified: Secondary | ICD-10-CM

## 2021-08-01 DIAGNOSIS — L821 Other seborrheic keratosis: Secondary | ICD-10-CM | POA: Diagnosis not present

## 2021-08-01 DIAGNOSIS — L814 Other melanin hyperpigmentation: Secondary | ICD-10-CM

## 2021-08-01 DIAGNOSIS — L719 Rosacea, unspecified: Secondary | ICD-10-CM

## 2021-08-01 DIAGNOSIS — Z872 Personal history of diseases of the skin and subcutaneous tissue: Secondary | ICD-10-CM | POA: Diagnosis not present

## 2021-08-01 DIAGNOSIS — L659 Nonscarring hair loss, unspecified: Secondary | ICD-10-CM | POA: Diagnosis not present

## 2021-08-01 DIAGNOSIS — L578 Other skin changes due to chronic exposure to nonionizing radiation: Secondary | ICD-10-CM | POA: Diagnosis not present

## 2021-08-01 NOTE — Telephone Encounter (Signed)
Patient advised she can not use eye drops.

## 2021-08-01 NOTE — Patient Instructions (Signed)

## 2021-08-01 NOTE — Telephone Encounter (Signed)
Patient advised and RX to be sent in.  Allergy coming up that patient is allergic to Macrolides and Ketolides. Still okay to send in?

## 2021-08-01 NOTE — Telephone Encounter (Signed)
Patient left voicemail on the nurse line that she would like the eye drops at this time. Can you please advise of RX? Thank you

## 2021-08-01 NOTE — Progress Notes (Signed)
   Follow-Up Visit   Subjective  Susan Myers is a 70 y.o. female who presents for the following: Annual Exam (Patient here today for full body exam . She reports no new concerns. ).biopsy proven PreCa AK with history of prolonged ulcer in site treated for months, ultimately with healing -- now doing well Patient here for full body skin exam and skin cancer screening.  The following portions of the chart were reviewed this encounter and updated as appropriate:  Tobacco  Allergies  Meds  Problems  Med Hx  Surg Hx  Fam Hx      Objective  Well appearing patient in no apparent distress; mood and affect are within normal limits.  A full examination was performed including scalp, head, eyes, ears, nose, lips, neck, chest, axillae, abdomen, back, buttocks, bilateral upper extremities, bilateral lower extremities, hands, feet, fingers, toes, fingernails, and toenails. All findings within normal limits unless otherwise noted below.  eyes and cheeks Erythema of the eyes and minimally of cheeks   mid vertex scalp 3.5 x 2 cm crusted patch        Assessment & Plan  Rosacea eyes and cheeks With occular rosacea   Rosacea is a chronic progressive skin condition usually affecting the face of adults, causing redness and/or acne bumps. It is treatable but not curable. It sometimes affects the eyes (ocular rosacea) as well. It may respond to topical and/or systemic medication and can flare with stress, sun exposure, alcohol, exercise and some foods.  Daily application of broad spectrum spf 30+ sunscreen to face is recommended to reduce flares.  Oral treatment with doxycycline and discussed eye drops  Discussed need for follow up with ophthalmologist eyes are bothering patient. Patient deferred any treatment for eyes at this time. Will continue to monitor.   Alopecia Mid Frontal Scalp Alopecia of scalp at site of biopsy proven PreCa AK with history of prolonged ulcer in site treated  for months, ultimately with healing -- now doing well  Lentigines - Scattered tan macules - Due to sun exposure - Benign-appering, observe - Recommend daily broad spectrum sunscreen SPF 30+ to sun-exposed areas, reapply every 2 hours as needed. - Call for any changes  Seborrheic Keratoses - Stuck-on, waxy, tan-brown papules and/or plaques  - Benign-appearing - Discussed benign etiology and prognosis. - Observe - Call for any changes  Melanocytic Nevi - Tan-brown and/or pink-flesh-colored symmetric macules and papules - Benign appearing on exam today - Observation - Call clinic for new or changing moles - Recommend daily use of broad spectrum spf 30+ sunscreen to sun-exposed areas.   Hemangiomas - Red papules - Discussed benign nature - Observe - Call for any changes  Actinic Damage - Chronic condition, secondary to cumulative UV/sun exposure - diffuse scaly erythematous macules with underlying dyspigmentation - Recommend daily broad spectrum sunscreen SPF 30+ to sun-exposed areas, reapply every 2 hours as needed.  - Staying in the shade or wearing long sleeves, sun glasses (UVA+UVB protection) and wide brim hats (4-inch brim around the entire circumference of the hat) are also recommended for sun protection.  - Call for new or changing lesions.  Skin cancer screening performed today.  Return in about 1 year (around 08/01/2022) for tbse.  IRuthell Rummage, CMA, am acting as scribe for Sarina Ser, MD.  Documentation: I have reviewed the above documentation for accuracy and completeness, and I agree with the above.  Sarina Ser, MD

## 2021-08-05 ENCOUNTER — Encounter: Payer: Self-pay | Admitting: Dermatology

## 2021-10-23 ENCOUNTER — Other Ambulatory Visit: Payer: Self-pay

## 2021-10-23 ENCOUNTER — Ambulatory Visit: Payer: PPO | Admitting: Podiatry

## 2021-10-23 DIAGNOSIS — L600 Ingrowing nail: Secondary | ICD-10-CM | POA: Diagnosis not present

## 2021-10-23 MED ORDER — DOXYCYCLINE HYCLATE 100 MG PO TABS: 100.0000 mg | ORAL_TABLET | Freq: Two times a day (BID) | ORAL | 0 refills | Status: AC

## 2021-10-23 MED ORDER — NEOMYCIN-POLYMYXIN-HC 3.5-10000-1 OT SUSP: OTIC | 0 refills | Status: DC

## 2021-10-23 NOTE — Progress Notes (Signed)
  Subjective:  Patient ID: Susan Myers, female    DOB: [birth year],  MRN: [id number]  Chief Complaint  Patient presents with   Ingrown Toenail      possible ingrown toe nail right foot     70 y.o. female returns with the above complaint. History confirmed with patient.  Think some of this may have regrown  Objective:  Physical Exam: warm, good capillary refill, no trophic changes or ulcerative lesions, normal DP and PT pulses, and normal sensory exam. Left Foot: normal exam, no swelling, tenderness, instability; ligaments intact, full range of motion of all ankle/foot joints Right Foot: Recurrence of ingrowing nail with paronychia on the medial border, lateral border is well-healed and no recurrent ingrown here  Assessment:   1. Ingrowing nail, right great toe      Plan:  Patient was evaluated and treated and all questions answered.    Ingrown Nail, right -Unfortunate has had recurrence of ingrowing nail on the medial border matricectomy did not take -Patient elects to proceed with minor surgery to remove ingrown toenail today. Consent reviewed and signed by patient. -Ingrown nail excised. See procedure note. -Educated on post-procedure care including soaking. Written instructions provided and reviewed. -Rx for Cortisporin and 5 days doxycycline sent to pharmacy  Procedure: Excision of Ingrown Toenail Location: Right 1st toe medial nail borders. Anesthesia: Lidocaine 1% plain; 1.5 mL and Marcaine 0.5% plain; 1.5 mL, digital block. Skin Prep: Betadine. Dressing: Silvadene; telfa; dry, sterile, compression dressing. Technique: Following skin prep, the toe was exsanguinated and a tourniquet was secured at the base of the toe. The affected nail border was freed, split with a nail splitter, and excised. Chemical matrixectomy was then performed with phenol and irrigated out with alcohol. The tourniquet was then removed and sterile dressing applied. Disposition: Patient  tolerated procedure well.   No follow-ups on file.

## 2021-10-23 NOTE — Patient Instructions (Signed)

## 2021-11-06 ENCOUNTER — Inpatient Hospital Stay: Payer: PPO | Attending: Oncology | Admitting: Oncology

## 2021-11-06 ENCOUNTER — Other Ambulatory Visit: Payer: Self-pay

## 2021-11-06 DIAGNOSIS — C50411 Malignant neoplasm of upper-outer quadrant of right female breast: Secondary | ICD-10-CM

## 2021-11-06 NOTE — Progress Notes (Signed)
Patient feels nauseated today.

## 2021-11-06 NOTE — Progress Notes (Signed)
[facility]  [phone] [phone]) [phone]  ID: Susan Myers OB: [birth year]  MR#: [id number]  QIH#:[id number]  Patient Care Team: Idelle Crouch, MD as PCP - General (Internal Medicine) Lloyd Huger, MD as Consulting Physician (Oncology)  I connected with Susan Myers on 11/07/21 at 10:00 AM EST by video enabled telemedicine visit and verified that I am speaking with the correct person using two identifiers.   I discussed the limitations, risks, security and privacy concerns of performing an evaluation and management service by telemedicine and the availability of in-person appointments. I also discussed with the patient that there may be a patient responsible charge related to this service. The patient expressed understanding and agreed to proceed.   Other persons participating in the visit and their role in the encounter: Patient, MD.  Patients location: Home. Providers location: Clinic.  CHIEF COMPLAINT: Clinical stage IA ER/PR positive, HER-2 negative invasive carcinoma of the upper outer quadrant of the right breast.  INTERVAL HISTORY: Patient agreed to video enabled telemedicine visit for routine 45-month evaluation.  She continues to feel well and remains asymptomatic.  She is tolerating letrozole well without significant side effects.  She has no neurologic complaints.  She denies any recent fevers or illnesses.  She has a good appetite and denies weight loss.  She denies any chest pain, shortness of breath, cough, or hemoptysis.  She denies any nausea, vomiting, constipation, or diarrhea.  She has no urinary complaints.  Patient feels at her baseline offers no specific complaints today.  REVIEW OF SYSTEMS:   Review of Systems  Constitutional: Negative.  Negative for fever, malaise/fatigue and weight loss.  Respiratory: Negative.  Negative for cough and shortness of breath.   Cardiovascular: Negative.  Negative for chest pain  and leg swelling.  Gastrointestinal: Negative.  Negative for abdominal pain.  Genitourinary: Negative.  Negative for dysuria.  Musculoskeletal: Negative.  Negative for back pain.  Skin: Negative.  Negative for rash.  Neurological: Negative.  Negative for sensory change, focal weakness and weakness.  Psychiatric/Behavioral: Negative.  The patient is not nervous/anxious.    As per HPI. Otherwise, a complete review of systems is negative.  PAST MEDICAL HISTORY: Past Medical History:  Diagnosis Date   Actinic keratosis 01/10/2019   mid vertex scalp   Breast cancer ([place]) 2019   invasive mammary carcinoma, lumpectomy and rad tx   Complication of anesthesia    Delusional disorder ([place])    DVT (deep venous thrombosis) ([place]) 06/2016   Dysplastic nevus 07/24/2020   R med calf - mod   GERD (gastroesophageal reflux disease)    History of kidney stones    Hx of blood clots    Hypercholesterolemia    Hypothyroidism    Kidney stones    Personal history of radiation therapy 2019   right breast cancer   PONV (postoperative nausea and vomiting)    Thyroid disease     PAST SURGICAL HISTORY: Past Surgical History:  Procedure Laterality Date   ABDOMINAL HYSTERECTOMY     APPENDECTOMY     BREAST BIOPSY Right 03/23/2018   Affirm Bx- invasive mammary carcinoma   BREAST CYST ASPIRATION Bilateral 1990   BREAST LUMPECTOMY Right 2019   invasive mammary carcinoma, clear surgical margins   COLONOSCOPY     IVC FILTER INSERTION     PARTIAL MASTECTOMY WITH NEEDLE LOCALIZATION Right 04/01/2018   Procedure: PARTIAL MASTECTOMY WITH NEEDLE LOCALIZATION;  Surgeon: Herbert Pun, MD;  Location: [facility];  Service: General;  Laterality: Right;   PERIPHERAL VASCULAR CATHETERIZATION N/A 08/06/2016   Procedure: IVC Filter Insertion;  Surgeon: Katha Cabal, MD;  Location: [facility];  Service: Cardiovascular;  Laterality: N/A;   PERIPHERAL VASCULAR CATHETERIZATION Left 08/06/2016    Procedure: Lower Extremity Venography;  Surgeon: Katha Cabal, MD;  Location: [facility];  Service: Cardiovascular;  Laterality: Left;   PERIPHERAL VASCULAR CATHETERIZATION  08/06/2016   Procedure: Lower Extremity Intervention;  Surgeon: Katha Cabal, MD;  Location: [facility];  Service: Cardiovascular;;   PERIPHERAL VASCULAR CATHETERIZATION N/A 12/16/2016   Procedure: IVC Filter Removal;  Surgeon: Katha Cabal, MD;  Location: [facility];  Service: Cardiovascular;  Laterality: N/A;   SENTINEL NODE BIOPSY Right 04/01/2018   Procedure: SENTINEL NODE BIOPSY;  Surgeon: Herbert Pun, MD;  Location: [facility];  Service: General;  Laterality: Right;   TONSILLECTOMY      FAMILY HISTORY: Family History  Problem Relation Age of Onset   Breast cancer Neg Hx    Prostate cancer Neg Hx    Kidney cancer Neg Hx     ADVANCED DIRECTIVES (Y/N):  N  HEALTH MAINTENANCE: Social History   Tobacco Use   Smoking status: Never   Smokeless tobacco: Never  Vaping Use   Vaping Use: Never used  Substance Use Topics   Alcohol use: No   Drug use: No     Colonoscopy:  PAP:  Bone density:  Lipid panel:  Allergies  Allergen Reactions   Levaquin [Levofloxacin In D5w] Hives   Macrolides And Ketolides Other (See Comments)    WHELPS WHELPS WHELPS   Penicillins Itching    Can take amoxicillin  Has patient had a PCN reaction causing immediate rash, facial/tongue/throat swelling, SOB or lightheadedness with hypotension:No Has patient had a PCN reaction causing severe rash involving mucus membranes or skin necrosis:unsure Has patient had a PCN reaction that required hospitalization:No Has patient had a PCN reaction occurring within the last 10 years: No If all of the above answers are "NO", then may proceed with Cephalosporin use.   Sulfa Antibiotics Hives    Current Outpatient Medications  Medication Sig Dispense Refill   acetaminophen (TYLENOL) 500 MG  tablet Take 500 mg by mouth every 6 (six) hours as needed for moderate pain or headache.     Cholecalciferol (VITAMIN D PO) Take 1 tablet by mouth once a week.     hydrochlorothiazide (HYDRODIURIL) 25 MG tablet Take 25 mg by mouth daily.     letrozole (FEMARA) 2.5 MG tablet Take 1 tablet (2.5 mg total) by mouth daily. 90 tablet 3   levothyroxine (SYNTHROID) 125 MCG tablet Take 125 mcg by mouth daily.     meloxicam (MOBIC) 7.5 MG tablet Take 7.5 mg by mouth daily as needed for pain.     risperiDONE (RISPERDAL) 1 MG tablet Take 1 mg by mouth at bedtime.     rivaroxaban (XARELTO) 20 MG TABS tablet Take 20 mg by mouth daily.     simvastatin (ZOCOR) 20 MG tablet Take 20 mg by mouth every evening.      neomycin-polymyxin-hydrocortisone (CORTISPORIN) 3.5-10000-1 OTIC suspension Apply 1-2 drops daily after soaking and cover with bandaid (Patient not taking: Reported on 11/06/2021) 10 mL 0   phenylephrine (SUDAFED PE) 10 MG TABS tablet Take 10 mg by mouth every 6 (six) hours as needed (congestion). (Patient not taking: Reported on 11/06/2021)     No current facility-administered medications for this visit.  OBJECTIVE: There were no vitals filed for this visit.   There is no height or weight on file to calculate BMI.    ECOG FS:0 - Asymptomatic  General: Well-developed, well-nourished, no acute distress. HEENT: Normocephalic. Neuro: Alert, answering all questions appropriately. Cranial nerves grossly intact. Psych: Normal affect.   LAB RESULTS:  Lab Results  Component Value Date   NA 143 11/23/2015   K 3.4 (L) 11/23/2015   CL 105 11/23/2015   CO2 28 11/23/2015   GLUCOSE 101 (H) 11/23/2015   BUN 23 (H) 08/06/2016   CREATININE 0.88 08/06/2016   CALCIUM 9.7 11/23/2015   PROT 7.5 11/23/2015   ALBUMIN 4.8 11/23/2015   AST 22 11/23/2015   ALT 24 11/23/2015   ALKPHOS 88 11/23/2015   BILITOT 0.8 11/23/2015   GFRNONAA >60 08/06/2016   GFRAA >60 08/06/2016    Lab Results  Component Value  Date   WBC 5.5 06/15/2018   HGB 14.5 06/15/2018   HCT 42.7 06/15/2018   MCV 93.7 06/15/2018   PLT 168 06/15/2018     STUDIES: No results found.  ASSESSMENT: Clinical stage IA ER/PR positive, HER-2 negative invasive carcinoma of the upper outer quadrant of the right breast.  Oncotype DX score 6, low risk.  PLAN:    1. Clinical stage IA ER/PR positive, HER-2 negative invasive carcinoma of the upper outer quadrant of the right breast: Patient underwent lumpectomy on Apr 01, 2018 confirming the stage of disease.  She had an Oncotype DX score of 6 which confers a low risk therefore patient did not require adjuvant chemotherapy.  She completed adjuvant XRT in approximately August 2019.  Continue letrozole for a total of 5 years completing in August 2024.  Her most recent mammogram on Apr 09, 2021 was reported BI-RADS 2.  Repeat in May 2023.  Return to clinic in 6 months with video assisted telemedicine visit.   2.  Osteopenia: Repeat bone mineral density on July 14, 2019 reported T score of -1.8.  This is mildly improved from 1 year prior when the T score was reported at -1.9.  Continue calcium and vitamin D supplementation.  Repeat bone mineral density along with mammogram in May 2023.  I provided 20 minutes of face-to-face video visit time during this encounter which included chart review, counseling, and coordination of care as documented above.    Patient expressed understanding and was in agreement with this plan. She also understands that She can call clinic at any time with any questions, concerns, or complaints.    Cancer Staging  Primary cancer of upper outer quadrant of right female breast [facility]) Staging form: Breast, AJCC 8th Edition - Clinical stage from 03/26/2018: Stage IA (cT1b, cN0, cM0, G2, ER+, PR+, HER2-) - Signed by Lloyd Huger, MD on 03/26/2018 Histologic grading system: 3 grade system Laterality: Right   Lloyd Huger, MD   11/07/2021 5:55 AM

## 2021-11-07 ENCOUNTER — Telehealth: Payer: PPO | Admitting: Oncology

## 2021-11-11 ENCOUNTER — Other Ambulatory Visit: Payer: Self-pay | Admitting: *Deleted

## 2021-11-11 DIAGNOSIS — C50911 Malignant neoplasm of unspecified site of right female breast: Secondary | ICD-10-CM

## 2021-11-11 MED ORDER — LETROZOLE 2.5 MG PO TABS: 2.5000 mg | ORAL_TABLET | Freq: Every day | ORAL | 3 refills | Status: DC

## 2021-12-10 DIAGNOSIS — R8290 Unspecified abnormal findings in urine: Secondary | ICD-10-CM | POA: Diagnosis not present

## 2021-12-10 DIAGNOSIS — E7800 Pure hypercholesterolemia, unspecified: Secondary | ICD-10-CM | POA: Diagnosis not present

## 2021-12-10 DIAGNOSIS — R7309 Other abnormal glucose: Secondary | ICD-10-CM | POA: Diagnosis not present

## 2021-12-10 DIAGNOSIS — E039 Hypothyroidism, unspecified: Secondary | ICD-10-CM | POA: Diagnosis not present

## 2021-12-10 DIAGNOSIS — I10 Essential (primary) hypertension: Secondary | ICD-10-CM | POA: Diagnosis not present

## 2021-12-10 DIAGNOSIS — Z79899 Other long term (current) drug therapy: Secondary | ICD-10-CM | POA: Diagnosis not present

## 2021-12-17 DIAGNOSIS — I82512 Chronic embolism and thrombosis of left femoral vein: Secondary | ICD-10-CM | POA: Diagnosis not present

## 2021-12-17 DIAGNOSIS — E039 Hypothyroidism, unspecified: Secondary | ICD-10-CM | POA: Diagnosis not present

## 2021-12-17 DIAGNOSIS — E7800 Pure hypercholesterolemia, unspecified: Secondary | ICD-10-CM | POA: Diagnosis not present

## 2021-12-17 DIAGNOSIS — C50011 Malignant neoplasm of nipple and areola, right female breast: Secondary | ICD-10-CM | POA: Diagnosis not present

## 2021-12-17 DIAGNOSIS — E785 Hyperlipidemia, unspecified: Secondary | ICD-10-CM | POA: Diagnosis not present

## 2021-12-17 DIAGNOSIS — R7309 Other abnormal glucose: Secondary | ICD-10-CM | POA: Diagnosis not present

## 2021-12-17 DIAGNOSIS — Z79899 Other long term (current) drug therapy: Secondary | ICD-10-CM | POA: Diagnosis not present

## 2021-12-17 DIAGNOSIS — Z0000 Encounter for general adult medical examination without abnormal findings: Secondary | ICD-10-CM | POA: Diagnosis not present

## 2021-12-17 DIAGNOSIS — I10 Essential (primary) hypertension: Secondary | ICD-10-CM | POA: Diagnosis not present

## 2022-01-20 ENCOUNTER — Other Ambulatory Visit: Payer: Self-pay

## 2022-01-20 ENCOUNTER — Ambulatory Visit (INDEPENDENT_AMBULATORY_CARE_PROVIDER_SITE_OTHER): Payer: PPO | Admitting: Vascular Surgery

## 2022-01-20 ENCOUNTER — Encounter (INDEPENDENT_AMBULATORY_CARE_PROVIDER_SITE_OTHER): Payer: Self-pay | Admitting: Vascular Surgery

## 2022-01-20 ENCOUNTER — Ambulatory Visit (INDEPENDENT_AMBULATORY_CARE_PROVIDER_SITE_OTHER): Payer: PPO

## 2022-01-20 VITALS — BP 136/85 | HR 60 | Resp 16 | Wt 201.6 lb

## 2022-01-20 DIAGNOSIS — I872 Venous insufficiency (chronic) (peripheral): Secondary | ICD-10-CM

## 2022-01-20 DIAGNOSIS — I82513 Chronic embolism and thrombosis of femoral vein, bilateral: Secondary | ICD-10-CM | POA: Diagnosis not present

## 2022-01-20 DIAGNOSIS — I10 Essential (primary) hypertension: Secondary | ICD-10-CM

## 2022-01-20 DIAGNOSIS — I890 Lymphedema, not elsewhere classified: Secondary | ICD-10-CM | POA: Diagnosis not present

## 2022-01-20 DIAGNOSIS — I82512 Chronic embolism and thrombosis of left femoral vein: Secondary | ICD-10-CM | POA: Diagnosis not present

## 2022-01-20 DIAGNOSIS — E7800 Pure hypercholesterolemia, unspecified: Secondary | ICD-10-CM | POA: Diagnosis not present

## 2022-01-20 NOTE — Progress Notes (Signed)
MRN : [id number]  Susan Myers is a 71 y.o. (Dec 23, 1950) female who presents with chief complaint of check circulation.  History of Present Illness:   The patient returns to the office for followup regarding her IVC filter.  She is status post angiogram with attempted IVC filter removal on 12/16/2016.  Hematuria has not recurred and she is now back on her Xarelto.   Swelling of her left leg remains stable and "not much" at this time.   There have been no significant changes to the patient's overall health care.   The patient denies amaurosis fugax or recent TIA symptoms. There are no recent neurological changes noted. The patient denies history of DVT, PE or superficial thrombophlebitis. The patient denies recent episodes of angina or shortness of breath.     Duplex ultrasound of the IVC is negative for clot normal flow pattern is identified. No significant change compared to previous study.  Current Meds  Medication Sig   acetaminophen (TYLENOL) 500 MG tablet Take 500 mg by mouth every 6 (six) hours as needed for moderate pain or headache.   Cholecalciferol (VITAMIN D PO) Take 1 tablet by mouth once a week.   hydrochlorothiazide (HYDRODIURIL) 25 MG tablet Take 25 mg by mouth daily.   letrozole (FEMARA) 2.5 MG tablet Take 1 tablet (2.5 mg total) by mouth daily.   levothyroxine (SYNTHROID) 125 MCG tablet Take 125 mcg by mouth daily.   meloxicam (MOBIC) 7.5 MG tablet Take 7.5 mg by mouth daily as needed for pain.   risperiDONE (RISPERDAL) 1 MG tablet Take 1 mg by mouth at bedtime.   rivaroxaban (XARELTO) 20 MG TABS tablet Take 20 mg by mouth daily.   simvastatin (ZOCOR) 20 MG tablet Take 20 mg by mouth every evening.     Past Medical History:  Diagnosis Date   Actinic keratosis 01/10/2019   mid vertex scalp   Breast cancer ([place]) 2019   invasive mammary carcinoma, lumpectomy and rad tx   Complication of anesthesia    Delusional disorder ([place])    DVT (deep venous  thrombosis) ([place]) 06/2016   Dysplastic nevus 07/24/2020   R med calf - mod   GERD (gastroesophageal reflux disease)    History of kidney stones    Hx of blood clots    Hypercholesterolemia    Hypothyroidism    Kidney stones    Personal history of radiation therapy 2019   right breast cancer   PONV (postoperative nausea and vomiting)    Thyroid disease     Past Surgical History:  Procedure Laterality Date   ABDOMINAL HYSTERECTOMY     APPENDECTOMY     BREAST BIOPSY Right 03/23/2018   Affirm Bx- invasive mammary carcinoma   BREAST CYST ASPIRATION Bilateral 1990   BREAST LUMPECTOMY Right 2019   invasive mammary carcinoma, clear surgical margins   COLONOSCOPY     IVC FILTER INSERTION     PARTIAL MASTECTOMY WITH NEEDLE LOCALIZATION Right 04/01/2018   Procedure: PARTIAL MASTECTOMY WITH NEEDLE LOCALIZATION;  Surgeon: Herbert Pun, MD;  Location: [facility];  Service: General;  Laterality: Right;   PERIPHERAL VASCULAR CATHETERIZATION N/A 08/06/2016   Procedure: IVC Filter Insertion;  Surgeon: Katha Cabal, MD;  Location: [facility];  Service: Cardiovascular;  Laterality: N/A;   PERIPHERAL VASCULAR CATHETERIZATION Left 08/06/2016   Procedure: Lower Extremity Venography;  Surgeon: Katha Cabal, MD;  Location: [facility];  Service: Cardiovascular;  Laterality: Left;   PERIPHERAL VASCULAR CATHETERIZATION  08/06/2016   Procedure: Lower Extremity Intervention;  Surgeon: Katha Cabal, MD;  Location: [facility];  Service: Cardiovascular;;   PERIPHERAL VASCULAR CATHETERIZATION N/A 12/16/2016   Procedure: IVC Filter Removal;  Surgeon: Katha Cabal, MD;  Location: [facility];  Service: Cardiovascular;  Laterality: N/A;   SENTINEL NODE BIOPSY Right 04/01/2018   Procedure: SENTINEL NODE BIOPSY;  Surgeon: Herbert Pun, MD;  Location: [facility];  Service: General;  Laterality: Right;   TONSILLECTOMY      Social History Social  History   Tobacco Use   Smoking status: Never   Smokeless tobacco: Never  Vaping Use   Vaping Use: Never used  Substance Use Topics   Alcohol use: No   Drug use: No    Family History Family History  Problem Relation Age of Onset   Breast cancer Neg Hx    Prostate cancer Neg Hx    Kidney cancer Neg Hx     Allergies  Allergen Reactions   Levaquin [Levofloxacin In D5w] Hives   Macrolides And Ketolides Other (See Comments)    WHELPS WHELPS WHELPS   Penicillins Itching    Can take amoxicillin  Has patient had a PCN reaction causing immediate rash, facial/tongue/throat swelling, SOB or lightheadedness with hypotension:No Has patient had a PCN reaction causing severe rash involving mucus membranes or skin necrosis:unsure Has patient had a PCN reaction that required hospitalization:No Has patient had a PCN reaction occurring within the last 10 years: No If all of the above answers are "NO", then may proceed with Cephalosporin use.   Sulfa Antibiotics Hives     REVIEW OF SYSTEMS (Negative unless checked)  Constitutional: [] Weight loss  [] Fever  [] Chills Cardiac: [] Chest pain   [] Chest pressure   [] Palpitations   [] Shortness of breath when laying flat   [] Shortness of breath with exertion. Vascular:  [] Pain in legs with walking   [] Pain in legs at rest  [] History of DVT   [] Phlebitis   [] Swelling in legs   [] Varicose veins   [] Non-healing ulcers Pulmonary:   [] Uses home oxygen   [] Productive cough   [] Hemoptysis   [] Wheeze  [] COPD   [] Asthma Neurologic:  [] Dizziness   [] Seizures   [] History of stroke   [] History of TIA  [] Aphasia   [] Vissual changes   [] Weakness or numbness in arm   [] Weakness or numbness in leg Musculoskeletal:   [] Joint swelling   [] Joint pain   [] Low back pain Hematologic:  [] Easy bruising  [] Easy bleeding   [] Hypercoagulable state   [] Anemic Gastrointestinal:  [] Diarrhea   [] Vomiting  [] Gastroesophageal reflux/heartburn   [] Difficulty  swallowing. Genitourinary:  [] Chronic kidney disease   [] Difficult urination  [] Frequent urination   [] Blood in urine Skin:  [] Rashes   [] Ulcers  Psychological:  [] History of anxiety   []  History of major depression.  Physical Examination  There were no vitals filed for this visit. There is no height or weight on file to calculate BMI. Gen: WD/WN, NAD Head: [place]/AT, No temporalis wasting.  Ear/Nose/Throat: Hearing grossly intact, nares w/o erythema or drainage Eyes: PER, EOMI, sclera nonicteric.  Neck: Supple, no masses.  No bruit or JVD.  Pulmonary:  Good air movement, no audible wheezing, no use of accessory muscles.  Cardiac: RRR, normal S1, S2, no Murmurs. Vascular:  scattered varicosities present bilaterally.  Mild venous stasis changes to the legs bilaterally.  1+ soft pitting edema  Vessel Right Left  Radial Palpable Palpable  Gastrointestinal: soft, non-distended. No guarding/no  peritoneal signs.  Musculoskeletal: M/S 5/5 throughout.  No visible deformity.  Neurologic: CN 2-12 intact. Pain and light touch intact in extremities.  Symmetrical.  Speech is fluent. Motor exam as listed above. Psychiatric: Judgment intact, Mood & affect appropriate for pt's clinical situation. Dermatologic: No rashes or ulcers noted.  No changes consistent with cellulitis.   CBC Lab Results  Component Value Date   WBC 5.5 06/15/2018   HGB 14.5 06/15/2018   HCT 42.7 06/15/2018   MCV 93.7 06/15/2018   PLT 168 06/15/2018    BMET    Component Value Date/Time   NA 143 11/23/2015 2103   K 3.4 (L) 11/23/2015 2103   CL 105 11/23/2015 2103   CO2 28 11/23/2015 2103   GLUCOSE 101 (H) 11/23/2015 2103   BUN 23 (H) 08/06/2016 1426   CREATININE 0.88 08/06/2016 1426   CALCIUM 9.7 11/23/2015 2103   GFRNONAA >60 08/06/2016 1426   GFRAA >60 08/06/2016 1426   CrCl cannot be calculated (Patient's most recent lab result is older than the maximum 21 days allowed.).  COAG Lab Results  Component Value  Date   INR 1.33 08/06/2016    Radiology No results found.   Assessment/Plan 1. Chronic deep vein thrombosis (DVT) of femoral vein of both lower extremities (HCC) Recommend:    No surgery or intervention at this point in time.  IVC filter is not able to be removed and will therefore remain.   Patient's duplex ultrasound of the IVC shows normal flow pattern.   The patient will continue on anticoagulation    Elevation was stressed, use of a recliner was discussed.   I have had a long discussion with the patient regarding DVT and post phlebitic changes such as swelling and why it  causes symptoms such as pain.  The patient will wear graduated compression stockings class 1 (20-30 mmHg), beginning after three full days of anticoagulation, on a daily basis a prescription was given. The patient will  beginning wearing the stockings first thing in the morning and removing them in the evening. The patient is instructed specifically not to sleep in the stockings.  In addition, behavioral modification including elevation during the day and avoidance of prolonged dependency will be initiated.     The patient will continue anticoagulation for now as there have not been any problems or complications at this point.  - VAS US AORTA/IVC/ILIACS; Future  2. Chronic venous insufficiency Recommend:    No surgery or intervention at this point in time.  IVC filter is not able to be removed and will therefore remain.   Patient's duplex ultrasound of the IVC shows normal flow pattern.   The patient will continue on anticoagulation    Elevation was stressed, use of a recliner was discussed.   I have had a long discussion with the patient regarding DVT and post phlebitic changes such as swelling and why it  causes symptoms such as pain.  The patient will wear graduated compression stockings class 1 (20-30 mmHg), beginning after three full days of anticoagulation, on a daily basis a prescription was given.  The patient will  beginning wearing the stockings first thing in the morning and removing them in the evening. The patient is instructed specifically not to sleep in the stockings.  In addition, behavioral modification including elevation during the day and avoidance of prolonged dependency will be initiated.     The patient will continue anticoagulation for now as there have not been any problems or complications  at this point.   3. Lymphedema  No surgery or intervention at this point in time.     I have reviewed my previous discussion with the patient regarding swelling and why it  causes symptoms.  The patient is doing well with compression and will continue wearing graduated compression stockings class 1 (20-30 mmHg) on a daily basis a prescription was given. The patient will  continue wearing the stockings first thing in the morning and removing them in the evening. The patient is instructed specifically not to sleep in the stockings.     In addition, behavioral modification including elevation during the day and exercise will be continued.     Patient should follow-up on an annual basis   4. HTN, goal below 140/80 Continue antihypertensive medications as already ordered, these medications have been reviewed and there are no changes at this time.   5. Hypercholesteremia Continue statin as ordered and reviewed, no changes at this time     Hortencia Pilar, MD  01/20/2022 8:25 AM

## 2022-01-28 DIAGNOSIS — H5203 Hypermetropia, bilateral: Secondary | ICD-10-CM | POA: Diagnosis not present

## 2022-01-28 DIAGNOSIS — H52223 Regular astigmatism, bilateral: Secondary | ICD-10-CM | POA: Diagnosis not present

## 2022-01-28 DIAGNOSIS — H04123 Dry eye syndrome of bilateral lacrimal glands: Secondary | ICD-10-CM | POA: Diagnosis not present

## 2022-01-28 DIAGNOSIS — H524 Presbyopia: Secondary | ICD-10-CM | POA: Diagnosis not present

## 2022-01-28 DIAGNOSIS — H2513 Age-related nuclear cataract, bilateral: Secondary | ICD-10-CM | POA: Diagnosis not present

## 2022-01-28 DIAGNOSIS — H11003 Unspecified pterygium of eye, bilateral: Secondary | ICD-10-CM | POA: Diagnosis not present

## 2022-01-28 DIAGNOSIS — D3131 Benign neoplasm of right choroid: Secondary | ICD-10-CM | POA: Diagnosis not present

## 2022-02-21 ENCOUNTER — Encounter: Payer: PPO | Attending: Physician Assistant | Admitting: Physician Assistant

## 2022-02-21 DIAGNOSIS — L98492 Non-pressure chronic ulcer of skin of other sites with fat layer exposed: Secondary | ICD-10-CM | POA: Insufficient documentation

## 2022-02-21 DIAGNOSIS — I872 Venous insufficiency (chronic) (peripheral): Secondary | ICD-10-CM | POA: Diagnosis not present

## 2022-02-21 DIAGNOSIS — I10 Essential (primary) hypertension: Secondary | ICD-10-CM | POA: Insufficient documentation

## 2022-02-21 DIAGNOSIS — Z86718 Personal history of other venous thrombosis and embolism: Secondary | ICD-10-CM | POA: Insufficient documentation

## 2022-02-21 NOTE — Progress Notes (Signed)
[name] ([id number]) ?Visit Report for 02/21/2022 ?Allergy List Details ?Patient Name: Susan Myers, [name] ?Date of Service: 02/21/2022 8:45 AM ?Medical Record Number: [id number] ?Patient Account Number: [id number] ?Date of Birth/Sex: 1951-09-16 (71 y.o. Female) ?Treating RN: Carlene Coria ?Primary Care Haiden Clucas: Fulton Reek Other Clinician: ?Referring Chandra Feger: Referral, Self ?Treating Keshan Reha/Extender: Jeri Cos ?Weeks in Treatment: 0 ?Allergies ?Active Allergies ?Levaquin ?Sulfa (Sulfonamide Antibiotics) ?Macrolide Antibiotics ?Allergy Notes ?Electronic Signature(s) ?Signed: 02/21/2022 4:01:15 PM By: Carlene Coria RN ?Entered ByCarlene Coria on 02/21/2022 08:49:57 ?[name] ([id number]) ?-------------------------------------------------------------------------------- ?Arrival Information Details ?Patient Name: Susan Myers, [name] ?Date of Service: 02/21/2022 8:45 AM ?Medical Record Number: [id number] ?Patient Account Number: [id number] ?Date of Birth/Sex: Nov 16, 1951 (71 y.o. Female) ?Treating RN: Carlene Coria ?Primary Care Lennyn Gange: Fulton Reek Other Clinician: ?Referring Leani Myron: Referral, Self ?Treating Marcello Tuzzolino/Extender: Jeri Cos ?Weeks in Treatment: 0 ?Visit Information ?Patient Arrived: Ambulatory ?Arrival Time: 08:40 ?Accompanied By: husband ?Transfer Assistance: None ?Patient Identification Verified: Yes ?Secondary Verification Process Completed: Yes ?Patient Requires Transmission-Based No ?Precautions: ?Patient Has Alerts: Yes ?Patient Alerts: Patient on Blood ?Thinner ?Electronic Signature(s) ?Signed: 02/21/2022 4:01:15 PM By: Carlene Coria RN ?Entered By: Carlene Coria on 02/21/2022 08:45:56 ?[name] ([id number]) ?-------------------------------------------------------------------------------- ?Clinic Level of Care Assessment Details ?Patient Name: Susan Myers, [name] ?Date of Service: 02/21/2022 8:45 AM ?Medical Record Number: [id number] ?Patient Account Number:  [id number] ?Date of Birth/Sex: [birth year] (71 y.o. Female) ?Treating RN: Carlene Coria ?Primary Care Nasia Cannan: Fulton Reek Other Clinician: ?Referring Jamesa Tedrick: Referral, Self ?Treating Kincade Granberg/Extender: Jeri Cos ?Weeks in Treatment: 0 ?Clinic Level of Care Assessment Items ?TOOL 4 Quantity Score ?X - Use when only an EandM is performed on FOLLOW-UP visit 1 0 ?ASSESSMENTS - Nursing Assessment / Reassessment ?X - Reassessment of Co-morbidities (includes updates in patient status) 1 10 ?X- 1 5 ?Reassessment of Adherence to Treatment Plan ?ASSESSMENTS - Wound and Skin Assessment / Reassessment ?X - Simple Wound Assessment / Reassessment - one wound 1 5 ?'[]'$  - 0 ?Complex Wound Assessment / Reassessment - multiple wounds ?'[]'$  - 0 ?Dermatologic / Skin Assessment (not related to wound area) ?ASSESSMENTS - Focused Assessment ?'[]'$  - Circumferential Edema Measurements - multi extremities 0 ?'[]'$  - 0 ?Nutritional Assessment / Counseling / Intervention ?'[]'$  - 0 ?Lower Extremity Assessment (monofilament, tuning fork, pulses) ?'[]'$  - 0 ?Peripheral Arterial Disease Assessment (using hand held doppler) ?ASSESSMENTS - Ostomy and/or Continence Assessment and Care ?'[]'$  - Incontinence Assessment and Management 0 ?'[]'$  - 0 ?Ostomy Care Assessment and Management (repouching, etc.) ?PROCESS - Coordination of Care ?X - Simple Patient / Family Education for ongoing care 1 15 ?'[]'$  - 0 ?Complex (extensive) Patient / Family Education for ongoing care ?'[]'$  - 0 ?Staff obtains Consents, Records, Test Results / Process Orders ?'[]'$  - 0 ?Staff telephones HHA, Nursing Homes / Clarify orders / etc ?'[]'$  - 0 ?Routine Transfer to another Facility (non-emergent condition) ?'[]'$  - 0 ?Routine Hospital Admission (non-emergent condition) ?'[]'$  - 0 ?New Admissions / Biomedical engineer / Ordering NPWT, Apligraf, etc. ?'[]'$  - 0 ?Emergency Hospital Admission (emergent condition) ?X- 1 10 ?Simple Discharge Coordination ?'[]'$  - 0 ?Complex (extensive) Discharge  Coordination ?PROCESS - Special Needs ?'[]'$  - Pediatric / Minor Patient Management 0 ?'[]'$  - 0 ?Isolation Patient Management ?'[]'$  - 0 ?Hearing / Language / Visual special needs ?'[]'$  - 0 ?Assessment of Community assistance (transportation, D/C planning, etc.) ?'[]'$  - 0 ?Additional assistance / Altered mentation ?'[]'$  - 0 ?Support Surface(s) Assessment (bed, cushion, seat, etc.) ?INTERVENTIONS - Wound Cleansing / Measurement ?[name] ([id number]) ?X- 1 5 ?  Simple Wound Cleansing - one wound ?'[]'$  - 0 ?Complex Wound Cleansing - multiple wounds ?X- 1 5 ?Wound Imaging (photographs - any number of wounds) ?'[]'$  - 0 ?Wound Tracing (instead of photographs) ?X- 1 5 ?Simple Wound Measurement - one wound ?'[]'$  - 0 ?Complex Wound Measurement - multiple wounds ?INTERVENTIONS - Wound Dressings ?X - Small Wound Dressing one or multiple wounds 1 10 ?'[]'$  - 0 ?Medium Wound Dressing one or multiple wounds ?'[]'$  - 0 ?Large Wound Dressing one or multiple wounds ?'[]'$  - 0 ?Application of Medications - topical ?'[]'$  - 0 ?Application of Medications - injection ?INTERVENTIONS - Miscellaneous ?'[]'$  - External ear exam 0 ?'[]'$  - 0 ?Specimen Collection (cultures, biopsies, blood, body fluids, etc.) ?'[]'$  - 0 ?Specimen(s) / Culture(s) sent or taken to Lab for analysis ?'[]'$  - 0 ?Patient Transfer (multiple staff / Civil Service fast streamer / Similar devices) ?'[]'$  - 0 ?Simple Staple / Suture removal (25 or less) ?'[]'$  - 0 ?Complex Staple / Suture removal (26 or more) ?'[]'$  - 0 ?Hypo / Hyperglycemic Management (close monitor of Blood Glucose) ?'[]'$  - 0 ?Ankle / Brachial Index (ABI) - do not check if billed separately ?X- 1 5 ?Vital Signs ?Has the patient been seen at the hospital within the last three years: Yes ?Total Score: 75 ?Level Of Care: New/Established - Level ?2 ?Electronic Signature(s) ?Signed: 02/21/2022 4:01:15 PM By: Carlene Coria RN ?Entered ByCarlene Coria on 02/21/2022 09:36:29 ?[name]  ([id number]) ?-------------------------------------------------------------------------------- ?Encounter Discharge Information Details ?Patient Name: Susan Myers, [name] ?Date of Service: 02/21/2022 8:45 AM ?Medical Record Number: [id number] ?Patient Account Number: [id number] ?Date of Birth/Sex: 06-03-1951 (71 y.o. Female) ?Treating RN: Carlene Coria ?Primary Care Joangel Vanosdol: Fulton Reek Other Clinician: ?Referring Ascher Schroepfer: Referral, Self ?Treating Rebekah Sprinkle/Extender: Jeri Cos ?Weeks in Treatment: 0 ?Encounter Discharge Information Items ?Discharge Condition: Stable ?Ambulatory Status: Ambulatory ?Discharge Destination: Home ?Transportation: Private Auto ?Accompanied By: husband ?Schedule Follow-up Appointment: Yes ?Clinical Summary of Care: Patient Declined ?Electronic Signature(s) ?Signed: 02/21/2022 4:01:15 PM By: Carlene Coria RN ?Entered By: Carlene Coria on 02/21/2022 09:42:41 ?[name] ([id number]) ?-------------------------------------------------------------------------------- ?Lower Extremity Assessment Details ?Patient Name: Susan Myers, [name] ?Date of Service: 02/21/2022 8:45 AM ?Medical Record Number: [id number] ?Patient Account Number: [id number] ?Date of Birth/Sex: 10/18/1951 (71 y.o. Female) ?Treating RN: Carlene Coria ?Primary Care Shanielle Correll: Fulton Reek Other Clinician: ?Referring Oran Dillenburg: Referral, Self ?Treating Germani Gavilanes/Extender: Jeri Cos ?Weeks in Treatment: 0 ?Electronic Signature(s) ?Signed: 02/21/2022 4:01:15 PM By: Carlene Coria RN ?Entered By: Carlene Coria on 02/21/2022 08:55:11 ?[name] ([id number]) ?-------------------------------------------------------------------------------- ?Multi Wound Chart Details ?Patient Name: Susan Myers, [name] ?Date of Service: 02/21/2022 8:45 AM ?Medical Record Number: [id number] ?Patient Account Number: [id number] ?Date of Birth/Sex: 1951/07/29 (71 y.o. Female) ?Treating RN: Carlene Coria ?Primary Care Mande Auvil: Fulton Reek Other  Clinician: ?Referring Angell Pincock: Referral, Self ?Treating Adalyn Pennock/Extender: Jeri Cos ?Weeks in Treatment: 0 ?Vital Signs ?Height(in): 62 ?Pulse(bpm): 96 ?Weight(lbs): 200 ?Blood Pressure(mmHg): 165/95 ?Body Mass Index(BMI): 36.6 ?Temperature(??F): 97.8 ?Respiratory Rate(breaths/min): 18

## 2022-02-21 NOTE — Progress Notes (Addendum)
[name] ([id number]) ?Visit Report for 02/21/2022 ?Chief Complaint Document Details ?Patient Name: Susan Myers, [name] ?Date of Service: 02/21/2022 8:45 AM ?Medical Record Number: [id number] ?Patient Account Number: [id number] ?Date of Birth/Sex: Oct 19, 1951 (71 y.o. Female) ?Treating RN: Carlene Coria ?Primary Care Provider: Fulton Reek Other Clinician: ?Referring Provider: Referral, Self ?Treating Provider/Extender: Jeri Cos ?Weeks in Treatment: 0 ?Information Obtained from: Patient ?Chief Complaint ?Scalp Actinic Keratosis ?Electronic Signature(s) ?Signed: 02/21/2022 9:14:27 AM By: Worthy Keeler PA-C ?Entered By: Worthy Keeler on 02/21/2022 09:14:27 ?[name] ([id number]) ?-------------------------------------------------------------------------------- ?Debridement Details ?Patient Name: Susan Myers, [name] ?Date of Service: 02/21/2022 8:45 AM ?Medical Record Number: [id number] ?Patient Account Number: [id number] ?Date of Birth/Sex: [birth year] (71 y.o. Female) ?Treating RN: Carlene Coria ?Primary Care Provider: Fulton Reek Other Clinician: ?Referring Provider: Referral, Self ?Treating Provider/Extender: Jeri Cos ?Weeks in Treatment: 0 ?Debridement Performed for ?Wound #1 Head - Parietal ?Assessment: ?Performed By: Physician Tommie Sams., PA-C ?Debridement Type: Chemical/Enzymatic/Mechanical ?Agent Used: saline gauze ?Level of Consciousness (Pre- ?Awake and Alert ?procedure): ?Pre-procedure Verification/Time Out ?Yes - 09:15 ?Taken: ?Start Time: 09:15 ?Instrument: ?Other : saline gauze ?Bleeding: Minimum ?Hemostasis Achieved: Pressure ?End Time: 09:21 ?Procedural Pain: 0 ?Post Procedural Pain: 0 ?Response to Treatment: Procedure was tolerated well ?Level of Consciousness (Post- ?Awake and Alert ?procedure): ?Post Debridement Measurements of Total Wound ?Length: (cm) 4.5 ?Width: (cm) 3 ?Depth: (cm) 0.3 ?Volume: (cm?) 3.181 ?Character of Wound/Ulcer Post Debridement: Improved ?Post  Procedure Diagnosis ?Same as Pre-procedure ?Electronic Signature(s) ?Signed: 02/26/2022 3:40:11 PM By: Carlene Coria RN ?Signed: 02/28/2022 5:15:59 PM By: Worthy Keeler PA-C ?Entered By: Carlene Coria on 02/26/2022 15:40:11 ?[name] ([id number]) ?-------------------------------------------------------------------------------- ?HPI Details ?Patient Name: Susan Myers, [name] ?Date of Service: 02/21/2022 8:45 AM ?Medical Record Number: [id number] ?Patient Account Number: [id number] ?Date of Birth/Sex: July 03, 1951 (71 y.o. Female) ?Treating RN: Carlene Coria ?Primary Care Provider: Fulton Reek Other Clinician: ?Referring Provider: Referral, Self ?Treating Provider/Extender: Jeri Cos ?Weeks in Treatment: 0 ?History of Present Illness ?HPI Description: 02/21/2022 upon evaluation today patient presents for initial evaluation here in the clinic concerning an issue she has been ?having with her scalp. This is a biopsy confirmed actinic keratosis which has been managed by Dr. Nehemiah Massed for the past 2 years. The patient tells ?me currently that he has been performing debridement to clear away some of the scab when it would form. Subsequently he would then put some ?ointment on it typically mupirocin is what it sounds like was the case according to the patient and subsequently that would seem to do better for ?time. However it is never really healed up and continues to be an issue which over the past 2 years has caused her significant problems. She is ?notably frustrated during the office visit today not necessarily with me but just with the process altogether which I can completely understand. ?She does have a history of hypertension, chronic venous insufficiency, and delusional disorders but otherwise no major medical problems. ?Subsequently she has not seen any other specialist at [facility] or otherwise as far as dermatology is concerned she has not had any procedures such ?as laser, cryotherapy, etc.  Suspicions ?Electronic Signature(s) ?Signed: 02/21/2022 4:01:58 PM By: Worthy Keeler PA-C ?Entered By: Worthy Keeler on 02/21/2022 16:01:57 ?[name] ([id number]) ?-------------------------------------------------------------------------------- ?Physical Exam Details ?Patient Name: Susan Myers, [name] ?Date of Service: 02/21/2022 8:45 AM ?Medical Record Number: [id number] ?Patient Account Number: [id number] ?Date of Birth/Sex: 1951-09-25 (71 y.o. Female) ?Treating RN: Carlene Coria ?Primary Care Provider: Fulton Reek Other Clinician: ?Referring  Provider: Referral, Self ?Treating Provider/Extender: Jeri Cos ?Weeks in Treatment: 0 ?Constitutional ?patient is hypertensive.. pulse regular and within target range for patient.Marland Kitchen respirations regular, non-labored and within target range for patient.. ?temperature within target range for patient.. Well-nourished and well-hydrated in no acute distress. ?Eyes ?conjunctiva clear no eyelid edema noted. pupils equal round and reactive to light and accommodation. ?Ears, Nose, Mouth, and Throat ?no gross abnormality of ear auricles or external auditory canals. normal hearing noted during conversation. mucus membranes moist. ?Respiratory ?normal breathing without difficulty. ?Musculoskeletal ?normal gait and posture. no significant deformity or arthritic changes, no loss or range of motion, no clubbing. ?Psychiatric ?this patient is able to make decisions and demonstrates good insight into disease process. Alert and Oriented x 3. pleasant and cooperative. ?Notes ?Upon inspection patient did have drainage into a scab network on the surface of her scalp on the crown. Subsequently I was able to loosen up and ?get most of the soft there was a lot of hypergranular tissue underneath and fortunately I did not see any evidence of an issue here with regard to ?infection. Nonetheless I do believe that the patient is going require some type of dressing to try to help control  the moisture as I believe that is ?the big reason she has so much hypergranulation. The patient voiced understanding. Nonetheless she is still extremely frustrated. She also tells ?me that she is going on a cruise in 4 weeks. Obviously this is something that she would prefer not to be dealing with at that time but again there ?is not a guarantee that I can get this better in 4 weeks time. We can probably get it better than what it is now but not completely done although ?again you never know. ?Electronic Signature(s) ?Signed: 02/21/2022 4:03:32 PM By: Worthy Keeler PA-C ?Entered By: Worthy Keeler on 02/21/2022 16:03:32 ?[name] ([id number]) ?-------------------------------------------------------------------------------- ?Physician Orders Details ?Patient Name: Susan Myers, [name] ?Date of Service: 02/21/2022 8:45 AM ?Medical Record Number: [id number] ?Patient Account Number: [id number] ?Date of Birth/Sex: 30-Jul-1951 (71 y.o. Female) ?Treating RN: Carlene Coria ?Primary Care Provider: Fulton Reek Other Clinician: ?Referring Provider: Referral, Self ?Treating Provider/Extender: Jeri Cos ?Weeks in Treatment: 0 ?Verbal / Phone Orders: No ?Diagnosis Coding ?ICD-10 Coding ?Code Description ?L57.0 Actinic keratosis ?L98.492 Non-pressure chronic ulcer of skin of other sites with fat layer exposed ?I10 Essential (primary) hypertension ?I87.2 Venous insufficiency (chronic) (peripheral) ?F22 Delusional disorders ?Follow-up Appointments ?o Return Appointment in 1 week. ?Wound Treatment ?Wound #1 - Head - Parietal ?Cleanser: Byram Ancillary Kit - 15 Day Supply (DME) (Generic) 3 x Per Week/30 Days ?Discharge Instructions: Use supplies as instructed; Kit contains: (15) Saline Bullets; (15) 3x3 Gauze; 15 pr Gloves ?Cleanser: Soap and Water 3 x Per Week/30 Days ?Discharge Instructions: Gently cleanse wound with antibacterial soap, rinse and pat dry prior to dressing wounds ?Primary Dressing: Hydrofera Blue  Ready Transfer Foam, 2.5x2.5 (in/in) (DME) (Generic) 3 x Per Week/30 Days ?Discharge Instructions: Apply Hydrofera Blue Ready to wound bed as directed ?Secondary Dressing: (SILICONE BORDER) Zetuvit Plus SILICONE BORDER Dress

## 2022-02-21 NOTE — Progress Notes (Signed)
[name] ([id number]) ?Visit Report for 02/21/2022 ?Abuse Risk Screen Details ?Patient Name: Susan Myers, [name] ?Date of Service: 02/21/2022 8:45 AM ?Medical Record Number: [id number] ?Patient Account Number: [id number] ?Date of Birth/Sex: [birth year] (71 y.o. Female) ?Treating RN: Carlene Coria ?Primary Care Zakiah Gauthreaux: Fulton Reek Other Clinician: ?Referring Korver Graybeal: Referral, Self ?Treating Zahmir Lalla/Extender: Jeri Cos ?Weeks in Treatment: 0 ?Abuse Risk Screen Items ?Answer ?ABUSE RISK SCREEN: ?Has anyone close to you tried to hurt or harm you recentlyo No ?Do you feel uncomfortable with anyone in your familyo No ?Has anyone forced you do things that you didnot want to doo No ?Electronic Signature(s) ?Signed: 02/21/2022 4:01:15 PM By: Carlene Coria RN ?Entered By: Carlene Coria on 02/21/2022 08:53:03 ?[name] ([id number]) ?-------------------------------------------------------------------------------- ?Activities of Daily Living Details ?Patient Name: Susan Myers, [name] ?Date of Service: 02/21/2022 8:45 AM ?Medical Record Number: [id number] ?Patient Account Number: [id number] ?Date of Birth/Sex: January 16, 1951 (71 y.o. Female) ?Treating RN: Carlene Coria ?Primary Care Obed Samek: Fulton Reek Other Clinician: ?Referring Nakoma Gotwalt: Referral, Self ?Treating Norm Wray/Extender: Jeri Cos ?Weeks in Treatment: 0 ?Activities of Daily Living Items ?Answer ?Activities of Daily Living (Please select one for each item) ?Parc ?Take Medications Completely Able ?Use Telephone Completely Able ?Care for Appearance Completely Able ?Use Toilet Completely Able ?Bath / Shower Completely Able ?Dress Self Completely Able ?Feed Self Completely Able ?Walk Completely Able ?Get In / Out Bed Completely Able ?Housework Completely Able ?Prepare Meals Completely Able ?Handle Money Completely Able ?Shop for Self Completely Able ?Electronic Signature(s) ?Signed: 02/21/2022 4:01:15 PM By: Carlene Coria  RN ?Entered By: Carlene Coria on 02/21/2022 08:53:25 ?[name] ([id number]) ?-------------------------------------------------------------------------------- ?Education Screening Details ?Patient Name: Susan Myers, [name] ?Date of Service: 02/21/2022 8:45 AM ?Medical Record Number: [id number] ?Patient Account Number: [id number] ?Date of Birth/Sex: 23-Feb-1951 (71 y.o. Female) ?Treating RN: Carlene Coria ?Primary Care Artis Beggs: Fulton Reek Other Clinician: ?Referring Crecencio Kwiatek: Referral, Self ?Treating Mineola Duan/Extender: Jeri Cos ?Weeks in Treatment: 0 ?Primary Learner Assessed: Patient ?Learning Preferences/Education Level/Primary Language ?Learning Preference: Explanation ?Highest Education Level: High School ?Preferred Language: English ?Cognitive Barrier ?Language Barrier: No ?Translator Needed: No ?Memory Deficit: No ?Emotional Barrier: No ?Cultural/Religious Beliefs Affecting Medical Care: No ?Physical Barrier ?Impaired Vision: Yes Glasses ?Impaired Hearing: Yes Hearing Aid ?Decreased Hand dexterity: No ?Knowledge/Comprehension ?Knowledge Level: Medium ?Comprehension Level: High ?Ability to understand written instructions: High ?Ability to understand verbal instructions: High ?Motivation ?Anxiety Level: Anxious ?Cooperation: Cooperative ?Education Importance: Acknowledges Need ?Interest in Health Problems: Asks Questions ?Perception: Coherent ?Willingness to Engage in Self-Management ?High ?Activities: ?Readiness to Engage in Self-Management ?High ?Activities: ?Electronic Signature(s) ?Signed: 02/21/2022 4:01:15 PM By: Carlene Coria RN ?Entered ByCarlene Coria on 02/21/2022 08:54:28 ?[name] ([id number]) ?-------------------------------------------------------------------------------- ?Fall Risk Assessment Details ?Patient Name: Susan Myers, [name] ?Date of Service: 02/21/2022 8:45 AM ?Medical Record Number: [id number] ?Patient Account Number: [id number] ?Date of Birth/Sex: 04-25-1951 (72  y.o. Female) ?Treating RN: Carlene Coria ?Primary Care Maxamillian Tienda: Fulton Reek Other Clinician: ?Referring Laquonda Welby: Referral, Self ?Treating Joseff Luckman/Extender: Jeri Cos ?Weeks in Treatment: 0 ?Fall Risk Assessment Items ?Have you had 2 or more falls in the last 12 monthso 0 No ?Have you had any fall that resulted in injury in the last 12 monthso 0 No ?FALLS RISK SCREEN ?History of falling - immediate or within 3 months 0 No ?Secondary diagnosis (Do you have 2 or more medical diagnoseso) 0 No ?Ambulatory aid ?None/bed rest/wheelchair/nurse 0 No ?Crutches/cane/walker 0 No ?Furniture 0 No ?Intravenous therapy Access/Saline/Heparin Lock 0 No ?Gait/Transferring ?Normal/ bed rest/ wheelchair 0 No ?Weak (short  steps with or without shuffle, stooped but able to lift head while walking, may ?0 No ?seek support from furniture) ?Impaired (short steps with shuffle, may have difficulty arising from chair, head down, impaired ?0 No ?balance) ?Mental Status ?Oriented to own ability 0 No ?Electronic Signature(s) ?Signed: 02/21/2022 4:01:15 PM By: Carlene Coria RN ?Entered ByCarlene Coria on 02/21/2022 08:54:37 ?[name] ([id number]) ?-------------------------------------------------------------------------------- ?Foot Assessment Details ?Patient Name: Susan Myers, [name] ?Date of Service: 02/21/2022 8:45 AM ?Medical Record Number: [id number] ?Patient Account Number: [id number] ?Date of Birth/Sex: 04/06/1951 (71 y.o. Female) ?Treating RN: Carlene Coria ?Primary Care Viriginia Amendola: Fulton Reek Other Clinician: ?Referring Dario Yono: Referral, Self ?Treating Kassim Guertin/Extender: Jeri Cos ?Weeks in Treatment: 0 ?Foot Assessment Items ?Site Locations ?+ = Sensation present, - = Sensation absent, C = Callus, U = Ulcer ?R = Redness, W = Warmth, M = Maceration, PU = Pre-ulcerative lesion ?F = Fissure, S = Swelling, D = Dryness ?Assessment ?Right: Left: ?Other Deformity: No No ?Prior Foot Ulcer: No No ?Prior Amputation: No  No ?Charcot Joint: No No ?Ambulatory Status: Ambulatory Without Help ?Gait: Steady ?Electronic Signature(s) ?Signed: 02/21/2022 4:01:15 PM By: Carlene Coria RN ?Entered ByCarlene Coria on 02/21/2022 08:54:59 ?[name] ([id number]) ?-------------------------------------------------------------------------------- ?Nutrition Risk Screening Details ?Patient Name: Susan Myers, [name] ?Date of Service: 02/21/2022 8:45 AM ?Medical Record Number: [id number] ?Patient Account Number: [id number] ?Date of Birth/Sex: Apr 13, 1951 (71 y.o. Female) ?Treating RN: Carlene Coria ?Primary Care Cabe Lashley: Fulton Reek Other Clinician: ?Referring Ikaika Showers: Referral, Self ?Treating Imya Mance/Extender: Jeri Cos ?Weeks in Treatment: 0 ?Height (in): 62 ?Weight (lbs): 200 ?Body Mass Index (BMI): 36.6 ?Nutrition Risk Screening Items ?Score Screening ?NUTRITION RISK SCREEN: ?I have an illness or condition that made me change the kind and/or amount of food I eat 0 No ?I eat fewer than two meals per day 0 No ?I eat few fruits and vegetables, or milk products 0 No ?I have three or more drinks of beer, liquor or wine almost every day 0 No ?I have tooth or mouth problems that make it hard for me to eat 0 No ?I don't always have enough money to buy the food I need 0 No ?I eat alone most of the time 0 No ?I take three or more different prescribed or over-the-counter drugs a day 1 Yes ?Without wanting to, I have lost or gained 10 pounds in the last six months 0 No ?I am not always physically able to shop, cook and/or feed myself 0 No ?Nutrition Protocols ?Good Risk Protocol 0 No interventions needed ?Moderate Risk Protocol ?High Risk Proctocol ?Risk Level: Good Risk ?Score: 1 ?Electronic Signature(s) ?Signed: 02/21/2022 4:01:15 PM By: Carlene Coria RN ?Entered ByCarlene Coria on 02/21/2022 08:54:48 ?

## 2022-02-25 ENCOUNTER — Other Ambulatory Visit: Payer: Self-pay | Admitting: Oncology

## 2022-03-03 ENCOUNTER — Encounter: Payer: PPO | Attending: Physician Assistant | Admitting: Physician Assistant

## 2022-03-03 DIAGNOSIS — I10 Essential (primary) hypertension: Secondary | ICD-10-CM | POA: Insufficient documentation

## 2022-03-03 DIAGNOSIS — L570 Actinic keratosis: Secondary | ICD-10-CM | POA: Insufficient documentation

## 2022-03-03 DIAGNOSIS — I872 Venous insufficiency (chronic) (peripheral): Secondary | ICD-10-CM | POA: Insufficient documentation

## 2022-03-03 DIAGNOSIS — F22 Delusional disorders: Secondary | ICD-10-CM | POA: Insufficient documentation

## 2022-03-03 DIAGNOSIS — L98492 Non-pressure chronic ulcer of skin of other sites with fat layer exposed: Secondary | ICD-10-CM | POA: Insufficient documentation

## 2022-03-03 NOTE — Progress Notes (Addendum)
[name] ([id number]) ?Visit Report for 03/03/2022 ?Chief Complaint Document Details ?Patient Name: Susan Myers, [name] ?Date of Service: 03/03/2022 8:15 AM ?Medical Record Number: [id number] ?Patient Account Number: [id number] ?Date of Birth/Sex: [birth year] (71 y.o. F) ?Treating RN: Levora Dredge ?Primary Care Provider: Fulton Reek Other Clinician: ?Referring Provider: Fulton Reek ?Treating Provider/Extender: Jeri Cos ?Weeks in Treatment: 1 ?Information Obtained from: Patient ?Chief Complaint ?Scalp Actinic Keratosis ?Electronic Signature(s) ?Signed: 03/03/2022 8:33:51 AM By: Worthy Keeler PA-C ?Previous Signature: 03/03/2022 8:33:42 AM Version By: Worthy Keeler PA-C ?Entered By: Worthy Keeler on 03/03/2022 08:33:51 ?[name] ([id number]) ?-------------------------------------------------------------------------------- ?HPI Details ?Patient Name: Susan Myers, [name] ?Date of Service: 03/03/2022 8:15 AM ?Medical Record Number: [id number] ?Patient Account Number: [id number] ?Date of Birth/Sex: 1951/02/07 (71 y.o. F) ?Treating RN: Levora Dredge ?Primary Care Provider: Fulton Reek Other Clinician: ?Referring Provider: Fulton Reek ?Treating Provider/Extender: Jeri Cos ?Weeks in Treatment: 1 ?History of Present Illness ?HPI Description: 02/21/2022 upon evaluation today patient presents for initial evaluation here in the clinic concerning an issue she has been ?having with her scalp. This is a biopsy confirmed actinic keratosis which has been managed by Dr. Nehemiah Massed for the past 2 years. The patient tells ?me currently that he has been performing debridement to clear away some of the scab when it would form. Subsequently he would then put some ?ointment on it typically mupirocin is what it sounds like was the case according to the patient and subsequently that would seem to do better for ?time. However it is never really healed up and continues to be an issue which over the  past 2 years has caused her significant problems. She is ?notably frustrated during the office visit today not necessarily with me but just with the process altogether which I can completely understand. ?She does have a history of hypertension, chronic venous insufficiency, and delusional disorders but otherwise no major medical problems. ?Subsequently she has not seen any other specialist at [facility] or otherwise as far as dermatology is concerned she has not had any procedures such ?as laser, cryotherapy, etc. ?03-03-2022 upon evaluation today patient appears to be doing well with regard to her wound on the scalp. This is actually healing quite nicely. ?With that being said she continues to have significant issues here with the actinic keratosis and I am afraid that even if we get this healed it is ?going to be apt to reopen. I discussed that with her as well. Fortunately I do not see any signs of active infection locally nor systemically. ?Electronic Signature(s) ?Signed: 03/03/2022 10:06:39 AM By: Worthy Keeler PA-C ?Entered By: Worthy Keeler on 03/03/2022 10:06:39 ?[name] ([id number]) ?-------------------------------------------------------------------------------- ?Physical Exam Details ?Patient Name: Susan Myers, [name] ?Date of Service: 03/03/2022 8:15 AM ?Medical Record Number: [id number] ?Patient Account Number: [id number] ?Date of Birth/Sex: 1951/03/18 (71 y.o. F) ?Treating RN: Levora Dredge ?Primary Care Provider: Fulton Reek Other Clinician: ?Referring Provider: Fulton Reek ?Treating Provider/Extender: Jeri Cos ?Weeks in Treatment: 1 ?Constitutional ?Well-nourished and well-hydrated in no acute distress. ?Respiratory ?normal breathing without difficulty. ?Psychiatric ?this patient is able to make decisions and demonstrates good insight into disease process. Alert and Oriented x 3. pleasant and cooperative. ?Notes ?Upon inspection patient's wound bed showed signs again of good  epithelization and granulation I am actually much more pleased and she is not ?having the bleeding she was before everything about this seems to be doing much better. ?Electronic Signature(s) ?Signed: 03/03/2022 10:06:53 AM By: Worthy Keeler PA-C ?Entered By: Joaquim Lai  IIIMargarita Grizzle on 03/03/2022 10:06:52 ?[name] ([id number]) ?-------------------------------------------------------------------------------- ?Physician Orders Details ?Patient Name: Susan Myers, [name] ?Date of Service: 03/03/2022 8:15 AM ?Medical Record Number: [id number] ?Patient Account Number: [id number] ?Date of Birth/Sex: 1951-10-16 (71 y.o. F) ?Treating RN: Levora Dredge ?Primary Care Provider: Fulton Reek Other Clinician: ?Referring Provider: Fulton Reek ?Treating Provider/Extender: Jeri Cos ?Weeks in Treatment: 1 ?Verbal / Phone Orders: No ?Diagnosis Coding ?ICD-10 Coding ?Code Description ?L57.0 Actinic keratosis ?L98.492 Non-pressure chronic ulcer of skin of other sites with fat layer exposed ?I10 Essential (primary) hypertension ?I87.2 Venous insufficiency (chronic) (peripheral) ?F22 Delusional disorders ?Follow-up Appointments ?o Return Appointment in 1 week. ?Wound Treatment ?Wound #1 - Head - Parietal ?Cleanser: Byram Ancillary Kit - 15 Day Supply (Generic) 3 x Per Week/30 Days ?Discharge Instructions: Use supplies as instructed; Kit contains: (15) Saline Bullets; (15) 3x3 Gauze; 15 pr Gloves ?Cleanser: Soap and Water 3 x Per Week/30 Days ?Discharge Instructions: Gently cleanse wound with antibacterial soap, rinse and pat dry prior to dressing wounds ?Primary Dressing: Hydrofera Blue Ready Transfer Foam, 2.5x2.5 (in/in) (Generic) 3 x Per Week/30 Days ?Discharge Instructions: Apply Hydrofera Blue Ready to wound bed as directed ?Secondary Dressing: Gauze 3 x Per Week/30 Days ?Discharge Instructions: As directed: dry, moistened with saline or moistened with Dakins Solution ?Consults ?o Dermatology - Referral to [facility], patient to be seen for severe recurrent actinic keratosis on scalp, to discuss Narrow ?band UV-B light treatment. ?Electronic Signature(s) ?Signed: 03/03/2022 4:36:08 PM By: Levora Dredge ?Signed: 03/03/2022 4:48:03 PM By: Worthy Keeler PA-C ?Entered By: Levora Dredge on 03/03/2022 08:52:41 ?[name] ([id number]) ?-------------------------------------------------------------------------------- ?Problem List Details ?Patient Name: Susan Myers, [name] ?Date of Service: 03/03/2022 8:15 AM ?Medical Record Number: [id number] ?Patient Account Number: [id number] ?Date of Birth/Sex: 05/23/1951 (71 y.o. F) ?Treating RN: Levora Dredge ?Primary Care Provider: Fulton Reek Other Clinician: ?Referring Provider: Fulton Reek ?Treating Provider/Extender: Jeri Cos ?Weeks in Treatment: 1 ?Active Problems ?ICD-10 ?Encounter ?Code Description Active Date MDM ?Diagnosis ?L57.0 Actinic keratosis 02/21/2022 No Yes ?E16.244 Non-pressure chronic ulcer of skin of other sites with fat layer exposed 02/21/2022 No Yes ?I10 Essential (primary) hypertension 02/21/2022 No Yes ?I87.2 Venous insufficiency (chronic) (peripheral) 02/21/2022 No Yes ?F22 Delusional disorders 02/21/2022 No Yes ?Inactive Problems ?Resolved Problems ?Electronic Signature(s) ?Signed: 03/03/2022 8:33:38 AM By: Worthy Keeler PA-C ?Entered By: Worthy Keeler on 03/03/2022 08:33:38 ?[name] ([id number]) ?-------------------------------------------------------------------------------- ?Progress Note Details ?Patient Name: Susan Myers, [name] ?Date of Service: 03/03/2022 8:15 AM ?Medical Record Number: [id number] ?Patient Account Number: [id number] ?Date of Birth/Sex: 08-20-1951 (71 y.o. F) ?Treating RN: Levora Dredge ?Primary Care Provider: Fulton Reek Other Clinician: ?Referring Provider: Fulton Reek ?Treating Provider/Extender: Jeri Cos ?Weeks in Treatment: 1 ?Subjective ?Chief Complaint ?Information obtained  from Patient ?Scalp Actinic Keratosis ?History of Present Illness (HPI) ?02/21/2022 upon evaluation today patient presents for initial evaluation here in the clinic concerning an issue she has been having with her s

## 2022-03-03 NOTE — Progress Notes (Signed)
[name] ([id number]) ?Visit Report for 03/03/2022 ?Arrival Information Details ?Patient Name: Susan Myers, [name] ?Date of Service: 03/03/2022 8:15 AM ?Medical Record Number: [id number] ?Patient Account Number: [id number] ?Date of Birth/Sex: [birth year] (71 y.o. F) ?Treating RN: Levora Dredge ?Primary Care Cayley Pester: Fulton Reek Other Clinician: ?Referring Merdis Snodgrass: Fulton Reek ?Treating Joshwa Hemric/Extender: Jeri Cos ?Weeks in Treatment: 1 ?Visit Information History Since Last Visit ?Added or deleted any medications: No ?Patient Arrived: Ambulatory ?Any new allergies or adverse reactions: No ?Arrival Time: 08:05 ?Had a fall or experienced change in No ?Accompanied By: self ?activities of daily living that may affect ?Transfer Assistance: None ?risk of falls: ?Patient Requires Transmission-Based No ?Hospitalized since last visit: No ?Precautions: ?Has Dressing in Place as Prescribed: Yes ?Patient Has Alerts: Yes ?Pain Present Now: No ?Patient Alerts: Patient on Blood ?Thinner ?Electronic Signature(s) ?Signed: 03/03/2022 4:36:08 PM By: Levora Dredge ?Entered By: Levora Dredge on 03/03/2022 08:10:50 ?[name] ([id number]) ?-------------------------------------------------------------------------------- ?Clinic Level of Care Assessment Details ?Patient Name: Susan Myers, [name] ?Date of Service: 03/03/2022 8:15 AM ?Medical Record Number: [id number] ?Patient Account Number: [id number] ?Date of Birth/Sex: [birth year] (71 y.o. F) ?Treating RN: Levora Dredge ?Primary Care Daton Szilagyi: Fulton Reek Other Clinician: ?Referring Gabbriella Presswood: Fulton Reek ?Treating Sy Saintjean/Extender: Jeri Cos ?Weeks in Treatment: 1 ?Clinic Level of Care Assessment Items ?TOOL 4 Quantity Score ?'[]'$  - Use when only an EandM is performed on FOLLOW-UP visit 0 ?ASSESSMENTS - Nursing Assessment / Reassessment ?'[]'$  - Reassessment of Co-morbidities (includes updates in patient status) 0 ?X- 1 5 ?Reassessment of Adherence  to Treatment Plan ?ASSESSMENTS - Wound and Skin Assessment / Reassessment ?X - Simple Wound Assessment / Reassessment - one wound 1 5 ?'[]'$  - 0 ?Complex Wound Assessment / Reassessment - multiple wounds ?'[]'$  - 0 ?Dermatologic / Skin Assessment (not related to wound area) ?ASSESSMENTS - Focused Assessment ?'[]'$  - Circumferential Edema Measurements - multi extremities 0 ?'[]'$  - 0 ?Nutritional Assessment / Counseling / Intervention ?'[]'$  - 0 ?Lower Extremity Assessment (monofilament, tuning fork, pulses) ?'[]'$  - 0 ?Peripheral Arterial Disease Assessment (using hand held doppler) ?ASSESSMENTS - Ostomy and/or Continence Assessment and Care ?'[]'$  - Incontinence Assessment and Management 0 ?'[]'$  - 0 ?Ostomy Care Assessment and Management (repouching, etc.) ?PROCESS - Coordination of Care ?X - Simple Patient / Family Education for ongoing care 1 15 ?'[]'$  - 0 ?Complex (extensive) Patient / Family Education for ongoing care ?'[]'$  - 0 ?Staff obtains Consents, Records, Test Results / Process Orders ?'[]'$  - 0 ?Staff telephones HHA, Nursing Homes / Clarify orders / etc ?'[]'$  - 0 ?Routine Transfer to another Facility (non-emergent condition) ?'[]'$  - 0 ?Routine Hospital Admission (non-emergent condition) ?'[]'$  - 0 ?New Admissions / Biomedical engineer / Ordering NPWT, Apligraf, etc. ?'[]'$  - 0 ?Emergency Hospital Admission (emergent condition) ?X- 1 10 ?Simple Discharge Coordination ?'[]'$  - 0 ?Complex (extensive) Discharge Coordination ?PROCESS - Special Needs ?'[]'$  - Pediatric / Minor Patient Management 0 ?'[]'$  - 0 ?Isolation Patient Management ?'[]'$  - 0 ?Hearing / Language / Visual special needs ?'[]'$  - 0 ?Assessment of Community assistance (transportation, D/C planning, etc.) ?'[]'$  - 0 ?Additional assistance / Altered mentation ?'[]'$  - 0 ?Support Surface(s) Assessment (bed, cushion, seat, etc.) ?INTERVENTIONS - Wound Cleansing / Measurement ?[name] ([id number]) ?X- 1 5 ?Simple Wound Cleansing - one wound ?'[]'$  - 0 ?Complex Wound Cleansing - multiple  wounds ?X- 1 5 ?Wound Imaging (photographs - any number of wounds) ?'[]'$  - 0 ?Wound Tracing (instead of photographs) ?X- 1 5 ?Simple Wound Measurement - one wound ?'[]'$  - 0 ?  Complex Wound Measurement - multiple wounds ?INTERVENTIONS - Wound Dressings ?X - Small Wound Dressing one or multiple wounds 1 10 ?'[]'$  - 0 ?Medium Wound Dressing one or multiple wounds ?'[]'$  - 0 ?Large Wound Dressing one or multiple wounds ?'[]'$  - 0 ?Application of Medications - topical ?'[]'$  - 0 ?Application of Medications - injection ?INTERVENTIONS - Miscellaneous ?'[]'$  - External ear exam 0 ?'[]'$  - 0 ?Specimen Collection (cultures, biopsies, blood, body fluids, etc.) ?'[]'$  - 0 ?Specimen(s) / Culture(s) sent or taken to Lab for analysis ?'[]'$  - 0 ?Patient Transfer (multiple staff / Civil Service fast streamer / Similar devices) ?'[]'$  - 0 ?Simple Staple / Suture removal (25 or less) ?'[]'$  - 0 ?Complex Staple / Suture removal (26 or more) ?'[]'$  - 0 ?Hypo / Hyperglycemic Management (close monitor of Blood Glucose) ?'[]'$  - 0 ?Ankle / Brachial Index (ABI) - do not check if billed separately ?X- 1 5 ?Vital Signs ?Has the patient been seen at the hospital within the last three years: Yes ?Total Score: 65 ?Level Of Care: New/Established - Level ?2 ?Electronic Signature(s) ?Signed: 03/03/2022 4:36:08 PM By: Levora Dredge ?Entered By: Levora Dredge on 03/03/2022 08:51:47 ?[name] ([id number]) ?-------------------------------------------------------------------------------- ?Encounter Discharge Information Details ?Patient Name: Susan Myers, [name] ?Date of Service: 03/03/2022 8:15 AM ?Medical Record Number: [id number] ?Patient Account Number: [id number] ?Date of Birth/Sex: 18-Nov-1951 (71 y.o. F) ?Treating RN: Levora Dredge ?Primary Care Skarleth Delmonico: Fulton Reek Other Clinician: ?Referring Larita Deremer: Fulton Reek ?Treating Izaak Sahr/Extender: Jeri Cos ?Weeks in Treatment: 1 ?Encounter Discharge Information Items ?Discharge Condition: Stable ?Ambulatory Status:  Ambulatory ?Discharge Destination: Home ?Transportation: Private Auto ?Accompanied By: husband ?Schedule Follow-up Appointment: Yes ?Clinical Summary of Care: Patient Declined ?Electronic Signature(s) ?Signed: 03/03/2022 4:36:08 PM By: Levora Dredge ?Entered By: Levora Dredge on 03/03/2022 08:53:18 ?[name] ([id number]) ?-------------------------------------------------------------------------------- ?Lower Extremity Assessment Details ?Patient Name: Susan Myers, [name] ?Date of Service: 03/03/2022 8:15 AM ?Medical Record Number: [id number] ?Patient Account Number: [id number] ?Date of Birth/Sex: Jan 26, 1951 (71 y.o. F) ?Treating RN: Levora Dredge ?Primary Care Aden Youngman: Fulton Reek Other Clinician: ?Referring Brandon Wiechman: Fulton Reek ?Treating Hyun Marsalis/Extender: Jeri Cos ?Weeks in Treatment: 1 ?Electronic Signature(s) ?Signed: 03/03/2022 4:36:08 PM By: Levora Dredge ?Entered By: Levora Dredge on 03/03/2022 08:20:37 ?[name] ([id number]) ?-------------------------------------------------------------------------------- ?Multi Wound Chart Details ?Patient Name: Susan Myers, [name] ?Date of Service: 03/03/2022 8:15 AM ?Medical Record Number: [id number] ?Patient Account Number: [id number] ?Date of Birth/Sex: [birth year] (70 y.o. F) ?Treating RN: Levora Dredge ?Primary Care Ayahna Solazzo: Fulton Reek Other Clinician: ?Referring Hephzibah Strehle: Fulton Reek ?Treating Fiorela Pelzer/Extender: Jeri Cos ?Weeks in Treatment: 1 ?Vital Signs ?Height(in): 62 ?Pulse(bpm): 91 ?Weight(lbs): 200 ?Blood Pressure(mmHg): 139/87 ?Body Mass Index(BMI): 36.6 ?Temperature(??F): 97.6 ?Respiratory Rate(breaths/min): 18 ?Photos: [N/A:N/A] ?Wound Location: Head - Parietal N/A N/A ?Wounding Event: Gradually Appeared N/A N/A ?Primary Etiology: Atypical N/A N/A ?Comorbid History: Deep Vein Thrombosis, N/A N/A ?Hypertension, Vasculitis ?Date Acquired: 12/15/2018 N/A N/A ?Weeks of Treatment: 1 N/A N/A ?Wound Status: Open  N/A N/A ?Wound Recurrence: No N/A N/A ?Measurements L x W x D (cm) 3.2x0.8x0.2 N/A N/A ?Area (cm?) : 2.011 N/A N/A ?Volume (cm?) : 0.402 N/A N/A ?% Reduction in Area: 71.60% N/A N/A ?% Reduction in Volume: 81.00% N/A N/A ?

## 2022-03-04 DIAGNOSIS — S0190XA Unspecified open wound of unspecified part of head, initial encounter: Secondary | ICD-10-CM | POA: Diagnosis not present

## 2022-03-10 ENCOUNTER — Encounter: Payer: PPO | Admitting: Physician Assistant

## 2022-03-10 DIAGNOSIS — I10 Essential (primary) hypertension: Secondary | ICD-10-CM | POA: Diagnosis not present

## 2022-03-10 DIAGNOSIS — L570 Actinic keratosis: Secondary | ICD-10-CM | POA: Diagnosis not present

## 2022-03-10 DIAGNOSIS — I872 Venous insufficiency (chronic) (peripheral): Secondary | ICD-10-CM | POA: Diagnosis not present

## 2022-03-10 DIAGNOSIS — L98492 Non-pressure chronic ulcer of skin of other sites with fat layer exposed: Secondary | ICD-10-CM | POA: Diagnosis not present

## 2022-03-10 NOTE — Progress Notes (Addendum)
[name] ([id number]) ?Visit Report for 03/10/2022 ?Arrival Information Details ?Patient Name: Susan Myers, [name] ?Date of Service: 03/10/2022 10:30 AM ?Medical Record Number: [id number] ?Patient Account Number: [id number] ?Date of Birth/Sex: September 22, 1951 (71 y.o. F) ?Treating RN: Levora Dredge ?Primary Care Hien Cunliffe: Fulton Reek Other Clinician: ?Referring Philbert Ocallaghan: Fulton Reek ?Treating Abdalla Naramore/Extender: Jeri Cos ?Weeks in Treatment: 2 ?Visit Information History Since Last Visit ?Added or deleted any medications: No ?Patient Arrived: Ambulatory ?Any new allergies or adverse reactions: No ?Arrival Time: 10:28 ?Had a fall or experienced change in No ?Accompanied By: husband ?activities of daily living that may affect ?Transfer Assistance: None ?risk of falls: ?Patient Identification Verified: Yes ?Hospitalized since last visit: No ?Secondary Verification Process Completed: Yes ?Has Dressing in Place as Prescribed: Yes ?Patient Requires Transmission-Based No ?Has Compression in Place as Prescribed: Yes ?Precautions: ?Pain Present Now: No ?Patient Has Alerts: Yes ?Patient Alerts: Patient on Blood ?Thinner ?Electronic Signature(s) ?Signed: 03/10/2022 4:49:52 PM By: Levora Dredge ?Entered By: Levora Dredge on 03/10/2022 10:33:35 ?[name] ([id number]) ?-------------------------------------------------------------------------------- ?Clinic Level of Care Assessment Details ?Patient Name: Susan Myers, [name] ?Date of Service: 03/10/2022 10:30 AM ?Medical Record Number: [id number] ?Patient Account Number: [id number] ?Date of Birth/Sex: [birth year] (71 y.o. F) ?Treating RN: Levora Dredge ?Primary Care Merna Baldi: Fulton Reek Other Clinician: ?Referring Doron Shake: Fulton Reek ?Treating Nil Bolser/Extender: Jeri Cos ?Weeks in Treatment: 2 ?Clinic Level of Care Assessment Items ?TOOL 4 Quantity Score ?'[]'$  - Use when only an EandM is performed on FOLLOW-UP visit 0 ?ASSESSMENTS - Nursing  Assessment / Reassessment ?X - Reassessment of Co-morbidities (includes updates in patient status) 1 10 ?X- 1 5 ?Reassessment of Adherence to Treatment Plan ?ASSESSMENTS - Wound and Skin Assessment / Reassessment ?X - Simple Wound Assessment / Reassessment - one wound 1 5 ?'[]'$  - 0 ?Complex Wound Assessment / Reassessment - multiple wounds ?'[]'$  - 0 ?Dermatologic / Skin Assessment (not related to wound area) ?ASSESSMENTS - Focused Assessment ?'[]'$  - Circumferential Edema Measurements - multi extremities 0 ?'[]'$  - 0 ?Nutritional Assessment / Counseling / Intervention ?'[]'$  - 0 ?Lower Extremity Assessment (monofilament, tuning fork, pulses) ?'[]'$  - 0 ?Peripheral Arterial Disease Assessment (using hand held doppler) ?ASSESSMENTS - Ostomy and/or Continence Assessment and Care ?'[]'$  - Incontinence Assessment and Management 0 ?'[]'$  - 0 ?Ostomy Care Assessment and Management (repouching, etc.) ?PROCESS - Coordination of Care ?X - Simple Patient / Family Education for ongoing care 1 15 ?'[]'$  - 0 ?Complex (extensive) Patient / Family Education for ongoing care ?'[]'$  - 0 ?Staff obtains Consents, Records, Test Results / Process Orders ?'[]'$  - 0 ?Staff telephones HHA, Nursing Homes / Clarify orders / etc ?'[]'$  - 0 ?Routine Transfer to another Facility (non-emergent condition) ?'[]'$  - 0 ?Routine Hospital Admission (non-emergent condition) ?'[]'$  - 0 ?New Admissions / Biomedical engineer / Ordering NPWT, Apligraf, etc. ?'[]'$  - 0 ?Emergency Hospital Admission (emergent condition) ?X- 1 10 ?Simple Discharge Coordination ?'[]'$  - 0 ?Complex (extensive) Discharge Coordination ?PROCESS - Special Needs ?'[]'$  - Pediatric / Minor Patient Management 0 ?'[]'$  - 0 ?Isolation Patient Management ?'[]'$  - 0 ?Hearing / Language / Visual special needs ?'[]'$  - 0 ?Assessment of Community assistance (transportation, D/C planning, etc.) ?'[]'$  - 0 ?Additional assistance / Altered mentation ?'[]'$  - 0 ?Support Surface(s) Assessment (bed, cushion, seat, etc.) ?INTERVENTIONS - Wound Cleansing /  Measurement ?[name] ([id number]) ?X- 1 5 ?Simple Wound Cleansing - one wound ?'[]'$  - 0 ?Complex Wound Cleansing - multiple wounds ?X- 1 5 ?Wound Imaging (photographs - any number of wounds) ?'[]'$  - 0 ?  Wound Tracing (instead of photographs) ?X- 1 5 ?Simple Wound Measurement - one wound ?'[]'$  - 0 ?Complex Wound Measurement - multiple wounds ?INTERVENTIONS - Wound Dressings ?X - Small Wound Dressing one or multiple wounds 1 10 ?'[]'$  - 0 ?Medium Wound Dressing one or multiple wounds ?'[]'$  - 0 ?Large Wound Dressing one or multiple wounds ?'[]'$  - 0 ?Application of Medications - topical ?'[]'$  - 0 ?Application of Medications - injection ?INTERVENTIONS - Miscellaneous ?'[]'$  - External ear exam 0 ?'[]'$  - 0 ?Specimen Collection (cultures, biopsies, blood, body fluids, etc.) ?'[]'$  - 0 ?Specimen(s) / Culture(s) sent or taken to Lab for analysis ?'[]'$  - 0 ?Patient Transfer (multiple staff / Civil Service fast streamer / Similar devices) ?'[]'$  - 0 ?Simple Staple / Suture removal (25 or less) ?'[]'$  - 0 ?Complex Staple / Suture removal (26 or more) ?'[]'$  - 0 ?Hypo / Hyperglycemic Management (close monitor of Blood Glucose) ?'[]'$  - 0 ?Ankle / Brachial Index (ABI) - do not check if billed separately ?X- 1 5 ?Vital Signs ?Has the patient been seen at the hospital within the last three years: Yes ?Total Score: 75 ?Level Of Care: New/Established - Level ?2 ?Electronic Signature(s) ?Signed: 03/10/2022 4:49:52 PM By: Levora Dredge ?Entered By: Levora Dredge on 03/10/2022 11:12:28 ?[name] ([id number]) ?-------------------------------------------------------------------------------- ?Encounter Discharge Information Details ?Patient Name: Susan Myers, [name] ?Date of Service: 03/10/2022 10:30 AM ?Medical Record Number: [id number] ?Patient Account Number: [id number] ?Date of Birth/Sex: 10/12/1951 (71 y.o. F) ?Treating RN: Levora Dredge ?Primary Care Quientin Jent: Fulton Reek Other Clinician: ?Referring Lynetta Tomczak: Fulton Reek ?Treating Ardys Hataway/Extender:  Jeri Cos ?Weeks in Treatment: 2 ?Encounter Discharge Information Items ?Discharge Condition: Stable ?Ambulatory Status: Ambulatory ?Discharge Destination: Home ?Transportation: Private Auto ?Accompanied By: husband ?Schedule Follow-up Appointment: Yes ?Clinical Summary of Care: Patient Declined ?Electronic Signature(s) ?Signed: 03/10/2022 4:49:52 PM By: Levora Dredge ?Entered By: Levora Dredge on 03/10/2022 11:14:55 ?[name] ([id number]) ?-------------------------------------------------------------------------------- ?Lower Extremity Assessment Details ?Patient Name: Susan Myers, [name] ?Date of Service: 03/10/2022 10:30 AM ?Medical Record Number: [id number] ?Patient Account Number: [id number] ?Date of Birth/Sex: 1951-03-08 (71 y.o. F) ?Treating RN: Levora Dredge ?Primary Care Darthy Manganelli: Fulton Reek Other Clinician: ?Referring Myelle Poteat: Fulton Reek ?Treating Teffany Blaszczyk/Extender: Jeri Cos ?Weeks in Treatment: 2 ?Electronic Signature(s) ?Signed: 03/10/2022 4:49:52 PM By: Levora Dredge ?Entered By: Levora Dredge on 03/10/2022 10:44:22 ?[name] ([id number]) ?-------------------------------------------------------------------------------- ?Multi Wound Chart Details ?Patient Name: Susan Myers, [name] ?Date of Service: 03/10/2022 10:30 AM ?Medical Record Number: [id number] ?Patient Account Number: [id number] ?Date of Birth/Sex: Apr 11, 1951 (71 y.o. F) ?Treating RN: Levora Dredge ?Primary Care Tyyne Cliett: Fulton Reek Other Clinician: ?Referring Darivs Lunden: Fulton Reek ?Treating Laiklynn Raczynski/Extender: Jeri Cos ?Weeks in Treatment: 2 ?Vital Signs ?Height(in): 62 ?Pulse(bpm): 82 ?Weight(lbs): 200 ?Blood Pressure(mmHg): 132/85 ?Body Mass Index(BMI): 36.6 ?Temperature(??F): 97.9 ?Respiratory Rate(breaths/min): 18 ?Photos: [N/A:N/A] ?Wound Location: Head - Parietal N/A N/A ?Wounding Event: Gradually Appeared N/A N/A ?Primary Etiology: Atypical N/A N/A ?Comorbid History: Deep Vein  Thrombosis, N/A N/A ?Hypertension, Vasculitis ?Date Acquired: 12/15/2018 N/A N/A ?Weeks of Treatment: 2 N/A N/A ?Wound Status: Open N/A N/A ?Wound Recurrence: No N/A N/A ?Measurements L x W x D (cm) 3.4x1.4x0.1 N/A

## 2022-03-10 NOTE — Progress Notes (Addendum)
[name] ([id number]) ?Visit Report for 03/10/2022 ?Chief Complaint Document Details ?Patient Name: Susan Myers, [name] ?Date of Service: 03/10/2022 10:30 AM ?Medical Record Number: [id number] ?Patient Account Number: [id number] ?Date of Birth/Sex: February 06, 1951 (71 y.o. F) ?Treating RN: Levora Dredge ?Primary Care Provider: Fulton Reek Other Clinician: ?Referring Provider: Fulton Reek ?Treating Provider/Extender: Jeri Cos ?Weeks in Treatment: 2 ?Information Obtained from: Patient ?Chief Complaint ?Scalp Actinic Keratosis ?Electronic Signature(s) ?Signed: 03/10/2022 10:22:24 AM By: Worthy Keeler PA-C ?Entered By: Worthy Keeler on 03/10/2022 10:22:24 ?[name] ([id number]) ?-------------------------------------------------------------------------------- ?HPI Details ?Patient Name: Susan Myers, [name] ?Date of Service: 03/10/2022 10:30 AM ?Medical Record Number: [id number] ?Patient Account Number: [id number] ?Date of Birth/Sex: Mar 02, 1951 (71 y.o. F) ?Treating RN: Levora Dredge ?Primary Care Provider: Fulton Reek Other Clinician: ?Referring Provider: Fulton Reek ?Treating Provider/Extender: Jeri Cos ?Weeks in Treatment: 2 ?History of Present Illness ?HPI Description: 02/21/2022 upon evaluation today patient presents for initial evaluation here in the clinic concerning an issue she has been ?having with her scalp. This is a biopsy confirmed actinic keratosis which has been managed by Dr. Nehemiah Massed for the past 2 years. The patient tells ?me currently that he has been performing debridement to clear away some of the scab when it would form. Subsequently he would then put some ?ointment on it typically mupirocin is what it sounds like was the case according to the patient and subsequently that would seem to do better for ?time. However it is never really healed up and continues to be an issue which over the past 2 years has caused her significant problems. She is ?notably  frustrated during the office visit today not necessarily with me but just with the process altogether which I can completely understand. ?She does have a history of hypertension, chronic venous insufficiency, and delusional disorders but otherwise no major medical problems. ?Subsequently she has not seen any other specialist at [facility] or otherwise as far as dermatology is concerned she has not had any procedures such ?as laser, cryotherapy, etc. ?03-03-2022 upon evaluation today patient appears to be doing well with regard to her wound on the scalp. This is actually healing quite nicely. ?With that being said she continues to have significant issues here with the actinic keratosis and I am afraid that even if we get this healed it is ?going to be apt to reopen. I discussed that with her as well. Fortunately I do not see any signs of active infection locally nor systemically. ?03-10-2022 upon evaluation today patient appears to be doing well with regard to her scalp ulceration. Nonetheless this is an actinic keratosis type ?issue and subsequently she is still having significant issues here with being somewhat friable and bleeding although with the [facility] this is ?much better than anything we have seen previous. Fortunately I do not see any evidence of active infection locally or systemically which is great ?news. No fevers, chills, nausea, vomiting, or diarrhea. ?Electronic Signature(s) ?Signed: 03/10/2022 11:23:05 AM By: Worthy Keeler PA-C ?Entered By: Worthy Keeler on 03/10/2022 11:23:04 ?[name] ([id number]) ?-------------------------------------------------------------------------------- ?Physical Exam Details ?Patient Name: Susan Myers, [name] ?Date of Service: 03/10/2022 10:30 AM ?Medical Record Number: [id number] ?Patient Account Number: [id number] ?Date of Birth/Sex: [birth year] (71 y.o. F) ?Treating RN: Levora Dredge ?Primary Care Provider: Fulton Reek Other Clinician: ?Referring  Provider: Fulton Reek ?Treating Provider/Extender: Jeri Cos ?Weeks in Treatment: 2 ?Constitutional ?Well-nourished and well-hydrated in no acute distress. ?Respiratory ?normal breathing without difficulty. ?Psychiatric ?this patient is able to make decisions and  demonstrates good insight into disease process. Alert and Oriented x 3. pleasant and cooperative. ?Notes ?Patient's wound bed showed signs of some friability although the granulation was much better than what we previously noted. I do believe the ?Hydrofera Blue has done extremely well in that regard. ?Electronic Signature(s) ?Signed: 03/10/2022 11:23:48 AM By: Worthy Keeler PA-C ?Entered By: Worthy Keeler on 03/10/2022 11:23:48 ?[name] ([id number]) ?-------------------------------------------------------------------------------- ?Physician Orders Details ?Patient Name: Susan Myers, [name] ?Date of Service: 03/10/2022 10:30 AM ?Medical Record Number: [id number] ?Patient Account Number: [id number] ?Date of Birth/Sex: November 14, 1951 (71 y.o. F) ?Treating RN: Levora Dredge ?Primary Care Provider: Fulton Reek Other Clinician: ?Referring Provider: Fulton Reek ?Treating Provider/Extender: Jeri Cos ?Weeks in Treatment: 2 ?Verbal / Phone Orders: No ?Diagnosis Coding ?ICD-10 Coding ?Code Description ?L57.0 Actinic keratosis ?L98.492 Non-pressure chronic ulcer of skin of other sites with fat layer exposed ?I10 Essential (primary) hypertension ?I87.2 Venous insufficiency (chronic) (peripheral) ?F22 Delusional disorders ?Follow-up Appointments ?o Return Appointment in 1 week. ?Wound Treatment ?Wound #1 - Head - Parietal ?Cleanser: Byram Ancillary Kit - 15 Day Supply (Generic) 3 x Per Week/30 Days ?Discharge Instructions: Use supplies as instructed; Kit contains: (15) Saline Bullets; (15) 3x3 Gauze; 15 pr Gloves ?Cleanser: Soap and Water 3 x Per Week/30 Days ?Discharge Instructions: Gently cleanse wound with antibacterial soap, rinse and  pat dry prior to dressing wounds ?Topical: Triple Antibiotic Ointment, 1 (oz) Tube 3 x Per Week/30 Days ?Discharge Instructions: applied around edges of wound to prevent sticking ?Primary Dressing: Hydrofera Blue Ready Transfer Foam, 2.5x2.5 (in/in) (Generic) 3 x Per Week/30 Days ?Discharge Instructions: Apply Hydrofera Blue Ready to wound bed as directed ?Secondary Dressing: Gauze 3 x Per Week/30 Days ?Discharge Instructions: As directed: dry, moistened with saline or moistened with Dakins Solution ?Electronic Signature(s) ?Signed: 03/10/2022 4:40:44 PM By: Worthy Keeler PA-C ?Signed: 03/10/2022 4:49:52 PM By: Levora Dredge ?Entered By: Levora Dredge on 03/10/2022 11:20:58 ?[name] ([id number]) ?-------------------------------------------------------------------------------- ?Problem List Details ?Patient Name: Susan Myers, [name] ?Date of Service: 03/10/2022 10:30 AM ?Medical Record Number: [id number] ?Patient Account Number: [id number] ?Date of Birth/Sex: [birth year] (71 y.o. F) ?Treating RN: Levora Dredge ?Primary Care Provider: Fulton Reek Other Clinician: ?Referring Provider: Fulton Reek ?Treating Provider/Extender: Jeri Cos ?Weeks in Treatment: 2 ?Active Problems ?ICD-10 ?Encounter ?Code Description Active Date MDM ?Diagnosis ?L57.0 Actinic keratosis 02/21/2022 No Yes ?V87.215 Non-pressure chronic ulcer of skin of other sites with fat layer exposed 02/21/2022 No Yes ?I10 Essential (primary) hypertension 02/21/2022 No Yes ?I87.2 Venous insufficiency (chronic) (peripheral) 02/21/2022 No Yes ?F22 Delusional disorders 02/21/2022 No Yes ?Inactive Problems ?Resolved Problems ?Electronic Signature(s) ?Signed: 03/10/2022 10:22:19 AM By: Worthy Keeler PA-C ?Entered By: Worthy Keeler on 03/10/2022 10:22:19 ?[name] ([id number]) ?-------------------------------------------------------------------------------- ?Progress Note Details ?Patient Name: Susan Myers, [name] ?Date of Service:  03/10/2022 10:30 AM ?Medical Record Number: [id number] ?Patient Account Number: [id number] ?Date of Birth/Sex: 03-03-1951 (71 y.o. F) ?Treating RN: Levora Dredge ?Primary Care Provider: Fulton Reek Other [facility]

## 2022-03-17 ENCOUNTER — Encounter: Payer: PPO | Admitting: Physician Assistant

## 2022-03-17 DIAGNOSIS — L570 Actinic keratosis: Secondary | ICD-10-CM | POA: Diagnosis not present

## 2022-03-17 DIAGNOSIS — L98492 Non-pressure chronic ulcer of skin of other sites with fat layer exposed: Secondary | ICD-10-CM | POA: Diagnosis not present

## 2022-03-17 NOTE — Progress Notes (Addendum)
[name] ([id number]) ?Visit Report for 03/17/2022 ?Arrival Information Details ?Patient Name: Susan Myers, [name] ?Date of Service: 03/17/2022 10:30 AM ?Medical Record Number: [id number] ?Patient Account Number: [id number] ?Date of Birth/Sex: [birth year] (71 y.o. F) ?Treating RN: Levora Dredge ?Primary Care Lotta Frankenfield: Fulton Reek Other Clinician: ?Referring Dariana Garbett: Fulton Reek ?Treating Imaad Reuss/Extender: Jeri Cos ?Weeks in Treatment: 3 ?Visit Information History Since Last Visit ?Added or deleted any medications: No ?Patient Arrived: Ambulatory ?Any new allergies or adverse reactions: No ?Arrival Time: 10:46 ?Had a fall or experienced change in No ?Accompanied By: husband ?activities of daily living that may affect ?Transfer Assistance: None ?risk of falls: ?Patient Identification Verified: Yes ?Hospitalized since last visit: No ?Secondary Verification Process Completed: Yes ?Has Dressing in Place as Prescribed: Yes ?Patient Requires Transmission-Based No ?Pain Present Now: No ?Precautions: ?Patient Has Alerts: Yes ?Patient Alerts: Patient on Blood ?Thinner ?Electronic Signature(s) ?Signed: 03/17/2022 4:22:54 PM By: Levora Dredge ?Entered By: Levora Dredge on 03/17/2022 10:48:09 ?[name] ([id number]) ?-------------------------------------------------------------------------------- ?Clinic Level of Care Assessment Details ?Patient Name: Susan Myers, [name] ?Date of Service: 03/17/2022 10:30 AM ?Medical Record Number: [id number] ?Patient Account Number: [id number] ?Date of Birth/Sex: 01-Jan-1951 (71 y.o. F) ?Treating RN: Levora Dredge ?Primary Care Lavonia Eager: Fulton Reek Other Clinician: ?Referring Aksel Bencomo: Fulton Reek ?Treating Seng Fouts/Extender: Jeri Cos ?Weeks in Treatment: 3 ?Clinic Level of Care Assessment Items ?TOOL 4 Quantity Score ?'[]'$  - Use when only an EandM is performed on FOLLOW-UP visit 0 ?ASSESSMENTS - Nursing Assessment / Reassessment ?'[]'$  - Reassessment of  Co-morbidities (includes updates in patient status) 0 ?'[]'$  - 0 ?Reassessment of Adherence to Treatment Plan ?ASSESSMENTS - Wound and Skin Assessment / Reassessment ?'[]'$  - Simple Wound Assessment / Reassessment - one wound 0 ?'[]'$  - 0 ?Complex Wound Assessment / Reassessment - multiple wounds ?'[]'$  - 0 ?Dermatologic / Skin Assessment (not related to wound area) ?ASSESSMENTS - Focused Assessment ?'[]'$  - Circumferential Edema Measurements - multi extremities 0 ?'[]'$  - 0 ?Nutritional Assessment / Counseling / Intervention ?'[]'$  - 0 ?Lower Extremity Assessment (monofilament, tuning fork, pulses) ?'[]'$  - 0 ?Peripheral Arterial Disease Assessment (using hand held doppler) ?ASSESSMENTS - Ostomy and/or Continence Assessment and Care ?'[]'$  - Incontinence Assessment and Management 0 ?'[]'$  - 0 ?Ostomy Care Assessment and Management (repouching, etc.) ?PROCESS - Coordination of Care ?'[]'$  - Simple Patient / Family Education for ongoing care 0 ?'[]'$  - 0 ?Complex (extensive) Patient / Family Education for ongoing care ?'[]'$  - 0 ?Staff obtains Consents, Records, Test Results / Process Orders ?'[]'$  - 0 ?Staff telephones HHA, Nursing Homes / Clarify orders / etc ?'[]'$  - 0 ?Routine Transfer to another Facility (non-emergent condition) ?'[]'$  - 0 ?Routine Hospital Admission (non-emergent condition) ?'[]'$  - 0 ?New Admissions / Biomedical engineer / Ordering NPWT, Apligraf, etc. ?'[]'$  - 0 ?Emergency Hospital Admission (emergent condition) ?'[]'$  - 0 ?Simple Discharge Coordination ?'[]'$  - 0 ?Complex (extensive) Discharge Coordination ?PROCESS - Special Needs ?'[]'$  - Pediatric / Minor Patient Management 0 ?'[]'$  - 0 ?Isolation Patient Management ?'[]'$  - 0 ?Hearing / Language / Visual special needs ?'[]'$  - 0 ?Assessment of Community assistance (transportation, D/C planning, etc.) ?'[]'$  - 0 ?Additional assistance / Altered mentation ?'[]'$  - 0 ?Support Surface(s) Assessment (bed, cushion, seat, etc.) ?INTERVENTIONS - Wound Cleansing / Measurement ?[name] ([id number]) ?'[]'$  -  0 ?Simple Wound Cleansing - one wound ?'[]'$  - 0 ?Complex Wound Cleansing - multiple wounds ?'[]'$  - 0 ?Wound Imaging (photographs - any number of wounds) ?'[]'$  - 0 ?Wound Tracing (instead of photographs) ?'[]'$  - 0 ?Simple Wound  Measurement - one wound ?'[]'$  - 0 ?Complex Wound Measurement - multiple wounds ?INTERVENTIONS - Wound Dressings ?'[]'$  - Small Wound Dressing one or multiple wounds 0 ?'[]'$  - 0 ?Medium Wound Dressing one or multiple wounds ?'[]'$  - 0 ?Large Wound Dressing one or multiple wounds ?'[]'$  - 0 ?Application of Medications - topical ?'[]'$  - 0 ?Application of Medications - injection ?INTERVENTIONS - Miscellaneous ?'[]'$  - External ear exam 0 ?'[]'$  - 0 ?Specimen Collection (cultures, biopsies, blood, body fluids, etc.) ?'[]'$  - 0 ?Specimen(s) / Culture(s) sent or taken to Lab for analysis ?'[]'$  - 0 ?Patient Transfer (multiple staff / Civil Service fast streamer / Similar devices) ?'[]'$  - 0 ?Simple Staple / Suture removal (25 or less) ?'[]'$  - 0 ?Complex Staple / Suture removal (26 or more) ?'[]'$  - 0 ?Hypo / Hyperglycemic Management (close monitor of Blood Glucose) ?'[]'$  - 0 ?Ankle / Brachial Index (ABI) - do not check if billed separately ?'[]'$  - 0 ?Vital Signs ?Has the patient been seen at the hospital within the last three years: Yes ?Total Score: 0 ?Level Of Care: ____ ?Electronic Signature(s) ?Signed: 03/17/2022 4:22:54 PM By: Levora Dredge ?Entered By: Levora Dredge on 03/17/2022 12:49:07 ?[name] ([id number]) ?-------------------------------------------------------------------------------- ?Encounter Discharge Information Details ?Patient Name: Susan Myers, [name] ?Date of Service: 03/17/2022 10:30 AM ?Medical Record Number: [id number] ?Patient Account Number: [id number] ?Date of Birth/Sex: 11/10/1951 (71 y.o. F) ?Treating RN: Levora Dredge ?Primary Care Christian Borgerding: Fulton Reek Other Clinician: ?Referring Thiago Ragsdale: Fulton Reek ?Treating Darsha Zumstein/Extender: Jeri Cos ?Weeks in Treatment: 3 ?Encounter Discharge Information  Items ?Discharge Condition: Stable ?Ambulatory Status: Ambulatory ?Discharge Destination: Home ?Transportation: Private Auto ?Accompanied By: husband ?Schedule Follow-up Appointment: Yes ?Clinical Summary of Care: Patient Declined ?Electronic Signature(s) ?Signed: 03/17/2022 12:50:09 PM By: Levora Dredge ?Entered By: Levora Dredge on 03/17/2022 12:50:09 ?[name] ([id number]) ?-------------------------------------------------------------------------------- ?Lower Extremity Assessment Details ?Patient Name: Susan Myers, [name] ?Date of Service: 03/17/2022 10:30 AM ?Medical Record Number: [id number] ?Patient Account Number: [id number] ?Date of Birth/Sex: August 08, 1951 (71 y.o. F) ?Treating RN: Levora Dredge ?Primary Care Porschea Borys: Fulton Reek Other Clinician: ?Referring Kesean Serviss: Fulton Reek ?Treating Zed Wanninger/Extender: Jeri Cos ?Weeks in Treatment: 3 ?Electronic Signature(s) ?Signed: 03/17/2022 4:22:54 PM By: Levora Dredge ?Entered By: Levora Dredge on 03/17/2022 10:55:44 ?[name] ([id number]) ?-------------------------------------------------------------------------------- ?Multi Wound Chart Details ?Patient Name: Susan Myers, [name] ?Date of Service: 03/17/2022 10:30 AM ?Medical Record Number: [id number] ?Patient Account Number: [id number] ?Date of Birth/Sex: [birth year] (71 y.o. F) ?Treating RN: Levora Dredge ?Primary Care Ikesha Siller: Fulton Reek Other Clinician: ?Referring Lliam Hoh: Fulton Reek ?Treating Aubriee Szeto/Extender: Jeri Cos ?Weeks in Treatment: 3 ?Vital Signs ?Height(in): 62 ?Pulse(bpm): 86 ?Weight(lbs): 200 ?Blood Pressure(mmHg): 145/89 ?Body Mass Index(BMI): 36.6 ?Temperature(??F): 97.8 ?Respiratory Rate(breaths/min): 18 ?Photos: [N/A:N/A] ?Wound Location: Head - Parietal N/A N/A ?Wounding Event: Gradually Appeared N/A N/A ?Primary Etiology: Atypical N/A N/A ?Comorbid History: Deep Vein Thrombosis, N/A N/A ?Hypertension, Vasculitis ?Date Acquired: 12/15/2018  N/A N/A ?Weeks of Treatment: 3 N/A N/A ?Wound Status: Open N/A N/A ?Wound Recurrence: No N/A N/A ?Measurements L x W x D (cm) 3.2x1.3x0.1 N/A N/A ?Area (cm?) : 3.267 N/A N/A ?Volume (cm?) : 0.327 N/A N/A ?% Reduction

## 2022-03-17 NOTE — Progress Notes (Addendum)
[name] ([id number]) ?Visit Report for 03/17/2022 ?Chief Complaint Document Details ?Patient Name: Susan Myers, [name] ?Date of Service: 03/17/2022 10:30 AM ?Medical Record Number: [id number] ?Patient Account Number: [id number] ?Date of Birth/Sex: 1951/04/13 (71 y.o. F) ?Treating RN: Levora Dredge ?Primary Care Provider: Fulton Reek Other Clinician: ?Referring Provider: Fulton Reek ?Treating Provider/Extender: Jeri Cos ?Weeks in Treatment: 3 ?Information Obtained from: Patient ?Chief Complaint ?Scalp Actinic Keratosis ?Electronic Signature(s) ?Signed: 03/17/2022 10:29:00 AM By: Worthy Keeler PA-C ?Entered By: Worthy Keeler on 03/17/2022 10:28:59 ?[name] ([id number]) ?-------------------------------------------------------------------------------- ?HPI Details ?Patient Name: Susan Myers, [name] ?Date of Service: 03/17/2022 10:30 AM ?Medical Record Number: [id number] ?Patient Account Number: [id number] ?Date of Birth/Sex: 1951/07/23 (71 y.o. F) ?Treating RN: Levora Dredge ?Primary Care Provider: Fulton Reek Other Clinician: ?Referring Provider: Fulton Reek ?Treating Provider/Extender: Jeri Cos ?Weeks in Treatment: 3 ?History of Present Illness ?HPI Description: 02/21/2022 upon evaluation today patient presents for initial evaluation here in the clinic concerning an issue she has been ?having with her scalp. This is a biopsy confirmed actinic keratosis which has been managed by Dr. Nehemiah Massed for the past 2 years. The patient tells ?me currently that he has been performing debridement to clear away some of the scab when it would form. Subsequently he would then put some ?ointment on it typically mupirocin is what it sounds like was the case according to the patient and subsequently that would seem to do better for ?time. However it is never really healed up and continues to be an issue which over the past 2 years has caused her significant problems. She is ?notably  frustrated during the office visit today not necessarily with me but just with the process altogether which I can completely understand. ?She does have a history of hypertension, chronic venous insufficiency, and delusional disorders but otherwise no major medical problems. ?Subsequently she has not seen any other specialist at [facility] or otherwise as far as dermatology is concerned she has not had any procedures such ?as laser, cryotherapy, etc. ?03-03-2022 upon evaluation today patient appears to be doing well with regard to her wound on the scalp. This is actually healing quite nicely. ?With that being said she continues to have significant issues here with the actinic keratosis and I am afraid that even if we get this healed it is ?going to be apt to reopen. I discussed that with her as well. Fortunately I do not see any signs of active infection locally nor systemically. ?03-10-2022 upon evaluation today patient appears to be doing well with regard to her scalp ulceration. Nonetheless this is an actinic keratosis type ?issue and subsequently she is still having significant issues here with being somewhat friable and bleeding although with the [facility] this is ?much better than anything we have seen previous. Fortunately I do not see any evidence of active infection locally or systemically which is great ?news. No fevers, chills, nausea, vomiting, or diarrhea. ?03-17-2022 upon evaluation today patient appears to be showing some signs of more hypergranulation in regard to the scalp wound. With that ?being said I do not see any signs of active infection locally or systemically which is great news. ?Electronic Signature(s) ?Signed: 03/17/2022 11:21:31 AM By: Worthy Keeler PA-C ?Entered By: Worthy Keeler on 03/17/2022 11:21:31 ?[name] ([id number]) ?-------------------------------------------------------------------------------- ?CHEM CAUT GRANULATION TISS Details ?Patient Name: Susan Myers, [name] ?Date of Service: 03/17/2022 10:30 AM ?Medical Record Number: [id number] ?Patient Account Number: [id number] ?Date of Birth/Sex: [birth year] (71 y.o. F) ?Treating RN: Araceli Bouche,  Caitlin ?Primary Care Provider: Fulton Reek Other Clinician: ?Referring Provider: Fulton Reek ?Treating Provider/Extender: Jeri Cos ?Weeks in Treatment: 3 ?Procedure Performed for: Wound #1 Head - Parietal ?Performed By: Physician Tommie Sams., PA-C ?Post Procedure Diagnosis ?Same as Pre-procedure ?Notes ?1 stick silver nitrate used ?Electronic Signature(s) ?Signed: 03/17/2022 4:22:54 PM By: Levora Dredge ?Entered By: Levora Dredge on 03/17/2022 10:59:15 ?[name] ([id number]) ?-------------------------------------------------------------------------------- ?Physical Exam Details ?Patient Name: Susan Myers, [name] ?Date of Service: 03/17/2022 10:30 AM ?Medical Record Number: [id number] ?Patient Account Number: [id number] ?Date of Birth/Sex: August 15, 1951 (71 y.o. F) ?Treating RN: Levora Dredge ?Primary Care Provider: Fulton Reek Other Clinician: ?Referring Provider: Fulton Reek ?Treating Provider/Extender: Jeri Cos ?Weeks in Treatment: 3 ?Constitutional ?Well-nourished and well-hydrated in no acute distress. ?Respiratory ?normal breathing without difficulty. ?Psychiatric ?this patient is able to make decisions and demonstrates good insight into disease process. Alert and Oriented x 3. pleasant and cooperative. ?Notes ?Patient's wound currently showed hypergranulation we are going to try to use some silver nitrate to not back some of the hypergranular tissue ?and the patient is in agreement with that plan. ?Electronic Signature(s) ?Signed: 03/17/2022 11:21:56 AM By: Worthy Keeler PA-C ?Entered By: Worthy Keeler on 03/17/2022 11:21:55 ?[name] ([id number]) ?-------------------------------------------------------------------------------- ?Physician Orders Details ?Patient Name: Susan Myers, [name] ?Date of Service: 03/17/2022 10:30 AM ?Medical Record Number: [id number] ?Patient Account Number: [id number] ?Date of Birth/Sex: July 16, 1951 (71 y.o. F) ?Treating RN: Levora Dredge ?Primary Care Provider: Fulton Reek Other Clinician: ?Referring Provider: Fulton Reek ?Treating Provider/Extender: Jeri Cos ?Weeks in Treatment: 3 ?Verbal / Phone Orders: No ?Diagnosis Coding ?ICD-10 Coding ?Code Description ?L57.0 Actinic keratosis ?L98.492 Non-pressure chronic ulcer of skin of other sites with fat layer exposed ?I10 Essential (primary) hypertension ?I87.2 Venous insufficiency (chronic) (peripheral) ?F22 Delusional disorders ?Follow-up Appointments ?o Return Appointment in 1 week. ?Wound Treatment ?Wound #1 - Head - Parietal ?Cleanser: Byram Ancillary Kit - 15 Day Supply (Generic) 1 x Per Day/30 Days ?Discharge Instructions: Use supplies as instructed; Kit contains: (15) Saline Bullets; (15) 3x3 Gauze; 15 pr Gloves ?Cleanser: Soap and Water 1 x Per Day/30 Days ?Discharge Instructions: Gently cleanse wound with antibacterial soap, rinse and pat dry prior to dressing wounds ?Topical: Triple Antibiotic Ointment, 1 (oz) Tube 1 x Per Day/30 Days ?Discharge Instructions: applied around edges of wound to prevent sticking ?Primary Dressing: Hydrofera Blue Ready Transfer Foam, 2.5x2.5 (in/in) (DME) (Generic) 1 x Per Day/30 Days ?Discharge Instructions: Apply Hydrofera Blue Ready to wound bed as directed ?Secondary Dressing: Gauze 1 x Per Day/30 Days ?Discharge Instructions: As directed: dry, moistened with saline or moistened with Dakins Solution ?Electronic Signature(s) ?Signed: 03/17/2022 4:22:54 PM By: Levora Dredge ?Signed: 03/17/2022 5:11:41 PM By: Worthy Keeler PA-C ?Entered By: Levora Dredge on 03/17/2022 11:05:55 ?[name] ([id number]) ?-------------------------------------------------------------------------------- ?Problem List Details ?Patient Name: Susan Myers, [name] ?Date of Service:  03/17/2022 10:30 AM ?Medical Record Number: [id number] ?Patient Account Number: [id number] ?Date of Birth/Sex: 1950-11-30 (71 y.o. F) ?Treating RN: Levora Dredge ?Primary Care Provider: Fulton Reek Other Clinician:

## 2022-03-24 ENCOUNTER — Encounter: Payer: PPO | Attending: Physician Assistant | Admitting: Physician Assistant

## 2022-03-24 DIAGNOSIS — L570 Actinic keratosis: Secondary | ICD-10-CM | POA: Insufficient documentation

## 2022-03-24 DIAGNOSIS — F22 Delusional disorders: Secondary | ICD-10-CM | POA: Insufficient documentation

## 2022-03-24 DIAGNOSIS — I10 Essential (primary) hypertension: Secondary | ICD-10-CM | POA: Insufficient documentation

## 2022-03-24 DIAGNOSIS — I872 Venous insufficiency (chronic) (peripheral): Secondary | ICD-10-CM | POA: Insufficient documentation

## 2022-03-24 DIAGNOSIS — L98492 Non-pressure chronic ulcer of skin of other sites with fat layer exposed: Secondary | ICD-10-CM | POA: Insufficient documentation

## 2022-03-24 NOTE — Progress Notes (Signed)
[name] ([id number]) ?Visit Report for 03/24/2022 ?Arrival Information Details ?Patient Name: Susan Myers, [name] ?Date of Service: 03/24/2022 10:30 AM ?Medical Record Number: [id number] ?Patient Account Number: [id number] ?Date of Birth/Sex: 08-Aug-1951 (71 y.o. F) ?Treating RN: Levora Dredge ?Primary Care Yajaira Doffing: Fulton Reek Other Clinician: ?Referring Dathan Attia: Fulton Reek ?Treating Carrie Usery/Extender: Jeri Cos ?Weeks in Treatment: 4 ?Visit Information History Since Last Visit ?Added or deleted any medications: No ?Patient Arrived: Ambulatory ?Any new allergies or adverse reactions: No ?Arrival Time: 10:35 ?Hospitalized since last visit: No ?Accompanied By: husband ?Has Dressing in Place as Prescribed: Yes ?Transfer Assistance: None ?Pain Present Now: No ?Patient Identification Verified: Yes ?Secondary Verification Process Completed: Yes ?Patient Requires Transmission-Based No ?Precautions: ?Patient Has Alerts: Yes ?Patient Alerts: Patient on Blood ?Thinner ?Electronic Signature(s) ?Signed: 03/24/2022 4:42:16 PM By: Levora Dredge ?Entered By: Levora Dredge on 03/24/2022 10:37:07 ?[name] ([id number]) ?-------------------------------------------------------------------------------- ?Clinic Level of Care Assessment Details ?Patient Name: Susan Myers, [name] ?Date of Service: 03/24/2022 10:30 AM ?Medical Record Number: [id number] ?Patient Account Number: [id number] ?Date of Birth/Sex: 23-Oct-1951 (71 y.o. F) ?Treating RN: Levora Dredge ?Primary Care Laranda Burkemper: Fulton Reek Other Clinician: ?Referring Marsel Gail: Fulton Reek ?Treating Aireonna Bauer/Extender: Jeri Cos ?Weeks in Treatment: 4 ?Clinic Level of Care Assessment Items ?TOOL 1 Quantity Score ?'[]'$  - Use when EandM and Procedure is performed on INITIAL visit 0 ?ASSESSMENTS - Nursing Assessment / Reassessment ?'[]'$  - General Physical Exam (combine w/ comprehensive assessment (listed just below) when performed on new ?0 ?pt.  evals) ?'[]'$  - 0 ?Comprehensive Assessment (HX, ROS, Risk Assessments, Wounds Hx, etc.) ?ASSESSMENTS - Wound and Skin Assessment / Reassessment ?'[]'$  - Dermatologic / Skin Assessment (not related to wound area) 0 ?ASSESSMENTS - Ostomy and/or Continence Assessment and Care ?'[]'$  - Incontinence Assessment and Management 0 ?'[]'$  - 0 ?Ostomy Care Assessment and Management (repouching, etc.) ?PROCESS - Coordination of Care ?'[]'$  - Simple Patient / Family Education for ongoing care 0 ?'[]'$  - 0 ?Complex (extensive) Patient / Family Education for ongoing care ?'[]'$  - 0 ?Staff obtains Consents, Records, Test Results / Process Orders ?'[]'$  - 0 ?Staff telephones HHA, Nursing Homes / Clarify orders / etc ?'[]'$  - 0 ?Routine Transfer to another Facility (non-emergent condition) ?'[]'$  - 0 ?Routine Hospital Admission (non-emergent condition) ?'[]'$  - 0 ?New Admissions / Biomedical engineer / Ordering NPWT, Apligraf, etc. ?'[]'$  - 0 ?Emergency Hospital Admission (emergent condition) ?PROCESS - Special Needs ?'[]'$  - Pediatric / Minor Patient Management 0 ?'[]'$  - 0 ?Isolation Patient Management ?'[]'$  - 0 ?Hearing / Language / Visual special needs ?'[]'$  - 0 ?Assessment of Community assistance (transportation, D/C planning, etc.) ?'[]'$  - 0 ?Additional assistance / Altered mentation ?'[]'$  - 0 ?Support Surface(s) Assessment (bed, cushion, seat, etc.) ?INTERVENTIONS - Miscellaneous ?'[]'$  - External ear exam 0 ?'[]'$  - 0 ?Patient Transfer (multiple staff / Civil Service fast streamer / Similar devices) ?'[]'$  - 0 ?Simple Staple / Suture removal (25 or less) ?'[]'$  - 0 ?Complex Staple / Suture removal (26 or more) ?'[]'$  - 0 ?Hypo/Hyperglycemic Management (do not check if billed separately) ?'[]'$  - 0 ?Ankle / Brachial Index (ABI) - do not check if billed separately ?Has the patient been seen at the hospital within the last three years: Yes ?Total Score: 0 ?Level Of Care: ____ ?[name] ([id number]) ?Electronic Signature(s) ?Signed: 03/24/2022 4:42:16 PM By: Levora Dredge ?Entered By: Levora Dredge on 03/24/2022 10:55:04 ?[name] ([id number]) ?-------------------------------------------------------------------------------- ?Encounter Discharge Information Details ?Patient Name: Susan Myers, [name] ?Date of Service: 03/24/2022 10:30 AM ?Medical Record Number: [id number] ?Patient Account  Number: [id number] ?Date of Birth/Sex: Feb 19, 1951 (71 y.o. F) ?Treating RN: Levora Dredge ?Primary Care Kiernan Atkerson: Fulton Reek Other Clinician: ?Referring Emillee Talsma: Fulton Reek ?Treating Annaleah Arata/Extender: Jeri Cos ?Weeks in Treatment: 4 ?Encounter Discharge Information Items ?Discharge Condition: Stable ?Ambulatory Status: Ambulatory ?Discharge Destination: Home ?Transportation: Private Auto ?Accompanied By: husband ?Schedule Follow-up Appointment: Yes ?Clinical Summary of Care: Patient Declined ?Electronic Signature(s) ?Signed: 03/24/2022 4:42:16 PM By: Levora Dredge ?Entered By: Levora Dredge on 03/24/2022 10:55:46 ?[name] ([id number]) ?-------------------------------------------------------------------------------- ?Lower Extremity Assessment Details ?Patient Name: Susan Myers, [name] ?Date of Service: 03/24/2022 10:30 AM ?Medical Record Number: [id number] ?Patient Account Number: [id number] ?Date of Birth/Sex: [birth year] (71 y.o. F) ?Treating RN: Levora Dredge ?Primary Care Bless Lisenby: Fulton Reek Other Clinician: ?Referring Takerra Lupinacci: Fulton Reek ?Treating Karston Hyland/Extender: Jeri Cos ?Weeks in Treatment: 4 ?Electronic Signature(s) ?Signed: 03/24/2022 4:42:16 PM By: Levora Dredge ?Entered By: Levora Dredge on 03/24/2022 10:43:28 ?[name] ([id number]) ?-------------------------------------------------------------------------------- ?Multi Wound Chart Details ?Patient Name: Susan Myers, [name] ?Date of Service: 03/24/2022 10:30 AM ?Medical Record Number: [id number] ?Patient Account Number: [id number] ?Date of Birth/Sex: 1951-10-02 (71 y.o. F) ?Treating RN: Levora Dredge ?Primary Care Wahneta Derocher: Fulton Reek Other Clinician: ?Referring Yena Tisby: Fulton Reek ?Treating Jordanne Elsbury/Extender: Jeri Cos ?Weeks in Treatment: 4 ?Vital Signs ?Height(in): 62 ?Pulse(bpm): 75 ?Weight(lbs): 200 ?Blood Pressure(mmHg): 134/87 ?Body Mass Index(BMI): 36.6 ?Temperature(??F): 97.7 ?Respiratory Rate(breaths/min): 18 ?Photos: [N/A:N/A] ?Wound Location: Head - Parietal N/A N/A ?Wounding Event: Gradually Appeared N/A N/A ?Primary Etiology: Atypical N/A N/A ?Comorbid History: Deep Vein Thrombosis, N/A N/A ?Hypertension, Vasculitis ?Date Acquired: 12/15/2018 N/A N/A ?Weeks of Treatment: 4 N/A N/A ?Wound Status: Open N/A N/A ?Wound Recurrence: No N/A N/A ?Measurements L x W x D (cm) 4x1.5x0.1 N/A N/A ?Area (cm?) : 4.712 N/A N/A ?Volume (cm?) : 0.471 N/A N/A ?% Reduction in Area: 33.30% N/A N/A ?% Reduction in Volume: 77.80% N/A N/A ?Classification: Full Thickness Without Exposed N/A N/A ?Support Structures ?Exudate Amount: Medium N/A N/A ?Exudate Type: Serosanguineous N/A N/A ?Exudate Color: red, brown N/A N/A ?Granulation Amount: Medium (34-66%) N/A N/A ?Granulation Quality: Red, Friable N/A N/A ?Necrotic Amount: Medium (34-66%) N/A N/A ?Exposed Structures: ?Fat Layer (Subcutaneous Tissue): N/A N/A ?Yes ?Fascia: No ?Tendon: No ?Muscle: No ?Joint: No ?Bone: No ?Epithelialization: None N/A N/A ?Treatment Notes ?Electronic Signature(s) ?Signed: 03/24/2022 4:42:16 PM By: Levora Dredge ?Entered By: Levora Dredge on 03/24/2022 10:50:17 ?[name] ([id number]) ?-------------------------------------------------------------------------------- ?Multi-Disciplinary Care Plan Details ?Patient Name: Susan Myers, [name] ?Date of Service: 03/24/2022 10:30 AM ?Medical Record Number: [id number] ?Patient Account Number: [id number] ?Date of Birth/Sex: 1951-05-29 (71 y.o. F) ?Treating RN: Levora Dredge ?Primary Care Hazely Sealey: Fulton Reek Other Clinician: ?Referring Selin Eisler: Fulton Reek ?Treating  Kolby Myung/Extender: Jeri Cos ?Weeks in Treatment: 4 ?Active Inactive ?Electronic Signature(s) ?Signed: 03/24/2022 4:42:16 PM By: Levora Dredge ?Entered By: Levora Dredge on 03/24/2022 10:50:07 ?[place],

## 2022-03-24 NOTE — Progress Notes (Addendum)
[name] ([id number]) ?Visit Report for 03/24/2022 ?Chief Complaint Document Details ?Patient Name: Susan Myers, [name] ?Date of Service: 03/24/2022 10:30 AM ?Medical Record Number: [id number] ?Patient Account Number: [id number] ?Date of Birth/Sex: Dec 28, 1950 (71 y.o. F) ?Treating RN: Levora Dredge ?Primary Care Provider: Fulton Reek Other Clinician: ?Referring Provider: Fulton Reek ?Treating Provider/Extender: Jeri Cos ?Weeks in Treatment: 4 ?Information Obtained from: Patient ?Chief Complaint ?Scalp Actinic Keratosis ?Electronic Signature(s) ?Signed: 03/24/2022 10:39:52 AM By: Worthy Keeler PA-C ?Entered By: Worthy Keeler on 03/24/2022 10:39:51 ?[name] ([id number]) ?-------------------------------------------------------------------------------- ?HPI Details ?Patient Name: Susan Myers, [name] ?Date of Service: 03/24/2022 10:30 AM ?Medical Record Number: [id number] ?Patient Account Number: [id number] ?Date of Birth/Sex: Nov 29, 1950 (71 y.o. F) ?Treating RN: Levora Dredge ?Primary Care Provider: Fulton Reek Other Clinician: ?Referring Provider: Fulton Reek ?Treating Provider/Extender: Jeri Cos ?Weeks in Treatment: 4 ?History of Present Illness ?HPI Description: 02/21/2022 upon evaluation today patient presents for initial evaluation here in the clinic concerning an issue she has been ?having with her scalp. This is a biopsy confirmed actinic keratosis which has been managed by Dr. Nehemiah Massed for the past 2 years. The patient tells ?me currently that he has been performing debridement to clear away some of the scab when it would form. Subsequently he would then put some ?ointment on it typically mupirocin is what it sounds like was the case according to the patient and subsequently that would seem to do better for ?time. However it is never really healed up and continues to be an issue which over the past 2 years has caused her significant problems. She is ?notably frustrated  during the office visit today not necessarily with me but just with the process altogether which I can completely understand. ?She does have a history of hypertension, chronic venous insufficiency, and delusional disorders but otherwise no major medical problems. ?Subsequently she has not seen any other specialist at [facility] or otherwise as far as dermatology is concerned she has not had any procedures such ?as laser, cryotherapy, etc. ?03-03-2022 upon evaluation today patient appears to be doing well with regard to her wound on the scalp. This is actually healing quite nicely. ?With that being said she continues to have significant issues here with the actinic keratosis and I am afraid that even if we get this healed it is ?going to be apt to reopen. I discussed that with her as well. Fortunately I do not see any signs of active infection locally nor systemically. ?03-10-2022 upon evaluation today patient appears to be doing well with regard to her scalp ulceration. Nonetheless this is an actinic keratosis type ?issue and subsequently she is still having significant issues here with being somewhat friable and bleeding although with the [facility] this is ?much better than anything we have seen previous. Fortunately I do not see any evidence of active infection locally or systemically which is great ?news. No fevers, chills, nausea, vomiting, or diarrhea. ?03-17-2022 upon evaluation today patient appears to be showing some signs of more hypergranulation in regard to the scalp wound. With that ?being said I do not see any signs of active infection locally or systemically which is great news. ?03-24-2022 upon evaluation today patient's wound actually is showing signs of doing slightly better although honestly not as great as I would like to ?have seen. She continues to have some significant issues here and to be honest I do believe that she would benefit from continuing with the ?Hydrofera Blue though this is still  basically just barely  managing the situation. ?Electronic Signature(s) ?Signed: 03/24/2022 11:07:49 AM By: Worthy Keeler PA-C ?Entered By: Worthy Keeler on 03/24/2022 11:07:49 ?[name] ([id number]) ?-------------------------------------------------------------------------------- ?CHEM CAUT GRANULATION TISS Details ?Patient Name: Susan Myers, [name] ?Date of Service: 03/24/2022 10:30 AM ?Medical Record Number: [id number] ?Patient Account Number: [id number] ?Date of Birth/Sex: 1951-05-17 (71 y.o. F) ?Treating RN: Levora Dredge ?Primary Care Provider: Fulton Reek Other Clinician: ?Referring Provider: Fulton Reek ?Treating Provider/Extender: Jeri Cos ?Weeks in Treatment: 4 ?Procedure Performed for: Wound #1 Head - Parietal ?Performed By: Physician Tommie Sams., PA-C ?Post Procedure Diagnosis ?Same as Pre-procedure ?Notes ?1 stick silver nitrate used ?Electronic Signature(s) ?Signed: 03/24/2022 4:42:16 PM By: Levora Dredge ?Entered By: Levora Dredge on 03/24/2022 10:54:55 ?[name] ([id number]) ?-------------------------------------------------------------------------------- ?Physical Exam Details ?Patient Name: Susan Myers, [name] ?Date of Service: 03/24/2022 10:30 AM ?Medical Record Number: [id number] ?Patient Account Number: [id number] ?Date of Birth/Sex: [birth year] (71 y.o. F) ?Treating RN: Levora Dredge ?Primary Care Provider: Fulton Reek Other Clinician: ?Referring Provider: Fulton Reek ?Treating Provider/Extender: Jeri Cos ?Weeks in Treatment: 4 ?Constitutional ?Well-nourished and well-hydrated in no acute distress. ?Respiratory ?normal breathing without difficulty. ?Psychiatric ?this patient is able to make decisions and demonstrates good insight into disease process. Alert and Oriented x 3. pleasant and cooperative. ?Notes ?Upon inspection patient's wound bed actually showed signs of good granulation and epithelization at this point. There is still a lot of  drainage ?which is crusting around the edges of the triple antibiotic ointment is keeping it from sticking to the wound bed but unfortunately I just feel like ?that this is not doing quite as well is what I would love to see at this point. Fortunately she does have the appointment with dermatology when ?she returns from her trip. ?Electronic Signature(s) ?Signed: 03/24/2022 11:08:34 AM By: Worthy Keeler PA-C ?Entered By: Worthy Keeler on 03/24/2022 11:08:34 ?[name] ([id number]) ?-------------------------------------------------------------------------------- ?Physician Orders Details ?Patient Name: Susan Myers, [name] ?Date of Service: 03/24/2022 10:30 AM ?Medical Record Number: [id number] ?Patient Account Number: [id number] ?Date of Birth/Sex: 10-Apr-1951 (71 y.o. F) ?Treating RN: Levora Dredge ?Primary Care Provider: Fulton Reek Other Clinician: ?Referring Provider: Fulton Reek ?Treating Provider/Extender: Jeri Cos ?Weeks in Treatment: 4 ?Verbal / Phone Orders: No ?Diagnosis Coding ?ICD-10 Coding ?Code Description ?L57.0 Actinic keratosis ?L98.492 Non-pressure chronic ulcer of skin of other sites with fat layer exposed ?I10 Essential (primary) hypertension ?I87.2 Venous insufficiency (chronic) (peripheral) ?F22 Delusional disorders ?Follow-up Appointments ?o Return Appointment in 1 week. ?Wound Treatment ?Wound #1 - Head - Parietal ?Cleanser: Byram Ancillary Kit - 15 Day Supply (Generic) 1 x Per Day/30 Days ?Discharge Instructions: Use supplies as instructed; Kit contains: (15) Saline Bullets; (15) 3x3 Gauze; 15 pr Gloves ?Cleanser: Soap and Water 1 x Per Day/30 Days ?Discharge Instructions: Gently cleanse wound with antibacterial soap, rinse and pat dry prior to dressing wounds ?Topical: Triple Antibiotic Ointment, 1 (oz) Tube 1 x Per Day/30 Days ?Discharge Instructions: applied around edges of wound to prevent sticking ?Primary Dressing: Hydrofera Blue Ready Transfer Foam, 2.5x2.5  (in/in) (Generic) 1 x Per Day/30 Days ?Discharge Instructions: Apply Hydrofera Blue Ready to wound bed as directed ?Secondary Dressing: Gauze 1 x Per Day/30 Days ?Discharge Instructions: As directed: dry, moistened with

## 2022-04-07 ENCOUNTER — Encounter: Payer: PPO | Admitting: Physician Assistant

## 2022-04-07 DIAGNOSIS — I872 Venous insufficiency (chronic) (peripheral): Secondary | ICD-10-CM | POA: Diagnosis not present

## 2022-04-07 DIAGNOSIS — L98492 Non-pressure chronic ulcer of skin of other sites with fat layer exposed: Secondary | ICD-10-CM | POA: Diagnosis not present

## 2022-04-07 DIAGNOSIS — L570 Actinic keratosis: Secondary | ICD-10-CM | POA: Diagnosis not present

## 2022-04-07 DIAGNOSIS — I10 Essential (primary) hypertension: Secondary | ICD-10-CM | POA: Diagnosis not present

## 2022-04-07 NOTE — Progress Notes (Addendum)
[name] ([id number]) ?Visit Report for 04/07/2022 ?Chief Complaint Document Details ?Patient Name: Susan Myers, [name] ?Date of Service: 04/07/2022 10:30 AM ?Medical Record Number: [id number] ?Patient Account Number: [id number] ?Date of Birth/Sex: Dec 12, 1950 (71 y.o. F) ?Treating RN: Levora Dredge ?Primary Care Provider: Fulton Reek Other Clinician: ?Referring Provider: Fulton Reek ?Treating Provider/Extender: Jeri Cos ?Weeks in Treatment: 6 ?Information Obtained from: Patient ?Chief Complaint ?Scalp Actinic Keratosis ?Electronic Signature(s) ?Signed: 04/07/2022 10:22:12 AM By: Worthy Keeler PA-C ?Entered By: Worthy Keeler on 04/07/2022 10:22:11 ?[name] ([id number]) ?-------------------------------------------------------------------------------- ?HPI Details ?Patient Name: Susan Myers, [name] ?Date of Service: 04/07/2022 10:30 AM ?Medical Record Number: [id number] ?Patient Account Number: [id number] ?Date of Birth/Sex: [birth year] (71 y.o. F) ?Treating RN: Levora Dredge ?Primary Care Provider: Fulton Reek Other Clinician: ?Referring Provider: Fulton Reek ?Treating Provider/Extender: Jeri Cos ?Weeks in Treatment: 6 ?History of Present Illness ?HPI Description: 02/21/2022 upon evaluation today patient presents for initial evaluation here in the clinic concerning an issue she has been ?having with her scalp. This is a biopsy confirmed actinic keratosis which has been managed by Dr. Nehemiah Massed for the past 2 years. The patient tells ?me currently that he has been performing debridement to clear away some of the scab when it would form. Subsequently he would then put some ?ointment on it typically mupirocin is what it sounds like was the case according to the patient and subsequently that would seem to do better for ?time. However it is never really healed up and continues to be an issue which over the past 2 years has caused her significant problems. She is ?notably  frustrated during the office visit today not necessarily with me but just with the process altogether which I can completely understand. ?She does have a history of hypertension, chronic venous insufficiency, and delusional disorders but otherwise no major medical problems. ?Subsequently she has not seen any other specialist at [facility] or otherwise as far as dermatology is concerned she has not had any procedures such ?as laser, cryotherapy, etc. ?03-03-2022 upon evaluation today patient appears to be doing well with regard to her wound on the scalp. This is actually healing quite nicely. ?With that being said she continues to have significant issues here with the actinic keratosis and I am afraid that even if we get this healed it is ?going to be apt to reopen. I discussed that with her as well. Fortunately I do not see any signs of active infection locally nor systemically. ?03-10-2022 upon evaluation today patient appears to be doing well with regard to her scalp ulceration. Nonetheless this is an actinic keratosis type ?issue and subsequently she is still having significant issues here with being somewhat friable and bleeding although with the [facility] this is ?much better than anything we have seen previous. Fortunately I do not see any evidence of active infection locally or systemically which is great ?news. No fevers, chills, nausea, vomiting, or diarrhea. ?03-17-2022 upon evaluation today patient appears to be showing some signs of more hypergranulation in regard to the scalp wound. With that ?being said I do not see any signs of active infection locally or systemically which is great news. ?03-24-2022 upon evaluation today patient's wound actually is showing signs of doing slightly better although honestly not as great as I would like to ?have seen. She continues to have some significant issues here and to be honest I do believe that she would benefit from continuing with the ?Hydrofera Blue though this is  still basically just barely  managing the situation. ?04-07-2022 upon evaluation today patient appears to be doing well with regard to her wound. She has been tolerating the dressing changes ?without complication. Fortunately there does not appear to be any evidence of infection locally or systemically which is great news and overall I ?think that we are definitely headed in the right direction. She does have her appointment with dermatology in 2 days. ?Electronic Signature(s) ?Signed: 04/07/2022 11:04:56 AM By: Worthy Keeler PA-C ?Entered By: Worthy Keeler on 04/07/2022 11:04:55 ?[name] ([id number]) ?-------------------------------------------------------------------------------- ?Physical Exam Details ?Patient Name: Susan Myers, [name] ?Date of Service: 04/07/2022 10:30 AM ?Medical Record Number: [id number] ?Patient Account Number: [id number] ?Date of Birth/Sex: 1951/07/08 (71 y.o. F) ?Treating RN: Levora Dredge ?Primary Care Provider: Fulton Reek Other Clinician: ?Referring Provider: Fulton Reek ?Treating Provider/Extender: Jeri Cos ?Weeks in Treatment: 6 ?Constitutional ?Well-nourished and well-hydrated in no acute distress. ?Respiratory ?normal breathing without difficulty. ?Psychiatric ?this patient is able to make decisions and demonstrates good insight into disease process. Alert and Oriented x 3. pleasant and cooperative. ?Notes ?Upon inspection patient's wound bed actually showed signs of good granulation and epithelization at this point. Fortunately I do not see any ?evidence of active infection locally or systemically which is great news and overall I definitely think you are on the right track. She has been using ?the triple antibiotic ointment followed by the [facility] both of which I think are doing quite well. ?Electronic Signature(s) ?Signed: 04/07/2022 11:05:19 AM By: Worthy Keeler PA-C ?Entered By: Worthy Keeler on 04/07/2022 11:05:18 ?[name]  ([id number]) ?-------------------------------------------------------------------------------- ?Physician Orders Details ?Patient Name: Susan Myers, [name] ?Date of Service: 04/07/2022 10:30 AM ?Medical Record Number: [id number] ?Patient Account Number: [id number] ?Date of Birth/Sex: 1950/12/13 (71 y.o. F) ?Treating RN: Levora Dredge ?Primary Care Provider: Fulton Reek Other Clinician: ?Referring Provider: Fulton Reek ?Treating Provider/Extender: Jeri Cos ?Weeks in Treatment: 6 ?Verbal / Phone Orders: No ?Diagnosis Coding ?ICD-10 Coding ?Code Description ?L57.0 Actinic keratosis ?L98.492 Non-pressure chronic ulcer of skin of other sites with fat layer exposed ?I10 Essential (primary) hypertension ?I87.2 Venous insufficiency (chronic) (peripheral) ?F22 Delusional disorders ?Follow-up Appointments ?o Return Appointment in 1 week. ?Wound Treatment ?Wound #1 - Head - Parietal ?Cleanser: Byram Ancillary Kit - 15 Day Supply (Generic) 1 x Per Day/30 Days ?Discharge Instructions: Use supplies as instructed; Kit contains: (15) Saline Bullets; (15) 3x3 Gauze; 15 pr Gloves ?Cleanser: Soap and Water 1 x Per Day/30 Days ?Discharge Instructions: Gently cleanse wound with antibacterial soap, rinse and pat dry prior to dressing wounds ?Topical: Triple Antibiotic Ointment, 1 (oz) Tube 1 x Per Day/30 Days ?Discharge Instructions: applied around edges of wound to prevent sticking ?Primary Dressing: Hydrofera Blue Ready Transfer Foam, 2.5x2.5 (in/in) (Generic) 1 x Per Day/30 Days ?Discharge Instructions: Apply Hydrofera Blue Ready to wound bed as directed ?Secondary Dressing: Gauze 1 x Per Day/30 Days ?Discharge Instructions: As directed: dry, moistened with saline or moistened with Dakins Solution ?Electronic Signature(s) ?Signed: 04/07/2022 4:33:56 PM By: Levora Dredge ?Signed: 04/07/2022 5:23:27 PM By: Worthy Keeler PA-C ?Entered By: Levora Dredge on 04/07/2022 11:05:52 ?[name]  ([id number]) ?-------------------------------------------------------------------------------- ?Problem List Details ?Patient Name: SHANNARA, WINBUSH. ?Date of Service: 04/07/2022 10:30 AM ?Medical Record Number: [id number] ?Patient Account Num

## 2022-04-07 NOTE — Progress Notes (Addendum)
[name] ([id number]) ?Visit Report for 04/07/2022 ?Arrival Information Details ?Patient Name: Susan Myers, [name] ?Date of Service: 04/07/2022 10:30 AM ?Medical Record Number: [id number] ?Patient Account Number: [id number] ?Date of Birth/Sex: Jan 15, 1951 (71 y.o. F) ?Treating RN: Levora Dredge ?Primary Care Gustabo Gordillo: Fulton Reek Other Clinician: ?Referring Dreyden Rohrman: Fulton Reek ?Treating Nyeshia Mysliwiec/Extender: Jeri Cos ?Weeks in Treatment: 6 ?Visit Information History Since Last Visit ?Added or deleted any medications: No ?Patient Arrived: Ambulatory ?Any new allergies or adverse reactions: No ?Arrival Time: 10:38 ?Had a fall or experienced change in No ?Accompanied By: husband ?activities of daily living that may affect ?Transfer Assistance: None ?risk of falls: ?Patient Identification Verified: Yes ?Hospitalized since last visit: No ?Secondary Verification Process Completed: Yes ?Has Dressing in Place as Prescribed: Yes ?Patient Requires Transmission-Based No ?Pain Present Now: No ?Precautions: ?Patient Has Alerts: Yes ?Patient Alerts: Patient on Blood ?Thinner ?Electronic Signature(s) ?Signed: 04/07/2022 4:33:56 PM By: Levora Dredge ?Entered By: Levora Dredge on 04/07/2022 10:43:57 ?[name] ([id number]) ?-------------------------------------------------------------------------------- ?Clinic Level of Care Assessment Details ?Patient Name: Susan Myers, [name] ?Date of Service: 04/07/2022 10:30 AM ?Medical Record Number: [id number] ?Patient Account Number: [id number] ?Date of Birth/Sex: Sep 26, 1951 (71 y.o. F) ?Treating RN: Levora Dredge ?Primary Care Yvan Dority: Fulton Reek Other Clinician: ?Referring Lizzete Gough: Fulton Reek ?Treating Rolinda Impson/Extender: Jeri Cos ?Weeks in Treatment: 6 ?Clinic Level of Care Assessment Items ?TOOL 4 Quantity Score ?'[]'$  - Use when only an EandM is performed on FOLLOW-UP visit 0 ?ASSESSMENTS - Nursing Assessment / Reassessment ?X - Reassessment of  Co-morbidities (includes updates in patient status) 1 10 ?X- 1 5 ?Reassessment of Adherence to Treatment Plan ?ASSESSMENTS - Wound and Skin Assessment / Reassessment ?X - Simple Wound Assessment / Reassessment - one wound 1 5 ?'[]'$  - 0 ?Complex Wound Assessment / Reassessment - multiple wounds ?'[]'$  - 0 ?Dermatologic / Skin Assessment (not related to wound area) ?ASSESSMENTS - Focused Assessment ?'[]'$  - Circumferential Edema Measurements - multi extremities 0 ?'[]'$  - 0 ?Nutritional Assessment / Counseling / Intervention ?'[]'$  - 0 ?Lower Extremity Assessment (monofilament, tuning fork, pulses) ?'[]'$  - 0 ?Peripheral Arterial Disease Assessment (using hand held doppler) ?ASSESSMENTS - Ostomy and/or Continence Assessment and Care ?'[]'$  - Incontinence Assessment and Management 0 ?'[]'$  - 0 ?Ostomy Care Assessment and Management (repouching, etc.) ?PROCESS - Coordination of Care ?X - Simple Patient / Family Education for ongoing care 1 15 ?'[]'$  - 0 ?Complex (extensive) Patient / Family Education for ongoing care ?'[]'$  - 0 ?Staff obtains Consents, Records, Test Results / Process Orders ?'[]'$  - 0 ?Staff telephones HHA, Nursing Homes / Clarify orders / etc ?'[]'$  - 0 ?Routine Transfer to another Facility (non-emergent condition) ?'[]'$  - 0 ?Routine Hospital Admission (non-emergent condition) ?'[]'$  - 0 ?New Admissions / Biomedical engineer / Ordering NPWT, Apligraf, etc. ?'[]'$  - 0 ?Emergency Hospital Admission (emergent condition) ?X- 1 10 ?Simple Discharge Coordination ?'[]'$  - 0 ?Complex (extensive) Discharge Coordination ?PROCESS - Special Needs ?'[]'$  - Pediatric / Minor Patient Management 0 ?'[]'$  - 0 ?Isolation Patient Management ?'[]'$  - 0 ?Hearing / Language / Visual special needs ?'[]'$  - 0 ?Assessment of Community assistance (transportation, D/C planning, etc.) ?'[]'$  - 0 ?Additional assistance / Altered mentation ?'[]'$  - 0 ?Support Surface(s) Assessment (bed, cushion, seat, etc.) ?INTERVENTIONS - Wound Cleansing / Measurement ?[name]  ([id number]) ?X- 1 5 ?Simple Wound Cleansing - one wound ?'[]'$  - 0 ?Complex Wound Cleansing - multiple wounds ?X- 1 5 ?Wound Imaging (photographs - any number of wounds) ?'[]'$  - 0 ?Wound Tracing (instead of photographs) ?X- 1  5 ?Simple Wound Measurement - one wound ?'[]'$  - 0 ?Complex Wound Measurement - multiple wounds ?INTERVENTIONS - Wound Dressings ?X - Small Wound Dressing one or multiple wounds 1 10 ?'[]'$  - 0 ?Medium Wound Dressing one or multiple wounds ?'[]'$  - 0 ?Large Wound Dressing one or multiple wounds ?X- 1 5 ?Application of Medications - topical ?'[]'$  - 0 ?Application of Medications - injection ?INTERVENTIONS - Miscellaneous ?'[]'$  - External ear exam 0 ?'[]'$  - 0 ?Specimen Collection (cultures, biopsies, blood, body fluids, etc.) ?'[]'$  - 0 ?Specimen(s) / Culture(s) sent or taken to Lab for analysis ?'[]'$  - 0 ?Patient Transfer (multiple staff / Civil Service fast streamer / Similar devices) ?'[]'$  - 0 ?Simple Staple / Suture removal (25 or less) ?'[]'$  - 0 ?Complex Staple / Suture removal (26 or more) ?'[]'$  - 0 ?Hypo / Hyperglycemic Management (close monitor of Blood Glucose) ?'[]'$  - 0 ?Ankle / Brachial Index (ABI) - do not check if billed separately ?X- 1 5 ?Vital Signs ?Has the patient been seen at the hospital within the last three years: Yes ?Total Score: 80 ?Level Of Care: New/Established - Level ?3 ?Electronic Signature(s) ?Signed: 04/07/2022 4:33:56 PM By: Levora Dredge ?Entered By: Levora Dredge on 04/07/2022 11:06:15 ?[name] ([id number]) ?-------------------------------------------------------------------------------- ?Encounter Discharge Information Details ?Patient Name: Susan Myers, [name] ?Date of Service: 04/07/2022 10:30 AM ?Medical Record Number: [id number] ?Patient Account Number: [id number] ?Date of Birth/Sex: 1951-03-30 (71 y.o. F) ?Treating RN: Levora Dredge ?Primary Care Charvi Gammage: Fulton Reek Other Clinician: ?Referring Jeda Pardue: Fulton Reek ?Treating Maysoon Lozada/Extender: Jeri Cos ?Weeks in Treatment:  6 ?Encounter Discharge Information Items ?Discharge Condition: Stable ?Ambulatory Status: Ambulatory ?Discharge Destination: Home ?Transportation: Private Auto ?Accompanied By: husband ?Schedule Follow-up Appointment: Yes ?Clinical Summary of Care: Patient Declined ?Electronic Signature(s) ?Signed: 04/07/2022 4:33:56 PM By: Levora Dredge ?Entered By: Levora Dredge on 04/07/2022 11:06:57 ?[name] ([id number]) ?-------------------------------------------------------------------------------- ?Lower Extremity Assessment Details ?Patient Name: Susan Myers, [name] ?Date of Service: 04/07/2022 10:30 AM ?Medical Record Number: [id number] ?Patient Account Number: [id number] ?Date of Birth/Sex: 02-05-1951 (71 y.o. F) ?Treating RN: Levora Dredge ?Primary Care Averi Kilty: Fulton Reek Other Clinician: ?Referring Demaris Bousquet: Fulton Reek ?Treating Jenilyn Magana/Extender: Jeri Cos ?Weeks in Treatment: 6 ?Electronic Signature(s) ?Signed: 04/07/2022 4:33:56 PM By: Levora Dredge ?Entered By: Levora Dredge on 04/07/2022 10:50:22 ?[name] ([id number]) ?-------------------------------------------------------------------------------- ?Multi Wound Chart Details ?Patient Name: Susan Myers, [name] ?Date of Service: 04/07/2022 10:30 AM ?Medical Record Number: [id number] ?Patient Account Number: [id number] ?Date of Birth/Sex: 05-May-1951 (71 y.o. F) ?Treating RN: Levora Dredge ?Primary Care Kamran Coker: Fulton Reek Other Clinician: ?Referring Jamar Casagrande: Fulton Reek ?Treating Carlin Mamone/Extender: Jeri Cos ?Weeks in Treatment: 6 ?Vital Signs ?Height(in): 62 ?Pulse(bpm): 89 ?Weight(lbs): 200 ?Blood Pressure(mmHg): 135/83 ?Body Mass Index(BMI): 36.6 ?Temperature(??F): 97.9 ?Respiratory Rate(breaths/min): 18 ?Photos: [N/A:N/A] ?Wound Location: Head - Parietal N/A N/A ?Wounding Event: Gradually Appeared N/A N/A ?Primary Etiology: Atypical N/A N/A ?Comorbid History: Deep Vein Thrombosis, N/A N/A ?Hypertension,  Vasculitis ?Date Acquired: 12/15/2018 N/A N/A ?Weeks of Treatment: 6 N/A N/A ?Wound Status: Open N/A N/A ?Wound Recurrence: No N/A N/A ?Measurements L x W x D (cm) 3.2x1x0.1 N/A N/A ?Area (cm?) : 2.513 N/A N/A ?Volume (cm?)

## 2022-04-09 DIAGNOSIS — Z79899 Other long term (current) drug therapy: Secondary | ICD-10-CM | POA: Diagnosis not present

## 2022-04-09 DIAGNOSIS — L570 Actinic keratosis: Secondary | ICD-10-CM | POA: Diagnosis not present

## 2022-04-09 DIAGNOSIS — L989 Disorder of the skin and subcutaneous tissue, unspecified: Secondary | ICD-10-CM | POA: Diagnosis not present

## 2022-04-09 DIAGNOSIS — L988 Other specified disorders of the skin and subcutaneous tissue: Secondary | ICD-10-CM | POA: Diagnosis not present

## 2022-04-14 ENCOUNTER — Encounter: Payer: PPO | Admitting: Physician Assistant

## 2022-04-14 DIAGNOSIS — L570 Actinic keratosis: Secondary | ICD-10-CM | POA: Diagnosis not present

## 2022-04-14 DIAGNOSIS — I10 Essential (primary) hypertension: Secondary | ICD-10-CM | POA: Diagnosis not present

## 2022-04-14 DIAGNOSIS — L98492 Non-pressure chronic ulcer of skin of other sites with fat layer exposed: Secondary | ICD-10-CM | POA: Diagnosis not present

## 2022-04-14 NOTE — Progress Notes (Signed)
[name] ([id number]) Visit Report for 04/14/2022 Arrival Information Details Patient Name: Susan Myers, [name] Date of Service: 04/14/2022 10:30 AM Medical Record Number: [id number] Patient Account Number: [id number] Date of Birth/Sex: 1951-05-10 (71 y.o. F) Treating RN: Alycia Rossetti Primary Care Huie Ghuman: Fulton Reek Other Clinician: Massie Kluver Referring Phillipa Morden: Fulton Reek Treating Eustacia Urbanek/Extender: Skipper Cliche in Treatment: 7 Visit Information History Since Last Visit Added or deleted any medications: No Patient Arrived: Ambulatory Any new allergies or adverse reactions: No Arrival Time: 10:17 Had a fall or experienced change in No Accompanied By: husband activities of daily living that may affect Transfer Assistance: None risk of falls: Patient Identification Verified: Yes Has Dressing in Place as Prescribed: Yes Secondary Verification Process Completed: Yes Pain Present Now: No Patient Requires Transmission-Based No Precautions: Patient Has Alerts: Yes Patient Alerts: Patient on Blood Thinner Electronic Signature(s) Signed: 04/14/2022 4:29:51 PM By: Alycia Rossetti Entered By: Alycia Rossetti on 04/14/2022 10:21:03 [name] ([id number]) -------------------------------------------------------------------------------- Clinic Level of Care Assessment Details Patient Name: [name] Date of Service: 04/14/2022 10:30 AM Medical Record Number: [id number] Patient Account Number: [id number] Date of Birth/Sex: 1951-09-04 (71 y.o. F) Treating RN: Alycia Rossetti Primary Care Cayleb Jarnigan: Fulton Reek Other Clinician: Massie Kluver Referring Colby Reels: Fulton Reek Treating Nitish Roes/Extender: Skipper Cliche in Treatment: 7 Clinic Level of Care Assessment Items TOOL 4 Quantity Score '[]'$  - Use when only an EandM is performed on FOLLOW-UP visit 0 ASSESSMENTS - Nursing Assessment / Reassessment X - Reassessment of Co-morbidities  (includes updates in patient status) 1 10 X- 1 5 Reassessment of Adherence to Treatment Plan ASSESSMENTS - Wound and Skin Assessment / Reassessment X - Simple Wound Assessment / Reassessment - one wound 1 5 '[]'$  - 0 Complex Wound Assessment / Reassessment - multiple wounds '[]'$  - 0 Dermatologic / Skin Assessment (not related to wound area) ASSESSMENTS - Focused Assessment '[]'$  - Circumferential Edema Measurements - multi extremities 0 '[]'$  - 0 Nutritional Assessment / Counseling / Intervention '[]'$  - 0 Lower Extremity Assessment (monofilament, tuning fork, pulses) '[]'$  - 0 Peripheral Arterial Disease Assessment (using hand held doppler) ASSESSMENTS - Ostomy and/or Continence Assessment and Care '[]'$  - Incontinence Assessment and Management 0 '[]'$  - 0 Ostomy Care Assessment and Management (repouching, etc.) PROCESS - Coordination of Care X - Simple Patient / Family Education for ongoing care 1 15 '[]'$  - 0 Complex (extensive) Patient / Family Education for ongoing care X- 1 10 Staff obtains Programmer, systems, Records, Test Results / Process Orders '[]'$  - 0 Staff telephones HHA, Nursing Homes / Clarify orders / etc '[]'$  - 0 Routine Transfer to another Facility (non-emergent condition) '[]'$  - 0 Routine Hospital Admission (non-emergent condition) '[]'$  - 0 New Admissions / Biomedical engineer / Ordering NPWT, Apligraf, etc. '[]'$  - 0 Emergency Hospital Admission (emergent condition) X- 1 10 Simple Discharge Coordination '[]'$  - 0 Complex (extensive) Discharge Coordination PROCESS - Special Needs '[]'$  - Pediatric / Minor Patient Management 0 '[]'$  - 0 Isolation Patient Management '[]'$  - 0 Hearing / Language / Visual special needs '[]'$  - 0 Assessment of Community assistance (transportation, D/C planning, etc.) '[]'$  - 0 Additional assistance / Altered mentation '[]'$  - 0 Support Surface(s) Assessment (bed, cushion, seat, etc.) INTERVENTIONS - Wound Cleansing / Measurement Shearon, Estill Bakes. ([id number]) X- 1 5 Simple  Wound Cleansing - one wound '[]'$  - 0 Complex Wound Cleansing - multiple wounds X- 1 5 Wound Imaging (photographs - any number of wounds) '[]'$  - 0 Wound Tracing (instead of photographs) X- 1 5  Simple Wound Measurement - one wound '[]'$  - 0 Complex Wound Measurement - multiple wounds INTERVENTIONS - Wound Dressings '[]'$  - Small Wound Dressing one or multiple wounds 0 X- 1 15 Medium Wound Dressing one or multiple wounds '[]'$  - 0 Large Wound Dressing one or multiple wounds '[]'$  - 0 Application of Medications - topical '[]'$  - 0 Application of Medications - injection INTERVENTIONS - Miscellaneous '[]'$  - External ear exam 0 '[]'$  - 0 Specimen Collection (cultures, biopsies, blood, body fluids, etc.) '[]'$  - 0 Specimen(s) / Culture(s) sent or taken to Lab for analysis '[]'$  - 0 Patient Transfer (multiple staff / Civil Service fast streamer / Similar devices) '[]'$  - 0 Simple Staple / Suture removal (25 or less) '[]'$  - 0 Complex Staple / Suture removal (26 or more) '[]'$  - 0 Hypo / Hyperglycemic Management (close monitor of Blood Glucose) '[]'$  - 0 Ankle / Brachial Index (ABI) - do not check if billed separately X- 1 5 Vital Signs Has the patient been seen at the hospital within the last three years: Yes Total Score: 90 Level Of Care: New/Established - Level 3 Electronic Signature(s) Signed: 04/14/2022 4:29:51 PM By: Alycia Rossetti Entered By: Alycia Rossetti on 04/14/2022 10:45:41 Ridener, Estill Bakes. ([id number]) -------------------------------------------------------------------------------- Encounter Discharge Information Details Patient Name: [name] Date of Service: 04/14/2022 10:30 AM Medical Record Number: [id number] Patient Account Number: [id number] Date of Birth/Sex: Feb 15, 1951 (71 y.o. F) Treating RN: Alycia Rossetti Primary Care Bellarae Lizer: Fulton Reek Other Clinician: Massie Kluver Referring Dani Danis: Fulton Reek Treating Tinsley Everman/Extender: Skipper Cliche in Treatment: 7 Encounter Discharge  Information Items Discharge Condition: Stable Ambulatory Status: Ambulatory Discharge Destination: Home Transportation: Private Auto Accompanied By: Husband Schedule Follow-up Appointment: Yes Clinical Summary of Care: Patient Declined Electronic Signature(s) Signed: 04/14/2022 4:29:51 PM By: Alycia Rossetti Entered By: Alycia Rossetti on 04/14/2022 10:49:49 Perdomo, Estill Bakes. ([id number]) -------------------------------------------------------------------------------- Lower Extremity Assessment Details Patient Name: LORENE, KLIMAS. Date of Service: 04/14/2022 10:30 AM Medical Record Number: [id number] Patient Account Number: [id number] Date of Birth/Sex: 1950-12-02 (71 y.o. F) Treating RN: Alycia Rossetti Primary Care Hazen Brumett: Fulton Reek Other Clinician: Massie Kluver Referring Floetta Brickey: Fulton Reek Treating Slayter Moorhouse/Extender: Skipper Cliche in Treatment: 7 Electronic Signature(s) Signed: 04/14/2022 4:29:51 PM By: Alycia Rossetti Entered By: Alycia Rossetti on 04/14/2022 10:28:16 Omara, Estill Bakes. ([id number]) -------------------------------------------------------------------------------- Multi Wound Chart Details Patient Name: TAUNIA, FRASCO. Date of Service: 04/14/2022 10:30 AM Medical Record Number: [id number] Patient Account Number: [id number] Date of Birth/Sex: May 22, 1951 (71 y.o. F) Treating RN: Alycia Rossetti Primary Care Anterrio Mccleery: Fulton Reek Other Clinician: Massie Kluver Referring Thornton Dohrmann: Fulton Reek Treating Tank Difiore/Extender: Skipper Cliche in Treatment: 7 Vital Signs Height(in): 20 Pulse(bpm): 30 Weight(lbs): 200 Blood Pressure(mmHg): 121/82 Body Mass Index(BMI): 36.6 Temperature(F): 97.2 Respiratory Rate(breaths/min): 16 Photos: [N/A:N/A] Wound Location: Head - Parietal N/A N/A Wounding Event: Gradually Appeared N/A N/A Primary Etiology: Atypical N/A N/A Comorbid History: Deep Vein Thrombosis, N/A N/A Hypertension,  Vasculitis Date Acquired: 12/15/2018 N/A N/A Weeks of Treatment: 7 N/A N/A Wound Status: Open N/A N/A Wound Recurrence: No N/A N/A Measurements L x W x D (cm) 1.3x0.2x0.1 N/A N/A Area (cm) : 0.204 N/A N/A Volume (cm) : 0.02 N/A N/A % Reduction in Area: 97.10% N/A N/A % Reduction in Volume: 99.10% N/A N/A Classification: Full Thickness Without Exposed N/A N/A Support Structures Exudate Amount: Medium N/A N/A Exudate Type: Serous N/A N/A Exudate Color: amber N/A N/A Granulation Amount: Large (67-100%) N/A N/A Granulation Quality: Red N/A N/A Necrotic Amount: Small (1-33%) N/A N/A Exposed Structures: Fat Layer (Subcutaneous  Tissue): N/A N/A Yes Fascia: No Tendon: No Muscle: No Joint: No Bone: No Epithelialization: Medium (34-66%) N/A N/A Treatment Notes Electronic Signature(s) Signed: 04/14/2022 4:29:51 PM By: Alycia Rossetti Entered By: Alycia Rossetti on 04/14/2022 10:36:06 Babineau, Estill Bakes. ([id number]) -------------------------------------------------------------------------------- [place] Details Patient Name: ECHO, PROPP. Date of Service: 04/14/2022 10:30 AM Medical Record Number: [id number] Patient Account Number: [id number] Date of Birth/Sex: [birth year] (71 y.o. F) Treating RN: Alycia Rossetti Primary Care Mariavictoria Nottingham: Fulton Reek Other Clinician: Massie Kluver Referring Tahisha Hakim: Fulton Reek Treating Mclain Freer/Extender: Skipper Cliche in Treatment: 7 Active Inactive Electronic Signature(s) Signed: 04/14/2022 4:29:51 PM By: Alycia Rossetti Entered By: Alycia Rossetti on 04/14/2022 10:35:56 Baldwin, Estill Bakes. ([id number]) -------------------------------------------------------------------------------- Pain Assessment Details Patient Name: HILDEGARDE, DUNAWAY. Date of Service: 04/14/2022 10:30 AM Medical Record Number: [id number] Patient Account Number: [id number] Date of Birth/Sex: 1951-11-11 (71 y.o. F) Treating RN: Alycia Rossetti Primary  Care Keshonda Monsour: Fulton Reek Other Clinician: Massie Kluver Referring Macel Yearsley: Fulton Reek Treating Smita Lesh/Extender: Skipper Cliche in Treatment: 7 Active Problems Location of Pain Severity and Description of Pain Patient Has Paino No Site Locations Pain Management and Medication Current Pain Management: Electronic Signature(s) Signed: 04/14/2022 4:29:51 PM By: Alycia Rossetti Entered By: Alycia Rossetti on 04/14/2022 10:22:20 [name] ([id number]) -------------------------------------------------------------------------------- Patient/Caregiver Education Details Patient Name: TALYSSA, GIBAS. Date of Service: 04/14/2022 10:30 AM Medical Record Number: [id number] Patient Account Number: [id number] Date of Birth/Gender: 1951-10-27 (71 y.o. F) Treating RN: Alycia Rossetti Primary Care Physician: Fulton Reek Other Clinician: Massie Kluver Referring Physician: Fulton Reek Treating Physician/Extender: Skipper Cliche in Treatment: 7 Education Assessment Education Provided To: Patient Education Topics Provided Wound/Skin Impairment: Handouts: Caring for Your Ulcer, Other: continue dressing as prescribed Methods: Explain/Verbal Responses: State content correctly Electronic Signature(s) Signed: 04/14/2022 4:29:51 PM By: Alycia Rossetti Entered By: Alycia Rossetti on 04/14/2022 10:48:06 Fleming, Estill Bakes. ([id number]) -------------------------------------------------------------------------------- Wound Assessment Details Patient Name: LAIKEN, SANDY. Date of Service: 04/14/2022 10:30 AM Medical Record Number: [id number] Patient Account Number: [id number] Date of Birth/Sex: [birth year] (71 y.o. F) Treating RN: Alycia Rossetti Primary Care Etna Forquer: Fulton Reek Other Clinician: Massie Kluver Referring Erminio Nygard: Fulton Reek Treating Dianah Pruett/Extender: Skipper Cliche in Treatment: 7 Wound Status Wound Number: 1 Primary Etiology:  Atypical Wound Location: Head - Parietal Wound Status: Open Wounding Event: Gradually Appeared Comorbid History: Deep Vein Thrombosis, Hypertension, Vasculitis Date Acquired: 12/15/2018 Weeks Of Treatment: 7 Clustered Wound: No Photos Wound Measurements Length: (cm) 1.3 Width: (cm) 0.2 Depth: (cm) 0.1 Area: (cm) 0.204 Volume: (cm) 0.02 % Reduction in Area: 97.1% % Reduction in Volume: 99.1% Epithelialization: Medium (34-66%) Tunneling: No Undermining: No Wound Description Classification: Full Thickness Without Exposed Support Structures Exudate Amount: Medium Exudate Type: Serous Exudate Color: amber Foul Odor After Cleansing: No Slough/Fibrino Yes Wound Bed Granulation Amount: Large (67-100%) Exposed Structure Granulation Quality: Red Fascia Exposed: No Necrotic Amount: Small (1-33%) Fat Layer (Subcutaneous Tissue) Exposed: Yes Necrotic Quality: Adherent Slough Tendon Exposed: No Muscle Exposed: No Joint Exposed: No Bone Exposed: No Treatment Notes Wound #1 (Head - Parietal) Cleanser Soap and Water Discharge Instruction: Gently cleanse wound with antibacterial soap, rinse and pat dry prior to dressing wounds Peri-Wound Care SHEARON, CLONCH M. ([id number]) Topical Triple Antibiotic Ointment, 1 (oz) Tube Discharge Instruction: applied on wound to prevent sticking Primary Dressing Hydrofera Blue Ready Transfer Foam, 2.5x2.5 (in/in) Discharge Instruction: Apply Hydrofera Blue Ready to wound bed as directed Secondary Dressing Gauze Discharge Instruction: As directed: dry, moistened with saline or moistened with Dakins Solution Secured With Compression  Wrap Compression Stockings Add-Ons Electronic Signature(s) Signed: 04/14/2022 4:29:51 PM By: Alycia Rossetti Entered By: Alycia Rossetti on 04/14/2022 10:27:55 Helbig, Estill Bakes. ([id number]) -------------------------------------------------------------------------------- Vitals Details Patient Name: SHERILEE, SMOTHERMAN. Date of Service: 04/14/2022 10:30 AM Medical Record Number: [id number] Patient Account Number: [id number] Date of Birth/Sex: [birth year] (71 y.o. F) Treating RN: Alycia Rossetti Primary Care Sophira Rumler: Fulton Reek Other Clinician: Massie Kluver Referring Babatunde Seago: Fulton Reek Treating Zamyia Gowell/Extender: Skipper Cliche in Treatment: 7 Vital Signs Time Taken: 10:21 Temperature (F): 97.2 Height (in): 62 Pulse (bpm): 85 Weight (lbs): 200 Respiratory Rate (breaths/min): 16 Body Mass Index (BMI): 36.6 Blood Pressure (mmHg): 121/82 Reference Range: 80 - 120 mg / dl Electronic Signature(s) Signed: 04/14/2022 4:29:51 PM By: Alycia Rossetti Entered By: Alycia Rossetti on 04/14/2022 10:22:11

## 2022-04-14 NOTE — Progress Notes (Addendum)
[name] ([id number]) Visit Report for 04/14/2022 Chief Complaint Document Details Patient Name: HOMER, PFEIFER. Date of Service: 04/14/2022 10:30 AM Medical Record Number: [id number] Patient Account Number: [id number] Date of Birth/Sex: 08/08/1951 (71 y.o. F) Treating RN: Cornell Barman Primary Care Provider: Fulton Reek Other Clinician: Massie Kluver Referring Provider: Fulton Reek Treating Provider/Extender: Skipper Cliche in Treatment: 7 Information Obtained from: Patient Chief Complaint Scalp Actinic Keratosis Electronic Signature(s) Signed: 04/14/2022 10:29:51 AM By: Worthy Keeler PA-C Entered By: Worthy Keeler on 04/14/2022 10:29:51 Lancon, Estill Bakes. ([id number]) -------------------------------------------------------------------------------- HPI Details Patient Name: GENORA, ARP. Date of Service: 04/14/2022 10:30 AM Medical Record Number: [id number] Patient Account Number: [id number] Date of Birth/Sex: 1950/11/25 (71 y.o. F) Treating RN: Cornell Barman Primary Care Provider: Fulton Reek Other Clinician: Massie Kluver Referring Provider: Fulton Reek Treating Provider/Extender: Skipper Cliche in Treatment: 7 History of Present Illness HPI Description: 02/21/2022 upon evaluation today patient presents for initial evaluation here in the clinic concerning an issue she has been having with her scalp. This is a biopsy confirmed actinic keratosis which has been managed by Dr. Nehemiah Massed for the past 2 years. The patient tells me currently that he has been performing debridement to clear away some of the scab when it would form. Subsequently he would then put some ointment on it typically mupirocin is what it sounds like was the case according to the patient and subsequently that would seem to do better for time. However it is never really healed up and continues to be an issue which over the past 2 years has caused her significant problems.  She is notably frustrated during the office visit today not necessarily with me but just with the process altogether which I can completely understand. She does have a history of hypertension, chronic venous insufficiency, and delusional disorders but otherwise no major medical problems. Subsequently she has not seen any other specialist at [facility] or otherwise as far as dermatology is concerned she has not had any procedures such as laser, cryotherapy, etc. 03-03-2022 upon evaluation today patient appears to be doing well with regard to her wound on the scalp. This is actually healing quite nicely. With that being said she continues to have significant issues here with the actinic keratosis and I am afraid that even if we get this healed it is going to be apt to reopen. I discussed that with her as well. Fortunately I do not see any signs of active infection locally nor systemically. 03-10-2022 upon evaluation today patient appears to be doing well with regard to her scalp ulceration. Nonetheless this is an actinic keratosis type issue and subsequently she is still having significant issues here with being somewhat friable and bleeding although with the [facility] this is much better than anything we have seen previous. Fortunately I do not see any evidence of active infection locally or systemically which is great news. No fevers, chills, nausea, vomiting, or diarrhea. 03-17-2022 upon evaluation today patient appears to be showing some signs of more hypergranulation in regard to the scalp wound. With that being said I do not see any signs of active infection locally or systemically which is great news. 03-24-2022 upon evaluation today patient's wound actually is showing signs of doing slightly better although honestly not as great as I would like to have seen. She continues to have some significant issues here and to be honest I do believe that she would benefit from continuing with the [facility]  though this is  still basically just barely managing the situation. 04-07-2022 upon evaluation today patient appears to be doing well with regard to her wound. She has been tolerating the dressing changes without complication. Fortunately there does not appear to be any evidence of infection locally or systemically which is great news and overall I think that we are definitely headed in the right direction. She does have her appointment with dermatology in 2 days. 04-14-2022 upon evaluation today patient appears to be doing well currently in regard to her wound on the scalp. She did see dermatology and they did go through some things with her. 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 They subsequently added a different treatment option into the mix for her putting clobetasol around the edges of the wound and then using Neosporin just over the surface of the main wound area. With that being said overall this seems to have done quite well for her and things are significantly improved. Fortunately I do not see any evidence of active infection locally nor systemically which is great news and in general she looks to be almost completely healed which is great news as well. Electronic Signature(s) Signed: 04/14/2022 11:07:07 AM By: Worthy Keeler PA-C Entered By: Worthy Keeler on 04/14/2022 11:07:07 [name] ([id number]) -------------------------------------------------------------------------------- Physical Exam Details Patient Name: ZIAIRE, BIESER. Date of Service: 04/14/2022 10:30 AM Medical Record Number: [id number] Patient Account Number: [id number] Date of Birth/Sex: Apr 27, 1951 (71 y.o. F) Treating RN: Cornell Barman Primary Care Provider: Fulton Reek Other Clinician: Massie Kluver Referring Provider: Fulton Reek Treating Provider/Extender: Skipper Cliche in Treatment: 7 Constitutional Well-nourished and well-hydrated in no acute  distress. Respiratory normal breathing without difficulty. Psychiatric this patient is able to make decisions and demonstrates good insight into disease process. Alert and Oriented x 3. pleasant and cooperative. Notes Upon inspection patient's wound bed actually showed signs of good granulation and epithelization at this point. Fortunately I do not see any signs of active infection locally or systemically which is great and overall I think that we are headed in the right direction I do believe that addition of the clobetasol has done excellent for her. Electronic Signature(s) Signed: 04/14/2022 11:07:42 AM By: Worthy Keeler PA-C Entered By: Worthy Keeler on 04/14/2022 11:07:42 Cisnero, Estill Bakes. ([id number]) -------------------------------------------------------------------------------- Physician Orders Details Patient Name: NAVIL, KOLE. Date of Service: 04/14/2022 10:30 AM Medical Record Number: [id number] Patient Account Number: [id number] Date of Birth/Sex: September 30, 1951 (71 y.o. F) Treating RN: Alycia Rossetti Primary Care Provider: Fulton Reek Other Clinician: Massie Kluver Referring Provider: Fulton Reek Treating Provider/Extender: Skipper Cliche in Treatment: 7 Verbal / Phone Orders: No Diagnosis Coding ICD-10 Coding Code Description L57.0 Actinic keratosis L98.492 Non-pressure chronic ulcer of skin of other sites with fat layer exposed I10 Essential (primary) hypertension I87.2 Venous insufficiency (chronic) (peripheral) F22 Delusional disorders Follow-up Appointments o Return Appointment in 1 week. Medications-Please add to medication list. o Other: - Clobetasol around outer edges of wound Wound Treatment Wound #1 - Head - Parietal Cleanser: Soap and Water 1 x Per Day/30 Days Discharge Instructions: Gently cleanse wound with antibacterial soap, rinse and pat dry prior to dressing wounds Topical: Triple Antibiotic Ointment, 1 (oz) Tube 1 x Per  Day/30 Days Discharge Instructions: applied on wound to prevent sticking Primary Dressing: Hydrofera Blue Ready Transfer Foam, 2.5x2.5 (in/in) (Generic) 1 x Per Day/30 Days Discharge Instructions: Apply Hydrofera Blue Ready to wound bed as directed Secondary Dressing: Gauze 1 x Per  Day/30 Days Discharge Instructions: As directed: dry, moistened with saline or moistened with Dakins Solution Electronic Signature(s) Signed: 04/14/2022 4:26:20 PM By: Worthy Keeler PA-C Signed: 04/14/2022 4:29:51 PM By: Alycia Rossetti Entered By: Alycia Rossetti on 04/14/2022 10:43:25 Smoots, Estill Bakes. ([id number]) -------------------------------------------------------------------------------- Problem List Details Patient Name: MAKAIAH, TERWILLIGER. Date of Service: 04/14/2022 10:30 AM Medical Record Number: [id number] Patient Account Number: [id number] Date of Birth/Sex: 1951-03-11 (71 y.o. F) Treating RN: Cornell Barman Primary Care Provider: Fulton Reek Other Clinician: Massie Kluver Referring Provider: Fulton Reek Treating Provider/Extender: Skipper Cliche in Treatment: 7 Active Problems ICD-10 Encounter Code Description Active Date MDM Diagnosis L57.0 Actinic keratosis 02/21/2022 No Yes L98.492 Non-pressure chronic ulcer of skin of other sites with fat layer exposed 02/21/2022 No Yes I10 Essential (primary) hypertension 02/21/2022 No Yes I87.2 Venous insufficiency (chronic) (peripheral) 02/21/2022 No Yes F22 Delusional disorders 02/21/2022 No Yes Inactive Problems Resolved Problems Electronic Signature(s) Signed: 04/14/2022 10:29:47 AM By: Worthy Keeler PA-C Entered By: Worthy Keeler on 04/14/2022 10:29:47 Demeo, Estill Bakes. ([id number]) -------------------------------------------------------------------------------- Progress Note Details Patient Name: KIERSTON, PLASENCIA. Date of Service: 04/14/2022 10:30 AM Medical Record Number: [id number] Patient Account Number: [id number] Date of  Birth/Sex: 1951/02/25 (71 y.o. F) Treating RN: Cornell Barman Primary Care Provider: Fulton Reek Other Clinician: Massie Kluver Referring Provider: Fulton Reek Treating Provider/Extender: Skipper Cliche in Treatment: 7 Subjective Chief Complaint Information obtained from Patient Scalp Actinic Keratosis History of Present Illness (HPI) 02/21/2022 upon evaluation today patient presents for initial evaluation here in the clinic concerning an issue she has been having with her scalp. This is a biopsy confirmed actinic keratosis which has been managed by Dr. Nehemiah Massed for the past 2 years. The patient tells me currently that he has been performing debridement to clear away some of the scab when it would form. Subsequently he would then put some ointment on it typically mupirocin is what it sounds like was the case according to the patient and subsequently that would seem to do better for time. However it is never really healed up and continues to be an issue which over the past 2 years has caused her significant problems. She is notably frustrated during the office visit today not necessarily with me but just with the process altogether which I can completely understand. She does have a history of hypertension, chronic venous insufficiency, and delusional disorders but otherwise no major medical problems. Subsequently she has not seen any other specialist at [facility] or otherwise as far as dermatology is concerned she has not had any procedures such as laser, cryotherapy, etc. 03-03-2022 upon evaluation today patient appears to be doing well with regard to her wound on the scalp. This is actually healing quite nicely. With that being said she continues to have significant issues here with the actinic keratosis and I am afraid that even if we get this healed it is going to be apt to reopen. I discussed that with her as well. Fortunately I do not see any signs of active infection locally nor  systemically. 03-10-2022 upon evaluation today patient appears to be doing well with regard to her scalp ulceration. Nonetheless this is an actinic keratosis type issue and subsequently she is still having significant issues here with being somewhat friable and bleeding although with the [facility] this is much better than anything we have seen previous. Fortunately I do not see any evidence of active infection locally or systemically which is great news. No fevers, chills, nausea, vomiting, or diarrhea.  03-17-2022 upon evaluation today patient appears to be showing some signs of more hypergranulation in regard to the scalp wound. With that being said I do not see any signs of active infection locally or systemically which is great news. 03-24-2022 upon evaluation today patient's wound actually is showing signs of doing slightly better although honestly not as great as I would like to have seen. She continues to have some significant issues here and to be honest I do believe that she would benefit from continuing with the [facility] though this is still basically just barely managing the situation. 04-07-2022 upon evaluation today patient appears to be doing well with regard to her wound. She has been tolerating the dressing changes without complication. Fortunately there does not appear to be any evidence of infection locally or systemically which is great news and overall I think that we are definitely headed in the right direction. She does have her appointment with dermatology in 2 days. 04-14-2022 upon evaluation today patient appears to be doing well currently in regard to her wound on the scalp. She did see dermatology and they did go through some things with her. 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 They subsequently added a different treatment option into the mix for her putting clobetasol around the edges of the wound and then using Neosporin just over  the surface of the main wound area. With that being said overall this seems to have done quite well for her and things are significantly improved. Fortunately I do not see any evidence of active infection locally nor systemically which is great news and in general she looks to be almost completely healed which is great news as well. Objective Constitutional Well-nourished and well-hydrated in no acute distress. Vitals Time Taken: 10:21 AM, Height: 62 in, Weight: 200 lbs, BMI: 36.6, Temperature: 97.2 F, Pulse: 85 bpm, Respiratory Rate: 16 breaths/min, Blood Pressure: 121/82 mmHg. Respiratory normal breathing without difficulty. [name] ([id number]) Psychiatric this patient is able to make decisions and demonstrates good insight into disease process. Alert and Oriented x 3. pleasant and cooperative. General Notes: Upon inspection patient's wound bed actually showed signs of good granulation and epithelization at this point. Fortunately I do not see any signs of active infection locally or systemically which is great and overall I think that we are headed in the right direction I do believe that addition of the clobetasol has done excellent for her. Integumentary (Hair, Skin) Wound #1 status is Open. Original cause of wound was Gradually Appeared. The date acquired was: 12/15/2018. The wound has been in treatment 7 weeks. The wound is located on the Head - Parietal. The wound measures 1.3cm length x 0.2cm width x 0.1cm depth; 0.204cm^2 area and 0.02cm^3 volume. There is Fat Layer (Subcutaneous Tissue) exposed. There is no tunneling or undermining noted. There is a medium amount of serous drainage noted. There is large (67-100%) red granulation within the wound bed. There is a small (1-33%) amount of necrotic tissue within the wound bed including Adherent Slough. Assessment Active Problems ICD-10 Actinic keratosis Non-pressure chronic ulcer of skin of other sites with fat layer  exposed Essential (primary) hypertension Venous insufficiency (chronic) (peripheral) Delusional disorders Plan Follow-up Appointments: Return Appointment in 1 week. Medications-Please add to medication list.: Other: - Clobetasol around outer edges of wound WOUND #1: - Head - Parietal Wound Laterality: Cleanser: Soap and Water 1 x Per Day/30 Days Discharge Instructions: Gently cleanse wound with antibacterial soap,  rinse and pat dry prior to dressing wounds Topical: Triple Antibiotic Ointment, 1 (oz) Tube 1 x Per Day/30 Days Discharge Instructions: applied on wound to prevent sticking Primary Dressing: Hydrofera Blue Ready Transfer Foam, 2.5x2.5 (in/in) (Generic) 1 x Per Day/30 Days Discharge Instructions: Apply Hydrofera Blue Ready to wound bed as directed Secondary Dressing: Gauze 1 x Per Day/30 Days Discharge Instructions: As directed: dry, moistened with saline or moistened with Dakins Solution 1. I am going to suggest that we go ahead and continue with the wound care measures as before and the patient is in agreement with plan. This includes the use of the Neosporin right over the opening of the wound which is very small at this point. 2. We will continue with clobetasol around the edges of the wound. 3. I am also can recommend Hydrofera Blue over top. 4. I would also suggest that we go ahead and continue with the dressing changes daily as before she is hold this along with her headband. We will see patient back for reevaluation in 1 week here in the clinic. If anything worsens or changes patient will contact our office for additional recommendations. Electronic Signature(s) Signed: 04/14/2022 11:08:19 AM By: Worthy Keeler PA-C Entered By: Worthy Keeler on 04/14/2022 11:08:18 [name] ([id number]) -------------------------------------------------------------------------------- SuperBill Details Patient Name: SHELIAH, FIORILLO. Date of Service: 04/14/2022 Medical  Record Number: [id number] Patient Account Number: [id number] Date of Birth/Sex: 1951/05/29 (71 y.o. F) Treating RN: Cornell Barman Primary Care Provider: Fulton Reek Other Clinician: Massie Kluver Referring Provider: Fulton Reek Treating Provider/Extender: Skipper Cliche in Treatment: 7 Diagnosis Coding ICD-10 Codes Code Description L57.0 Actinic keratosis L98.492 Non-pressure chronic ulcer of skin of other sites with fat layer exposed I10 Essential (primary) hypertension I87.2 Venous insufficiency (chronic) (peripheral) F22 Delusional disorders Facility Procedures CPT4 Code: 13244010 Description: 99213 - WOUND CARE VISIT-LEV 3 EST PT Modifier: Quantity: 1 Physician Procedures CPT4 Code: 2725366 Description: 44034 - WC PHYS LEVEL 3 - EST PT Modifier: Quantity: 1 CPT4 Code: Description: ICD-10 Diagnosis Description L57.0 Actinic keratosis L98.492 Non-pressure chronic ulcer of skin of other sites with fat layer expos I10 Essential (primary) hypertension I87.2 Venous insufficiency (chronic) (peripheral) Modifier: ed Quantity: Electronic Signature(s) Signed: 04/14/2022 11:08:46 AM By: Worthy Keeler PA-C Entered By: Worthy Keeler on 04/14/2022 11:08:46

## 2022-04-23 ENCOUNTER — Ambulatory Visit
Admission: RE | Admit: 2022-04-23 | Discharge: 2022-04-23 | Disposition: A | Discharge status: Home / Self Care | Payer: PPO | Source: Ambulatory Visit | Attending: Oncology | Admitting: Oncology

## 2022-04-23 DIAGNOSIS — Z1231 Encounter for screening mammogram for malignant neoplasm of breast: Secondary | ICD-10-CM | POA: Diagnosis not present

## 2022-04-23 DIAGNOSIS — E890 Postprocedural hypothyroidism: Secondary | ICD-10-CM | POA: Insufficient documentation

## 2022-04-23 DIAGNOSIS — M810 Age-related osteoporosis without current pathological fracture: Secondary | ICD-10-CM | POA: Insufficient documentation

## 2022-04-23 DIAGNOSIS — Z923 Personal history of irradiation: Secondary | ICD-10-CM | POA: Insufficient documentation

## 2022-04-23 DIAGNOSIS — Z780 Asymptomatic menopausal state: Secondary | ICD-10-CM | POA: Insufficient documentation

## 2022-04-23 DIAGNOSIS — Z13820 Encounter for screening for osteoporosis: Secondary | ICD-10-CM | POA: Diagnosis not present

## 2022-04-23 DIAGNOSIS — M85851 Other specified disorders of bone density and structure, right thigh: Secondary | ICD-10-CM | POA: Diagnosis not present

## 2022-04-23 DIAGNOSIS — C50411 Malignant neoplasm of upper-outer quadrant of right female breast: Secondary | ICD-10-CM | POA: Insufficient documentation

## 2022-04-23 DIAGNOSIS — Z853 Personal history of malignant neoplasm of breast: Secondary | ICD-10-CM | POA: Insufficient documentation

## 2022-04-28 ENCOUNTER — Encounter: Payer: PPO | Attending: Physician Assistant | Admitting: Physician Assistant

## 2022-04-28 DIAGNOSIS — I10 Essential (primary) hypertension: Secondary | ICD-10-CM | POA: Insufficient documentation

## 2022-04-28 DIAGNOSIS — I872 Venous insufficiency (chronic) (peripheral): Secondary | ICD-10-CM | POA: Insufficient documentation

## 2022-04-28 DIAGNOSIS — Z86718 Personal history of other venous thrombosis and embolism: Secondary | ICD-10-CM | POA: Insufficient documentation

## 2022-04-28 DIAGNOSIS — L98492 Non-pressure chronic ulcer of skin of other sites with fat layer exposed: Secondary | ICD-10-CM | POA: Insufficient documentation

## 2022-04-28 NOTE — Progress Notes (Signed)
[name] ([id number]) Visit Report for 04/28/2022 Chief Complaint Document Details Patient Name: Susan Myers, [name] Date of Service: 04/28/2022 8:15 AM Medical Record Number: [id number] Patient Account Number: [id number] Date of Birth/Sex: Jul 14, 1951 (71 y.o. F) Treating RN: Carlene Coria Primary Care Provider: Fulton Reek Other Clinician: Massie Kluver Referring Provider: Fulton Reek Treating Provider/Extender: Skipper Cliche in Treatment: 9 Information Obtained from: Patient Chief Complaint Scalp Actinic Keratosis Electronic Signature(s) Signed: 04/28/2022 8:12:35 AM By: Worthy Keeler PA-C Entered By: Worthy Keeler on 04/28/2022 08:12:35 [name] ([id number]) -------------------------------------------------------------------------------- Problem List Details Patient Name: Susan Myers, [name] Date of Service: 04/28/2022 8:15 AM Medical Record Number: [id number] Patient Account Number: [id number] Date of Birth/Sex: 01/11/1951 (71 y.o. F) Treating RN: Carlene Coria Primary Care Provider: Fulton Reek Other Clinician: Massie Kluver Referring Provider: Fulton Reek Treating Provider/Extender: Skipper Cliche in Treatment: 9 Active Problems ICD-10 Encounter Code Description Active Date MDM Diagnosis L57.0 Actinic keratosis 02/21/2022 No Yes L98.492 Non-pressure chronic ulcer of skin of other sites with fat layer exposed 02/21/2022 No Yes I10 Essential (primary) hypertension 02/21/2022 No Yes I87.2 Venous insufficiency (chronic) (peripheral) 02/21/2022 No Yes F22 Delusional disorders 02/21/2022 No Yes Inactive Problems Resolved Problems Electronic Signature(s) Signed: 04/28/2022 8:12:30 AM By: Worthy Keeler PA-C Entered By: Worthy Keeler on 04/28/2022 08:12:30

## 2022-04-29 NOTE — Progress Notes (Signed)
[name] ([id number]) Visit Report for 04/28/2022 Arrival Information Details Patient Name: PRIM, MORACE. Date of Service: 04/28/2022 8:15 AM Medical Record Number: [id number] Patient Account Number: [id number] Date of Birth/Sex: February 23, 1951 (71 y.o. F) Treating RN: Carlene Coria Primary Care Chesley Veasey: Fulton Reek Other Clinician: Massie Kluver Referring Avis Tirone: Fulton Reek Treating Cera Rorke/Extender: Skipper Cliche in Treatment: 9 Visit Information History Since Last Visit Added or deleted any medications: No Patient Arrived: Ambulatory Any new allergies or adverse reactions: No Arrival Time: 08:08 Had a fall or experienced change in No Transfer Assistance: None activities of daily living that may affect Patient Requires Transmission-Based No risk of falls: Precautions: Hospitalized since last visit: No Patient Has Alerts: Yes Pain Present Now: No Patient Alerts: Patient on Blood Thinner Electronic Signature(s) Signed: 04/29/2022 4:20:44 PM By: Massie Kluver Entered By: Massie Kluver on 04/28/2022 08:08:52 [name] ([id number]) -------------------------------------------------------------------------------- Clinic Level of Care Assessment Details Patient Name: ANNALEI, FRIESZ. Date of Service: 04/28/2022 8:15 AM Medical Record Number: [id number] Patient Account Number: [id number] Date of Birth/Sex: [birth year] (71 y.o. F) Treating RN: Carlene Coria Primary Care Kierstin January: Fulton Reek Other Clinician: Massie Kluver Referring Maty Zeisler: Fulton Reek Treating Andreika Vandagriff/Extender: Skipper Cliche in Treatment: 9 Clinic Level of Care Assessment Items TOOL 4 Quantity Score '[]'$  - Use when only an EandM is performed on FOLLOW-UP visit 0 ASSESSMENTS - Nursing Assessment / Reassessment X - Reassessment of Co-morbidities (includes updates in patient status) 1 10 X- 1 5 Reassessment of Adherence to Treatment Plan ASSESSMENTS - Wound and  Skin Assessment / Reassessment X - Simple Wound Assessment / Reassessment - one wound 1 5 '[]'$  - 0 Complex Wound Assessment / Reassessment - multiple wounds '[]'$  - 0 Dermatologic / Skin Assessment (not related to wound area) ASSESSMENTS - Focused Assessment '[]'$  - Circumferential Edema Measurements - multi extremities 0 '[]'$  - 0 Nutritional Assessment / Counseling / Intervention '[]'$  - 0 Lower Extremity Assessment (monofilament, tuning fork, pulses) '[]'$  - 0 Peripheral Arterial Disease Assessment (using hand held doppler) ASSESSMENTS - Ostomy and/or Continence Assessment and Care '[]'$  - Incontinence Assessment and Management 0 '[]'$  - 0 Ostomy Care Assessment and Management (repouching, etc.) PROCESS - Coordination of Care X - Simple Patient / Family Education for ongoing care 1 15 '[]'$  - 0 Complex (extensive) Patient / Family Education for ongoing care '[]'$  - 0 Staff obtains Programmer, systems, Records, Test Results / Process Orders '[]'$  - 0 Staff telephones HHA, Nursing Homes / Clarify orders / etc '[]'$  - 0 Routine Transfer to another Facility (non-emergent condition) '[]'$  - 0 Routine Hospital Admission (non-emergent condition) '[]'$  - 0 New Admissions / Biomedical engineer / Ordering NPWT, Apligraf, etc. '[]'$  - 0 Emergency Hospital Admission (emergent condition) X- 1 10 Simple Discharge Coordination '[]'$  - 0 Complex (extensive) Discharge Coordination PROCESS - Special Needs '[]'$  - Pediatric / Minor Patient Management 0 '[]'$  - 0 Isolation Patient Management '[]'$  - 0 Hearing / Language / Visual special needs '[]'$  - 0 Assessment of Community assistance (transportation, D/C planning, etc.) '[]'$  - 0 Additional assistance / Altered mentation '[]'$  - 0 Support Surface(s) Assessment (bed, cushion, seat, etc.) INTERVENTIONS - Wound Cleansing / Measurement Riches, Estill Bakes. ([id number]) X- 1 5 Simple Wound Cleansing - one wound '[]'$  - 0 Complex Wound Cleansing - multiple wounds X- 1 5 Wound Imaging (photographs - any  number of wounds) '[]'$  - 0 Wound Tracing (instead of photographs) X- 1 5 Simple Wound Measurement - one wound '[]'$  - 0 Complex Wound Measurement - multiple  wounds INTERVENTIONS - Wound Dressings X - Small Wound Dressing one or multiple wounds 1 10 '[]'$  - 0 Medium Wound Dressing one or multiple wounds '[]'$  - 0 Large Wound Dressing one or multiple wounds '[]'$  - 0 Application of Medications - topical '[]'$  - 0 Application of Medications - injection INTERVENTIONS - Miscellaneous '[]'$  - External ear exam 0 '[]'$  - 0 Specimen Collection (cultures, biopsies, blood, body fluids, etc.) '[]'$  - 0 Specimen(s) / Culture(s) sent or taken to Lab for analysis '[]'$  - 0 Patient Transfer (multiple staff / Civil Service fast streamer / Similar devices) '[]'$  - 0 Simple Staple / Suture removal (25 or less) '[]'$  - 0 Complex Staple / Suture removal (26 or more) '[]'$  - 0 Hypo / Hyperglycemic Management (close monitor of Blood Glucose) '[]'$  - 0 Ankle / Brachial Index (ABI) - do not check if billed separately X- 1 5 Vital Signs Has the patient been seen at the hospital within the last three years: Yes Total Score: 75 Level Of Care: New/Established - Level 2 Electronic Signature(s) Signed: 04/28/2022 5:11:25 PM By: Carlene Coria RN Entered By: Carlene Coria on 04/28/2022 17:01:58 [name] ([id number]) -------------------------------------------------------------------------------- Encounter Discharge Information Details Patient Name: SAIDAH, KEMPTON. Date of Service: 04/28/2022 8:15 AM Medical Record Number: [id number] Patient Account Number: [id number] Date of Birth/Sex: 07-27-1951 (71 y.o. F) Treating RN: Carlene Coria Primary Care Anjelina Dung: Fulton Reek Other Clinician: Massie Kluver Referring Alasha Mcguinness: Fulton Reek Treating Devondre Guzzetta/Extender: Skipper Cliche in Treatment: 9 Encounter Discharge Information Items Discharge Condition: Stable Ambulatory Status: Ambulatory Discharge Destination: Home Transportation:  Private Auto Accompanied By: husband Schedule Follow-up Appointment: Yes Clinical Summary of Care: Electronic Signature(s) Signed: 04/29/2022 4:20:44 PM By: Massie Kluver Entered By: Massie Kluver on 04/28/2022 08:32:11 [name] ([id number]) -------------------------------------------------------------------------------- Lower Extremity Assessment Details Patient Name: GABREILLE, DARDIS. Date of Service: 04/28/2022 8:15 AM Medical Record Number: [id number] Patient Account Number: [id number] Date of Birth/Sex: 10-11-1951 (71 y.o. F) Treating RN: Carlene Coria Primary Care Maysen Sudol: Fulton Reek Other Clinician: Massie Kluver Referring Darreld Hoffer: Fulton Reek Treating Teigen Bellin/Extender: Skipper Cliche in Treatment: 9 Electronic Signature(s) Signed: 04/28/2022 8:26:59 AM By: Carlene Coria RN Signed: 04/29/2022 4:20:44 PM By: Massie Kluver Entered By: Massie Kluver on 04/28/2022 08:16:56 [place], Estill Bakes. ([id number]) -------------------------------------------------------------------------------- Multi Wound Chart Details Patient Name: JEANITA, CARNEIRO. Date of Service: 04/28/2022 8:15 AM Medical Record Number: [id number] Patient Account Number: [id number] Date of Birth/Sex: 1951-07-16 (71 y.o. F) Treating RN: Carlene Coria Primary Care Tametra Ahart: Fulton Reek Other Clinician: Massie Kluver Referring Fitz Matsuo: Fulton Reek Treating Dareen Gutzwiller/Extender: Skipper Cliche in Treatment: 9 Vital Signs Height(in): 62 Pulse(bpm): 57 Weight(lbs): 200 Blood Pressure(mmHg): 142/87 Body Mass Index(BMI): 36.6 Temperature(F): 98.6 Respiratory Rate(breaths/min): 16 Photos: [N/A:N/A] Wound Location: Head - Parietal N/A N/A Wounding Event: Gradually Appeared N/A N/A Primary Etiology: Atypical N/A N/A Comorbid History: Deep Vein Thrombosis, N/A N/A Hypertension, Vasculitis Date Acquired: 12/15/2018 N/A N/A Weeks of Treatment: 9 N/A N/A Wound Status: Open N/A  N/A Wound Recurrence: No N/A N/A Measurements L x W x D (cm) 0x0x0 N/A N/A Area (cm) : 0 N/A N/A Volume (cm) : 0 N/A N/A % Reduction in Area: 100.00% N/A N/A % Reduction in Volume: 100.00% N/A N/A Classification: Full Thickness Without Exposed N/A N/A Support Structures Exudate Amount: Small N/A N/A Exudate Type: Serous N/A N/A Exudate Color: amber N/A N/A Granulation Amount: Large (67-100%) N/A N/A Granulation Quality: Red N/A N/A Necrotic Amount: Small (1-33%) N/A N/A Exposed Structures: Fat Layer (Subcutaneous Tissue): N/A N/A Yes Fascia: No  Tendon: No Muscle: No Joint: No Bone: No Epithelialization: Large (67-100%) N/A N/A Treatment Notes Electronic Signature(s) Signed: 04/29/2022 4:20:44 PM By: Massie Kluver Entered By: Massie Kluver on 04/28/2022 08:17:05 [place], Estill Bakes. ([id number]) -------------------------------------------------------------------------------- Noonday Details Patient Name: DALAYZA, ZAMBRANA. Date of Service: 04/28/2022 8:15 AM Medical Record Number: [id number] Patient Account Number: [id number] Date of Birth/Sex: [birth year] (71 y.o. F) Treating RN: Carlene Coria Primary Care Natalynn Pedone: Fulton Reek Other Clinician: Massie Kluver Referring Muzammil Bruins: Fulton Reek Treating Ardyth Kelso/Extender: Skipper Cliche in Treatment: 9 Active Inactive Electronic Signature(s) Signed: 04/28/2022 8:26:59 AM By: Carlene Coria RN Signed: 04/29/2022 4:20:44 PM By: Massie Kluver Entered By: Massie Kluver on 04/28/2022 08:16:59 [name] ([id number]) -------------------------------------------------------------------------------- Pain Assessment Details Patient Name: KATHERLEEN, FOLKES. Date of Service: 04/28/2022 8:15 AM Medical Record Number: [id number] Patient Account Number: [id number] Date of Birth/Sex: Oct 26, 1951 (71 y.o. F) Treating RN: Carlene Coria Primary Care Perrion Diesel: Fulton Reek Other Clinician: Massie Kluver Referring Zainab Crumrine: Fulton Reek Treating Statia Burdick/Extender: Skipper Cliche in Treatment: 9 Active Problems Location of Pain Severity and Description of Pain Patient Has Paino No Site Locations Pain Management and Medication Current Pain Management: Electronic Signature(s) Signed: 04/28/2022 8:26:59 AM By: Carlene Coria RN Signed: 04/29/2022 4:20:44 PM By: Massie Kluver Entered By: Massie Kluver on 04/28/2022 08:12:19 [name] ([id number]) -------------------------------------------------------------------------------- Patient/Caregiver Education Details Patient Name: SOCORRO, KANITZ. Date of Service: 04/28/2022 8:15 AM Medical Record Number: [id number] Patient Account Number: [id number] Date of Birth/Gender: August 25, 1951 (71 y.o. F) Treating RN: Carlene Coria Primary Care Physician: Fulton Reek Other Clinician: Massie Kluver Referring Physician: Fulton Reek Treating Physician/Extender: Skipper Cliche in Treatment: 9 Education Assessment Education Provided To: Patient Education Topics Provided Wound/Skin Impairment: Handouts: Other: continue wound care treatment as directed Electronic Signature(s) Signed: 04/29/2022 4:20:44 PM By: Massie Kluver Entered By: Massie Kluver on 04/28/2022 08:31:30 [place], Estill Bakes. ([id number]) -------------------------------------------------------------------------------- Wound Assessment Details Patient Name: LUZELENA, HEEG. Date of Service: 04/28/2022 8:15 AM Medical Record Number: [id number] Patient Account Number: [id number] Date of Birth/Sex: [birth year] (71 y.o. F) Treating RN: Carlene Coria Primary Care Tyrika Newman: Fulton Reek Other Clinician: Massie Kluver Referring Brennyn Haisley: Fulton Reek Treating Gaylynn Seiple/Extender: Skipper Cliche in Treatment: 9 Wound Status Wound Number: 1 Primary Etiology: Atypical Wound Location: Head - Parietal Wound Status: Open Wounding Event: Gradually  Appeared Comorbid History: Deep Vein Thrombosis, Hypertension, Vasculitis Date Acquired: 12/15/2018 Weeks Of Treatment: 9 Clustered Wound: No Photos Wound Measurements Length: (cm) 0 Width: (cm) 0 Depth: (cm) 0 Area: (cm) Volume: (cm) % Reduction in Area: 100% % Reduction in Volume: 100% Epithelialization: Large (67-100%) 0 Tunneling: No 0 Undermining: No Wound Description Classification: Full Thickness Without Exposed Support Structu Exudate Amount: Small Exudate Type: Serous Exudate Color: amber res Foul Odor After Cleansing: No Slough/Fibrino No Wound Bed Granulation Amount: Large (67-100%) Exposed Structure Granulation Quality: Red Fascia Exposed: No Necrotic Amount: Small (1-33%) Fat Layer (Subcutaneous Tissue) Exposed: Yes Necrotic Quality: Adherent Slough Tendon Exposed: No Muscle Exposed: No Joint Exposed: No Bone Exposed: No Electronic Signature(s) Signed: 04/28/2022 8:26:59 AM By: Carlene Coria RN Signed: 04/29/2022 4:20:44 PM By: Massie Kluver Entered By: Massie Kluver on 04/28/2022 08:16:43 [name] ([id number]) -------------------------------------------------------------------------------- Vitals Details Patient Name: CHELCEY, CAPUTO. Date of Service: 04/28/2022 8:15 AM Medical Record Number: [id number] Patient Account Number: [id number] Date of Birth/Sex: 07-Apr-1951 (71 y.o. F) Treating RN: Carlene Coria Primary Care Lam Mccubbins: Fulton Reek Other Clinician: Massie Kluver Referring Tailor Lucking: Fulton Reek Treating Arilla Hice/Extender: Skipper Cliche in Treatment: 9 Vital Signs  Time Taken: 08:08 Temperature (F): 98.6 Height (in): 62 Pulse (bpm): 81 Weight (lbs): 200 Respiratory Rate (breaths/min): 16 Body Mass Index (BMI): 36.6 Blood Pressure (mmHg): 142/87 Reference Range: 80 - 120 mg / dl Electronic Signature(s) Signed: 04/29/2022 4:20:44 PM By: Massie Kluver Entered By: Massie Kluver on 04/28/2022 08:12:12

## 2022-05-09 NOTE — Progress Notes (Unsigned)
[facility]  Telephone:([phone]) [phone] Fax:(336) [phone]  ID: Susan Myers OB: [birth year]  MR#: [id number]  IHQ#:[id number]  Patient Care Team: Marguarite Arbour, MD as PCP - General (Internal Medicine) Jeralyn Ruths, MD as Consulting Physician (Oncology)  I connected with Susan Myers on 05/13/22 at  2:45 PM EDT by video enabled telemedicine visit and verified that I am speaking with the correct person using two identifiers.   I discussed the limitations, risks, security and privacy concerns of performing an evaluation and management service by telemedicine and the availability of in-person appointments. I also discussed with the patient that there may be a patient responsible charge related to this service. The patient expressed understanding and agreed to proceed.   Other persons participating in the visit and their role in the encounter: Patient, MD.  Patient's location: Home. Provider's location: Clinic.   CHIEF COMPLAINT: Clinical stage IA ER/PR positive, HER-2 negative invasive carcinoma of the upper outer quadrant of the right breast.  INTERVAL HISTORY: Patient agreed to video assisted telemedicine visit for routine 53-month evaluation.  She continues to feel well and remains asymptomatic.  She continues to tolerate letrozole without significant side effects.  She has no neurologic complaints.  She denies any recent fevers or illnesses.  She has a good appetite and denies weight loss.  She denies any chest pain, shortness of breath, cough, or hemoptysis.  She denies any nausea, vomiting, constipation, or diarrhea.  She has no urinary complaints.  Patient offers no specific complaints today.  REVIEW OF SYSTEMS:   Review of Systems  Constitutional: Negative.  Negative for fever, malaise/fatigue and weight loss.  Respiratory: Negative.  Negative for cough and shortness of breath.   Cardiovascular: Negative.  Negative for chest pain and leg  swelling.  Gastrointestinal: Negative.  Negative for abdominal pain.  Genitourinary: Negative.  Negative for dysuria.  Musculoskeletal: Negative.  Negative for back pain.  Skin: Negative.  Negative for rash.  Neurological: Negative.  Negative for sensory change, focal weakness and weakness.  Psychiatric/Behavioral: Negative.  The patient is not nervous/anxious.     As per HPI. Otherwise, a complete review of systems is negative.  PAST MEDICAL HISTORY: Past Medical History:  Diagnosis Date   Actinic keratosis 01/10/2019   mid vertex scalp   Breast cancer (HCC) 2019   invasive mammary carcinoma, lumpectomy and rad tx   Complication of anesthesia    Delusional disorder (HCC)    DVT (deep venous thrombosis) (HCC) 06/2016   Dysplastic nevus 07/24/2020   R med calf - mod   GERD (gastroesophageal reflux disease)    History of kidney stones    Hx of blood clots    Hypercholesterolemia    Hypothyroidism    Kidney stones    Personal history of radiation therapy 2019   right breast cancer   PONV (postoperative nausea and vomiting)    Thyroid disease     PAST SURGICAL HISTORY: Past Surgical History:  Procedure Laterality Date   ABDOMINAL HYSTERECTOMY     APPENDECTOMY     BREAST BIOPSY Right 03/23/2018   Affirm Bx- invasive mammary carcinoma   BREAST CYST ASPIRATION Bilateral 1990   BREAST LUMPECTOMY Right 2019   invasive mammary carcinoma, clear surgical margins   COLONOSCOPY     IVC FILTER INSERTION     PARTIAL MASTECTOMY WITH NEEDLE LOCALIZATION Right 04/01/2018   Procedure: PARTIAL MASTECTOMY WITH NEEDLE LOCALIZATION;  Surgeon: Carolan Shiver, MD;  Location: [facility];  Service: General;  Laterality: Right;   PERIPHERAL VASCULAR CATHETERIZATION N/A 08/06/2016   Procedure: IVC Filter Insertion;  Surgeon: Katha Cabal, MD;  Location: [facility];  Service: Cardiovascular;  Laterality: N/A;   PERIPHERAL VASCULAR CATHETERIZATION Left 08/06/2016   Procedure:  Lower Extremity Venography;  Surgeon: Katha Cabal, MD;  Location: [place] [facility];  Service: Cardiovascular;  Laterality: Left;   PERIPHERAL VASCULAR CATHETERIZATION  08/06/2016   Procedure: Lower Extremity Intervention;  Surgeon: Katha Cabal, MD;  Location: [place] [facility];  Service: Cardiovascular;;   PERIPHERAL VASCULAR CATHETERIZATION N/A 12/16/2016   Procedure: IVC Filter Removal;  Surgeon: Katha Cabal, MD;  Location: [place] [facility];  Service: Cardiovascular;  Laterality: N/A;   SENTINEL NODE BIOPSY Right 04/01/2018   Procedure: SENTINEL NODE BIOPSY;  Surgeon: Herbert Pun, MD;  Location: [facility];  Service: General;  Laterality: Right;   TONSILLECTOMY      FAMILY HISTORY: Family History  Problem Relation Age of Onset   Breast cancer Neg Hx    Prostate cancer Neg Hx    Kidney cancer Neg Hx     ADVANCED DIRECTIVES (Y/N):  N  HEALTH MAINTENANCE: Social History   Tobacco Use   Smoking status: Never   Smokeless tobacco: Never  Vaping Use   Vaping Use: Never used  Substance Use Topics   Alcohol use: No   Drug use: No     Colonoscopy:  PAP:  Bone density:  Lipid panel:  Allergies  Allergen Reactions   Levaquin [Levofloxacin In D5w] Hives   Macrolides And Ketolides Other (See Comments)    WHELPS WHELPS WHELPS   Penicillins Itching    Can take amoxicillin  Has patient had a PCN reaction causing immediate rash, facial/tongue/throat swelling, SOB or lightheadedness with hypotension:No Has patient had a PCN reaction causing severe rash involving mucus membranes or skin necrosis:unsure Has patient had a PCN reaction that required hospitalization:No Has patient had a PCN reaction occurring within the last 10 years: No If all of the above answers are "NO", then may proceed with Cephalosporin use.   Sulfa Antibiotics Hives    Current Outpatient Medications  Medication Sig Dispense Refill   acetaminophen (TYLENOL) 500 MG tablet Take  500 mg by mouth every 6 (six) hours as needed for moderate pain or headache.     alendronate (FOSAMAX) 70 MG tablet Take 1 tablet (70 mg total) by mouth once a week. Take with a full glass of water on an empty stomach. 12 tablet 3   Cholecalciferol (VITAMIN D PO) Take 1 tablet by mouth once a week.     hydrochlorothiazide (HYDRODIURIL) 25 MG tablet Take 25 mg by mouth daily.     levothyroxine (SYNTHROID) 125 MCG tablet Take 125 mcg by mouth daily.     meloxicam (MOBIC) 7.5 MG tablet Take 7.5 mg by mouth daily as needed for pain.     risperiDONE (RISPERDAL) 1 MG tablet Take 1 mg by mouth at bedtime.     rivaroxaban (XARELTO) 20 MG TABS tablet Take 20 mg by mouth daily.     simvastatin (ZOCOR) 20 MG tablet Take 20 mg by mouth every evening.      letrozole (FEMARA) 2.5 MG tablet Take 1 tablet (2.5 mg total) by mouth daily. 90 tablet 3   neomycin-polymyxin-hydrocortisone (CORTISPORIN) 3.5-10000-1 OTIC suspension Apply 1-2 drops daily after soaking and cover with bandaid (Patient not taking: Reported on 11/06/2021) 10 mL 0   phenylephrine (SUDAFED PE) 10 MG TABS tablet  Take 10 mg by mouth every 6 (six) hours as needed (congestion). (Patient not taking: Reported on 11/06/2021)     No current facility-administered medications for this visit.    OBJECTIVE: There were no vitals filed for this visit.   There is no height or weight on file to calculate BMI.    ECOG FS:0 - Asymptomatic  General: Well-developed, well-nourished, no acute distress. HEENT: Normocephalic. Neuro: Alert, answering all questions appropriately. Cranial nerves grossly intact. Psych: Normal affect.  LAB RESULTS:  Lab Results  Component Value Date   NA 143 11/23/2015   K 3.4 (L) 11/23/2015   CL 105 11/23/2015   CO2 28 11/23/2015   GLUCOSE 101 (H) 11/23/2015   BUN 23 (H) 08/06/2016   CREATININE 0.88 08/06/2016   CALCIUM 9.7 11/23/2015   PROT 7.5 11/23/2015   ALBUMIN 4.8 11/23/2015   AST 22 11/23/2015   ALT 24  11/23/2015   ALKPHOS 88 11/23/2015   BILITOT 0.8 11/23/2015   GFRNONAA >60 08/06/2016   GFRAA >60 08/06/2016    Lab Results  Component Value Date   WBC 5.5 06/15/2018   HGB 14.5 06/15/2018   HCT 42.7 06/15/2018   MCV 93.7 06/15/2018   PLT 168 06/15/2018     STUDIES: MM 3D SCREEN BREAST BILATERAL  Result Date: 04/24/2022 CLINICAL DATA:  Screening. EXAM: DIGITAL SCREENING BILATERAL MAMMOGRAM WITH TOMOSYNTHESIS AND CAD TECHNIQUE: Bilateral screening digital craniocaudal and mediolateral oblique mammograms were obtained. Bilateral screening digital breast tomosynthesis was performed. The images were evaluated with computer-aided detection. COMPARISON:  Previous exam(s). ACR Breast Density Category c: The breast tissue is heterogeneously dense, which may obscure small masses. FINDINGS: There are no findings suspicious for malignancy. IMPRESSION: No mammographic evidence of malignancy. A result letter of this screening mammogram will be mailed directly to the patient. RECOMMENDATION: Screening mammogram in one year. (Code:SM-B-01Y) BI-RADS CATEGORY  1: Negative. Electronically Signed   By: Ammie Ferrier M.D.   On: 04/24/2022 11:42   DG Bone Density  Result Date: 04/23/2022 EXAM: DUAL X-RAY ABSORPTIOMETRY (DXA) FOR BONE MINERAL DENSITY IMPRESSION: Your patient Jahdai Padovano completed a BMD test on 04/23/2022 using the [employer] (software version: 14.10) manufactured by [employer]. The following summarizes the results of our evaluation. Technologist: [facility] PATIENT BIOGRAPHICAL: Name: Terrica, Duecker Patient ID: [id number] Birth Date: [birth year] Height: 62.0 in. Gender: Female Exam Date: 04/23/2022 Weight: 201.6 lbs. Indications: Advanced Age, Breast CA, Caucasian, Height Loss, High Risk Meds, History of Breast Cancer, History of Fracture (Adult), History of Radiation, Hypothyroid, Hysterectomy, Oophorectomy Bilateral, Postmenopausal Fractures: Left foot Treatments:  Omeprazole, Vitamin D, Calcium, Levothyroxine, Femara, Letrozole DENSITOMETRY RESULTS: Site          Region     Measured Date Measured Age WHO Classification Young Adult T-score BMD         %Change vs. Previous Significant Change (*) DualFemur Neck Right 04/23/2022 71.5 Osteopenia -2.1 0.740 g/cm2 -0.4% - DualFemur Neck Right 07/18/2020 69.8 Osteopenia -2.1 0.743 g/cm2 -5.7% - DualFemur Neck Right 07/14/2019 68.8 Osteopenia -1.8 0.788 g/cm2 -1.6% - DualFemur Neck Right 07/12/2018 67.8 Osteopenia -1.7 0.801 g/cm2 - - DualFemur Total Mean 04/23/2022 71.5 Osteopenia -1.5 0.824 g/cm2 -2.3% Yes DualFemur Total Mean 07/18/2020 69.8 Osteopenia -1.3 0.843 g/cm2 -2.7% Yes DualFemur Total Mean 07/14/2019 68.8 Osteopenia -1.1 0.866 g/cm2 -2.5% Yes DualFemur Total Mean 07/12/2018 67.8 Normal -1.0 0.888 g/cm2 - - Right Forearm Radius 33% 04/23/2022 71.5 Osteoporosis -2.6 0.648 g/cm2 -20.2% Yes Right Forearm Radius 33% 07/14/2019 68.8  Normal -0.7 0.812 g/cm2 1.1% - Right Forearm Radius 33% 07/12/2018 67.8 Normal -0.8 0.803 g/cm2 - - ASSESSMENT: The BMD measured at Forearm Radius 33% is 0.648 g/cm2 with a T-score of -2.6. This patient is considered osteoporotic according to [facility]) criteria. The scan quality is good. Lumbar spine was not utilized due to advanced degenerative changes. Compared with prior study, there has been a significant decrease in the total hip. [facility]) criteria for post-menopausal, Caucasian Women: Normal:                   T-score at or above -1 SD Osteopenia/low bone mass: T-score between -1 and -2.5 SD Osteoporosis:             T-score at or below -2.5 SD RECOMMENDATIONS: 1. All patients should optimize calcium and vitamin D intake. 2. Consider FDA-approved medical therapies in postmenopausal women and men aged 10 years and older, based on the following: a. A hip or vertebral(clinical or morphometric) fracture b. T-score < -2.5 at the femoral neck or spine  after appropriate evaluation to exclude secondary causes c. Low bone mass (T-score between -1.0 and -2.5 at the femoral neck or spine) and a 10-year probability of a hip fracture > 3% or a 10-year probability of a major osteoporosis-related fracture > 20% based on the [place]-adapted WHO algorithm 3. Clinician judgment and/or patient preferences may indicate treatment for people with 10-year fracture probabilities above or below these levels FOLLOW-UP: People with diagnosed cases of osteoporosis or at high risk for fracture should have regular bone mineral density tests. For patients eligible for Medicare, routine testing is allowed once every 2 years. The testing frequency can be increased to one year for patients who have rapidly progressing disease, those who are receiving or discontinuing medical therapy to restore bone mass, or have additional risk factors. I have reviewed this report, and agree with the above findings. [facility] Electronically Signed   By: Zerita Boers M.D.   On: 04/23/2022 09:33    ASSESSMENT: Clinical stage IA ER/PR positive, HER-2 negative invasive carcinoma of the upper outer quadrant of the right breast.  Oncotype DX score 6, low risk.  PLAN:    1. Clinical stage IA ER/PR positive, HER-2 negative invasive carcinoma of the upper outer quadrant of the right breast: Patient underwent lumpectomy on Apr 01, 2018 confirming the stage of disease.  She had an Oncotype DX score of 6 which confers a low risk therefore patient did not require adjuvant chemotherapy.  She completed adjuvant XRT in approximately August 2019.  Continue letrozole for a total of 5 years completing in August 2024.  Her most recent mammogram on Apr 23, 2022 was reported as BI-RADS 2.  Repeat in June 2024.  Return to clinic in 6 months with video assisted telemedicine visit.   2.  Osteopenia: Bone mineral density on Apr 23, 2022 reported T score of -2.6 which is significantly reduced from 1 year prior  where her T score was reported -1.8.  Patient was given a prescription for Fosamax today.  Have also recommended she continue calcium and vitamin D supplementation.  Repeat in June 2024.  Return to clinic in 6 months as above.    I provided 20 minutes of face-to-face video visit time during this encounter which included chart review, counseling, and coordination of care as documented above.   Patient expressed understanding and was in agreement with this plan. She also understands that She can  call clinic at any time with any questions, concerns, or complaints.    Cancer Staging  Primary cancer of upper outer quadrant of right female breast [facility]) Staging form: Breast, AJCC 8th Edition - Clinical stage from 03/26/2018: Stage IA (cT1b, cN0, cM0, G2, ER+, PR+, HER2-) - Signed by Lloyd Huger, MD on 03/26/2018 Histologic grading system: 3 grade system Laterality: Right   Lloyd Huger, MD   05/13/2022 4:29 PM

## 2022-05-13 ENCOUNTER — Encounter: Payer: Self-pay | Admitting: Oncology

## 2022-05-13 ENCOUNTER — Inpatient Hospital Stay: Payer: PPO | Attending: Oncology | Admitting: Oncology

## 2022-05-13 DIAGNOSIS — C50911 Malignant neoplasm of unspecified site of right female breast: Secondary | ICD-10-CM | POA: Diagnosis not present

## 2022-05-13 DIAGNOSIS — C50411 Malignant neoplasm of upper-outer quadrant of right female breast: Secondary | ICD-10-CM

## 2022-05-13 MED ORDER — LETROZOLE 2.5 MG PO TABS: 2.5000 mg | ORAL_TABLET | Freq: Every day | ORAL | 3 refills | Status: DC

## 2022-05-13 MED ORDER — ALENDRONATE SODIUM 70 MG PO TABS: 70.0000 mg | ORAL_TABLET | ORAL | 3 refills | Status: DC

## 2022-05-21 DIAGNOSIS — L821 Other seborrheic keratosis: Secondary | ICD-10-CM | POA: Diagnosis not present

## 2022-05-21 DIAGNOSIS — L988 Other specified disorders of the skin and subcutaneous tissue: Secondary | ICD-10-CM | POA: Diagnosis not present

## 2022-05-21 DIAGNOSIS — D229 Melanocytic nevi, unspecified: Secondary | ICD-10-CM | POA: Diagnosis not present

## 2022-05-21 DIAGNOSIS — L570 Actinic keratosis: Secondary | ICD-10-CM | POA: Diagnosis not present

## 2022-05-26 ENCOUNTER — Encounter: Payer: PPO | Attending: Physician Assistant | Admitting: Physician Assistant

## 2022-05-26 DIAGNOSIS — L089 Local infection of the skin and subcutaneous tissue, unspecified: Secondary | ICD-10-CM | POA: Diagnosis not present

## 2022-05-26 DIAGNOSIS — I10 Essential (primary) hypertension: Secondary | ICD-10-CM | POA: Diagnosis not present

## 2022-05-26 DIAGNOSIS — Z872 Personal history of diseases of the skin and subcutaneous tissue: Secondary | ICD-10-CM | POA: Insufficient documentation

## 2022-05-26 DIAGNOSIS — F22 Delusional disorders: Secondary | ICD-10-CM | POA: Diagnosis not present

## 2022-05-26 DIAGNOSIS — Z09 Encounter for follow-up examination after completed treatment for conditions other than malignant neoplasm: Secondary | ICD-10-CM | POA: Diagnosis not present

## 2022-05-26 DIAGNOSIS — I872 Venous insufficiency (chronic) (peripheral): Secondary | ICD-10-CM | POA: Diagnosis not present

## 2022-05-26 DIAGNOSIS — L98492 Non-pressure chronic ulcer of skin of other sites with fat layer exposed: Secondary | ICD-10-CM | POA: Diagnosis not present

## 2022-05-26 DIAGNOSIS — L570 Actinic keratosis: Secondary | ICD-10-CM | POA: Diagnosis not present

## 2022-05-26 NOTE — Progress Notes (Signed)
[name] ([id number]) Visit Report for 05/26/2022 Arrival Information Details Patient Name: [name], [name] Date of Service: 05/26/2022 8:15 AM Medical Record Number: [id number] Patient Account Number: [id number] Date of Birth/Sex: 07-Jul-1951 (71 y.o. F) Treating RN: Cornell Barman Primary Care Ziare Cryder: Fulton Reek Other Clinician: Referring Elenora Hawbaker: Fulton Reek Treating Mckynlie Vanderslice/Extender: Skipper Cliche in Treatment: 43 Visit Information History Since Last Visit Added or deleted any medications: Yes Patient Arrived: Ambulatory Has Dressing in Place as Prescribed: No Arrival Time: 08:14 Pain Present Now: No Accompanied By: husband Transfer Assistance: None Patient Identification Verified: Yes Secondary Verification Process Completed: Yes Patient Requires Transmission-Based No Precautions: Patient Has Alerts: Yes Patient Alerts: Patient on Blood Thinner Electronic Signature(s) Signed: 05/26/2022 2:38:00 PM By: Gretta Cool, BSN, RN, CWS, Kim RN, BSN Entered By: Gretta Cool, BSN, RN, CWS, Kim on 05/26/2022 08:14:34 [name] ([id number]) -------------------------------------------------------------------------------- Clinic Level of Care Assessment Details Patient Name: Susan Myers, [name] Date of Service: 05/26/2022 8:15 AM Medical Record Number: [id number] Patient Account Number: [id number] Date of Birth/Sex: 02-Oct-1951 (71 y.o. F) Treating RN: Cornell Barman Primary Care Sama Arauz: Fulton Reek Other Clinician: Referring Annalyce Lanpher: Fulton Reek Treating Brentley Horrell/Extender: Skipper Cliche in Treatment: 13 Clinic Level of Care Assessment Items TOOL 4 Quantity Score '[]'$  - Use when only an EandM is performed on FOLLOW-UP visit 0 ASSESSMENTS - Nursing Assessment / Reassessment X - Reassessment of Co-morbidities (includes updates in patient status) 1 10 X- 1 5 Reassessment of Adherence to Treatment Plan ASSESSMENTS - Wound and Skin Assessment /  Reassessment X - Simple Wound Assessment / Reassessment - one wound 1 5 '[]'$  - 0 Complex Wound Assessment / Reassessment - multiple wounds '[]'$  - 0 Dermatologic / Skin Assessment (not related to wound area) ASSESSMENTS - Focused Assessment '[]'$  - Circumferential Edema Measurements - multi extremities 0 '[]'$  - 0 Nutritional Assessment / Counseling / Intervention '[]'$  - 0 Lower Extremity Assessment (monofilament, tuning fork, pulses) '[]'$  - 0 Peripheral Arterial Disease Assessment (using hand held doppler) ASSESSMENTS - Ostomy and/or Continence Assessment and Care '[]'$  - Incontinence Assessment and Management 0 '[]'$  - 0 Ostomy Care Assessment and Management (repouching, etc.) PROCESS - Coordination of Care X - Simple Patient / Family Education for ongoing care 1 15 '[]'$  - 0 Complex (extensive) Patient / Family Education for ongoing care '[]'$  - 0 Staff obtains Programmer, systems, Records, Test Results / Process Orders '[]'$  - 0 Staff telephones HHA, Nursing Homes / Clarify orders / etc '[]'$  - 0 Routine Transfer to another Facility (non-emergent condition) '[]'$  - 0 Routine Hospital Admission (non-emergent condition) '[]'$  - 0 New Admissions / Biomedical engineer / Ordering NPWT, Apligraf, etc. '[]'$  - 0 Emergency Hospital Admission (emergent condition) X- 1 10 Simple Discharge Coordination '[]'$  - 0 Complex (extensive) Discharge Coordination PROCESS - Special Needs '[]'$  - Pediatric / Minor Patient Management 0 '[]'$  - 0 Isolation Patient Management '[]'$  - 0 Hearing / Language / Visual special needs '[]'$  - 0 Assessment of Community assistance (transportation, D/C planning, etc.) '[]'$  - 0 Additional assistance / Altered mentation '[]'$  - 0 Support Surface(s) Assessment (bed, cushion, seat, etc.) INTERVENTIONS - Wound Cleansing / Measurement Dorvil, Estill Bakes. ([id number]) '[]'$  - 0 Simple Wound Cleansing - one wound '[]'$  - 0 Complex Wound Cleansing - multiple wounds X- 1 5 Wound Imaging (photographs - any number of  wounds) '[]'$  - 0 Wound Tracing (instead of photographs) '[]'$  - 0 Simple Wound Measurement - one wound '[]'$  - 0 Complex Wound Measurement - multiple wounds INTERVENTIONS - Wound Dressings '[]'$  -  Small Wound Dressing one or multiple wounds 0 '[]'$  - 0 Medium Wound Dressing one or multiple wounds '[]'$  - 0 Large Wound Dressing one or multiple wounds '[]'$  - 0 Application of Medications - topical '[]'$  - 0 Application of Medications - injection INTERVENTIONS - Miscellaneous '[]'$  - External ear exam 0 '[]'$  - 0 Specimen Collection (cultures, biopsies, blood, body fluids, etc.) '[]'$  - 0 Specimen(s) / Culture(s) sent or taken to Lab for analysis '[]'$  - 0 Patient Transfer (multiple staff / Civil Service fast streamer / Similar devices) '[]'$  - 0 Simple Staple / Suture removal (25 or less) '[]'$  - 0 Complex Staple / Suture removal (26 or more) '[]'$  - 0 Hypo / Hyperglycemic Management (close monitor of Blood Glucose) '[]'$  - 0 Ankle / Brachial Index (ABI) - do not check if billed separately X- 1 5 Vital Signs Has the patient been seen at the hospital within the last three years: Yes Total Score: 55 Level Of Care: New/Established - Level 2 Electronic Signature(s) Signed: 05/26/2022 2:38:00 PM By: Gretta Cool, BSN, RN, CWS, Kim RN, BSN Entered By: Gretta Cool, BSN, RN, CWS, Kim on 05/26/2022 08:34:00 [name] ([id number]) -------------------------------------------------------------------------------- Encounter Discharge Information Details Patient Name: Susan Myers, [name] Date of Service: 05/26/2022 8:15 AM Medical Record Number: [id number] Patient Account Number: [id number] Date of Birth/Sex: December 10, 1950 (71 y.o. F) Treating RN: Cornell Barman Primary Care Whitt Auletta: Fulton Reek Other Clinician: Referring Zayquan Bogard: Fulton Reek Treating Loray Akard/Extender: Skipper Cliche in Treatment: 13 Encounter Discharge Information Items Discharge Condition: Stable Ambulatory Status: Ambulatory Discharge Destination: Home Transportation:  Private Auto Accompanied By: husband Schedule Follow-up Appointment: No Clinical Summary of Care: Electronic Signature(s) Signed: 05/26/2022 2:38:00 PM By: Gretta Cool, BSN, RN, CWS, Kim RN, BSN Entered By: Gretta Cool, BSN, RN, CWS, Kim on 05/26/2022 08:35:12 [name] ([id number]) -------------------------------------------------------------------------------- Lower Extremity Assessment Details Patient Name: Susan Myers, [name] Date of Service: 05/26/2022 8:15 AM Medical Record Number: [id number] Patient Account Number: [id number] Date of Birth/Sex: December 12, 1950 (70 y.o. F) Treating RN: Cornell Barman Primary Care Atheena Spano: Fulton Reek Other Clinician: Referring Conroy Goracke: Fulton Reek Treating Lonnell Chaput/Extender: Skipper Cliche in Treatment: 13 Electronic Signature(s) Signed: 05/26/2022 2:38:00 PM By: Gretta Cool, BSN, RN, CWS, Kim RN, BSN Entered By: Gretta Cool, BSN, RN, CWS, Kim on 05/26/2022 08:15:34 [name] ([id number]) -------------------------------------------------------------------------------- Multi Wound Chart Details Patient Name: Susan Myers, [name] Date of Service: 05/26/2022 8:15 AM Medical Record Number: [id number] Patient Account Number: [id number] Date of Birth/Sex: Jul 08, 1951 (71 y.o. F) Treating RN: Cornell Barman Primary Care Sarahelizabeth Conway: Fulton Reek Other Clinician: Referring Deajah Erkkila: Fulton Reek Treating Merranda Bolls/Extender: Skipper Cliche in Treatment: 13 Vital Signs Height(in): 62 Pulse(bpm): 80 Weight(lbs): 200 Blood Pressure(mmHg): 136/84 Body Mass Index(BMI): 36.6 Temperature(F): 98.1 Respiratory Rate(breaths/min): 16 Wound Assessments Treatment Notes Electronic Signature(s) Signed: 05/26/2022 2:38:00 PM By: Gretta Cool, BSN, RN, CWS, Kim RN, BSN Entered By: Gretta Cool, BSN, RN, CWS, Kim on 05/26/2022 22:02:54 [name] ([id number]) -------------------------------------------------------------------------------- [place]  Details Patient Name: Susan Myers, [name] Date of Service: 05/26/2022 8:15 AM Medical Record Number: [id number] Patient Account Number: [id number] Date of Birth/Sex: 10/31/1951 (71 y.o. F) Treating RN: Cornell Barman Primary Care Manolito Jurewicz: Fulton Reek Other Clinician: Referring Rennae Ferraiolo: Fulton Reek Treating Tristian Bouska/Extender: Skipper Cliche in Treatment: 13 Active Inactive Electronic Signature(s) Signed: 05/26/2022 2:38:00 PM By: Gretta Cool, BSN, RN, CWS, Kim RN, BSN Entered By: Gretta Cool, BSN, RN, CWS, Kim on 05/26/2022 08:15:38 [name] ([id number]) -------------------------------------------------------------------------------- Pain Assessment Details Patient Name: Susan Myers, [name] Date of Service: 05/26/2022 8:15 AM Medical Record Number: [id number] Patient Account  Number: [id number] Date of Birth/Sex: 07-03-1951 (71 y.o. F) Treating RN: Cornell Barman Primary Care Keyonda Bickle: Fulton Reek Other Clinician: Referring Santanna Olenik: Fulton Reek Treating Diesel Lina/Extender: Skipper Cliche in Treatment: 13 Active Problems Location of Pain Severity and Description of Pain Patient Has Paino No Site Locations Pain Management and Medication Current Pain Management: Notes patient denies pain at this time. Electronic Signature(s) Signed: 05/26/2022 2:38:00 PM By: Gretta Cool, BSN, RN, CWS, Kim RN, BSN Entered By: Gretta Cool, BSN, RN, CWS, Kim on 05/26/2022 08:15:16 [name] ([id number]) -------------------------------------------------------------------------------- Patient/Caregiver Education Details Patient Name: Susan Myers, [name] Date of Service: 05/26/2022 8:15 AM Medical Record Number: [id number] Patient Account Number: [id number] Date of Birth/Gender: Feb 07, 1951 (71 y.o. F) Treating RN: Cornell Barman Primary Care Physician: Fulton Reek Other Clinician: Referring Physician: Fulton Reek Treating Physician/Extender: Skipper Cliche in Treatment: 13 Education  Assessment Education Provided To: Patient Education Topics Provided Infection: Handouts: Infection Prevention and Management, Other: Mupirocin on pustules Methods: Demonstration, Explain/Verbal Responses: State content correctly Electronic Signature(s) Signed: 05/26/2022 2:38:00 PM By: Gretta Cool, BSN, RN, CWS, Kim RN, BSN Entered By: Gretta Cool, BSN, RN, CWS, Kim on 05/26/2022 08:34:40 [name] ([id number]) -------------------------------------------------------------------------------- Vitals Details Patient Name: Susan Myers, [name] Date of Service: 05/26/2022 8:15 AM Medical Record Number: [id number] Patient Account Number: [id number] Date of Birth/Sex: Mar 17, 1951 (71 y.o. F) Treating RN: Cornell Barman Primary Care Charlane Westry: Fulton Reek Other Clinician: Referring Mallerie Blok: Fulton Reek Treating Hedwig Mcfall/Extender: Skipper Cliche in Treatment: 13 Vital Signs Time Taken: 08:14 Temperature (F): 98.1 Height (in): 62 Pulse (bpm): 80 Weight (lbs): 200 Respiratory Rate (breaths/min): 16 Body Mass Index (BMI): 36.6 Blood Pressure (mmHg): 136/84 Reference Range: 80 - 120 mg / dl Electronic Signature(s) Signed: 05/26/2022 2:38:00 PM By: Gretta Cool, BSN, RN, CWS, Kim RN, BSN Entered By: Gretta Cool, BSN, RN, CWS, Kim on 05/26/2022 08:14:56

## 2022-05-26 NOTE — Progress Notes (Addendum)
[name] ([id number]) Visit Report for 05/26/2022 Chief Complaint Document Details Patient Name: Susan Myers, [name] Date of Service: 05/26/2022 8:15 AM Medical Record Number: [id number] Patient Account Number: [id number] Date of Birth/Sex: 10/30/1951 (71 y.o. F) Treating RN: Cornell Barman Primary Care Provider: Fulton Reek Other Clinician: Referring Provider: Fulton Reek Treating Provider/Extender: Skipper Cliche in Treatment: 13 Information Obtained from: Patient Chief Complaint Scalp Actinic Keratosis Electronic Signature(s) Signed: 05/26/2022 8:19:56 AM By: Worthy Keeler PA-C Entered By: Worthy Keeler on 05/26/2022 08:19:56 Dilworth, [name] ([id number]) -------------------------------------------------------------------------------- HPI Details Patient Name: [name] Date of Service: 05/26/2022 8:15 AM Medical Record Number: [id number] Patient Account Number: [id number] Date of Birth/Sex: 1951/10/15 (71 y.o. F) Treating RN: Cornell Barman Primary Care Provider: Fulton Reek Other Clinician: Referring Provider: Fulton Reek Treating Provider/Extender: Skipper Cliche in Treatment: 13 History of Present Illness HPI Description: 02/21/2022 upon evaluation today patient presents for initial evaluation here in the clinic concerning an issue she has been having with her scalp. This is a biopsy confirmed actinic keratosis which has been managed by Dr. Nehemiah Massed for the past 2 years. The patient tells me currently that he has been performing debridement to clear away some of the scab when it would form. Subsequently he would then put some ointment on it typically mupirocin is what it sounds like was the case according to the patient and subsequently that would seem to do better for time. However it is never really healed up and continues to be an issue which over the past 2 years has caused her significant problems. She is notably frustrated during the  office visit today not necessarily with me but just with the process altogether which I can completely understand. She does have a history of hypertension, chronic venous insufficiency, and delusional disorders but otherwise no major medical problems. Subsequently she has not seen any other specialist at [facility] or otherwise as far as dermatology is concerned she has not had any procedures such as laser, cryotherapy, etc. 03-03-2022 upon evaluation today patient appears to be doing well with regard to her wound on the scalp. This is actually healing quite nicely. With that being said she continues to have significant issues here with the actinic keratosis and I am afraid that even if we get this healed it is going to be apt to reopen. I discussed that with her as well. Fortunately I do not see any signs of active infection locally nor systemically. 03-10-2022 upon evaluation today patient appears to be doing well with regard to her scalp ulceration. Nonetheless this is an actinic keratosis type issue and subsequently she is still having significant issues here with being somewhat friable and bleeding although with the [facility] this is much better than anything we have seen previous. Fortunately I do not see any evidence of active infection locally or systemically which is great news. No fevers, chills, nausea, vomiting, or diarrhea. 03-17-2022 upon evaluation today patient appears to be showing some signs of more hypergranulation in regard to the scalp wound. With that being said I do not see any signs of active infection locally or systemically which is great news. 03-24-2022 upon evaluation today patient's wound actually is showing signs of doing slightly better although honestly not as great as I would like to have seen. She continues to have some significant issues here and to be honest I do believe that she would benefit from continuing with the [facility] though this is still basically just  barely  managing the situation. 04-07-2022 upon evaluation today patient appears to be doing well with regard to her wound. She has been tolerating the dressing changes without complication. Fortunately there does not appear to be any evidence of infection locally or systemically which is great news and overall I think that we are definitely headed in the right direction. She does have her appointment with dermatology in 2 days. 04-14-2022 upon evaluation today patient appears to be doing well currently in regard to her wound on the scalp. She did see dermatology and they did go through some things with her. 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 They subsequently added a different treatment option into the mix for her putting clobetasol around the edges of the wound and then using Neosporin just over the surface of the main wound area. With that being said overall this seems to have done quite well for her and things are significantly improved. Fortunately I do not see any evidence of active infection locally nor systemically which is great news and in general she looks to be almost completely healed which is great news as well. 04-28-2022 upon evaluation today patient appears to be doing well with regard to her wound on the scalp this is actually shown signs of excellent improvement and overall extremely pleased with where we are currently. I do not see any evidence of active infection locally or systemically which is great news. No fevers, chills, nausea, vomiting, or diarrhea. 05-26-2022 upon evaluation today patient appears to be doing excellent in regard to her scalp wound. Fortunately this showed signs of complete closure and overall I am extremely pleased in that regard. With that being said she does continue to have some issues here with small pustules that are forming here and there. I think this could be more of a staph type deal we often see this sometimes after  wounds heal and that often is related to bacteria more than anything else. To that end I think that one of the big changes she went from was using the Dial antibacterial soap to now using just the regular shampoo that she was using previously. For that reason I do believe that she would benefit from some topical mupirocin. Electronic Signature(s) Signed: 05/26/2022 9:02:40 AM By: Worthy Keeler PA-C Entered By: Worthy Keeler on 05/26/2022 09:02:40 Susan Myers, [name] ([id number]) -------------------------------------------------------------------------------- Physical Exam Details Patient Name: Susan Myers, [name] Date of Service: 05/26/2022 8:15 AM Medical Record Number: [id number] Patient Account Number: [id number] Date of Birth/Sex: 1951-05-08 (71 y.o. F) Treating RN: Cornell Barman Primary Care Provider: Fulton Reek Other Clinician: Referring Provider: Fulton Reek Treating Provider/Extender: Skipper Cliche in Treatment: 20 Constitutional Well-nourished and well-hydrated in no acute distress. Respiratory normal breathing without difficulty. Psychiatric this patient is able to make decisions and demonstrates good insight into disease process. Alert and Oriented x 3. pleasant and cooperative. Notes Upon inspection patient's wound bed actually showed signs of good granulation and epithelization at this point. Fortunately I do not see any evidence of active infection locally or systemically which is great news and overall very pleased with where we stand today. Electronic Signature(s) Signed: 05/26/2022 9:02:57 AM By: Worthy Keeler PA-C Entered By: Worthy Keeler on 05/26/2022 09:02:56 Susan Myers, [name] ([id number]) -------------------------------------------------------------------------------- Physician Orders Details Patient Name: Susan Myers, [name] Date of Service: 05/26/2022 8:15 AM Medical Record Number: [id number] Patient Account Number: [id number] Date of  Birth/Sex: [birth year] (71 y.o. F) Treating RN: Cornell Barman Primary  Care Provider: Fulton Reek Other Clinician: Referring Provider: Fulton Reek Treating Provider/Extender: Skipper Cliche in Treatment: 13 Verbal / Phone Orders: No Diagnosis Coding ICD-10 Coding Code Description L57.0 Actinic keratosis L98.492 Non-pressure chronic ulcer of skin of other sites with fat layer exposed I10 Essential (primary) hypertension I87.2 Venous insufficiency (chronic) (peripheral) F22 Delusional disorders Discharge From [facility] Services o Discharge from [place] Complete - Call wound care clinic with any questions or concerns. Follow-up Appointments o Other: - If needed. Bathing/ Shower/ Hygiene o Wash wounds with antibacterial soap and water. Patient Medications Allergies: Levaquin, Sulfa (Sulfonamide Antibiotics), Macrolide Antibiotics Notifications Medication Indication Start End mupirocin 05/26/2022 DOSE topical 2 % ointment - ointment topical applied to affected area on the scalp 1 time per day until healed. Electronic Signature(s) Signed: 05/26/2022 8:52:06 AM By: Worthy Keeler PA-C Entered By: Worthy Keeler on 05/26/2022 08:52:05 Susan Myers, [name] ([id number]) -------------------------------------------------------------------------------- Problem List Details Patient Name: Susan Myers, [name] Date of Service: 05/26/2022 8:15 AM Medical Record Number: [id number] Patient Account Number: [id number] Date of Birth/Sex: 1951/09/20 (71 y.o. F) Treating RN: Cornell Barman Primary Care Provider: Fulton Reek Other Clinician: Referring Provider: Fulton Reek Treating Provider/Extender: Skipper Cliche in Treatment: 13 Active Problems ICD-10 Encounter Code Description Active Date MDM Diagnosis L57.0 Actinic keratosis 02/21/2022 No Yes L98.492 Non-pressure chronic ulcer of skin of other sites with fat layer exposed 02/21/2022 No Yes I10 Essential (primary)  hypertension 02/21/2022 No Yes I87.2 Venous insufficiency (chronic) (peripheral) 02/21/2022 No Yes F22 Delusional disorders 02/21/2022 No Yes Inactive Problems Resolved Problems Electronic Signature(s) Signed: 05/26/2022 8:19:53 AM By: Worthy Keeler PA-C Entered By: Worthy Keeler on 05/26/2022 08:19:53 Susan Myers, [name] ([id number]) -------------------------------------------------------------------------------- Progress Note Details Patient Name: Susan Myers, [name] Date of Service: 05/26/2022 8:15 AM Medical Record Number: [id number] Patient Account Number: [id number] Date of Birth/Sex: Jan 17, 1951 (71 y.o. F) Treating RN: Cornell Barman Primary Care Provider: Fulton Reek Other Clinician: Referring Provider: Fulton Reek Treating Provider/Extender: Skipper Cliche in Treatment: 13 Subjective Chief Complaint Information obtained from Patient Scalp Actinic Keratosis History of Present Illness (HPI) 02/21/2022 upon evaluation today patient presents for initial evaluation here in the clinic concerning an issue she has been having with her scalp. This is a biopsy confirmed actinic keratosis which has been managed by Dr. Nehemiah Massed for the past 2 years. The patient tells me currently that he has been performing debridement to clear away some of the scab when it would form. Subsequently he would then put some ointment on it typically mupirocin is what it sounds like was the case according to the patient and subsequently that would seem to do better for time. However it is never really healed up and continues to be an issue which over the past 2 years has caused her significant problems. She is notably frustrated during the office visit today not necessarily with me but just with the process altogether which I can completely understand. She does have a history of hypertension, chronic venous insufficiency, and delusional disorders but otherwise no major medical problems. Subsequently she  has not seen any other specialist at [facility] or otherwise as far as dermatology is concerned she has not had any procedures such as laser, cryotherapy, etc. 03-03-2022 upon evaluation today patient appears to be doing well with regard to her wound on the scalp. This is actually healing quite nicely. With that being said she continues to have significant issues here with the actinic keratosis and I am afraid that even if we  get this healed it is going to be apt to reopen. I discussed that with her as well. Fortunately I do not see any signs of active infection locally nor systemically. 03-10-2022 upon evaluation today patient appears to be doing well with regard to her scalp ulceration. Nonetheless this is an actinic keratosis type issue and subsequently she is still having significant issues here with being somewhat friable and bleeding although with the [facility] this is much better than anything we have seen previous. Fortunately I do not see any evidence of active infection locally or systemically which is great news. No fevers, chills, nausea, vomiting, or diarrhea. 03-17-2022 upon evaluation today patient appears to be showing some signs of more hypergranulation in regard to the scalp wound. With that being said I do not see any signs of active infection locally or systemically which is great news. 03-24-2022 upon evaluation today patient's wound actually is showing signs of doing slightly better although honestly not as great as I would like to have seen. She continues to have some significant issues here and to be honest I do believe that she would benefit from continuing with the [facility] though this is still basically just barely managing the situation. 04-07-2022 upon evaluation today patient appears to be doing well with regard to her wound. She has been tolerating the dressing changes without complication. Fortunately there does not appear to be any evidence of infection locally or  systemically which is great news and overall I think that we are definitely headed in the right direction. She does have her appointment with dermatology in 2 days. 04-14-2022 upon evaluation today patient appears to be doing well currently in regard to her wound on the scalp. She did see dermatology and they did go through some things with her. 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 They subsequently added a different treatment option into the mix for her putting clobetasol around the edges of the wound and then using Neosporin just over the surface of the main wound area. With that being said overall this seems to have done quite well for her and things are significantly improved. Fortunately I do not see any evidence of active infection locally nor systemically which is great news and in general she looks to be almost completely healed which is great news as well. 04-28-2022 upon evaluation today patient appears to be doing well with regard to her wound on the scalp this is actually shown signs of excellent improvement and overall extremely pleased with where we are currently. I do not see any evidence of active infection locally or systemically which is great news. No fevers, chills, nausea, vomiting, or diarrhea. 05-26-2022 upon evaluation today patient appears to be doing excellent in regard to her scalp wound. Fortunately this showed signs of complete closure and overall I am extremely pleased in that regard. With that being said she does continue to have some issues here with small pustules that are forming here and there. I think this could be more of a staph type deal we often see this sometimes after wounds heal and that often is related to bacteria more than anything else. To that end I think that one of the big changes she went from was using the Dial antibacterial soap to now using just the regular shampoo that she was using previously. For that reason I do  believe that she would benefit from some topical mupirocin. Objective Susan Myers, [name] ([id number]) Constitutional  Well-nourished and well-hydrated in no acute distress. Vitals Time Taken: 8:14 AM, Height: 62 in, Weight: 200 lbs, BMI: 36.6, Temperature: 98.1 F, Pulse: 80 bpm, Respiratory Rate: 16 breaths/min, Blood Pressure: 136/84 mmHg. Respiratory normal breathing without difficulty. Psychiatric this patient is able to make decisions and demonstrates good insight into disease process. Alert and Oriented x 3. pleasant and cooperative. General Notes: Upon inspection patient's wound bed actually showed signs of good granulation and epithelization at this point. Fortunately I do not see any evidence of active infection locally or systemically which is great news and overall very pleased with where we stand today. Assessment Active Problems ICD-10 Actinic keratosis Non-pressure chronic ulcer of skin of other sites with fat layer exposed Essential (primary) hypertension Venous insufficiency (chronic) (peripheral) Delusional disorders Plan Discharge From [facility] Services: Discharge from [facility] Complete - Call wound care clinic with any questions or concerns. Follow-up Appointments: Other: - If needed. Bathing/ Shower/ Hygiene: Wash wounds with antibacterial soap and water. The following medication(s) was prescribed: mupirocin topical 2 % ointment ointment topical applied to affected area on the scalp 1 time per day until healed. starting 05/26/2022 1. At this point based on what I am seeing I would have the patient go ahead and continue with the mupirocin topically which I think has been beneficial for her as far as the wounds on the scalp were concerned. I think this can help cut down on the pustules that are forming. 2. I am also can recommend at this time that we have the patient continue with the washing with Dial antibacterial soap I think this could be better  than her standard shampoo. 3. I would also suggest she contact the office and let me know if anything reopens or if she has any issues I am pleased to see she is already having new hair starting to grow within this region. We will see patient back for reevaluation in 1 week here in the clinic. If anything worsens or changes patient will contact our office for additional recommendations. Electronic Signature(s) Signed: 05/26/2022 9:04:01 AM By: Worthy Keeler PA-C Entered By: Worthy Keeler on 05/26/2022 09:04:01 Susan Myers, [name] ([id number]) -------------------------------------------------------------------------------- SuperBill Details Patient Name: Susan Myers, [name] Date of Service: 05/26/2022 Medical Record Number: [id number] Patient Account Number: [id number] Date of Birth/Sex: 27-Jan-1951 (71 y.o. F) Treating RN: Cornell Barman Primary Care Provider: Fulton Reek Other Clinician: Referring Provider: Fulton Reek Treating Provider/Extender: Skipper Cliche in Treatment: 13 Diagnosis Coding ICD-10 Codes Code Description L57.0 Actinic keratosis L98.492 Non-pressure chronic ulcer of skin of other sites with fat layer exposed I10 Essential (primary) hypertension I87.2 Venous insufficiency (chronic) (peripheral) F22 Delusional disorders Facility Procedures CPT4 Code: [id number] Description: [phone] - WOUND CARE VISIT-LEV 2 EST PT Modifier: Quantity: 1 Physician Procedures CPT4 Code: [id number] Description: [id number] - WC PHYS LEVEL 4 - EST PT Modifier: Quantity: 1 CPT4 Code: Description: ICD-10 Diagnosis Description L57.0 Actinic keratosis L98.492 Non-pressure chronic ulcer of skin of other sites with fat layer expos I10 Essential (primary) hypertension I87.2 Venous insufficiency (chronic) (peripheral) Modifier: ed Quantity: Electronic Signature(s) Signed: 05/26/2022 9:04:14 AM By: Worthy Keeler PA-C Entered By: Worthy Keeler on 05/26/2022 09:04:14

## 2022-05-29 DIAGNOSIS — H04123 Dry eye syndrome of bilateral lacrimal glands: Secondary | ICD-10-CM | POA: Diagnosis not present

## 2022-05-29 DIAGNOSIS — D3131 Benign neoplasm of right choroid: Secondary | ICD-10-CM | POA: Diagnosis not present

## 2022-05-29 DIAGNOSIS — H2513 Age-related nuclear cataract, bilateral: Secondary | ICD-10-CM | POA: Diagnosis not present

## 2022-05-29 DIAGNOSIS — H11003 Unspecified pterygium of eye, bilateral: Secondary | ICD-10-CM | POA: Diagnosis not present

## 2022-06-09 DIAGNOSIS — E7800 Pure hypercholesterolemia, unspecified: Secondary | ICD-10-CM | POA: Diagnosis not present

## 2022-06-09 DIAGNOSIS — R8290 Unspecified abnormal findings in urine: Secondary | ICD-10-CM | POA: Diagnosis not present

## 2022-06-09 DIAGNOSIS — I10 Essential (primary) hypertension: Secondary | ICD-10-CM | POA: Diagnosis not present

## 2022-06-09 DIAGNOSIS — Z79899 Other long term (current) drug therapy: Secondary | ICD-10-CM | POA: Diagnosis not present

## 2022-06-09 DIAGNOSIS — E039 Hypothyroidism, unspecified: Secondary | ICD-10-CM | POA: Diagnosis not present

## 2022-06-09 DIAGNOSIS — R7309 Other abnormal glucose: Secondary | ICD-10-CM | POA: Diagnosis not present

## 2022-06-16 DIAGNOSIS — C50011 Malignant neoplasm of nipple and areola, right female breast: Secondary | ICD-10-CM | POA: Diagnosis not present

## 2022-06-16 DIAGNOSIS — I82402 Acute embolism and thrombosis of unspecified deep veins of left lower extremity: Secondary | ICD-10-CM | POA: Diagnosis not present

## 2022-06-16 DIAGNOSIS — Z1211 Encounter for screening for malignant neoplasm of colon: Secondary | ICD-10-CM | POA: Diagnosis not present

## 2022-06-16 DIAGNOSIS — I10 Essential (primary) hypertension: Secondary | ICD-10-CM | POA: Diagnosis not present

## 2022-06-16 DIAGNOSIS — E7800 Pure hypercholesterolemia, unspecified: Secondary | ICD-10-CM | POA: Diagnosis not present

## 2022-06-16 DIAGNOSIS — E039 Hypothyroidism, unspecified: Secondary | ICD-10-CM | POA: Diagnosis not present

## 2022-06-16 DIAGNOSIS — M797 Fibromyalgia: Secondary | ICD-10-CM | POA: Diagnosis not present

## 2022-07-02 DIAGNOSIS — Z1211 Encounter for screening for malignant neoplasm of colon: Secondary | ICD-10-CM | POA: Diagnosis not present

## 2022-08-06 ENCOUNTER — Ambulatory Visit: Payer: PPO | Admitting: Dermatology

## 2022-09-17 DIAGNOSIS — E039 Hypothyroidism, unspecified: Secondary | ICD-10-CM | POA: Diagnosis not present

## 2022-09-22 ENCOUNTER — Encounter (INDEPENDENT_AMBULATORY_CARE_PROVIDER_SITE_OTHER): Payer: Self-pay

## 2022-11-06 ENCOUNTER — Inpatient Hospital Stay: Payer: PPO | Attending: Oncology | Admitting: Oncology

## 2022-11-06 DIAGNOSIS — C50411 Malignant neoplasm of upper-outer quadrant of right female breast: Secondary | ICD-10-CM

## 2022-11-06 MED ORDER — ALENDRONATE SODIUM 70 MG PO TABS: 70.0000 mg | ORAL_TABLET | ORAL | 3 refills | Status: DC

## 2022-11-06 NOTE — Progress Notes (Signed)
[facility]  [phone] [phone]  ID: Susan Myers OB: [birth year]  MR#: [id number]  BRA#:[id number]  Patient Care Team: Idelle Crouch, MD as PCP - General (Internal Medicine) Lloyd Huger, MD as Consulting Physician (Oncology)  I connected with Susan Myers on 11/06/22 at  3:30 PM EST by video enabled telemedicine visit and verified that I am speaking with the correct person using two identifiers.   I discussed the limitations, risks, security and privacy concerns of performing an evaluation and management service by telemedicine and the availability of in-person appointments. I also discussed with the patient that there may be a patient responsible charge related to this service. The patient expressed understanding and agreed to proceed.   Other persons participating in the visit and their role in the encounter: Patient, MD.  Patient's location: Home. Provider's location: Clinic.   CHIEF COMPLAINT: Clinical stage IA ER/PR positive, HER-2 negative invasive carcinoma of the upper outer quadrant of the right breast.  INTERVAL HISTORY: Patient agreed to video assisted telemedicine visit for her routine 46-monthevaluation.  She continues to feel well and remains asymptomatic.  She is tolerating letrozole without significant side effects. She has no neurologic complaints.  She denies any recent fevers or illnesses.  She has a good appetite and denies weight loss.  She denies any chest pain, shortness of breath, cough, or hemoptysis.  She denies any nausea, vomiting, constipation, or diarrhea.  She has no urinary complaints.  Patient offers no specific complaints today.  REVIEW OF SYSTEMS:   Review of Systems  Constitutional: Negative.  Negative for fever, malaise/fatigue and weight loss.  Respiratory: Negative.  Negative for cough and shortness of breath.   Cardiovascular: Negative.  Negative for chest pain and leg swelling.   Gastrointestinal: Negative.  Negative for abdominal pain.  Genitourinary: Negative.  Negative for dysuria.  Musculoskeletal: Negative.  Negative for back pain.  Skin: Negative.  Negative for rash.  Neurological: Negative.  Negative for sensory change, focal weakness and weakness.  Psychiatric/Behavioral: Negative.  The patient is not nervous/anxious.     As per HPI. Otherwise, a complete review of systems is negative.  PAST MEDICAL HISTORY: Past Medical History:  Diagnosis Date   Actinic keratosis 01/10/2019   mid vertex scalp   [place]   invasive mammary carcinoma, lumpectomy and rad tx   Complication of anesthesia    Delusional disorder ([place]    DVT (deep [place]) [place] 06[place]   Dysplastic nevus 07/24/2020   R med calf - mod   GERD (gastroesophageal reflux disease)    History of kidney stones    Hx of blood clots    Hypercholesterolemia    Hypothyroidism    Kidney stones    Personal history of radiation therapy 2019   right breast cancer   PONV (postoperative nausea and vomiting)    Thyroid disease     PAST SURGICAL HISTORY: Past Surgical History:  Procedure Laterality Date   ABDOMINAL HYSTERECTOMY     APPENDECTOMY     BREAST BIOPSY Right 03/23/2018   Affirm Bx- invasive mammary carcinoma   BREAST CYST ASPIRATION Bilateral 1990   BREAST LUMPECTOMY Right 2019   invasive mammary carcinoma, clear surgical margins   COLONOSCOPY     IVC FILTER INSERTION     PARTIAL MASTECTOMY WITH NEEDLE LOCALIZATION Right 04/01/2018   Procedure: PARTIAL MASTECTOMY WITH NEEDLE LOCALIZATION;  Surgeon: CHerbert Pun MD;  Location: [facility];  Service: General;  Laterality:  Right;   PERIPHERAL VASCULAR CATHETERIZATION N/A 08/06/2016   Procedure: IVC Filter Insertion;  Surgeon: Katha Cabal, MD;  Location: [place] [facility];  Service: Cardiovascular;  Laterality: N/A;   PERIPHERAL VASCULAR CATHETERIZATION Left 08/06/2016   Procedure: Lower  Extremity Venography;  Surgeon: Katha Cabal, MD;  Location: [facility];  Service: Cardiovascular;  Laterality: Left;   PERIPHERAL VASCULAR CATHETERIZATION  08/06/2016   Procedure: Lower Extremity Intervention;  Surgeon: Katha Cabal, MD;  Location: [facility];  Service: Cardiovascular;;   PERIPHERAL VASCULAR CATHETERIZATION N/A 12/16/2016   Procedure: IVC Filter Removal;  Surgeon: Katha Cabal, MD;  Location: [facility];  Service: Cardiovascular;  Laterality: N/A;   SENTINEL NODE BIOPSY Right 04/01/2018   Procedure: SENTINEL NODE BIOPSY;  Surgeon: Herbert Pun, MD;  Location: [facility];  Service: General;  Laterality: Right;   TONSILLECTOMY      FAMILY HISTORY: Family History  Problem Relation Age of Onset   Breast cancer Neg Hx    Prostate cancer Neg Hx    Kidney cancer Neg Hx     ADVANCED DIRECTIVES (Y/N):  N  HEALTH MAINTENANCE: Social History   Tobacco Use   Smoking status: Never   Smokeless tobacco: Never  Vaping Use   Vaping Use: Never used  Substance Use Topics   Alcohol use: No   Drug use: No     Colonoscopy:  PAP:  Bone density:  Lipid panel:  Allergies  Allergen Reactions   Levaquin [Levofloxacin In D5w] Hives   Macrolides And Ketolides Other (See Comments)    WHELPS WHELPS WHELPS   Penicillins Itching    Can take amoxicillin  Has patient had a PCN reaction causing immediate rash, facial/tongue/throat swelling, SOB or lightheadedness with hypotension:No Has patient had a PCN reaction causing severe rash involving mucus membranes or skin necrosis:unsure Has patient had a PCN reaction that required hospitalization:No Has patient had a PCN reaction occurring within the last 10 years: No If all of the above answers are "NO", then may proceed with Cephalosporin use.   Sulfa Antibiotics Hives    Current Outpatient Medications  Medication Sig Dispense Refill   acetaminophen (TYLENOL) 500 MG tablet Take 500  mg by mouth every 6 (six) hours as needed for moderate pain or headache.     alendronate (FOSAMAX) 70 MG tablet Take 1 tablet (70 mg total) by mouth once a week. Take with a full glass of water on an empty stomach. 12 tablet 3   Cholecalciferol (VITAMIN D PO) Take 1 tablet by mouth once a week.     hydrochlorothiazide (HYDRODIURIL) 25 MG tablet Take 25 mg by mouth daily.     letrozole (FEMARA) 2.5 MG tablet Take 1 tablet (2.5 mg total) by mouth daily. 90 tablet 3   levothyroxine (SYNTHROID) 125 MCG tablet Take 125 mcg by mouth daily.     meloxicam (MOBIC) 7.5 MG tablet Take 7.5 mg by mouth daily as needed for pain.     neomycin-polymyxin-hydrocortisone (CORTISPORIN) 3.5-10000-1 OTIC suspension Apply 1-2 drops daily after soaking and cover with bandaid (Patient not taking: Reported on 11/06/2021) 10 mL 0   phenylephrine (SUDAFED PE) 10 MG TABS tablet Take 10 mg by mouth every 6 (six) hours as needed (congestion). (Patient not taking: Reported on 11/06/2021)     risperiDONE (RISPERDAL) 1 MG tablet Take 1 mg by mouth at bedtime.     rivaroxaban (XARELTO) 20 MG TABS tablet Take 20 mg by mouth daily.  simvastatin (ZOCOR) 20 MG tablet Take 20 mg by mouth every evening.      No current facility-administered medications for this visit.    OBJECTIVE: There were no vitals filed for this visit.   There is no height or weight on file to calculate BMI.    ECOG FS:0 - Asymptomatic  General: Well-developed, well-nourished, no acute distress. HEENT: Normocephalic. Neuro: Alert, answering all questions appropriately. Cranial nerves grossly intact. Psych: Normal affect.  LAB RESULTS:  Lab Results  Component Value Date   NA 143 11/23/2015   K 3.4 (L) 11/23/2015   CL 105 11/23/2015   CO2 28 11/23/2015   GLUCOSE 101 (H) 11/23/2015   BUN 23 (H) 08/06/2016   CREATININE 0.88 08/06/2016   CALCIUM 9.7 11/23/2015   PROT 7.5 11/23/2015   ALBUMIN 4.8 11/23/2015   AST 22 11/23/2015   ALT 24 11/23/2015    ALKPHOS 88 11/23/2015   BILITOT 0.8 11/23/2015   GFRNONAA >60 08/06/2016   GFRAA >60 08/06/2016    Lab Results  Component Value Date   WBC 5.5 06/15/2018   HGB 14.5 06/15/2018   HCT 42.7 06/15/2018   MCV 93.7 06/15/2018   PLT 168 06/15/2018     STUDIES: No results found.  ASSESSMENT: Clinical stage IA ER/PR positive, HER-2 negative invasive carcinoma of the upper outer quadrant of the right breast.  Oncotype DX score 6, low risk.  PLAN:    1. Clinical stage IA ER/PR positive, HER-2 negative invasive carcinoma of the upper outer quadrant of the right breast: Patient underwent lumpectomy on Apr 01, 2018 confirming the stage of disease.  She had an Oncotype DX score of 6 which confers a low risk therefore patient did not require adjuvant chemotherapy.  She completed adjuvant XRT in approximately August 2019.  Continue letrozole for a total of 5 years completing in August 2024.  Her most recent mammogram on Apr 23, 2022 was reported as BI-RADS 2.  Repeat in June 2024.  Return to clinic in August 2024 at which time patient will discontinue letrozole.  We will also determine whether patient can be discharged from clinic or should continue follow-up on a yearly basis.   2.  Osteopenia: Bone mineral density on Apr 23, 2022 reported T score of -2.6 which is significantly reduced from 1 year prior where her T score was reported -1.8.  Continue Fosamax, calcium, and vitamin D supplementation.  Repeat in June 2024.  Return to clinic in August 2024 as above.  I provided 20 minutes of face-to-face video visit time during this encounter which included chart review, counseling, and coordination of care as documented above.    Patient expressed understanding and was in agreement with this plan. She also understands that She can call clinic at any time with any questions, concerns, or complaints.    Cancer Staging  Primary cancer of upper outer quadrant of right female breast [employer] [facility]) Staging form:  Breast, AJCC 8th Edition - Clinical stage from 03/26/2018: Stage IA (cT1b, cN0, cM0, G2, ER+, PR+, HER2-) - Signed by Lloyd Huger, MD on 03/26/2018 Histologic grading system: 3 grade system Laterality: Right   Lloyd Huger, MD   11/06/2022 3:24 PM

## 2022-12-03 DIAGNOSIS — L988 Other specified disorders of the skin and subcutaneous tissue: Secondary | ICD-10-CM | POA: Diagnosis not present

## 2022-12-18 DIAGNOSIS — C50011 Malignant neoplasm of nipple and areola, right female breast: Secondary | ICD-10-CM | POA: Diagnosis not present

## 2022-12-18 DIAGNOSIS — Z0000 Encounter for general adult medical examination without abnormal findings: Secondary | ICD-10-CM | POA: Diagnosis not present

## 2022-12-18 DIAGNOSIS — Z79899 Other long term (current) drug therapy: Secondary | ICD-10-CM | POA: Diagnosis not present

## 2022-12-18 DIAGNOSIS — R7303 Prediabetes: Secondary | ICD-10-CM | POA: Diagnosis not present

## 2022-12-18 DIAGNOSIS — R002 Palpitations: Secondary | ICD-10-CM | POA: Diagnosis not present

## 2022-12-18 DIAGNOSIS — I10 Essential (primary) hypertension: Secondary | ICD-10-CM | POA: Diagnosis not present

## 2022-12-18 DIAGNOSIS — R5382 Chronic fatigue, unspecified: Secondary | ICD-10-CM | POA: Diagnosis not present

## 2022-12-18 DIAGNOSIS — E7800 Pure hypercholesterolemia, unspecified: Secondary | ICD-10-CM | POA: Diagnosis not present

## 2022-12-18 DIAGNOSIS — R7309 Other abnormal glucose: Secondary | ICD-10-CM | POA: Diagnosis not present

## 2022-12-18 DIAGNOSIS — E039 Hypothyroidism, unspecified: Secondary | ICD-10-CM | POA: Diagnosis not present

## 2022-12-18 DIAGNOSIS — E785 Hyperlipidemia, unspecified: Secondary | ICD-10-CM | POA: Diagnosis not present

## 2022-12-31 DIAGNOSIS — L988 Other specified disorders of the skin and subcutaneous tissue: Secondary | ICD-10-CM | POA: Diagnosis not present

## 2023-01-01 DIAGNOSIS — R002 Palpitations: Secondary | ICD-10-CM | POA: Diagnosis not present

## 2023-01-16 DIAGNOSIS — I10 Essential (primary) hypertension: Secondary | ICD-10-CM | POA: Diagnosis not present

## 2023-01-16 DIAGNOSIS — E669 Obesity, unspecified: Secondary | ICD-10-CM | POA: Diagnosis not present

## 2023-01-16 DIAGNOSIS — E7800 Pure hypercholesterolemia, unspecified: Secondary | ICD-10-CM | POA: Diagnosis not present

## 2023-01-16 DIAGNOSIS — R7303 Prediabetes: Secondary | ICD-10-CM | POA: Diagnosis not present

## 2023-01-16 DIAGNOSIS — I38 Endocarditis, valve unspecified: Secondary | ICD-10-CM | POA: Diagnosis not present

## 2023-01-16 DIAGNOSIS — I340 Nonrheumatic mitral (valve) insufficiency: Secondary | ICD-10-CM | POA: Diagnosis not present

## 2023-01-16 DIAGNOSIS — K219 Gastro-esophageal reflux disease without esophagitis: Secondary | ICD-10-CM | POA: Diagnosis not present

## 2023-01-19 ENCOUNTER — Other Ambulatory Visit (INDEPENDENT_AMBULATORY_CARE_PROVIDER_SITE_OTHER): Payer: Self-pay | Admitting: Vascular Surgery

## 2023-01-19 DIAGNOSIS — I82512 Chronic embolism and thrombosis of left femoral vein: Secondary | ICD-10-CM

## 2023-01-19 DIAGNOSIS — Z95828 Presence of other vascular implants and grafts: Secondary | ICD-10-CM

## 2023-01-19 NOTE — Progress Notes (Unsigned)
MRN : [id number]  Susan Myers is a 72 y.o. ([birth year]) female who presents with chief complaint of legs hurt and swell.  History of Present Illness:  The patient returns to the office for followup regarding her IVC filter.  She is status post angiogram with attempted IVC filter removal on 12/16/2016.  Hematuria has not recurred and she is now back on her Xarelto.   Swelling of her left leg remains stable and "not much" at this time.   There have been no significant changes to the patient's overall health care.   The patient denies amaurosis fugax or recent TIA symptoms. There are no recent neurological changes noted. The patient denies history of DVT, PE or superficial thrombophlebitis. The patient denies recent episodes of angina or shortness of breath.     Duplex ultrasound of the IVC is negative for clot normal flow pattern is identified. No significant change compared to previous study.  No outpatient medications have been marked as taking for the 01/22/23 encounter (Appointment) with Delana Meyer, Dolores Lory, MD.    Past Medical History:  Diagnosis Date   Actinic keratosis 01/10/2019   mid vertex scalp   Breast cancer ([place]) 2019   invasive mammary carcinoma, lumpectomy and rad tx   Complication of anesthesia    Delusional disorder ([place])    DVT (deep venous thrombosis) ([place]) 06/2016   Dysplastic nevus 07/24/2020   R med calf - mod   GERD (gastroesophageal reflux disease)    History of kidney stones    Hx of blood clots    Hypercholesterolemia    Hypothyroidism    Kidney stones    Personal history of radiation therapy 2019   right breast cancer   PONV (postoperative nausea and vomiting)    Thyroid disease     Past Surgical History:  Procedure Laterality Date   ABDOMINAL HYSTERECTOMY     APPENDECTOMY     BREAST BIOPSY Right 03/23/2018   Affirm Bx- invasive mammary carcinoma   BREAST CYST ASPIRATION Bilateral 1990   BREAST LUMPECTOMY Right 2019    invasive mammary carcinoma, clear surgical margins   COLONOSCOPY     IVC FILTER INSERTION     PARTIAL MASTECTOMY WITH NEEDLE LOCALIZATION Right 04/01/2018   Procedure: PARTIAL MASTECTOMY WITH NEEDLE LOCALIZATION;  Surgeon: Herbert Pun, MD;  Location: [facility];  Service: General;  Laterality: Right;   PERIPHERAL VASCULAR CATHETERIZATION N/A 08/06/2016   Procedure: IVC Filter Insertion;  Surgeon: Katha Cabal, MD;  Location: [place] [facility];  Service: Cardiovascular;  Laterality: N/A;   PERIPHERAL VASCULAR CATHETERIZATION Left 08/06/2016   Procedure: Lower Extremity Venography;  Surgeon: Katha Cabal, MD;  Location: [place] [facility];  Service: Cardiovascular;  Laterality: Left;   PERIPHERAL VASCULAR CATHETERIZATION  08/06/2016   Procedure: Lower Extremity Intervention;  Surgeon: Katha Cabal, MD;  Location: [place] [facility];  Service: Cardiovascular;;   PERIPHERAL VASCULAR CATHETERIZATION N/A 12/16/2016   Procedure: IVC Filter Removal;  Surgeon: Katha Cabal, MD;  Location: [place] [facility];  Service: Cardiovascular;  Laterality: N/A;   SENTINEL NODE BIOPSY Right 04/01/2018   Procedure: SENTINEL NODE BIOPSY;  Surgeon: Herbert Pun, MD;  Location: [facility];  Service: General;  Laterality: Right;   TONSILLECTOMY      Social History Social History   Tobacco Use   Smoking status: Never   Smokeless tobacco: Never  Vaping Use   Vaping Use: Never used  Substance Use Topics   Alcohol use: No   Drug use: No    Family History Family History  Problem Relation Age of Onset   Breast cancer Neg Hx    Prostate cancer Neg Hx    Kidney cancer Neg Hx     Allergies  Allergen Reactions   Levaquin [Levofloxacin In D5w] Hives   Macrolides And Ketolides Other (See Comments)    WHELPS WHELPS WHELPS   Penicillins Itching    Can take amoxicillin  Has patient had a PCN reaction causing immediate rash, facial/tongue/throat swelling, SOB or  lightheadedness with hypotension:No Has patient had a PCN reaction causing severe rash involving mucus membranes or skin necrosis:unsure Has patient had a PCN reaction that required hospitalization:No Has patient had a PCN reaction occurring within the last 10 years: No If all of the above answers are "NO", then may proceed with Cephalosporin use.   Sulfa Antibiotics Hives     REVIEW OF SYSTEMS (Negative unless checked)  Constitutional: '[]'$ Weight loss  '[]'$ Fever  '[]'$ Chills Cardiac: '[]'$ Chest pain   '[]'$ Chest pressure   '[]'$ Palpitations   '[]'$ Shortness of breath when laying flat   '[]'$ Shortness of breath with exertion. Vascular:  '[]'$ Pain in legs with walking   '[x]'$ Pain in legs at rest  '[]'$ History of DVT   '[]'$ Phlebitis   '[x]'$ Swelling in legs   '[]'$ Varicose veins   '[]'$ Non-healing ulcers Pulmonary:   '[]'$ Uses home oxygen   '[]'$ Productive cough   '[]'$ Hemoptysis   '[]'$ Wheeze  '[]'$ COPD   '[]'$ Asthma Neurologic:  '[]'$ Dizziness   '[]'$ Seizures   '[]'$ History of stroke   '[]'$ History of TIA  '[]'$ Aphasia   '[]'$ Vissual changes   '[]'$ Weakness or numbness in arm   '[]'$ Weakness or numbness in leg Musculoskeletal:   '[]'$ Joint swelling   '[]'$ Joint pain   '[]'$ Low back pain Hematologic:  '[]'$ Easy bruising  '[]'$ Easy bleeding   '[]'$ Hypercoagulable state   '[]'$ Anemic Gastrointestinal:  '[]'$ Diarrhea   '[]'$ Vomiting  '[]'$ Gastroesophageal reflux/heartburn   '[]'$ Difficulty swallowing. Genitourinary:  '[]'$ Chronic kidney disease   '[]'$ Difficult urination  '[]'$ Frequent urination   '[]'$ Blood in urine Skin:  '[]'$ Rashes   '[]'$ Ulcers  Psychological:  '[]'$ History of anxiety   '[]'$  History of major depression.  Physical Examination  There were no vitals filed for this visit. There is no height or weight on file to calculate BMI. Gen: WD/WN, NAD Head: [place]/AT, No temporalis wasting.  Ear/Nose/Throat: Hearing grossly intact, nares w/o erythema or drainage, pinna without lesions Eyes: PER, EOMI, sclera nonicteric.  Neck: Supple, no gross masses.  No JVD.  Pulmonary:  Good air movement, no audible wheezing, no use  of accessory muscles.  Cardiac: RRR, precordium not hyperdynamic. Vascular:  scattered varicosities present bilaterally.  Moderate venous stasis changes to the legs bilaterally.  2+ soft pitting edema. CEAP [id number]   Vessel Right Left  Radial Palpable Palpable  Gastrointestinal: soft, non-distended. No guarding/no peritoneal signs.  Musculoskeletal: M/S 5/5 throughout.  No deformity.  Neurologic: CN 2-12 intact. Pain and light touch intact in extremities.  Symmetrical.  Speech is fluent. Motor exam as listed above. Psychiatric: Judgment intact, Mood & affect appropriate for pt's clinical situation. Dermatologic: Venous rashes no ulcers noted.  No changes consistent with cellulitis. Lymph : No lichenification or skin changes of chronic lymphedema.  CBC Lab Results  Component Value Date   WBC 5.5 06/15/2018   HGB 14.5 06/15/2018   HCT 42.7 06/15/2018   MCV 93.7 06/15/2018   PLT 168 06/15/2018    BMET    Component Value Date/Time   NA 143 11/23/2015  2103   K 3.4 (L) 11/23/2015 2103   CL 105 11/23/2015 2103   CO2 28 11/23/2015 2103   GLUCOSE 101 (H) 11/23/2015 2103   BUN 23 (H) 08/06/2016 1426   CREATININE 0.88 08/06/2016 1426   CALCIUM 9.7 11/23/2015 2103   GFRNONAA >60 08/06/2016 1426   GFRAA >60 08/06/2016 1426   CrCl cannot be calculated (Patient's most recent lab result is older than the maximum 21 days allowed.).  COAG Lab Results  Component Value Date   INR 1.33 08/06/2016    Radiology No results found.   Assessment/Plan There are no diagnoses linked to this encounter.   Hortencia Pilar, MD  01/19/2023 12:43 PM

## 2023-01-22 ENCOUNTER — Ambulatory Visit (INDEPENDENT_AMBULATORY_CARE_PROVIDER_SITE_OTHER): Payer: PPO | Admitting: Vascular Surgery

## 2023-01-22 ENCOUNTER — Encounter (INDEPENDENT_AMBULATORY_CARE_PROVIDER_SITE_OTHER): Payer: Self-pay | Admitting: Vascular Surgery

## 2023-01-22 ENCOUNTER — Ambulatory Visit (INDEPENDENT_AMBULATORY_CARE_PROVIDER_SITE_OTHER): Payer: PPO

## 2023-01-22 VITALS — BP 122/85 | HR 79 | Resp 18 | Ht 62.5 in | Wt 204.2 lb

## 2023-01-22 DIAGNOSIS — I890 Lymphedema, not elsewhere classified: Secondary | ICD-10-CM | POA: Diagnosis not present

## 2023-01-22 DIAGNOSIS — I10 Essential (primary) hypertension: Secondary | ICD-10-CM | POA: Diagnosis not present

## 2023-01-22 DIAGNOSIS — Z95828 Presence of other vascular implants and grafts: Secondary | ICD-10-CM | POA: Diagnosis not present

## 2023-01-22 DIAGNOSIS — E7800 Pure hypercholesterolemia, unspecified: Secondary | ICD-10-CM | POA: Diagnosis not present

## 2023-01-22 DIAGNOSIS — I82512 Chronic embolism and thrombosis of left femoral vein: Secondary | ICD-10-CM

## 2023-01-22 DIAGNOSIS — I872 Venous insufficiency (chronic) (peripheral): Secondary | ICD-10-CM

## 2023-02-02 DIAGNOSIS — I38 Endocarditis, valve unspecified: Secondary | ICD-10-CM | POA: Diagnosis not present

## 2023-02-19 DIAGNOSIS — I340 Nonrheumatic mitral (valve) insufficiency: Secondary | ICD-10-CM | POA: Diagnosis not present

## 2023-02-19 DIAGNOSIS — E669 Obesity, unspecified: Secondary | ICD-10-CM | POA: Diagnosis not present

## 2023-02-19 DIAGNOSIS — E7800 Pure hypercholesterolemia, unspecified: Secondary | ICD-10-CM | POA: Diagnosis not present

## 2023-02-19 DIAGNOSIS — R7303 Prediabetes: Secondary | ICD-10-CM | POA: Diagnosis not present

## 2023-02-19 DIAGNOSIS — K219 Gastro-esophageal reflux disease without esophagitis: Secondary | ICD-10-CM | POA: Diagnosis not present

## 2023-02-19 DIAGNOSIS — I825Y9 Chronic embolism and thrombosis of unspecified deep veins of unspecified proximal lower extremity: Secondary | ICD-10-CM | POA: Diagnosis not present

## 2023-02-19 DIAGNOSIS — I10 Essential (primary) hypertension: Secondary | ICD-10-CM | POA: Diagnosis not present

## 2023-03-20 DIAGNOSIS — E039 Hypothyroidism, unspecified: Secondary | ICD-10-CM | POA: Diagnosis not present

## 2023-04-01 ENCOUNTER — Other Ambulatory Visit: Payer: Self-pay | Admitting: Oncology

## 2023-04-08 DIAGNOSIS — L988 Other specified disorders of the skin and subcutaneous tissue: Secondary | ICD-10-CM | POA: Diagnosis not present

## 2023-04-27 ENCOUNTER — Ambulatory Visit
Admission: RE | Admit: 2023-04-27 | Discharge: 2023-04-27 | Disposition: A | Discharge status: Home / Self Care | Payer: PPO | Source: Ambulatory Visit | Attending: Oncology | Admitting: Oncology

## 2023-04-27 DIAGNOSIS — C50411 Malignant neoplasm of upper-outer quadrant of right female breast: Secondary | ICD-10-CM | POA: Insufficient documentation

## 2023-04-27 DIAGNOSIS — Z853 Personal history of malignant neoplasm of breast: Secondary | ICD-10-CM | POA: Diagnosis not present

## 2023-04-27 DIAGNOSIS — Z923 Personal history of irradiation: Secondary | ICD-10-CM | POA: Diagnosis not present

## 2023-04-27 DIAGNOSIS — M85831 Other specified disorders of bone density and structure, right forearm: Secondary | ICD-10-CM | POA: Diagnosis not present

## 2023-04-27 DIAGNOSIS — Z1231 Encounter for screening mammogram for malignant neoplasm of breast: Secondary | ICD-10-CM | POA: Diagnosis not present

## 2023-04-27 DIAGNOSIS — M8589 Other specified disorders of bone density and structure, multiple sites: Secondary | ICD-10-CM | POA: Insufficient documentation

## 2023-04-27 DIAGNOSIS — E890 Postprocedural hypothyroidism: Secondary | ICD-10-CM | POA: Insufficient documentation

## 2023-04-27 DIAGNOSIS — Z780 Asymptomatic menopausal state: Secondary | ICD-10-CM | POA: Insufficient documentation

## 2023-04-27 DIAGNOSIS — Z13820 Encounter for screening for osteoporosis: Secondary | ICD-10-CM | POA: Diagnosis not present

## 2023-04-27 DIAGNOSIS — M85851 Other specified disorders of bone density and structure, right thigh: Secondary | ICD-10-CM | POA: Diagnosis not present

## 2023-06-18 DIAGNOSIS — I10 Essential (primary) hypertension: Secondary | ICD-10-CM | POA: Diagnosis not present

## 2023-06-18 DIAGNOSIS — E7800 Pure hypercholesterolemia, unspecified: Secondary | ICD-10-CM | POA: Diagnosis not present

## 2023-06-18 DIAGNOSIS — R8290 Unspecified abnormal findings in urine: Secondary | ICD-10-CM | POA: Diagnosis not present

## 2023-06-18 DIAGNOSIS — R7303 Prediabetes: Secondary | ICD-10-CM | POA: Diagnosis not present

## 2023-06-18 DIAGNOSIS — I82402 Acute embolism and thrombosis of unspecified deep veins of left lower extremity: Secondary | ICD-10-CM | POA: Diagnosis not present

## 2023-06-18 DIAGNOSIS — Z79899 Other long term (current) drug therapy: Secondary | ICD-10-CM | POA: Diagnosis not present

## 2023-06-18 DIAGNOSIS — E039 Hypothyroidism, unspecified: Secondary | ICD-10-CM | POA: Diagnosis not present

## 2023-06-18 DIAGNOSIS — Z1211 Encounter for screening for malignant neoplasm of colon: Secondary | ICD-10-CM | POA: Diagnosis not present

## 2023-06-24 DIAGNOSIS — Z1211 Encounter for screening for malignant neoplasm of colon: Secondary | ICD-10-CM | POA: Diagnosis not present

## 2023-07-13 ENCOUNTER — Inpatient Hospital Stay: Payer: PPO | Attending: Oncology | Admitting: Oncology

## 2023-07-13 DIAGNOSIS — C50411 Malignant neoplasm of upper-outer quadrant of right female breast: Secondary | ICD-10-CM

## 2023-07-13 DIAGNOSIS — M8580 Other specified disorders of bone density and structure, unspecified site: Secondary | ICD-10-CM

## 2023-07-13 DIAGNOSIS — Z853 Personal history of malignant neoplasm of breast: Secondary | ICD-10-CM

## 2023-07-13 NOTE — Progress Notes (Signed)
[facility]  Telephone:([phone]) [phone] Fax:(336) [phone]  ID: Susan Myers OB: 01-30-1951  MR#: [id number]  JQB#:[id number]  Patient Care Team: Marguarite Arbour, MD as PCP - General (Internal Medicine) Jeralyn Ruths, MD as Consulting Physician (Oncology)  I connected with Susan Myers on 07/13/23 at 11:15 AM EDT by video enabled telemedicine visit and verified that I am speaking with the correct person using two identifiers.   I discussed the limitations, risks, security and privacy concerns of performing an evaluation and management service by telemedicine and the availability of in-person appointments. I also discussed with the patient that there may be a patient responsible charge related to this service. The patient expressed understanding and agreed to proceed.   Other persons participating in the visit and their role in the encounter: Patient, MD.  Patient's location: Home. Provider's location: Clinic.   CHIEF COMPLAINT: Clinical stage IA ER/PR positive, HER-2 negative invasive carcinoma of the upper outer quadrant of the right breast.  INTERVAL HISTORY: Patient agreed to video assisted telemedicine visit for routine 39-month evaluation.  She completed her 5 years of letrozole several weeks ago.  She currently feels well and is asymptomatic. She has no neurologic complaints.  She denies any recent fevers or illnesses.  She has a good appetite and denies weight loss.  She denies any chest pain, shortness of breath, cough, or hemoptysis.  She denies any nausea, vomiting, constipation, or diarrhea.  She has no urinary complaints.  Patient offers no specific complaints today.  REVIEW OF SYSTEMS:   Review of Systems  Constitutional: Negative.  Negative for fever, malaise/fatigue and weight loss.  Respiratory: Negative.  Negative for cough and shortness of breath.   Cardiovascular: Negative.  Negative for chest pain and leg swelling.   Gastrointestinal: Negative.  Negative for abdominal pain.  Genitourinary: Negative.  Negative for dysuria.  Musculoskeletal: Negative.  Negative for back pain.  Skin: Negative.  Negative for rash.  Neurological: Negative.  Negative for sensory change, focal weakness and weakness.  Psychiatric/Behavioral: Negative.  The patient is not nervous/anxious.     As per HPI. Otherwise, a complete review of systems is negative.  PAST MEDICAL HISTORY: Past Medical History:  Diagnosis Date   Actinic keratosis 01/10/2019   mid vertex scalp   Breast cancer (HCC) 2019   invasive mammary carcinoma, lumpectomy and rad tx   Complication of anesthesia    Delusional disorder (HCC)    DVT (deep venous thrombosis) (HCC) 06/2016   Dysplastic nevus 07/24/2020   R med calf - mod   GERD (gastroesophageal reflux disease)    History of kidney stones    Hx of blood clots    Hypercholesterolemia    Hypothyroidism    Kidney stones    Personal history of radiation therapy 2019   right breast cancer   PONV (postoperative nausea and vomiting)    Thyroid disease     PAST SURGICAL HISTORY: Past Surgical History:  Procedure Laterality Date   ABDOMINAL HYSTERECTOMY     APPENDECTOMY     BREAST BIOPSY Right 03/23/2018   Affirm Bx- invasive mammary carcinoma   BREAST CYST ASPIRATION Bilateral 1990   BREAST LUMPECTOMY Right 2019   invasive mammary carcinoma, clear surgical margins   COLONOSCOPY     IVC FILTER INSERTION     PARTIAL MASTECTOMY WITH NEEDLE LOCALIZATION Right 04/01/2018   Procedure: PARTIAL MASTECTOMY WITH NEEDLE LOCALIZATION;  Surgeon: Carolan Shiver, MD;  Location: [facility];  Service: General;  Laterality: Right;  PERIPHERAL VASCULAR CATHETERIZATION N/A 08/06/2016   Procedure: IVC Filter Insertion;  Surgeon: Renford Dills, MD;  Location: [facility];  Service: Cardiovascular;  Laterality: N/A;   PERIPHERAL VASCULAR CATHETERIZATION Left 08/06/2016   Procedure: Lower  Extremity Venography;  Surgeon: Renford Dills, MD;  Location: [facility];  Service: Cardiovascular;  Laterality: Left;   PERIPHERAL VASCULAR CATHETERIZATION  08/06/2016   Procedure: Lower Extremity Intervention;  Surgeon: Renford Dills, MD;  Location: [facility];  Service: Cardiovascular;;   PERIPHERAL VASCULAR CATHETERIZATION N/A 12/16/2016   Procedure: IVC Filter Removal;  Surgeon: Renford Dills, MD;  Location: [facility];  Service: Cardiovascular;  Laterality: N/A;   SENTINEL NODE BIOPSY Right 04/01/2018   Procedure: SENTINEL NODE BIOPSY;  Surgeon: Carolan Shiver, MD;  Location: [facility];  Service: General;  Laterality: Right;   TONSILLECTOMY      FAMILY HISTORY: Family History  Problem Relation Age of Onset   Breast cancer Neg Hx    Prostate cancer Neg Hx    Kidney cancer Neg Hx     ADVANCED DIRECTIVES (Y/N):  N  HEALTH MAINTENANCE: Social History   Tobacco Use   Smoking status: Never   Smokeless tobacco: Never  Vaping Use   Vaping status: Never Used  Substance Use Topics   Alcohol use: No   Drug use: No     Colonoscopy:  PAP:  Bone density:  Lipid panel:  Allergies  Allergen Reactions   Levaquin [Levofloxacin In D5w] Hives   Macrolides And Ketolides Other (See Comments)    WHELPS WHELPS WHELPS   Penicillins Itching    Can take amoxicillin  Has patient had a PCN reaction causing immediate rash, facial/tongue/throat swelling, SOB or lightheadedness with hypotension:No Has patient had a PCN reaction causing severe rash involving mucus membranes or skin necrosis:unsure Has patient had a PCN reaction that required hospitalization:No Has patient had a PCN reaction occurring within the last 10 years: No If all of the above answers are "NO", then may proceed with Cephalosporin use.   Sulfa Antibiotics Hives    Current Outpatient Medications  Medication Sig Dispense Refill   acetaminophen (TYLENOL) 500 MG tablet Take  500 mg by mouth every 6 (six) hours as needed for moderate pain or headache.     alendronate (FOSAMAX) 70 MG tablet TAKE 1 TABLET(70 MG) BY MOUTH 1 TIME A WEEK WITH A FULL GLASS OF WATER AND ON AN EMPTY STOMACH 12 tablet 3   Cholecalciferol (VITAMIN D PO) Take 1 tablet by mouth once a week.     Cod Liver Oil 10 MINIM CAPS Take by mouth.     hydrochlorothiazide (HYDRODIURIL) 25 MG tablet Take 25 mg by mouth daily.     letrozole (FEMARA) 2.5 MG tablet Take 1 tablet (2.5 mg total) by mouth daily. 90 tablet 3   levothyroxine (SYNTHROID) 125 MCG tablet Take 125 mcg by mouth daily.     meloxicam (MOBIC) 7.5 MG tablet Take 7.5 mg by mouth daily as needed for pain.     neomycin-polymyxin-hydrocortisone (CORTISPORIN) 3.5-10000-1 OTIC suspension Apply 1-2 drops daily after soaking and cover with bandaid (Patient not taking: Reported on 11/06/2021) 10 mL 0   phenylephrine (SUDAFED PE) 10 MG TABS tablet Take 10 mg by mouth every 6 (six) hours as needed (congestion).     risperiDONE (RISPERDAL) 1 MG tablet Take 1 mg by mouth at bedtime.     rivaroxaban (XARELTO) 20 MG TABS tablet Take 20 mg  by mouth daily.     simvastatin (ZOCOR) 20 MG tablet Take 20 mg by mouth every evening.      No current facility-administered medications for this visit.    OBJECTIVE: There were no vitals filed for this visit.   There is no height or weight on file to calculate BMI.    ECOG FS:0 - Asymptomatic  General: Well-developed, well-nourished, no acute distress. HEENT: Normocephalic. Neuro: Alert, answering all questions appropriately. Cranial nerves grossly intact. Psych: Normal affect.  LAB RESULTS:  Lab Results  Component Value Date   NA 143 11/23/2015   K 3.4 (L) 11/23/2015   CL 105 11/23/2015   CO2 28 11/23/2015   GLUCOSE 101 (H) 11/23/2015   BUN 23 (H) 08/06/2016   CREATININE 0.88 08/06/2016   CALCIUM 9.7 11/23/2015   PROT 7.5 11/23/2015   ALBUMIN 4.8 11/23/2015   AST 22 11/23/2015   ALT 24 11/23/2015    ALKPHOS 88 11/23/2015   BILITOT 0.8 11/23/2015   GFRNONAA >60 08/06/2016   GFRAA >60 08/06/2016    Lab Results  Component Value Date   WBC 5.5 06/15/2018   HGB 14.5 06/15/2018   HCT 42.7 06/15/2018   MCV 93.7 06/15/2018   PLT 168 06/15/2018     STUDIES: No results found.  ASSESSMENT: Clinical stage IA ER/PR positive, HER-2 negative invasive carcinoma of the upper outer quadrant of the right breast.  Oncotype DX score 6, low risk.  PLAN:    Clinical stage IA ER/PR positive, HER-2 negative invasive carcinoma of the upper outer quadrant of the right breast: Patient underwent lumpectomy on Apr 01, 2018 confirming the stage of disease.  She had an Oncotype DX score of 6 which confers a low risk therefore patient did not require adjuvant chemotherapy.  She completed adjuvant XRT in approximately August 2019.  Patient has now completed 5 years of letrozole.  Her most recent mammogram on April 27, 2023 was reported as BI-RADS 1.  Repeat in June 2025.  After discussion with the patient, is agreed upon that no further follow-up is necessary and her yearly screening mammograms can be ordered by primary care.  Please refer patient back if there are any questions or concerns.   Osteopenia: Patient most recent bone mineral density on April 27, 2023 reported T-score of -2.1 but is improved from 1 year prior.  Continue calcium and vitamin D supplementation.  Additional bone marrow densities can be ordered by primary care.  I provided 20 minutes of face-to-face video visit time during this encounter which included chart review, counseling, and coordination of care as documented above.   Patient expressed understanding and was in agreement with this plan. She also understands that She can call clinic at any time with any questions, concerns, or complaints.    Cancer Staging  Primary cancer of upper outer quadrant of right female breast [facility]) Staging form: Breast, AJCC 8th Edition - Clinical stage from  03/26/2018: Stage IA (cT1b, cN0, cM0, G2, ER+, PR+, HER2-) - Signed by Jeralyn Ruths, MD on 03/26/2018 Histologic grading system: 3 grade system Laterality: Right   Jeralyn Ruths, MD   07/13/2023 1:04 PM

## 2023-08-03 ENCOUNTER — Other Ambulatory Visit: Payer: Self-pay | Admitting: Oncology

## 2023-08-03 DIAGNOSIS — C50911 Malignant neoplasm of unspecified site of right female breast: Secondary | ICD-10-CM

## 2023-12-25 ENCOUNTER — Other Ambulatory Visit: Payer: Self-pay | Admitting: Internal Medicine

## 2023-12-25 DIAGNOSIS — E7800 Pure hypercholesterolemia, unspecified: Secondary | ICD-10-CM | POA: Diagnosis not present

## 2023-12-25 DIAGNOSIS — E66812 Obesity, class 2: Secondary | ICD-10-CM | POA: Diagnosis not present

## 2023-12-25 DIAGNOSIS — E039 Hypothyroidism, unspecified: Secondary | ICD-10-CM | POA: Diagnosis not present

## 2023-12-25 DIAGNOSIS — Z0000 Encounter for general adult medical examination without abnormal findings: Secondary | ICD-10-CM | POA: Diagnosis not present

## 2023-12-25 DIAGNOSIS — I82402 Acute embolism and thrombosis of unspecified deep veins of left lower extremity: Secondary | ICD-10-CM | POA: Diagnosis not present

## 2023-12-25 DIAGNOSIS — E785 Hyperlipidemia, unspecified: Secondary | ICD-10-CM | POA: Diagnosis not present

## 2023-12-25 DIAGNOSIS — R8290 Unspecified abnormal findings in urine: Secondary | ICD-10-CM | POA: Diagnosis not present

## 2023-12-25 DIAGNOSIS — Z1231 Encounter for screening mammogram for malignant neoplasm of breast: Secondary | ICD-10-CM | POA: Diagnosis not present

## 2023-12-25 DIAGNOSIS — I10 Essential (primary) hypertension: Secondary | ICD-10-CM | POA: Diagnosis not present

## 2023-12-25 DIAGNOSIS — Z79899 Other long term (current) drug therapy: Secondary | ICD-10-CM | POA: Diagnosis not present

## 2023-12-25 DIAGNOSIS — R7303 Prediabetes: Secondary | ICD-10-CM | POA: Diagnosis not present

## 2024-01-21 ENCOUNTER — Other Ambulatory Visit (INDEPENDENT_AMBULATORY_CARE_PROVIDER_SITE_OTHER): Payer: Self-pay | Admitting: Vascular Surgery

## 2024-01-21 DIAGNOSIS — I872 Venous insufficiency (chronic) (peripheral): Secondary | ICD-10-CM

## 2024-01-21 DIAGNOSIS — Z95828 Presence of other vascular implants and grafts: Secondary | ICD-10-CM

## 2024-01-21 DIAGNOSIS — I82412 Acute embolism and thrombosis of left femoral vein: Secondary | ICD-10-CM

## 2024-01-24 NOTE — Progress Notes (Unsigned)
 MRN : [id number]  Susan Myers is a 73 y.o. (1951/08/29) female who presents with chief complaint of legs hurt and swell.  History of Present Illness:  The patient returns to the office for followup regarding her IVC filter.  She is status post angiogram with attempted IVC filter removal on 12/16/2016.  Hematuria has not recurred and she is now back on her Xarelto.   Swelling of her left leg remains stable and "not much" at this time.   She is seeing Dr. Orlie Dakin for treatment of her breast cancer.  There have been no significant changes to the patient's overall health care.  She has been doing well without any significant change in her leg swelling.   The patient denies amaurosis fugax or recent TIA symptoms. There are no recent neurological changes noted. The patient denies history of DVT, PE or superficial thrombophlebitis. The patient denies recent episodes of angina or shortness of breath.     Duplex ultrasound of the IVC done today is negative for clot, normal flow pattern is identified. No significant change compared to previous study.   No outpatient medications have been marked as taking for the 01/25/24 encounter (Appointment) with Gilda Crease, Latina Craver, MD.    Past Medical History:  Diagnosis Date   Actinic keratosis 01/10/2019   mid vertex scalp   Breast cancer (HCC) 2019   invasive mammary carcinoma, lumpectomy and rad tx   Complication of anesthesia    Delusional disorder (HCC)    DVT (deep venous thrombosis) (HCC) 06/2016   Dysplastic nevus 07/24/2020   R med calf - mod   GERD (gastroesophageal reflux disease)    History of kidney stones    Hx of blood clots    Hypercholesterolemia    Hypothyroidism    Kidney stones    Personal history of radiation therapy 2019   right breast cancer   PONV (postoperative nausea and vomiting)    Thyroid disease     Past Surgical History:  Procedure Laterality Date   ABDOMINAL HYSTERECTOMY     APPENDECTOMY      BREAST BIOPSY Right 03/23/2018   Affirm Bx- invasive mammary carcinoma   BREAST CYST ASPIRATION Bilateral 1990   BREAST LUMPECTOMY Right 2019   invasive mammary carcinoma, clear surgical margins   COLONOSCOPY     IVC FILTER INSERTION     PARTIAL MASTECTOMY WITH NEEDLE LOCALIZATION Right 04/01/2018   Procedure: PARTIAL MASTECTOMY WITH NEEDLE LOCALIZATION;  Surgeon: Carolan Shiver, MD;  Location: [facility];  Service: General;  Laterality: Right;   PERIPHERAL VASCULAR CATHETERIZATION N/A 08/06/2016   Procedure: IVC Filter Insertion;  Surgeon: Renford Dills, MD;  Location: [facility];  Service: Cardiovascular;  Laterality: N/A;   PERIPHERAL VASCULAR CATHETERIZATION Left 08/06/2016   Procedure: Lower Extremity Venography;  Surgeon: Renford Dills, MD;  Location: [facility];  Service: Cardiovascular;  Laterality: Left;   PERIPHERAL VASCULAR CATHETERIZATION  08/06/2016   Procedure: Lower Extremity Intervention;  Surgeon: Renford Dills, MD;  Location: [facility];  Service: Cardiovascular;;   PERIPHERAL VASCULAR CATHETERIZATION N/A 12/16/2016   Procedure: IVC Filter Removal;  Surgeon: Renford Dills, MD;  Location: [facility];  Service: Cardiovascular;  Laterality: N/A;   SENTINEL NODE BIOPSY Right 04/01/2018   Procedure: SENTINEL NODE BIOPSY;  Surgeon: Carolan Shiver, MD;  Location: [facility];  Service: General;  Laterality: Right;   TONSILLECTOMY      Social History  Social History   Tobacco Use   Smoking status: Never   Smokeless tobacco: Never  Vaping Use   Vaping status: Never Used  Substance Use Topics   Alcohol use: No   Drug use: No    Family History Family History  Problem Relation Age of Onset   Breast cancer Neg Hx    Prostate cancer Neg Hx    Kidney cancer Neg Hx     Allergies  Allergen Reactions   Levaquin [Levofloxacin In D5w] Hives   Macrolides And Ketolides Other (See Comments)    WHELPS WHELPS WHELPS    Penicillins Itching    Can take amoxicillin  Has patient had a PCN reaction causing immediate rash, facial/tongue/throat swelling, SOB or lightheadedness with hypotension:No Has patient had a PCN reaction causing severe rash involving mucus membranes or skin necrosis:unsure Has patient had a PCN reaction that required hospitalization:No Has patient had a PCN reaction occurring within the last 10 years: No If all of the above answers are "NO", then may proceed with Cephalosporin use.   Sulfa Antibiotics Hives     REVIEW OF SYSTEMS (Negative unless checked)  Constitutional: [] Weight loss  [] Fever  [] Chills Cardiac: [] Chest pain   [] Chest pressure   [] Palpitations   [] Shortness of breath when laying flat   [] Shortness of breath with exertion. Vascular:  [] Pain in legs with walking   [x] Pain in legs at rest  [] History of DVT   [] Phlebitis   [x] Swelling in legs   [] Varicose veins   [] Non-healing ulcers Pulmonary:   [] Uses home oxygen   [] Productive cough   [] Hemoptysis   [] Wheeze  [] COPD   [] Asthma Neurologic:  [] Dizziness   [] Seizures   [] History of stroke   [] History of TIA  [] Aphasia   [] Vissual changes   [] Weakness or numbness in arm   [] Weakness or numbness in leg Musculoskeletal:   [] Joint swelling   [] Joint pain   [] Low back pain Hematologic:  [] Easy bruising  [] Easy bleeding   [] Hypercoagulable state   [] Anemic Gastrointestinal:  [] Diarrhea   [] Vomiting  [] Gastroesophageal reflux/heartburn   [] Difficulty swallowing. Genitourinary:  [] Chronic kidney disease   [] Difficult urination  [] Frequent urination   [] Blood in urine Skin:  [] Rashes   [] Ulcers  Psychological:  [] History of anxiety   []  History of major depression.  Physical Examination  There were no vitals filed for this visit. There is no height or weight on file to calculate BMI. Gen: WD/WN, NAD Head: [place]/AT, No temporalis wasting.  Ear/Nose/Throat: Hearing grossly intact, nares w/o erythema or drainage, pinna without  lesions Eyes: PER, EOMI, sclera nonicteric.  Neck: Supple, no gross masses.  No JVD.  Pulmonary:  Good air movement, no audible wheezing, no use of accessory muscles.  Cardiac: RRR, precordium not hyperdynamic. Vascular:  scattered varicosities present bilaterally.  Moderate venous stasis changes to the legs bilaterally.  2+ soft pitting edema. CEAP [id number]   Vessel Right Left  Radial Palpable Palpable  Gastrointestinal: soft, non-distended. No guarding/no peritoneal signs.  Musculoskeletal: M/S 5/5 throughout.  No deformity.  Neurologic: CN 2-12 intact. Pain and light touch intact in extremities.  Symmetrical.  Speech is fluent. Motor exam as listed above. Psychiatric: Judgment intact, Mood & affect appropriate for pt's clinical situation. Dermatologic: Venous rashes no ulcers noted.  No changes consistent with cellulitis. Lymph : No lichenification or skin changes of chronic lymphedema.  CBC Lab Results  Component Value Date   WBC 5.5 06/15/2018   HGB 14.5 06/15/2018   HCT 42.7  06/15/2018   MCV 93.7 06/15/2018   PLT 168 06/15/2018    BMET    Component Value Date/Time   NA 143 11/23/2015 2103   K 3.4 (L) 11/23/2015 2103   CL 105 11/23/2015 2103   CO2 28 11/23/2015 2103   GLUCOSE 101 (H) 11/23/2015 2103   BUN 23 (H) 08/06/2016 1426   CREATININE 0.88 08/06/2016 1426   CALCIUM 9.7 11/23/2015 2103   GFRNONAA >60 08/06/2016 1426   GFRAA >60 08/06/2016 1426   CrCl cannot be calculated (Patient's most recent lab result is older than the maximum 21 days allowed.).  COAG Lab Results  Component Value Date   INR 1.33 08/06/2016    Radiology No results found.   Assessment/Plan There are no diagnoses linked to this encounter.   Levora Dredge, MD  01/24/2024 12:48 PM

## 2024-01-25 ENCOUNTER — Encounter (INDEPENDENT_AMBULATORY_CARE_PROVIDER_SITE_OTHER): Payer: Self-pay | Admitting: Vascular Surgery

## 2024-01-25 ENCOUNTER — Ambulatory Visit (INDEPENDENT_AMBULATORY_CARE_PROVIDER_SITE_OTHER): Payer: PPO | Admitting: Vascular Surgery

## 2024-01-25 ENCOUNTER — Ambulatory Visit (INDEPENDENT_AMBULATORY_CARE_PROVIDER_SITE_OTHER): Payer: PPO

## 2024-01-25 VITALS — BP 143/88 | HR 81 | Resp 16 | Wt 210.0 lb

## 2024-01-25 DIAGNOSIS — I872 Venous insufficiency (chronic) (peripheral): Secondary | ICD-10-CM

## 2024-01-25 DIAGNOSIS — I82512 Chronic embolism and thrombosis of left femoral vein: Secondary | ICD-10-CM | POA: Diagnosis not present

## 2024-01-25 DIAGNOSIS — E7800 Pure hypercholesterolemia, unspecified: Secondary | ICD-10-CM

## 2024-01-25 DIAGNOSIS — I10 Essential (primary) hypertension: Secondary | ICD-10-CM

## 2024-01-25 DIAGNOSIS — I890 Lymphedema, not elsewhere classified: Secondary | ICD-10-CM | POA: Diagnosis not present

## 2024-01-25 DIAGNOSIS — I82412 Acute embolism and thrombosis of left femoral vein: Secondary | ICD-10-CM | POA: Diagnosis not present

## 2024-01-25 DIAGNOSIS — Z95828 Presence of other vascular implants and grafts: Secondary | ICD-10-CM | POA: Diagnosis not present

## 2024-04-12 ENCOUNTER — Encounter (INDEPENDENT_AMBULATORY_CARE_PROVIDER_SITE_OTHER): Payer: Self-pay

## 2024-04-27 ENCOUNTER — Ambulatory Visit
Admission: RE | Admit: 2024-04-27 | Discharge: 2024-04-27 | Disposition: A | Discharge status: Home / Self Care | Payer: PPO | Source: Ambulatory Visit | Attending: Internal Medicine | Admitting: Internal Medicine

## 2024-04-27 DIAGNOSIS — Z1231 Encounter for screening mammogram for malignant neoplasm of breast: Secondary | ICD-10-CM | POA: Diagnosis not present

## 2024-05-02 DIAGNOSIS — H11003 Unspecified pterygium of eye, bilateral: Secondary | ICD-10-CM | POA: Diagnosis not present

## 2024-05-02 DIAGNOSIS — H1132 Conjunctival hemorrhage, left eye: Secondary | ICD-10-CM | POA: Diagnosis not present

## 2024-05-02 DIAGNOSIS — D3131 Benign neoplasm of right choroid: Secondary | ICD-10-CM | POA: Diagnosis not present

## 2024-05-02 DIAGNOSIS — H04123 Dry eye syndrome of bilateral lacrimal glands: Secondary | ICD-10-CM | POA: Diagnosis not present

## 2024-05-02 DIAGNOSIS — H2513 Age-related nuclear cataract, bilateral: Secondary | ICD-10-CM | POA: Diagnosis not present

## 2024-06-16 DIAGNOSIS — R8290 Unspecified abnormal findings in urine: Secondary | ICD-10-CM | POA: Diagnosis not present

## 2024-06-16 DIAGNOSIS — E039 Hypothyroidism, unspecified: Secondary | ICD-10-CM | POA: Diagnosis not present

## 2024-06-16 DIAGNOSIS — R7303 Prediabetes: Secondary | ICD-10-CM | POA: Diagnosis not present

## 2024-06-16 DIAGNOSIS — E66812 Obesity, class 2: Secondary | ICD-10-CM | POA: Diagnosis not present

## 2024-06-16 DIAGNOSIS — Z1211 Encounter for screening for malignant neoplasm of colon: Secondary | ICD-10-CM | POA: Diagnosis not present

## 2024-06-16 DIAGNOSIS — E7800 Pure hypercholesterolemia, unspecified: Secondary | ICD-10-CM | POA: Diagnosis not present

## 2024-06-16 DIAGNOSIS — I10 Essential (primary) hypertension: Secondary | ICD-10-CM | POA: Diagnosis not present

## 2024-06-16 DIAGNOSIS — M810 Age-related osteoporosis without current pathological fracture: Secondary | ICD-10-CM | POA: Diagnosis not present

## 2024-06-16 DIAGNOSIS — I82402 Acute embolism and thrombosis of unspecified deep veins of left lower extremity: Secondary | ICD-10-CM | POA: Diagnosis not present

## 2024-06-16 DIAGNOSIS — Z79899 Other long term (current) drug therapy: Secondary | ICD-10-CM | POA: Diagnosis not present

## 2024-06-27 DIAGNOSIS — Z1211 Encounter for screening for malignant neoplasm of colon: Secondary | ICD-10-CM | POA: Diagnosis not present

## 2024-08-02 ENCOUNTER — Emergency Department
Admission: EM | Admit: 2024-08-02 | Discharge: 2024-08-02 | Disposition: A | Discharge status: Home / Self Care | Attending: Emergency Medicine | Admitting: Emergency Medicine

## 2024-08-02 ENCOUNTER — Emergency Department

## 2024-08-02 ENCOUNTER — Encounter: Payer: Self-pay | Admitting: Physician Assistant

## 2024-08-02 ENCOUNTER — Ambulatory Visit: Admission: EM | Admit: 2024-08-02 | Discharge: 2024-08-02

## 2024-08-02 ENCOUNTER — Ambulatory Visit (INDEPENDENT_AMBULATORY_CARE_PROVIDER_SITE_OTHER)

## 2024-08-02 ENCOUNTER — Other Ambulatory Visit: Payer: Self-pay

## 2024-08-02 DIAGNOSIS — J90 Pleural effusion, not elsewhere classified: Secondary | ICD-10-CM | POA: Diagnosis not present

## 2024-08-02 DIAGNOSIS — E039 Hypothyroidism, unspecified: Secondary | ICD-10-CM | POA: Diagnosis not present

## 2024-08-02 DIAGNOSIS — I499 Cardiac arrhythmia, unspecified: Secondary | ICD-10-CM | POA: Diagnosis not present

## 2024-08-02 DIAGNOSIS — R918 Other nonspecific abnormal finding of lung field: Secondary | ICD-10-CM | POA: Diagnosis not present

## 2024-08-02 DIAGNOSIS — R051 Acute cough: Secondary | ICD-10-CM

## 2024-08-02 DIAGNOSIS — I480 Paroxysmal atrial fibrillation: Secondary | ICD-10-CM | POA: Diagnosis not present

## 2024-08-02 DIAGNOSIS — J810 Acute pulmonary edema: Secondary | ICD-10-CM

## 2024-08-02 DIAGNOSIS — R000 Tachycardia, unspecified: Secondary | ICD-10-CM | POA: Diagnosis not present

## 2024-08-02 DIAGNOSIS — Z853 Personal history of malignant neoplasm of breast: Secondary | ICD-10-CM | POA: Diagnosis not present

## 2024-08-02 DIAGNOSIS — R9431 Abnormal electrocardiogram [ECG] [EKG]: Secondary | ICD-10-CM | POA: Diagnosis not present

## 2024-08-02 DIAGNOSIS — Z7901 Long term (current) use of anticoagulants: Secondary | ICD-10-CM | POA: Diagnosis not present

## 2024-08-02 DIAGNOSIS — I2510 Atherosclerotic heart disease of native coronary artery without angina pectoris: Secondary | ICD-10-CM | POA: Diagnosis not present

## 2024-08-02 DIAGNOSIS — I10 Essential (primary) hypertension: Secondary | ICD-10-CM | POA: Insufficient documentation

## 2024-08-02 DIAGNOSIS — I4891 Unspecified atrial fibrillation: Secondary | ICD-10-CM | POA: Diagnosis present

## 2024-08-02 DIAGNOSIS — J811 Chronic pulmonary edema: Secondary | ICD-10-CM | POA: Diagnosis not present

## 2024-08-02 DIAGNOSIS — R052 Subacute cough: Secondary | ICD-10-CM

## 2024-08-02 DIAGNOSIS — N200 Calculus of kidney: Secondary | ICD-10-CM | POA: Diagnosis not present

## 2024-08-02 HISTORY — DX: Prediabetes: R73.03

## 2024-08-02 HISTORY — DX: Obesity, unspecified: E66.9

## 2024-08-02 HISTORY — DX: Essential (primary) hypertension: I10

## 2024-08-02 LAB — BASIC METABOLIC PANEL WITH GFR
Anion gap: 10 (ref 5–15)
BUN: 19 mg/dL (ref 8–23)
CO2: 26 mmol/L (ref 22–32)
Calcium: 9.4 mg/dL (ref 8.9–10.3)
Chloride: 106 mmol/L (ref 98–111)
Creatinine, Ser: 1.07 mg/dL — ABNORMAL HIGH (ref 0.44–1.00)
GFR, Estimated: 55 mL/min — ABNORMAL LOW (ref >60)
Glucose, Bld: 118 mg/dL — ABNORMAL HIGH (ref 70–99)
Potassium: 3.9 mmol/L (ref 3.5–5.1)
Sodium: 142 mmol/L (ref 135–145)

## 2024-08-02 LAB — TROPONIN I (HIGH SENSITIVITY): Troponin I (High Sensitivity): 8 ng/L (ref <18)

## 2024-08-02 LAB — CBC
HCT: 37.7 % (ref 36.0–46.0)
Hemoglobin: 12.4 g/dL (ref 12.0–15.0)
MCH: 31.3 pg (ref 26.0–34.0)
MCHC: 32.9 g/dL (ref 30.0–36.0)
MCV: 95.2 fL (ref 80.0–100.0)
Platelets: 168 K/uL (ref 150–400)
RBC: 3.96 MIL/uL (ref 3.87–5.11)
RDW: 14.7 % (ref 11.5–15.5)
WBC: 9.3 K/uL (ref 4.0–10.5)
nRBC: 0 % (ref 0.0–0.2)

## 2024-08-02 LAB — MAGNESIUM: Magnesium: 2 mg/dL (ref 1.7–2.4)

## 2024-08-02 LAB — HEPATIC FUNCTION PANEL
ALT: 22 U/L (ref 0–44)
AST: 26 U/L (ref 15–41)
Albumin: 3.8 g/dL (ref 3.5–5.0)
Alkaline Phosphatase: 57 U/L (ref 38–126)
Bilirubin, Direct: 0.2 mg/dL (ref 0.0–0.2)
Indirect Bilirubin: 0.4 mg/dL (ref 0.3–0.9)
Total Bilirubin: 0.6 mg/dL (ref 0.0–1.2)
Total Protein: 6.6 g/dL (ref 6.5–8.1)

## 2024-08-02 LAB — TSH: TSH: 4.732 u[IU]/mL — ABNORMAL HIGH (ref 0.350–4.500)

## 2024-08-02 MED ORDER — DILTIAZEM HCL 25 MG/5ML IV SOLN
25.0000 mg | Freq: Once | INTRAVENOUS | Status: AC
  Administered 2024-08-02: 25 mg via INTRAVENOUS
  Filled 2024-08-02: qty 5

## 2024-08-02 MED ORDER — DILTIAZEM HCL 25 MG/5ML IV SOLN
20.0000 mg | Freq: Once | INTRAVENOUS | Status: AC
  Administered 2024-08-02: 20 mg via INTRAVENOUS
  Filled 2024-08-02: qty 5

## 2024-08-02 MED ORDER — IOHEXOL 350 MG/ML SOLN
75.0000 mL | Freq: Once | INTRAVENOUS | Status: AC | PRN
  Administered 2024-08-02: 75 mL via INTRAVENOUS

## 2024-08-02 MED ORDER — DILTIAZEM HCL ER COATED BEADS 120 MG PO CP24
120.0000 mg | ORAL_CAPSULE | Freq: Once | ORAL | Status: AC
  Administered 2024-08-02: 120 mg via ORAL
  Filled 2024-08-02: qty 1

## 2024-08-02 MED ORDER — DILTIAZEM HCL ER COATED BEADS 120 MG PO CP24: 120.0000 mg | ORAL_CAPSULE | Freq: Every day | ORAL | 0 refills | Status: DC

## 2024-08-02 NOTE — ED Notes (Signed)
 Patient is being discharged from the Urgent Care and sent to the Emergency Department via POV . Per Lyle Host PA, patient is in need of higher level of care due to cardiac arrhythmia. Patient is aware and verbalizes understanding of plan of care.  Vitals:   08/02/24 1310  BP: (!) 122/90  Pulse: 90  Resp: 18  Temp: 99.5 F (37.5 C)  SpO2: 94%

## 2024-08-02 NOTE — ED Provider Notes (Signed)
 [facility] Provider Note    Event Date/Time   First MD Initiated Contact with Patient 08/02/24 1501     (approximate)   History   Chief Complaint: afib w/ rvr   HPI  Susan Myers is a 73 y.o. female with a history of multiple DVTs on Xarelto  chronically, hypertension who comes ED from urgent care due to rapid heart rate.  Patient reports that she and her spouse both had a flulike illness about 4 weeks ago, and she has had persistent nonproductive cough since then.  No chest pain shortness of breath or fever.  Denies exertional symptoms or pleuritic symptoms.  She has been taking multiple decongestants over the past few weeks to try and alleviate the symptoms without effect.        Past Medical History:  Diagnosis Date   Actinic keratosis 01/10/2019   mid vertex scalp   Breast cancer (HCC) 2019   invasive mammary carcinoma, lumpectomy and rad tx   Complication of anesthesia    Delusional disorder (HCC)    DVT (deep venous thrombosis) (HCC) 06/2016   Dysplastic nevus 07/24/2020   R med calf - mod   GERD (gastroesophageal reflux disease)    History of kidney stones    Hx of blood clots    Hypercholesterolemia    Hypertension    Hypothyroidism    Kidney stones    Obesity    Personal history of radiation therapy 2019   right breast cancer   PONV (postoperative nausea and vomiting)    Prediabetes    Thyroid  disease     Current Outpatient Rx   Order #: [id number] Class: Historical Med   Order #: [id number] Class: Normal   Order #: [id number] Class: Historical Med   Order #: [id number] Class: Historical Med   Order #: [id number] Class: Historical Med   Order #: [id number] Class: Normal   Order #: [id number] Class: Historical Med   Order #: [id number] Class: Normal   Order #: [id number] Class: Historical Med   Order #: [id number] Class: Historical Med   Order #: [id number] Class: Normal   Order #: [id number] Class: Historical Med   Order #: [id number] Class:  Historical Med   Order #: [id number] Class: Historical Med   Order #: [id number] Class: Historical Med    Past Surgical History:  Procedure Laterality Date   ABDOMINAL HYSTERECTOMY     APPENDECTOMY     BREAST BIOPSY Right 03/23/2018   Affirm Bx- invasive mammary carcinoma   BREAST CYST ASPIRATION Bilateral 1990   BREAST LUMPECTOMY Right 2019   invasive mammary carcinoma, clear surgical margins   COLONOSCOPY     IVC FILTER INSERTION     PARTIAL MASTECTOMY WITH NEEDLE LOCALIZATION Right 04/01/2018   Procedure: PARTIAL MASTECTOMY WITH NEEDLE LOCALIZATION;  Surgeon: Rodolph Romano, MD;  Location: [facility];  Service: General;  Laterality: Right;   PERIPHERAL VASCULAR CATHETERIZATION N/A 08/06/2016   Procedure: IVC Filter Insertion;  Surgeon: Cordella KANDICE Shawl, MD;  Location: [facility];  Service: Cardiovascular;  Laterality: N/A;   PERIPHERAL VASCULAR CATHETERIZATION Left 08/06/2016   Procedure: Lower Extremity Venography;  Surgeon: Cordella KANDICE Shawl, MD;  Location: [facility];  Service: Cardiovascular;  Laterality: Left;   PERIPHERAL VASCULAR CATHETERIZATION  08/06/2016   Procedure: Lower Extremity Intervention;  Surgeon: Cordella KANDICE Shawl, MD;  Location: [facility];  Service: Cardiovascular;;   PERIPHERAL VASCULAR CATHETERIZATION N/A 12/16/2016   Procedure: IVC Filter Removal;  Surgeon: Cordella KANDICE Shawl, MD;  Location: [facility];  Service: Cardiovascular;  Laterality: N/A;   SENTINEL NODE BIOPSY Right 04/01/2018   Procedure: SENTINEL NODE BIOPSY;  Surgeon: Rodolph Romano, MD;  Location: [facility];  Service: General;  Laterality: Right;   TONSILLECTOMY      Physical Exam   Triage Vital Signs: ED Triage Vitals  Encounter Vitals Group     BP 08/02/24 1514 (!) 132/101     Girls Systolic BP Percentile --      Girls Diastolic BP Percentile --      Boys Systolic BP Percentile --      Boys Diastolic BP Percentile --      Pulse Rate 08/02/24  1514 (!) 168     Resp 08/02/24 1514 18     Temp 08/02/24 1514 98 F (36.7 C)     Temp Source 08/02/24 1514 Oral     SpO2 08/02/24 1514 97 %     Weight 08/02/24 1505 200 lb (90.7 kg)     Height 08/02/24 1505 5' 3 (1.6 m)     Head Circumference --      Peak Flow --      Pain Score 08/02/24 1500 0     Pain Loc --      Pain Education --      Exclude from Growth Chart --     Most recent vital signs: Vitals:   08/02/24 1630 08/02/24 1700  BP: 119/86 (!) 124/91  Pulse: 86 (!) 161  Resp: (!) 26 (!) 26  Temp:    SpO2: 93% 98%    General: Awake, no distress.  CV:  Good peripheral perfusion.  Irregular rhythm, heart rate 150-170.  A-fib on the monitor Resp:  Normal effort.  Clear to auscultation bilaterally Abd:  No distention.  Soft nontender Other:  Trace pitting edema bilateral lower extremities.  No calf tenderness.  Symmetric calf circumference   ED Results / Procedures / Treatments   Labs (all labs ordered are listed, but only abnormal results are displayed) Labs Reviewed  BASIC METABOLIC PANEL WITH GFR - Abnormal; Notable for the following components:      Result Value   Glucose, Bld 118 (*)    Creatinine, Ser 1.07 (*)    GFR, Estimated 55 (*)    All other components within normal limits  TSH - Abnormal; Notable for the following components:   TSH 4.732 (*)    All other components within normal limits  CBC  HEPATIC FUNCTION PANEL  MAGNESIUM   TROPONIN I (HIGH SENSITIVITY)  TROPONIN I (HIGH SENSITIVITY)     EKG Interpreted by me Atrial fibrillation rate of 167.  Normal axis and intervals.  No acute ischemic changes.  Multiple couplets of PVCs on the rhythm strip.   RADIOLOGY Chest x-ray interpreted by me, unremarkable.  Radiology report reviewed.  CT angiogram chest negative for PE or other acute findings   PROCEDURES:  .Critical Care  Performed by: Viviann Pastor, MD Authorized by: Viviann Pastor, MD   Critical care provider statement:     Critical care time (minutes):  35   Critical care time was exclusive of:  Separately billable procedures and treating other patients   Critical care was necessary to treat or prevent imminent or life-threatening deterioration of the following conditions:  Cardiac failure   Critical care was time spent personally by me on the following activities:  Development of treatment plan with patient or surrogate, discussions with consultants, evaluation of patient's response to treatment, examination of patient, obtaining history from patient or surrogate, ordering and performing  treatments and interventions, ordering and review of laboratory studies, ordering and review of radiographic studies, pulse oximetry, re-evaluation of patient's condition and review of old charts    MEDICATIONS ORDERED IN ED: Medications  diltiazem  (CARDIZEM  CD) 24 hr capsule 120 mg (has no administration in time range)  diltiazem  (CARDIZEM ) injection 20 mg (20 mg Intravenous Given 08/02/24 1617)  diltiazem  (CARDIZEM ) injection 25 mg (25 mg Intravenous Given 08/02/24 1703)  iohexol  (OMNIPAQUE ) 350 MG/ML injection 75 mL (75 mLs Intravenous Contrast Given 08/02/24 1727)     IMPRESSION / MDM / ASSESSMENT AND PLAN / ED COURSE  I reviewed the triage vital signs and the nursing notes.  DDx: Electrolyte derangement, hypothyroidism, paroxysmal atrial fibrillation, NSTEMI, AKI  Patient's presentation is most consistent with acute presentation with potential threat to life or bodily function.  Patient presents with atrial fibrillation, possibly provoked by decongestant use due to 4 weeks of cough.  No chest pain, no shortness of breath.  With her history of multiple DVTs, will need to obtain CT to rule out PE despite her Xarelto  use and IVC filter.  Will give IV Dilt for A-fib rate control.   Clinical Course as of 08/02/24 RONOLD  Tue Aug 02, 2024  1627 No improvement with 20mg  IVP dilt. Will give repeat 25mg  dose [PS]    Clinical Course  User Index [PS] Viviann Pastor, MD    ----------------------------------------- 6:33 PM on 08/02/2024 ----------------------------------------- CT negative.  Patient now in sinus rhythm with a rate of 70, remains asymptomatic.  Vital signs normal.  Will start on diltiazem  ER tablet for now until she can follow-up with cardiology.  She will continue her Xarelto .   FINAL CLINICAL IMPRESSION(S) / ED DIAGNOSES   Final diagnoses:  PAF (paroxysmal atrial fibrillation) (HCC)     Rx / DC Orders   ED Discharge Orders          Ordered    Ambulatory referral to Cardiology       Comments: If you have not heard from the Cardiology office within the next 72 hours please call [phone].   08/02/24 1832    diltiazem  (CARDIZEM  CD) 120 MG 24 hr capsule  Daily        08/02/24 1833             Note:  This document was prepared using Dragon voice recognition software and may include unintentional dictation errors.   Viviann Pastor, MD 08/02/24 RONOLD

## 2024-08-02 NOTE — ED Provider Notes (Signed)
 [facility]    CSN: [id number] Arrival date & time: 08/02/24  1258      History   Chief Complaint Chief Complaint  Patient presents with   Cough    HPI Susan Myers is a 73 y.o. female with history of thyroid  disease, hyperlipidemia, hypertension, chronic DVT--on anticoagulation, mitral valve insufficiency, and breast cancer (2019).   Patient presents today for dry cough x 4 weeks. Symptoms started off like a cold. Upper respiratory symptoms resolved but cough has been persistent. Her husband was ill when she first became ill. Denies fever, fatigue, nasal congestion, sinus pressure, sore throat, chest pain, dizziness, and shortness of breath. Taking benzonatate  without relief.  No history of asthma, COPD or tobacco abuse. No history of atrial fibrillation or other arrhythmia.   HPI  Past Medical History:  Diagnosis Date   Actinic keratosis 01/10/2019   mid vertex scalp   Breast cancer (HCC) 2019   invasive mammary carcinoma, lumpectomy and rad tx   Complication of anesthesia    Delusional disorder (HCC)    DVT (deep venous thrombosis) (HCC) 06/2016   Dysplastic nevus 07/24/2020   R med calf - mod   GERD (gastroesophageal reflux disease)    History of kidney stones    Hx of blood clots    Hypercholesterolemia    Hypertension    Hypothyroidism    Kidney stones    Obesity    Personal history of radiation therapy 2019   right breast cancer   PONV (postoperative nausea and vomiting)    Prediabetes    Thyroid  disease     Patient Active Problem List   Diagnosis Date Noted   Fibrositis 05/08/2020   Chronic fatigue 04/26/2019   Primary cancer of upper outer quadrant of right female breast (HCC) 03/26/2018   Breast cancer (HCC) 03/24/2018   Lymphedema 11/13/2016   Chronic venous insufficiency 11/13/2016   Chronic deep vein thrombosis (DVT) of femoral vein of left lower extremity (HCC) 09/05/2016   Chronic deep vein thrombosis (DVT) (HCC) 08/06/2016    Hypercholesteremia 11/28/2015   HTN, goal below 140/80 11/27/2015   UTI (urinary tract infection) 11/27/2015   Delusional disorder (HCC) 11/24/2015   Hypothyroidism 11/24/2015    Past Surgical History:  Procedure Laterality Date   ABDOMINAL HYSTERECTOMY     APPENDECTOMY     BREAST BIOPSY Right 03/23/2018   Affirm Bx- invasive mammary carcinoma   BREAST CYST ASPIRATION Bilateral 1990   BREAST LUMPECTOMY Right 2019   invasive mammary carcinoma, clear surgical margins   COLONOSCOPY     IVC FILTER INSERTION     PARTIAL MASTECTOMY WITH NEEDLE LOCALIZATION Right 04/01/2018   Procedure: PARTIAL MASTECTOMY WITH NEEDLE LOCALIZATION;  Surgeon: Rodolph Romano, MD;  Location: [facility];  Service: General;  Laterality: Right;   PERIPHERAL VASCULAR CATHETERIZATION N/A 08/06/2016   Procedure: IVC Filter Insertion;  Surgeon: Cordella KANDICE Shawl, MD;  Location: [facility];  Service: Cardiovascular;  Laterality: N/A;   PERIPHERAL VASCULAR CATHETERIZATION Left 08/06/2016   Procedure: Lower Extremity Venography;  Surgeon: Cordella KANDICE Shawl, MD;  Location: [facility];  Service: Cardiovascular;  Laterality: Left;   PERIPHERAL VASCULAR CATHETERIZATION  08/06/2016   Procedure: Lower Extremity Intervention;  Surgeon: Cordella KANDICE Shawl, MD;  Location: [facility];  Service: Cardiovascular;;   PERIPHERAL VASCULAR CATHETERIZATION N/A 12/16/2016   Procedure: IVC Filter Removal;  Surgeon: Cordella KANDICE Shawl, MD;  Location: [facility];  Service: Cardiovascular;  Laterality: N/A;  SENTINEL NODE BIOPSY Right 04/01/2018   Procedure: SENTINEL NODE BIOPSY;  Surgeon: Rodolph Romano, MD;  Location: [facility];  Service: General;  Laterality: Right;   TONSILLECTOMY      OB History   No obstetric history on file.      Home Medications    Prior to Admission medications   Medication Sig Start Date End Date Taking? Authorizing Provider  benzonatate  (TESSALON ) 200 MG capsule  Take 1 capsule (200 mg total) by mouth 3 (three) times daily as needed for Cough 07/26/24  Yes [provider]  acetaminophen  (TYLENOL ) 500 MG tablet Take 500 mg by mouth every 6 (six) hours as needed for moderate pain or headache.    [provider]  alendronate  (FOSAMAX ) 70 MG tablet TAKE 1 TABLET(70 MG) BY MOUTH 1 TIME A WEEK WITH A FULL GLASS OF WATER AND ON AN EMPTY STOMACH 04/01/23   Finnegan, Timothy J, MD  Cholecalciferol (VITAMIN D PO) Take 1 tablet by mouth once a week.    [provider]  [facility] Liver Oil 10 MINIM CAPS Take by mouth.    [provider]  hydrochlorothiazide  (HYDRODIURIL ) 25 MG tablet Take 25 mg by mouth daily.    [provider]  letrozole  (FEMARA ) 2.5 MG tablet Take 1 tablet (2.5 mg total) by mouth daily. 05/13/22   Jacobo Evalene PARAS, MD  levothyroxine  (SYNTHROID ) 125 MCG tablet Take 125 mcg by mouth daily. 08/19/21   [provider]  meloxicam  (MOBIC ) 7.5 MG tablet Take 7.5 mg by mouth daily as needed for pain.    [provider]  neomycin -polymyxin-hydrocortisone (CORTISPORIN) 3.5-10000-1 OTIC suspension Apply 1-2 drops daily after soaking and cover with bandaid Patient not taking: Reported on 01/25/2024 10/23/21   Silva Juliene SAUNDERS, DPM  phenylephrine  (SUDAFED PE) 10 MG TABS tablet Take 10 mg by mouth every 6 (six) hours as needed (congestion).    [provider]  risperiDONE  (RISPERDAL ) 1 MG tablet Take 1 mg by mouth at bedtime.    [provider]  rivaroxaban  (XARELTO ) 20 MG TABS tablet Take 20 mg by mouth daily.    [provider]  simvastatin  (ZOCOR ) 20 MG tablet Take 20 mg by mouth every evening.  12/04/15   [provider]    Family History Family History  Problem Relation Age of Onset   Breast cancer Neg Hx    Prostate cancer Neg Hx    Kidney cancer Neg Hx     Social History Social History   Tobacco Use   Smoking status: Never   Smokeless tobacco: Never  Vaping  Use   Vaping status: Never Used  Substance Use Topics   Alcohol use: No   Drug use: No     Allergies   Levaquin [levofloxacin in d5w], Macrolides and ketolides, Penicillins, and Sulfa antibiotics   Review of Systems Review of Systems  Constitutional:  Negative for chills, diaphoresis, fatigue and fever.  HENT:  Negative for congestion, ear pain, rhinorrhea, sinus pressure, sinus pain and sore throat.   Respiratory:  Positive for cough. Negative for shortness of breath.   Cardiovascular:  Negative for chest pain.  Gastrointestinal:  Negative for abdominal pain, nausea and vomiting.  Musculoskeletal:  Negative for arthralgias and myalgias.  Skin:  Negative for rash.  Neurological:  Negative for weakness and headaches.  Hematological:  Negative for adenopathy.     Physical Exam Triage Vital Signs ED Triage Vitals  Encounter Vitals Group     BP  Girls Systolic BP Percentile      Girls Diastolic BP Percentile      Boys Systolic BP Percentile      Boys Diastolic BP Percentile      Pulse      Resp      Temp      Temp src      SpO2      Weight      Height      Head Circumference      Peak Flow      Pain Score      Pain Loc      Pain Education      Exclude from Growth Chart    No data found.  Updated Vital Signs BP (!) 122/90 (BP Location: Right Arm)   Pulse 90   Temp 99.5 F (37.5 C) (Oral)   Resp 18   Ht 5' 4 (1.626 m)   Wt 200 lb (90.7 kg)   SpO2 94%   BMI 34.33 kg/m     Physical Exam Vitals and nursing note reviewed.  Constitutional:      General: She is not in acute distress.    Appearance: Normal appearance. She is not ill-appearing or toxic-appearing.  HENT:     Head: Normocephalic and atraumatic.     Nose: Nose normal.     Mouth/Throat:     Mouth: Mucous membranes are moist.     Pharynx: Oropharynx is clear.  Eyes:     General: No scleral icterus.       Right eye: No discharge.        Left eye: No discharge.     Conjunctiva/sclera:  Conjunctivae normal.  Cardiovascular:     Rate and Rhythm: Normal rate. Rhythm irregular.     Heart sounds: Normal heart sounds.  Pulmonary:     Effort: Pulmonary effort is normal. No respiratory distress.     Breath sounds: Normal breath sounds.  Musculoskeletal:     Cervical back: Neck supple.     Right lower leg: No edema.     Left lower leg: No edema.  Skin:    General: Skin is dry.  Neurological:     General: No focal deficit present.     Mental Status: She is alert. Mental status is at baseline.     Motor: No weakness.     Gait: Gait normal.  Psychiatric:        Mood and Affect: Mood normal.        Behavior: Behavior normal.      UC Treatments / Results  Labs (all labs ordered are listed, but only abnormal results are displayed) Labs Reviewed - No data to display  EKG   Radiology DG Chest 2 View Result Date: 08/02/2024 EXAM: 2 VIEW(S) XRAY OF THE CHEST 08/02/2024 01:33:34 PM COMPARISON: None available. CLINICAL HISTORY: Cough \\T \ wheezing x1 mon. Pt c/o cough \\T \ wheezing x4 wks. Denies any hx of asthma or COPD. Was given tessalon  by PCP on 9/4. FINDINGS: LUNGS AND PLEURA: Pulmonary interstitial prominence compatible with mild pulmonary edema. Trace bilateral pleural effusions. No pneumothorax. HEART AND MEDIASTINUM: No acute abnormality of the cardiac and mediastinal silhouettes. BONES AND SOFT TISSUES: No acute osseous abnormality. IMPRESSION: 1. Pulmonary interstitial prominence with mild pulmonary edema. 2. Trace bilateral pleural effusions. Electronically signed by: Waddell Calk MD 08/02/2024 02:03 PM EDT RP Workstation: [id number]    Procedures ED EKG  Date/Time: 08/02/2024 2:56 PM  Performed by: Arvis Jolan NOVAK, PA-C Authorized  by: Arvis Jolan NOVAK, PA-C   Previous ECG:    Previous ECG:  Compared to current   Similarity:  Changes noted Interpretation:    Interpretation: abnormal   Rate:    ECG rate:  157   ECG rate assessment: tachycardic   Rhythm:     Rhythm: other rhythm   Ectopy:    Ectopy: PVCs   QRS:    QRS axis:  Normal   QRS intervals:  Normal   QRS conduction: normal   ST segments:    ST segments:  Non-specific T waves:    T waves: non-specific   Comments:     Undetermined irregular rhythm with tachycardia ST and T wave abnormality, nonspecific.  (including critical care time)  Medications Ordered in UC Medications - No data to display  Initial Impression / Assessment and Plan / UC Course  I have reviewed the triage vital signs and the nursing notes.  Pertinent labs & imaging results that were available during my care of the patient were reviewed by me and considered in my medical decision making (see chart for details).   73 y/o female with history of hypothyroidism, hypertension, hyperlipidemia, chronic DVT on long-term anticoagulants with IVC filter and breast cancer in 2019 presents with husband for dry cough x 1 month. No fever, nasal congestion, chest pain, shortness of breath or weakness. No recent worsening of symptoms.   Chest clear. Irregular rhythm. No extremity swelling.   Chest x-ray obtained. X-ray shows pulmonary edema and mild pleural effusions.   On exam, patient noted to have irregular rhythm so EKG was ordered. EKG shows irregular rhythm, likely atrial fibrillation with ventricular rate 157-166 bpm with multiple PVCs together and non specific ST-T wave abnormality.   Patient is denying chest pain, palpitations, dizziness, presyncope/syncope, shortness of breath, fatigue or weakness.  I reviewed the results of the x-ray and the EKG with patient and her husband and discussed that I have concerns that she needs to be evaluated and monitored in the emergency department.  She has no history of atrial fibrillation.  Explained that she likely needs to be on a cardiac monitor and may need to have imaging of her heart/chest performed. Patient is reluctantly agreeable. Declines EMS. Husband will take to [facility].  Leaving in stable condition.   Final Clinical Impressions(s) / UC Diagnoses   Final diagnoses:  Cardiac arrhythmia, unspecified cardiac arrhythmia type  Acute pulmonary edema (HCC)  Subacute cough     Discharge Instructions      - As discussed you will have a concerning irregular heart rhythm.  The ventricular rate is very fast at 157 beats a minute.  You also have multiple PVCs in a row which puts you at risk for ventricular tachycardia.  This can lead to a fatal heart rhythm. - Your chest x-ray shows fluid in your lungs.  This could be related to a heart issue and deafly needs to be assessed further in the ER. - Advised going to the emergency department immediately.  You have declined EMS.  Do not delay going to the emergency department.  If you do and you are having a dangerous heart rhythm this could be fatal.  You have been advised to follow up immediately in the emergency department for concerning signs.symptoms. If you declined EMS transport, please have a family member take you directly to the ED at this time. Do not delay. Based on concerns about condition, if you do not follow up in th e ED, you  may risk poor outcomes including worsening of condition, delayed treatment and potentially life threatening issues. If you have declined to go to the ED at this time, you should call your PCP immediately to set up a follow up appointment.  Go to ED for red flag symptoms, including; fevers you cannot reduce with Tylenol /Motrin , severe headaches, vision changes, numbness/weakness in part of the body, lethargy, confusion, intractable vomiting, severe dehydration, chest pain, breathing difficulty, severe persistent abdominal or pelvic pain, signs of severe infection (increased redness, swelling of an area), feeling faint or passing out, dizziness, etc. You should especially go to the ED for sudden acute worsening of condition if you do not elect to go at this time.      ED Prescriptions    None    PDMP not reviewed this encounter.   Arvis Jolan NOVAK, PA-C 08/02/24 2006

## 2024-08-02 NOTE — ED Triage Notes (Signed)
 Pt c/o cough & wheezing x4 wks. Denies any hx of asthma or COPD. Was given tessalon  by PCP on 9/4.

## 2024-08-02 NOTE — Discharge Instructions (Signed)
-   As discussed you will have a concerning irregular heart rhythm.  The ventricular rate is very fast at 157 beats a minute.  You also have multiple PVCs in a row which puts you at risk for ventricular tachycardia.  This can lead to a fatal heart rhythm. - Your chest x-ray shows fluid in your lungs.  This could be related to a heart issue and deafly needs to be assessed further in the ER. - Advised going to the emergency department immediately.  You have declined EMS.  Do not delay going to the emergency department.  If you do and you are having a dangerous heart rhythm this could be fatal.  You have been advised to follow up immediately in the emergency department for concerning signs.symptoms. If you declined EMS transport, please have a family member take you directly to the ED at this time. Do not delay. Based on concerns about condition, if you do not follow up in th e ED, you may risk poor outcomes including worsening of condition, delayed treatment and potentially life threatening issues. If you have declined to go to the ED at this time, you should call your PCP immediately to set up a follow up appointment.  Go to ED for red flag symptoms, including; fevers you cannot reduce with Tylenol /Motrin , severe headaches, vision changes, numbness/weakness in part of the body, lethargy, confusion, intractable vomiting, severe dehydration, chest pain, breathing difficulty, severe persistent abdominal or pelvic pain, signs of severe infection (increased redness, swelling of an area), feeling faint or passing out, dizziness, etc. You should especially go to the ED for sudden acute worsening of condition if you do not elect to go at this time.

## 2024-08-02 NOTE — ED Triage Notes (Signed)
 Pt arrives from [facility] urgent care with c/o a high heart rate and an abnormal EKG. Pt denies chest pain, sob. Pt does not have any prior cardiac history. Pt is A&Ox4 and ambulatory during triage.

## 2024-08-22 DIAGNOSIS — E66812 Obesity, class 2: Secondary | ICD-10-CM | POA: Diagnosis not present

## 2024-08-22 DIAGNOSIS — E7800 Pure hypercholesterolemia, unspecified: Secondary | ICD-10-CM | POA: Diagnosis not present

## 2024-08-22 DIAGNOSIS — I890 Lymphedema, not elsewhere classified: Secondary | ICD-10-CM | POA: Diagnosis not present

## 2024-08-22 DIAGNOSIS — I10 Essential (primary) hypertension: Secondary | ICD-10-CM | POA: Diagnosis not present

## 2024-08-22 DIAGNOSIS — R7303 Prediabetes: Secondary | ICD-10-CM | POA: Diagnosis not present

## 2024-08-22 DIAGNOSIS — I4891 Unspecified atrial fibrillation: Secondary | ICD-10-CM | POA: Diagnosis not present

## 2024-08-22 DIAGNOSIS — R053 Chronic cough: Secondary | ICD-10-CM | POA: Diagnosis not present

## 2024-08-22 DIAGNOSIS — K219 Gastro-esophageal reflux disease without esophagitis: Secondary | ICD-10-CM | POA: Diagnosis not present

## 2024-08-24 ENCOUNTER — Emergency Department

## 2024-08-24 ENCOUNTER — Other Ambulatory Visit: Payer: Self-pay

## 2024-08-24 ENCOUNTER — Encounter: Payer: Self-pay | Admitting: Emergency Medicine

## 2024-08-24 ENCOUNTER — Inpatient Hospital Stay
Admission: EM | Admit: 2024-08-24 | Discharge: 2024-08-31 | DRG: 286 | Disposition: A | Discharge status: Home / Self Care | Attending: Internal Medicine | Admitting: Internal Medicine

## 2024-08-24 DIAGNOSIS — I4891 Unspecified atrial fibrillation: Principal | ICD-10-CM | POA: Insufficient documentation

## 2024-08-24 DIAGNOSIS — Z79890 Hormone replacement therapy (postmenopausal): Secondary | ICD-10-CM

## 2024-08-24 DIAGNOSIS — Z7901 Long term (current) use of anticoagulants: Secondary | ICD-10-CM | POA: Diagnosis not present

## 2024-08-24 DIAGNOSIS — Z7983 Long term (current) use of bisphosphonates: Secondary | ICD-10-CM | POA: Diagnosis not present

## 2024-08-24 DIAGNOSIS — Z923 Personal history of irradiation: Secondary | ICD-10-CM | POA: Diagnosis not present

## 2024-08-24 DIAGNOSIS — E870 Hyperosmolality and hypernatremia: Secondary | ICD-10-CM | POA: Diagnosis not present

## 2024-08-24 DIAGNOSIS — E876 Hypokalemia: Secondary | ICD-10-CM | POA: Diagnosis present

## 2024-08-24 DIAGNOSIS — E669 Obesity, unspecified: Secondary | ICD-10-CM | POA: Diagnosis present

## 2024-08-24 DIAGNOSIS — Z79899 Other long term (current) drug therapy: Secondary | ICD-10-CM

## 2024-08-24 DIAGNOSIS — R7303 Prediabetes: Secondary | ICD-10-CM | POA: Diagnosis not present

## 2024-08-24 DIAGNOSIS — I480 Paroxysmal atrial fibrillation: Secondary | ICD-10-CM | POA: Insufficient documentation

## 2024-08-24 DIAGNOSIS — K219 Gastro-esophageal reflux disease without esophagitis: Secondary | ICD-10-CM | POA: Diagnosis not present

## 2024-08-24 DIAGNOSIS — I429 Cardiomyopathy, unspecified: Secondary | ICD-10-CM | POA: Diagnosis not present

## 2024-08-24 DIAGNOSIS — E7800 Pure hypercholesterolemia, unspecified: Secondary | ICD-10-CM | POA: Diagnosis present

## 2024-08-24 DIAGNOSIS — R918 Other nonspecific abnormal finding of lung field: Secondary | ICD-10-CM | POA: Diagnosis not present

## 2024-08-24 DIAGNOSIS — I4819 Other persistent atrial fibrillation: Principal | ICD-10-CM | POA: Diagnosis present

## 2024-08-24 DIAGNOSIS — Z880 Allergy status to penicillin: Secondary | ICD-10-CM

## 2024-08-24 DIAGNOSIS — Z9011 Acquired absence of right breast and nipple: Secondary | ICD-10-CM

## 2024-08-24 DIAGNOSIS — I5043 Acute on chronic combined systolic (congestive) and diastolic (congestive) heart failure: Secondary | ICD-10-CM | POA: Diagnosis present

## 2024-08-24 DIAGNOSIS — Z95828 Presence of other vascular implants and grafts: Secondary | ICD-10-CM | POA: Diagnosis not present

## 2024-08-24 DIAGNOSIS — I428 Other cardiomyopathies: Secondary | ICD-10-CM | POA: Diagnosis present

## 2024-08-24 DIAGNOSIS — I249 Acute ischemic heart disease, unspecified: Secondary | ICD-10-CM | POA: Diagnosis not present

## 2024-08-24 DIAGNOSIS — Z79811 Long term (current) use of aromatase inhibitors: Secondary | ICD-10-CM

## 2024-08-24 DIAGNOSIS — Z882 Allergy status to sulfonamides status: Secondary | ICD-10-CM | POA: Diagnosis not present

## 2024-08-24 DIAGNOSIS — E039 Hypothyroidism, unspecified: Secondary | ICD-10-CM | POA: Diagnosis present

## 2024-08-24 DIAGNOSIS — N182 Chronic kidney disease, stage 2 (mild): Secondary | ICD-10-CM | POA: Diagnosis present

## 2024-08-24 DIAGNOSIS — I081 Rheumatic disorders of both mitral and tricuspid valves: Secondary | ICD-10-CM | POA: Diagnosis present

## 2024-08-24 DIAGNOSIS — Z7984 Long term (current) use of oral hypoglycemic drugs: Secondary | ICD-10-CM

## 2024-08-24 DIAGNOSIS — Z881 Allergy status to other antibiotic agents status: Secondary | ICD-10-CM

## 2024-08-24 DIAGNOSIS — I5033 Acute on chronic diastolic (congestive) heart failure: Secondary | ICD-10-CM | POA: Insufficient documentation

## 2024-08-24 DIAGNOSIS — Z86718 Personal history of other venous thrombosis and embolism: Secondary | ICD-10-CM

## 2024-08-24 DIAGNOSIS — Z853 Personal history of malignant neoplasm of breast: Secondary | ICD-10-CM

## 2024-08-24 DIAGNOSIS — I130 Hypertensive heart and chronic kidney disease with heart failure and stage 1 through stage 4 chronic kidney disease, or unspecified chronic kidney disease: Secondary | ICD-10-CM | POA: Diagnosis not present

## 2024-08-24 DIAGNOSIS — I110 Hypertensive heart disease with heart failure: Secondary | ICD-10-CM | POA: Diagnosis not present

## 2024-08-24 DIAGNOSIS — R0602 Shortness of breath: Secondary | ICD-10-CM | POA: Diagnosis not present

## 2024-08-24 DIAGNOSIS — I509 Heart failure, unspecified: Secondary | ICD-10-CM | POA: Diagnosis not present

## 2024-08-24 DIAGNOSIS — J90 Pleural effusion, not elsewhere classified: Secondary | ICD-10-CM | POA: Diagnosis not present

## 2024-08-24 DIAGNOSIS — I517 Cardiomegaly: Secondary | ICD-10-CM | POA: Diagnosis not present

## 2024-08-24 DIAGNOSIS — R0989 Other specified symptoms and signs involving the circulatory and respiratory systems: Secondary | ICD-10-CM | POA: Diagnosis not present

## 2024-08-24 DIAGNOSIS — I10 Essential (primary) hypertension: Secondary | ICD-10-CM | POA: Diagnosis not present

## 2024-08-24 LAB — TSH: TSH: 2.947 u[IU]/mL (ref 0.350–4.500)

## 2024-08-24 LAB — CBC
HCT: 39.9 % (ref 36.0–46.0)
Hemoglobin: 12.9 g/dL (ref 12.0–15.0)
MCH: 30.6 pg (ref 26.0–34.0)
MCHC: 32.3 g/dL (ref 30.0–36.0)
MCV: 94.5 fL (ref 80.0–100.0)
Platelets: 161 K/uL (ref 150–400)
RBC: 4.22 MIL/uL (ref 3.87–5.11)
RDW: 14.6 % (ref 11.5–15.5)
WBC: 8.7 K/uL (ref 4.0–10.5)
nRBC: 0 % (ref 0.0–0.2)

## 2024-08-24 LAB — BASIC METABOLIC PANEL WITH GFR
Anion gap: 13 (ref 5–15)
BUN: 18 mg/dL (ref 8–23)
CO2: 24 mmol/L (ref 22–32)
Calcium: 9.3 mg/dL (ref 8.9–10.3)
Chloride: 102 mmol/L (ref 98–111)
Creatinine, Ser: 0.88 mg/dL (ref 0.44–1.00)
GFR, Estimated: >60 mL/min (ref >60)
Glucose, Bld: 140 mg/dL — ABNORMAL HIGH (ref 70–99)
Potassium: 3.5 mmol/L (ref 3.5–5.1)
Sodium: 139 mmol/L (ref 135–145)

## 2024-08-24 LAB — BRAIN NATRIURETIC PEPTIDE: B Natriuretic Peptide: 200 pg/mL — ABNORMAL HIGH (ref 0.0–100.0)

## 2024-08-24 LAB — MAGNESIUM: Magnesium: 2 mg/dL (ref 1.7–2.4)

## 2024-08-24 LAB — TROPONIN I (HIGH SENSITIVITY)
Troponin I (High Sensitivity): 5 ng/L (ref <18)
Troponin I (High Sensitivity): 5 ng/L (ref <18)

## 2024-08-24 MED ORDER — ACETAMINOPHEN 325 MG PO TABS
650.0000 mg | ORAL_TABLET | Freq: Four times a day (QID) | ORAL | Status: DC | PRN
  Administered 2024-08-24: 650 mg via ORAL

## 2024-08-24 MED ORDER — LEVOTHYROXINE SODIUM 125 MCG PO TABS
125.0000 ug | ORAL_TABLET | Freq: Every day | ORAL | Status: DC
  Administered 2024-08-25 – 2024-08-31 (×7): 125 ug via ORAL
  Filled 2024-08-24 (×7): qty 1

## 2024-08-24 MED ORDER — ACETAMINOPHEN 650 MG RE SUPP: 650.0000 mg | Freq: Four times a day (QID) | RECTAL | Status: DC | PRN

## 2024-08-24 MED ORDER — AMIODARONE LOAD VIA INFUSION
150.0000 mg | Freq: Once | INTRAVENOUS | Status: AC
  Administered 2024-08-24: 150 mg via INTRAVENOUS
  Filled 2024-08-24: qty 83.34

## 2024-08-24 MED ORDER — RISPERIDONE 1 MG PO TABS
1.0000 mg | ORAL_TABLET | Freq: Every day | ORAL | Status: DC
  Administered 2024-08-24 – 2024-08-30 (×7): 1 mg via ORAL
  Filled 2024-08-24 (×7): qty 1

## 2024-08-24 MED ORDER — RIVAROXABAN 20 MG PO TABS
20.0000 mg | ORAL_TABLET | Freq: Every day | ORAL | Status: DC
Start: 2024-08-25 — End: 2024-08-25
  Administered 2024-08-25: 20 mg via ORAL
  Filled 2024-08-24: qty 1

## 2024-08-24 MED ORDER — AMIODARONE HCL IN DEXTROSE 360-4.14 MG/200ML-% IV SOLN
60.0000 mg/h | INTRAVENOUS | Status: DC
  Administered 2024-08-24 (×2): 60 mg/h via INTRAVENOUS
  Filled 2024-08-24 (×2): qty 200

## 2024-08-24 MED ORDER — POLYETHYLENE GLYCOL 3350 17 G PO PACK
17.0000 g | PACK | Freq: Every day | ORAL | Status: DC | PRN
  Administered 2024-08-25 – 2024-08-31 (×4): 17 g via ORAL
  Filled 2024-08-24 (×4): qty 1

## 2024-08-24 MED ORDER — ONDANSETRON HCL 4 MG/2ML IJ SOLN: 4.0000 mg | Freq: Four times a day (QID) | INTRAMUSCULAR | Status: DC | PRN

## 2024-08-24 MED ORDER — DOCUSATE SODIUM 100 MG PO CAPS
100.0000 mg | ORAL_CAPSULE | Freq: Two times a day (BID) | ORAL | Status: DC
  Administered 2024-08-24 – 2024-08-31 (×13): 100 mg via ORAL
  Filled 2024-08-24 (×14): qty 1

## 2024-08-24 MED ORDER — ONDANSETRON HCL 4 MG PO TABS: 4.0000 mg | ORAL_TABLET | Freq: Four times a day (QID) | ORAL | Status: DC | PRN

## 2024-08-24 MED ORDER — LETROZOLE 2.5 MG PO TABS: 2.5000 mg | ORAL_TABLET | Freq: Every day | ORAL | Status: DC

## 2024-08-24 MED ORDER — DILTIAZEM HCL-DEXTROSE 125-5 MG/125ML-% IV SOLN (PREMIX)
5.0000 mg/h | INTRAVENOUS | Status: DC
  Administered 2024-08-24: 5 mg/h via INTRAVENOUS
  Filled 2024-08-24: qty 125

## 2024-08-24 MED ORDER — DIGOXIN 0.25 MG/ML IJ SOLN
0.1250 mg | Freq: Once | INTRAMUSCULAR | Status: AC
  Administered 2024-08-24: 0.125 mg via INTRAVENOUS
  Filled 2024-08-24: qty 2

## 2024-08-24 MED ORDER — AMIODARONE HCL IN DEXTROSE 360-4.14 MG/200ML-% IV SOLN
30.0000 mg/h | INTRAVENOUS | Status: DC
  Administered 2024-08-24 – 2024-08-25 (×2): 30 mg/h via INTRAVENOUS
  Filled 2024-08-24: qty 200

## 2024-08-24 MED ORDER — BENZONATATE 100 MG PO CAPS: 100.0000 mg | ORAL_CAPSULE | Freq: Three times a day (TID) | ORAL | Status: DC | PRN

## 2024-08-24 MED ORDER — FUROSEMIDE 10 MG/ML IJ SOLN
40.0000 mg | Freq: Every day | INTRAMUSCULAR | Status: DC
  Administered 2024-08-25 – 2024-08-27 (×3): 40 mg via INTRAVENOUS
  Filled 2024-08-24 (×3): qty 4

## 2024-08-24 MED ORDER — METOPROLOL TARTRATE 5 MG/5ML IV SOLN
5.0000 mg | INTRAVENOUS | Status: DC | PRN
  Administered 2024-08-24 – 2024-08-25 (×2): 5 mg via INTRAVENOUS
  Filled 2024-08-24 (×3): qty 5

## 2024-08-24 MED ORDER — DILTIAZEM HCL 25 MG/5ML IV SOLN
10.0000 mg | Freq: Once | INTRAVENOUS | Status: AC
  Administered 2024-08-24: 10 mg via INTRAVENOUS
  Filled 2024-08-24: qty 5

## 2024-08-24 MED ORDER — POTASSIUM CHLORIDE CRYS ER 20 MEQ PO TBCR
40.0000 meq | EXTENDED_RELEASE_TABLET | Freq: Once | ORAL | Status: AC
  Administered 2024-08-24: 40 meq via ORAL
  Filled 2024-08-24: qty 2

## 2024-08-24 MED ORDER — SIMVASTATIN 20 MG PO TABS
20.0000 mg | ORAL_TABLET | Freq: Every evening | ORAL | Status: DC
  Administered 2024-08-24 – 2024-08-30 (×7): 20 mg via ORAL
  Filled 2024-08-24 (×7): qty 1

## 2024-08-24 MED ORDER — FUROSEMIDE 10 MG/ML IJ SOLN
60.0000 mg | Freq: Once | INTRAMUSCULAR | Status: AC
Start: 2024-08-24 — End: 2024-08-24
  Administered 2024-08-24: 60 mg via INTRAVENOUS
  Filled 2024-08-24: qty 8

## 2024-08-24 MED ORDER — DILTIAZEM HCL 25 MG/5ML IV SOLN
15.0000 mg | Freq: Once | INTRAVENOUS | Status: AC
  Administered 2024-08-24: 15 mg via INTRAVENOUS
  Filled 2024-08-24: qty 5

## 2024-08-24 NOTE — ED Triage Notes (Addendum)
 Pt arrives ambulatory to triage, gait steady, w/ no acute distress noted c/o of increased sob over the past few days. Recently diagnosed w/ afib in September, takes xarelto . Denies chest pain but reports intermittent palpitations, denies sensation at this time,

## 2024-08-24 NOTE — ED Provider Notes (Signed)
 [facility] Provider Note    Event Date/Time   First MD Initiated Contact with Patient 08/24/24 [phone]     (approximate)   History   Shortness of Breath   HPI  Susan Myers is a 72 y.o. female who presents to the ED for evaluation of Shortness of Breath   I review a cardiology clinic visit from 2 days ago.  Prescribed diltiazem , Xarelto  for paroxysmal A-fib and history of multiple DVTs.  Recently started amiodarone 2 days ago.    Patient presents with her husband due to a month of progressively worsening shortness of breath, orthopnea, dyspnea on exertion and nonproductive cough.  Intermittent palpitations.  Not been taking metoprolol Dr. Florencio changed his mind.  She is currently taking diltiazem  CD 120 mg twice daily and amiodarone  Physical Exam   Triage Vital Signs: ED Triage Vitals  Encounter Vitals Group     BP 08/24/24 0656 (!) 158/135     Girls Systolic BP Percentile --      Girls Diastolic BP Percentile --      Boys Systolic BP Percentile --      Boys Diastolic BP Percentile --      Pulse Rate 08/24/24 0656 (!) 151     Resp 08/24/24 0656 18     Temp 08/24/24 0656 98.2 F (36.8 C)     Temp Source 08/24/24 0656 Oral     SpO2 08/24/24 0656 92 %     Weight --      Height --      Head Circumference --      Peak Flow --      Pain Score 08/24/24 0653 0     Pain Loc --      Pain Education --      Exclude from Growth Chart --     Most recent vital signs: Vitals:   08/24/24 0710 08/24/24 0730  BP: (!) 142/103 128/79  Pulse: 89 (!) 148  Resp: (!) 26 (!) 23  Temp:    SpO2: 92% 94%    General: Awake, no distress.  CV:  Good peripheral perfusion.  Tachycardic and irregular Resp:  Mild tachypnea without distress, clear lungs, no wheezing Abd:  No distention.  MSK:  No deformity noted.  Peripheral pitting edema Neuro:  No focal deficits appreciated. Other:     ED Results / Procedures / Treatments   Labs (all labs  ordered are listed, but only abnormal results are displayed) Labs Reviewed  BASIC METABOLIC PANEL WITH GFR - Abnormal; Notable for the following components:      Result Value   Glucose, Bld 140 (*)    All other components within normal limits  BRAIN NATRIURETIC PEPTIDE - Abnormal; Notable for the following components:   B Natriuretic Peptide 200.0 (*)    All other components within normal limits  CBC  MAGNESIUM   TROPONIN I (HIGH SENSITIVITY)  TROPONIN I (HIGH SENSITIVITY)    EKG A-fib with RVR with a rate of 158 bpm, no STEMI, 1 PVC.  First EKG with more irregular rhythm.  Seems to be rapid A-fib with significant ectopy with up to 4 beats of NSVT occurring multiple times in the rhythm strip.  RADIOLOGY 2 view CXR interpreted by me with pulmonary vascular congestion  Official radiology report(s): DG Chest 2 View Result Date: 08/24/2024 CLINICAL DATA:  73 year old female with shortness of breath. EXAM: CHEST - 2 VIEW COMPARISON:  CTA chest 08/02/2024 and earlier. FINDINGS: PA and lateral  views 0714 hours. Small pleural effusions on CTA earlier this month persist. Slightly lower lung volume since that time. Stable cardiac size and mediastinal contours. Nonspecific symmetric pulmonary interstitial opacity has not significantly changed over this series of exams. No pneumothorax. No consolidation or confluent opacity. Osteopenia. No acute osseous abnormality identified. IVC filter partially visible, obliquely oriented. Negative visible bowel gas. IMPRESSION: 1. Unresolved small pleural effusions and lower lung volumes from radiographs and CTA earlier this month. Nonspecific symmetric increased interstitium not significantly changed. Considerations include chronic interstitial disease, interstitial edema, and less likely viral/atypical respiratory infection. 2. No new cardiopulmonary abnormality.  IVC filter visible. Electronically Signed   By: VEAR Hurst M.D.   On: 08/24/2024 07:47    PROCEDURES  and INTERVENTIONS:  .Critical Care  Performed by: Claudene Rover, MD Authorized by: Claudene Rover, MD   Critical care provider statement:    Critical care time (minutes):  30   Critical care time was exclusive of:  Separately billable procedures and treating other patients   Critical care was necessary to treat or prevent imminent or life-threatening deterioration of the following conditions:  Cardiac failure and circulatory failure   Critical care was time spent personally by me on the following activities:  Development of treatment plan with patient or surrogate, discussions with consultants, evaluation of patient's response to treatment, examination of patient, ordering and review of laboratory studies, ordering and review of radiographic studies, ordering and performing treatments and interventions, pulse oximetry, re-evaluation of patient's condition and review of old charts .1-3 Lead EKG Interpretation  Performed by: Claudene Rover, MD Authorized by: Claudene Rover, MD     Interpretation: abnormal     ECG rate:  147   ECG rate assessment: tachycardic     Rhythm: atrial fibrillation     Ectopy: none     Conduction: normal     Medications  diltiazem  (CARDIZEM ) 125 mg in dextrose  5% 125 mL (1 mg/mL) infusion (has no administration in time range)  diltiazem  (CARDIZEM ) injection 10 mg (has no administration in time range)  potassium chloride SA (KLOR-CON M) CR tablet 40 mEq (40 mEq Oral Given 08/24/24 0801)  furosemide (LASIX) injection 60 mg (60 mg Intravenous Given 08/24/24 0804)  diltiazem  (CARDIZEM ) injection 15 mg (15 mg Intravenous Given 08/24/24 0801)     IMPRESSION / MDM / ASSESSMENT AND PLAN / ED COURSE  I reviewed the triage vital signs and the nursing notes.  Differential diagnosis includes, but is not limited to, ACS, PTX, PNA, muscle strain/spasm, PE, dissection, anxiety, pleural effusion  {Patient presents with symptoms of an acute illness or injury that is potentially  life-threatening.  Patient with recent diagnosis of A-fib and multiple medication changes presents with signs of volume overload likely CHF exacerbation alongside symptomatic A-fib with RVR requiring medical admission.  Rapid A-fib but hemodynamically stable, tachypneic and volume overload.  Congested CXR and elevated BNP without comparison.  Normal CBC, metabolic panel, troponin and electrolytes.  Low normal potassium and start replacement orally as we initiate IV diuresis and IV rate control with diltiazem  bolus and infusion.  Consult medicine for admission.  Clinical Course as of 08/24/24 0839  Wed Aug 24, 2024  0826 Reassessed and discussed plan of care, plan for admission.  She is agreeable. [DS]  [id number] Consulted medicine who agrees to admit [DS]    Clinical Course User Index [DS] Claudene Rover, MD     FINAL CLINICAL IMPRESSION(S) / ED DIAGNOSES   Final diagnoses:  Atrial fibrillation with RVR (  HCC)  Congestive heart failure, unspecified HF chronicity, unspecified heart failure type (HCC)     Rx / DC Orders   ED Discharge Orders     None        Note:  This document was prepared using Dragon voice recognition software and may include unintentional dictation errors.   Claudene Rover, MD 08/24/24 [phone]

## 2024-08-24 NOTE — ED Notes (Signed)
 Pt returned from xray

## 2024-08-24 NOTE — ED Notes (Signed)
 Called CCMD to place pt on central monitoring

## 2024-08-24 NOTE — ED Notes (Signed)
 MD at bedside.

## 2024-08-24 NOTE — H&P (Signed)
 History and Physical    Susan Myers FMW:[id number] DOB: 09-09-1951 DOA: 08/24/2024  PCP: Auston Reyes BIRCH, MD (Confirm with patient/family/NH records and if not entered, this has to be entered at [facility] point of entry) Patient coming from: Home  I have personally briefly reviewed patient's old medical records in [facility]  Chief Complaint: SOB, cough, swelling  HPI: Susan Myers is a 73 y.o. female with medical history significant of PAF on Xarelto , HTN, breast cancer status post lumpectomy radiation, on chronic hormonal manipulation, hypothyroidism, presented with worsening of cough shortness of breath and peripheral edema.  Patient was diagnosed with PAF in September of this year, was started on Cardizem , Xarelto  however patient continued to experience breakthrough tachycardia heart rate ranging from 90-140-150s on the self-monitoring at home.  Last 2 days she has been having increasing exertional dyspnea and she also developed orthopnea and has been noticed increasing ankle edema.  She has a dry cough but denies any chest pain no fever or chills.  Eventually she went back to see cardiology 2 days ago, cardiology increased her Cardizem  from 120 mg to 240 mg daily, and started on loading dose of amiodarone for last 2 days.  Overnight patient could not lay flat at all due to shortness of breath and decided to come to ED this morning.  ED Course: Afebrile, heart rate 140-150s, EKG showed A-fib with RVR, blood pressure 150/100, chest x-ray showed pulmonary congestion, blood work showed K3.5, magnesium  2.0.  Patient was given Cardizem  loading dose and started on Cardizem  drip and 1 dose of Lasix 60 mg given in the ED.  Review of Systems: As per HPI otherwise 14 point review of systems negative.    Past Medical History:  Diagnosis Date   Actinic keratosis 01/10/2019   mid vertex scalp   Breast cancer (HCC) 2019   invasive mammary carcinoma, lumpectomy and rad tx    Complication of anesthesia    Delusional disorder (HCC)    DVT (deep venous thrombosis) (HCC) 06/2016   Dysplastic nevus 07/24/2020   R med calf - mod   GERD (gastroesophageal reflux disease)    History of kidney stones    Hx of blood clots    Hypercholesterolemia    Hypertension    Hypothyroidism    Kidney stones    Obesity    Personal history of radiation therapy 2019   right breast cancer   PONV (postoperative nausea and vomiting)    Prediabetes    Thyroid  disease     Past Surgical History:  Procedure Laterality Date   ABDOMINAL HYSTERECTOMY     APPENDECTOMY     BREAST BIOPSY Right 03/23/2018   Affirm Bx- invasive mammary carcinoma   BREAST CYST ASPIRATION Bilateral 1990   BREAST LUMPECTOMY Right 2019   invasive mammary carcinoma, clear surgical margins   COLONOSCOPY     IVC FILTER INSERTION     PARTIAL MASTECTOMY WITH NEEDLE LOCALIZATION Right 04/01/2018   Procedure: PARTIAL MASTECTOMY WITH NEEDLE LOCALIZATION;  Surgeon: Rodolph Romano, MD;  Location: [facility] ORS;  Service: General;  Laterality: Right;   PERIPHERAL VASCULAR CATHETERIZATION N/A 08/06/2016   Procedure: IVC Filter Insertion;  Surgeon: Cordella KANDICE Shawl, MD;  Location: [facility] INVASIVE CV LAB;  Service: Cardiovascular;  Laterality: N/A;   PERIPHERAL VASCULAR CATHETERIZATION Left 08/06/2016   Procedure: Lower Extremity Venography;  Surgeon: Cordella KANDICE Shawl, MD;  Location: [facility] INVASIVE CV LAB;  Service: Cardiovascular;  Laterality: Left;   PERIPHERAL VASCULAR CATHETERIZATION  08/06/2016  Procedure: Lower Extremity Intervention;  Surgeon: Cordella KANDICE Shawl, MD;  Location: [facility] INVASIVE CV LAB;  Service: Cardiovascular;;   PERIPHERAL VASCULAR CATHETERIZATION N/A 12/16/2016   Procedure: IVC Filter Removal;  Surgeon: Cordella KANDICE Shawl, MD;  Location: [facility] INVASIVE CV LAB;  Service: Cardiovascular;  Laterality: N/A;   SENTINEL NODE BIOPSY Right 04/01/2018   Procedure: SENTINEL NODE BIOPSY;  Surgeon: Rodolph Romano, MD;  Location: [facility] ORS;  Service: General;  Laterality: Right;   TONSILLECTOMY       reports that she has never smoked. She has never used smokeless tobacco. She reports that she does not drink alcohol and does not use drugs.  Allergies  Allergen Reactions   Levaquin [Levofloxacin In D5w] Hives   Macrolides And Ketolides Other (See Comments)    WHELPS WHELPS WHELPS   Penicillins Itching    Can take amoxicillin   Has patient had a PCN reaction causing immediate rash, facial/tongue/throat swelling, SOB or lightheadedness with hypotension:No Has patient had a PCN reaction causing severe rash involving mucus membranes or skin necrosis:unsure Has patient had a PCN reaction that required hospitalization:No Has patient had a PCN reaction occurring within the last 10 years: No If all of the above answers are NO, then may proceed with Cephalosporin use.   Sulfa Antibiotics Hives    Family History  Problem Relation Age of Onset   Breast cancer Neg Hx    Prostate cancer Neg Hx    Kidney cancer Neg Hx      Prior to Admission medications   Medication Sig Start Date End Date Taking? Authorizing Provider  acetaminophen  (TYLENOL ) 500 MG tablet Take 500 mg by mouth every 6 (six) hours as needed for moderate pain or headache.    [provider]  alendronate  (FOSAMAX ) 70 MG tablet TAKE 1 TABLET(70 MG) BY MOUTH 1 TIME A WEEK WITH A FULL GLASS OF WATER AND ON AN EMPTY STOMACH 04/01/23   Jacobo Evalene PARAS, MD  benzonatate  (TESSALON ) 200 MG capsule Take 1 capsule (200 mg total) by mouth 3 (three) times daily as needed for Cough 07/26/24   [provider]  Cholecalciferol (VITAMIN D PO) Take 1 tablet by mouth once a week.    [provider]  [facility] Liver Oil 10 MINIM CAPS Take by mouth.    [provider]  diltiazem  (CARDIZEM  CD) 120 MG 24 hr capsule Take 1 capsule (120 mg total) by mouth daily. 08/02/24 09/01/24  Viviann Pastor, MD  hydrochlorothiazide   (HYDRODIURIL ) 25 MG tablet Take 25 mg by mouth daily.    [provider]  letrozole  (FEMARA ) 2.5 MG tablet Take 1 tablet (2.5 mg total) by mouth daily. 05/13/22   Jacobo Evalene PARAS, MD  levothyroxine  (SYNTHROID ) 125 MCG tablet Take 125 mcg by mouth daily. 08/19/21   [provider]  meloxicam  (MOBIC ) 7.5 MG tablet Take 7.5 mg by mouth daily as needed for pain.    [provider]  neomycin -polymyxin-hydrocortisone (CORTISPORIN) 3.5-10000-1 OTIC suspension Apply 1-2 drops daily after soaking and cover with bandaid Patient not taking: Reported on 01/25/2024 10/23/21   Silva Juliene SAUNDERS, DPM  phenylephrine  (SUDAFED PE) 10 MG TABS tablet Take 10 mg by mouth every 6 (six) hours as needed (congestion).    [provider]  risperiDONE  (RISPERDAL ) 1 MG tablet Take 1 mg by mouth at bedtime.    [provider]  rivaroxaban  (XARELTO ) 20 MG TABS tablet Take 20 mg by mouth daily.    [provider]  simvastatin  (ZOCOR ) 20  MG tablet Take 20 mg by mouth every evening.  12/04/15   [provider]    Physical Exam: Vitals:   08/24/24 0730 08/24/24 0830 08/24/24 0900 08/24/24 0930  BP: 128/79 127/85 (!) 116/96 (!) 121/94  Pulse: (!) 148 (!) 139 78 (!) 159  Resp: (!) 23 (!) 30 (!) 34 (!) 29  Temp:      TempSrc:      SpO2: 94% 92% 93% 92%    Constitutional: NAD, calm, comfortable Vitals:   08/24/24 0730 08/24/24 0830 08/24/24 0900 08/24/24 0930  BP: 128/79 127/85 (!) 116/96 (!) 121/94  Pulse: (!) 148 (!) 139 78 (!) 159  Resp: (!) 23 (!) 30 (!) 34 (!) 29  Temp:      TempSrc:      SpO2: 94% 92% 93% 92%   Eyes: PERRL, lids and conjunctivae normal ENMT: Mucous membranes are moist. Posterior pharynx clear of any exudate or lesions.Normal dentition.  Neck: normal, supple, no masses, no thyromegaly Respiratory: clear to auscultation bilaterally, no wheezing, fine crackles on bilateral lower fields, increasing respiratory effort. No accessory muscle  use.  Cardiovascular: Irregular heart rate, no murmurs / rubs / gallops.  2+ extremity edema. 2+ pedal pulses. No carotid bruits.  Abdomen: no tenderness, no masses palpated. No hepatosplenomegaly. Bowel sounds positive.  Musculoskeletal: no clubbing / cyanosis. No joint deformity upper and lower extremities. Good ROM, no contractures. Normal muscle tone.  Skin: no rashes, lesions, ulcers. No induration Neurologic: CN 2-12 grossly intact. Sensation intact, DTR normal. Strength 5/5 in all 4.  Psychiatric: Normal judgment and insight. Alert and oriented x 3. Normal mood.   Labs on Admission: I have personally reviewed following labs and imaging studies  CBC: Recent Labs  Lab 08/24/24 0708  WBC 8.7  HGB 12.9  HCT 39.9  MCV 94.5  PLT 161   Basic Metabolic Panel: Recent Labs  Lab 08/24/24 0708  NA 139  K 3.5  CL 102  CO2 24  GLUCOSE 140*  BUN 18  CREATININE 0.88  CALCIUM 9.3  MG 2.0   GFR: CrCl cannot be calculated (Unknown ideal weight.). Liver Function Tests: No results for input(s): AST, ALT, ALKPHOS, BILITOT, PROT, ALBUMIN in the last 168 hours. No results for input(s): LIPASE, AMYLASE in the last 168 hours. No results for input(s): AMMONIA in the last 168 hours. Coagulation Profile: No results for input(s): INR, PROTIME in the last 168 hours. Cardiac Enzymes: No results for input(s): CKTOTAL, CKMB, CKMBINDEX, TROPONINI in the last 168 hours. BNP (last 3 results) No results for input(s): PROBNP in the last 8760 hours. HbA1C: No results for input(s): HGBA1C in the last 72 hours. CBG: No results for input(s): GLUCAP in the last 168 hours. Lipid Profile: No results for input(s): CHOL, HDL, LDLCALC, TRIG, CHOLHDL, LDLDIRECT in the last 72 hours. Thyroid  Function Tests: No results for input(s): TSH, T4TOTAL, FREET4, T3FREE, THYROIDAB in the last 72 hours. Anemia Panel: No results for input(s): VITAMINB12,  FOLATE, FERRITIN, TIBC, IRON, RETICCTPCT in the last 72 hours. Urine analysis:    Component Value Date/Time   COLORURINE STRAW (A) 11/26/2015 1546   APPEARANCEUR Cloudy (A) 06/10/2017 1156   LABSPEC 1.005 11/26/2015 1546   PHURINE 6.0 11/26/2015 1546   GLUCOSEU Negative 06/10/2017 1156   HGBUR NEGATIVE 11/26/2015 1546   BILIRUBINUR Negative 06/10/2017 1156   KETONESUR NEGATIVE 11/26/2015 1546   PROTEINUR 1+ (A) 06/10/2017 1156   PROTEINUR NEGATIVE 11/26/2015 1546   NITRITE Negative 06/10/2017 1156   NITRITE NEGATIVE  11/26/2015 1546   LEUKOCYTESUR Trace (A) 06/10/2017 1156    Radiological Exams on Admission: DG Chest 2 View Result Date: 08/24/2024 CLINICAL DATA:  73 year old female with shortness of breath. EXAM: CHEST - 2 VIEW COMPARISON:  CTA chest 08/02/2024 and earlier. FINDINGS: PA and lateral views 0714 hours. Small pleural effusions on CTA earlier this month persist. Slightly lower lung volume since that time. Stable cardiac size and mediastinal contours. Nonspecific symmetric pulmonary interstitial opacity has not significantly changed over this series of exams. No pneumothorax. No consolidation or confluent opacity. Osteopenia. No acute osseous abnormality identified. IVC filter partially visible, obliquely oriented. Negative visible bowel gas. IMPRESSION: 1. Unresolved small pleural effusions and lower lung volumes from radiographs and CTA earlier this month. Nonspecific symmetric increased interstitium not significantly changed. Considerations include chronic interstitial disease, interstitial edema, and less likely viral/atypical respiratory infection. 2. No new cardiopulmonary abnormality.  IVC filter visible. Electronically Signed   By: VEAR Hurst M.D.   On: 08/24/2024 07:47    EKG: Independently reviewed.  A-fib with RVR, no acute ST changes.  Assessment/Plan Principal Problem:   Afib (HCC) Active Problems:   A-fib (HCC)   Acute on chronic diastolic CHF (congestive  heart failure) (HCC)  (please populate well all problems here in Problem List. (For example, if patient is on BP meds at home and you resume or decide to hold them, it is a problem that needs to be her. Same for CAD, COPD, HLD and so on)  A-fib with RVR - Failed outpatient management - Discontinue Cardizem  drip due to patient has signs and symptoms of acute CHF. - Start amiodarone loading x 48 hours, if heart rate not controlled, consider consult cardiology for cardioversion - 1 dose of digoxin - As needed Lopressor  Acute, possibly on chronic HFpEF decompensation - Secondary to A-fib with RVR, revealed patient cardiac history showed she had a stress test last year showing LVEF> 65%, systolic CHF rule out.  Clinically, more suspect patient has a chronic diastolic CHF, exacerbated by A-fib with RVR. - Continue IV Lasix daily - Repeat chest x-ray tomorrow - Echocardiogram ordered for tomorrow once heart rate more controlled.  HTN - Hold off hydrochlorothiazide , start Lasix as above  Hypothyroidism - Check TSH level - Continue Synthroid   History of breast cancer - Continue letrozole   DVT prophylaxis: Xarelto  Code Status: Full code Family Communication: Husband at bedside Disposition Plan: Expect more than 2 midnight hospital stay as patient is sick with A-fib RVR failed outpatient management requiring amiodarone loading for 48 hours, and with signs of new onset of CHF requiring IV diuresis. Consults called: None Admission status: PCU admit   Cort ONEIDA Mana MD [facility] Pager [phone]  08/24/2024, 9:39 AM

## 2024-08-24 NOTE — ED Notes (Addendum)
 Pt transported to xray on monitor. MD aware. Repeat EKG handed to MD

## 2024-08-24 NOTE — ED Notes (Signed)
 Called Pharmacy to verify safety of giving digoxin while pt is on amiodarone. Pharmacy verified safety of administration.

## 2024-08-25 ENCOUNTER — Inpatient Hospital Stay

## 2024-08-25 ENCOUNTER — Inpatient Hospital Stay: Admit: 2024-08-25 | Discharge: 2024-08-25 | Disposition: A | Attending: Internal Medicine

## 2024-08-25 DIAGNOSIS — I5043 Acute on chronic combined systolic (congestive) and diastolic (congestive) heart failure: Secondary | ICD-10-CM

## 2024-08-25 DIAGNOSIS — E876 Hypokalemia: Secondary | ICD-10-CM | POA: Diagnosis not present

## 2024-08-25 DIAGNOSIS — I480 Paroxysmal atrial fibrillation: Secondary | ICD-10-CM

## 2024-08-25 LAB — BASIC METABOLIC PANEL WITH GFR
Anion gap: 10 (ref 5–15)
BUN: 18 mg/dL (ref 8–23)
CO2: 26 mmol/L (ref 22–32)
Calcium: 8.8 mg/dL — ABNORMAL LOW (ref 8.9–10.3)
Chloride: 101 mmol/L (ref 98–111)
Creatinine, Ser: 0.99 mg/dL (ref 0.44–1.00)
GFR, Estimated: >60 mL/min (ref >60)
Glucose, Bld: 124 mg/dL — ABNORMAL HIGH (ref 70–99)
Potassium: 3.4 mmol/L — ABNORMAL LOW (ref 3.5–5.1)
Sodium: 137 mmol/L (ref 135–145)

## 2024-08-25 LAB — ECHOCARDIOGRAM COMPLETE
AR max vel: 2.11 cm2
AV Area VTI: 2.1 cm2
AV Area mean vel: 1.92 cm2
AV Mean grad: 3 mmHg
AV Peak grad: 4.9 mmHg
Ao pk vel: 1.11 m/s
Area-P 1/2: 5.66 cm2
Calc EF: 37.2 %
MV M vel: 4.64 m/s
MV Peak grad: 86.1 mmHg
MV VTI: 1.15 cm2
Radius: 1 cm
S' Lateral: 3.3 cm
Single Plane A2C EF: 44.6 %
Single Plane A4C EF: 34 %

## 2024-08-25 MED ORDER — DILTIAZEM HCL ER COATED BEADS 120 MG PO CP24
240.0000 mg | ORAL_CAPSULE | Freq: Every day | ORAL | Status: DC
  Administered 2024-08-25: 240 mg via ORAL
  Filled 2024-08-25: qty 2

## 2024-08-25 MED ORDER — POTASSIUM CHLORIDE CRYS ER 20 MEQ PO TBCR
40.0000 meq | EXTENDED_RELEASE_TABLET | Freq: Once | ORAL | Status: AC
  Administered 2024-08-25: 40 meq via ORAL
  Filled 2024-08-25: qty 2

## 2024-08-25 MED ORDER — HEPARIN (PORCINE) 25000 UT/250ML-% IV SOLN
1250.0000 [IU]/h | INTRAVENOUS | Status: DC
  Administered 2024-08-26: 1250 [IU]/h via INTRAVENOUS
  Filled 2024-08-25: qty 250

## 2024-08-25 MED ORDER — PERFLUTREN LIPID MICROSPHERE
1.0000 mL | INTRAVENOUS | Status: AC | PRN
  Administered 2024-08-25: 2 mL via INTRAVENOUS

## 2024-08-25 MED ORDER — LACTULOSE 10 GM/15ML PO SOLN
20.0000 g | Freq: Once | ORAL | Status: AC
  Administered 2024-08-25: 20 g via ORAL
  Filled 2024-08-25: qty 30

## 2024-08-25 MED ORDER — FREE WATER
250.0000 mL | Freq: Once | Status: AC
  Administered 2024-08-26: 250 mL via ORAL

## 2024-08-25 MED ORDER — AMIODARONE HCL 200 MG PO TABS
200.0000 mg | ORAL_TABLET | Freq: Every day | ORAL | Status: DC
  Administered 2024-08-25 – 2024-08-26 (×2): 200 mg via ORAL
  Filled 2024-08-25 (×2): qty 1

## 2024-08-25 MED ORDER — EMPAGLIFLOZIN 10 MG PO TABS
10.0000 mg | ORAL_TABLET | Freq: Every day | ORAL | Status: DC
  Administered 2024-08-25 – 2024-08-31 (×7): 10 mg via ORAL
  Filled 2024-08-25 (×7): qty 1

## 2024-08-25 MED ORDER — ASPIRIN 81 MG PO CHEW
81.0000 mg | CHEWABLE_TABLET | ORAL | Status: AC
Start: 2024-08-26 — End: 2024-08-26
  Administered 2024-08-26: 81 mg via ORAL
  Filled 2024-08-25: qty 1

## 2024-08-25 MED ORDER — VENLAFAXINE HCL ER 37.5 MG PO CP24
37.5000 mg | ORAL_CAPSULE | Freq: Every day | ORAL | Status: DC
  Administered 2024-08-25 – 2024-08-31 (×6): 37.5 mg via ORAL
  Filled 2024-08-25 (×7): qty 1

## 2024-08-25 MED ORDER — FLEET ENEMA RE ENEM
1.0000 | ENEMA | Freq: Once | RECTAL | Status: AC
  Administered 2024-08-25: 1 via RECTAL

## 2024-08-25 MED ORDER — METOPROLOL SUCCINATE ER 25 MG PO TB24
25.0000 mg | ORAL_TABLET | Freq: Two times a day (BID) | ORAL | Status: DC
  Administered 2024-08-25 – 2024-08-27 (×4): 25 mg via ORAL
  Filled 2024-08-25 (×4): qty 1

## 2024-08-25 NOTE — Progress Notes (Signed)
  Progress Note   Patient: Susan Myers FMW:[id number] DOB: 1951-04-24 DOA: 08/24/2024     1 DOS: the patient was seen and examined on 08/25/2024   Brief hospital course: Susan Myers is a 73 y.o. female with medical history significant of PAF on Xarelto , HTN, breast cancer status post lumpectomy radiation, on chronic hormonal manipulation, hypothyroidism, presented with worsening of cough shortness of breath and peripheral edema.  Upon arriving to hospital, she was found to have atrial fibrillation with RVR.  Amiodarone was started.  Patient was also given beta-blocker. Patient is seen by cardiology, echocardiogram showed ejection fraction of 35%.  Started on IV Lasix for acute exacerbation of congestive heart failure.  Heart cath scheduled for 10/3.    Active Problems:   A-fib (HCC)   Acute on chronic combined systolic and diastolic CHF (congestive heart failure) (HCC)   Hypokalemia   Paroxysmal atrial fibrillation with RVR (HCC)   Assessment and Plan: Paroxysmal atrial fibrillation with RVR. Patient has been followed by cardiology, currently on amiodarone and heparin  drip.  Added beta-blocker. Currently has converted to sinus rhythm.  Acute on chronic combined systolic and diastolic congestive heart failure. Severe mitral regurgitation. Severe tricuspid regurgitation. Prior echocardiogram showed normal ejection fraction, currently ejection fraction was 35% to 40% with grade 3 diastolic dysfunction, severe mitral regurgitation and severe tricuspid regurgitation. Patient currently treated with IV Lasix.  Beta-blocker. Plan heart cath for tomorrow.  Hypokalemia. Repleted, recheck level tomorrow  Essential hypertension. Continue beta-blocker.  Hypothyroidism. Continue Synthroid .  TSH normal.  History of breast cancer. On letrozole .      Subjective:  Patient has some short of breath, but much better than yesterday.  No chest pain.  Physical Exam: Vitals:    08/25/24 1020 08/25/24 1057 08/25/24 1103 08/25/24 1253  BP: 98/87   115/83  Pulse:    92  Resp: (!) 28 19 16 17   Temp:      TempSrc:      SpO2:    94%   General exam: Appears calm and comfortable  Respiratory system: Few crackles in the base. Respiratory effort normal. Cardiovascular system: S1 & S2 heard, RRR. No JVD, murmurs, rubs, gallops or clicks. No pedal edema. Gastrointestinal system: Abdomen is nondistended, soft and nontender. No organomegaly or masses felt. Normal bowel sounds heard. Central nervous system: Alert and oriented. No focal neurological deficits. Extremities: Symmetric 5 x 5 power. Skin: No rashes, lesions or ulcers Psychiatry: Judgement and insight appear normal. Mood & affect appropriate.    Data Reviewed:  Reviewed chest x-ray and lab results.  Family Communication: Husband updated at the bedside.  Disposition: Status is: Inpatient Remains inpatient appropriate because: Severity of disease, IV treatment, inpatient procedure     Time spent: 55 minutes  Author: Murvin Mana, MD 08/25/2024 1:40 PM  For on call review [email or web address].

## 2024-08-25 NOTE — Hospital Course (Signed)
 Susan Myers is a 73 y.o. female with medical history significant of PAF on Xarelto , HTN, breast cancer status post lumpectomy radiation, on chronic hormonal manipulation, hypothyroidism, presented with worsening of cough shortness of breath and peripheral edema.  Upon arriving to hospital, she was found to have atrial fibrillation with RVR.  Amiodarone was started.  Patient was also given beta-blocker. Patient is seen by cardiology, echocardiogram showed ejection fraction of 35%.  Started on IV Lasix for acute exacerbation of congestive heart failure.  Heart cath scheduled for 10/3.

## 2024-08-25 NOTE — Progress Notes (Signed)
 Heart Failure Navigator Progress Note  Assessed for Heart & Vascular TOC clinic readiness.  Patient does not meet criteria due to current [facility] Cardiology patient.   Navigator will sign off at this time.  Roxy Horseman, RN, BSN [facility] Heart Failure Navigator Secure Chat Only

## 2024-08-25 NOTE — Plan of Care (Signed)
  Problem: Clinical Measurements: Goal: Ability to maintain clinical measurements within normal limits will improve Outcome: Progressing   Problem: Clinical Measurements: Goal: Cardiovascular complication will be avoided Outcome: Progressing   Problem: Activity: Goal: Risk for activity intolerance will decrease Outcome: Progressing   Problem: Pain Managment: Goal: General experience of comfort will improve and/or be controlled Outcome: Progressing   Problem: Safety: Goal: Ability to remain free from injury will improve Outcome: Progressing

## 2024-08-25 NOTE — Progress Notes (Signed)
 PHARMACY - ANTICOAGULATION CONSULT NOTE  Pharmacy Consult for heparin  infusion Indication: chest pain/ACS  Allergies  Allergen Reactions   Levaquin [Levofloxacin In D5w] Hives   Macrolides And Ketolides Other (See Comments)    WHELPS WHELPS WHELPS   Penicillins Itching    Can take amoxicillin   Has patient had a PCN reaction causing immediate rash, facial/tongue/throat swelling, SOB or lightheadedness with hypotension:No Has patient had a PCN reaction causing severe rash involving mucus membranes or skin necrosis:unsure Has patient had a PCN reaction that required hospitalization:No Has patient had a PCN reaction occurring within the last 10 years: No If all of the above answers are NO, then may proceed with Cephalosporin use.   Sulfa Antibiotics Hives    Patient Measurements:    Vital Signs: Temp: 97.8 F (36.6 C) (10/02 0743) Temp Source: Oral (10/02 0743) BP: 98/87 (10/02 1020) Pulse Rate: 88 (10/02 0743)  Labs: Recent Labs    08/24/24 0708 08/24/24 0908 08/25/24 0418  HGB 12.9  --   --   HCT 39.9  --   --   PLT 161  --   --   CREATININE 0.88  --  0.99  TROPONINIHS 5 5  --     CrCl cannot be calculated (Unknown ideal weight.).   Medical History: Past Medical History:  Diagnosis Date   Actinic keratosis 01/10/2019   mid vertex scalp   Breast cancer (HCC) 2019   invasive mammary carcinoma, lumpectomy and rad tx   Complication of anesthesia    Delusional disorder (HCC)    DVT (deep venous thrombosis) (HCC) 06/2016   Dysplastic nevus 07/24/2020   R med calf - mod   GERD (gastroesophageal reflux disease)    History of kidney stones    Hx of blood clots    Hypercholesterolemia    Hypertension    Hypothyroidism    Kidney stones    Obesity    Personal history of radiation therapy 2019   right breast cancer   PONV (postoperative nausea and vomiting)    Prediabetes    Thyroid  disease     Medications:  Scheduled:   amiodarone  200 mg Oral Daily    docusate sodium   100 mg Oral BID   empagliflozin  10 mg Oral Daily   furosemide  40 mg Intravenous Daily   levothyroxine   125 mcg Oral Daily   metoprolol succinate  25 mg Oral BID   risperiDONE   1 mg Oral QHS   simvastatin   20 mg Oral QPM   venlafaxine XR  37.5 mg Oral Q breakfast   Infusions:   amiodarone 30 mg/hr (08/25/24 0555)   PRN: acetaminophen  **OR** acetaminophen , benzonatate , metoprolol tartrate, ondansetron  **OR** ondansetron  (ZOFRAN ) IV, polyethylene glycol  Assessment: 73 y.o. female w/ PMH of PAF on Xarelto , HTN, breast cancer status post lumpectomy radiation, on chronic hormonal manipulation, hypothyroidism, presented with worsening of cough shortness of breath and peripheral edema. She is noted to be taking rivaroxaban  prior to admission with last dose reported 10/02 0808  Goal of Therapy:  anti-Xa level 0.3-0.7 units/ml aPTT 66 - 102 seconds Monitor platelets by anticoagulation protocol: Yes   Plan:  ---Start heparin  infusion at 1250 units/hr beginning when next scheduled dose of rivaroxaban  is due (10/03 0800) / no loading dose required ---Check aPTT level in 8 hours after initiation (we will use aPTT to guide therapy until aPTT and anti-Xa level correlate) ---Continue to monitor H&H and platelets  Susan Myers 08/25/2024,12:12 PM

## 2024-08-25 NOTE — Consult Note (Signed)
 [facility] CLINIC CARDIOLOGY CONSULT NOTE       Patient ID: Susan Myers MRN: [id number] DOB/AGE: 1951-03-31 73 y.o.  Admit date: 08/24/2024 Referring Physician Dr Laurita Primary Physician Sparks, Reyes BIRCH, MD Primary Cardiologist Dr Florencio Reason for Consultation Afib RVR  HPI: Susan Myers is a 73 y.o. female  with a past medical history of HTN, HLD, and Afib who presented to the ED on 08/24/2024 for SOB and Afib RVR. Patient did convert back to NSR on amiodarone drip during her echocardiogram. However, her echo shows newly reduced LVEF with wall motion abnormalities. Of note, she did have covid a few weeks ago and states her SOB has not really improved since then. Patient has been experiencing racing heart over the summer but not as severe as it was when she first came in. No longer having heart racing episodes but still SOB with minimal exertion. Denies chest pain, diaphoresis, syncope.  Review of systems complete and found to be negative unless listed above     Past Medical History:  Diagnosis Date   Actinic keratosis 01/10/2019   mid vertex scalp   Breast cancer (HCC) 2019   invasive mammary carcinoma, lumpectomy and rad tx   Complication of anesthesia    Delusional disorder (HCC)    DVT (deep venous thrombosis) (HCC) 06/2016   Dysplastic nevus 07/24/2020   R med calf - mod   GERD (gastroesophageal reflux disease)    History of kidney stones    Hx of blood clots    Hypercholesterolemia    Hypertension    Hypothyroidism    Kidney stones    Obesity    Personal history of radiation therapy 2019   right breast cancer   PONV (postoperative nausea and vomiting)    Prediabetes    Thyroid  disease     Past Surgical History:  Procedure Laterality Date   ABDOMINAL HYSTERECTOMY     APPENDECTOMY     BREAST BIOPSY Right 03/23/2018   Affirm Bx- invasive mammary carcinoma   BREAST CYST ASPIRATION Bilateral 1990   BREAST LUMPECTOMY Right 2019   invasive mammary  carcinoma, clear surgical margins   COLONOSCOPY     IVC FILTER INSERTION     PARTIAL MASTECTOMY WITH NEEDLE LOCALIZATION Right 04/01/2018   Procedure: PARTIAL MASTECTOMY WITH NEEDLE LOCALIZATION;  Surgeon: Rodolph Romano, MD;  Location: [facility] ORS;  Service: General;  Laterality: Right;   PERIPHERAL VASCULAR CATHETERIZATION N/A 08/06/2016   Procedure: IVC Filter Insertion;  Surgeon: Cordella KANDICE Shawl, MD;  Location: [facility] INVASIVE CV LAB;  Service: Cardiovascular;  Laterality: N/A;   PERIPHERAL VASCULAR CATHETERIZATION Left 08/06/2016   Procedure: Lower Extremity Venography;  Surgeon: Cordella KANDICE Shawl, MD;  Location: [facility] INVASIVE CV LAB;  Service: Cardiovascular;  Laterality: Left;   PERIPHERAL VASCULAR CATHETERIZATION  08/06/2016   Procedure: Lower Extremity Intervention;  Surgeon: Cordella KANDICE Shawl, MD;  Location: [facility] INVASIVE CV LAB;  Service: Cardiovascular;;   PERIPHERAL VASCULAR CATHETERIZATION N/A 12/16/2016   Procedure: IVC Filter Removal;  Surgeon: Cordella KANDICE Shawl, MD;  Location: [facility] INVASIVE CV LAB;  Service: Cardiovascular;  Laterality: N/A;   SENTINEL NODE BIOPSY Right 04/01/2018   Procedure: SENTINEL NODE BIOPSY;  Surgeon: Rodolph Romano, MD;  Location: [facility] ORS;  Service: General;  Laterality: Right;   TONSILLECTOMY      Medications Prior to Admission  Medication Sig Dispense Refill Last Dose/Taking   acetaminophen  (TYLENOL ) 500 MG tablet Take 500 mg by mouth every 6 (six) hours as needed for moderate pain  or headache.   Unknown   alendronate  (FOSAMAX ) 70 MG tablet TAKE 1 TABLET(70 MG) BY MOUTH 1 TIME A WEEK WITH A FULL GLASS OF WATER AND ON AN EMPTY STOMACH 12 tablet 3 08/21/2024   amiodarone (PACERONE) 200 MG tablet Take 200 mg by mouth 2 (two) times daily.   08/24/2024 Morning   benzonatate  (TESSALON ) 200 MG capsule Take 1 capsule (200 mg total) by mouth 3 (three) times daily as needed for Cough   Unknown   Cholecalciferol (VITAMIN D PO) Take 1 tablet by mouth once a  week.   Past Week   desvenlafaxine (PRISTIQ) 25 MG 24 hr tablet Take 25 mg by mouth daily.   08/23/2024   diltiazem  (CARDIZEM  CD) 120 MG 24 hr capsule Take 1 capsule (120 mg total) by mouth daily. 30 capsule 0 08/24/2024 Morning   hydrochlorothiazide  (HYDRODIURIL ) 25 MG tablet Take 25 mg by mouth daily.   08/23/2024   levothyroxine  (SYNTHROID ) 125 MCG tablet Take 125 mcg by mouth daily.   08/24/2024 Morning   meloxicam  (MOBIC ) 7.5 MG tablet Take 7.5 mg by mouth daily as needed for pain.   Unknown   omeprazole (PRILOSEC) 40 MG capsule Take 40 mg by mouth daily.   Unknown   phenylephrine  (SUDAFED PE) 10 MG TABS tablet Take 30 mg by mouth every 6 (six) hours as needed (congestion).   Past Week   risperiDONE  (RISPERDAL ) 1 MG tablet Take 1 mg by mouth at bedtime.   08/23/2024   rivaroxaban  (XARELTO ) 20 MG TABS tablet Take 20 mg by mouth daily.   08/24/2024 Morning   simvastatin  (ZOCOR ) 20 MG tablet Take 20 mg by mouth every evening.    08/23/2024   Social History   Socioeconomic History   Marital status: Married    Spouse name: Not on file   Number of children: Not on file   Years of education: Not on file   Highest education level: Not on file  Occupational History   Not on file  Tobacco Use   Smoking status: Never   Smokeless tobacco: Never  Vaping Use   Vaping status: Never Used  Substance and Sexual Activity   Alcohol use: No   Drug use: No   Sexual activity: Not Currently  Other Topics Concern   Not on file  Social History Narrative   Not on file   Social Drivers of Health   Financial Resource Strain: Low Risk  (12/25/2023)   [facility] from [facility]   Overall Financial Resource Strain (CARDIA)    Difficulty of Paying Living Expenses: Not hard at all  Food Insecurity: No Food Insecurity (08/24/2024)   Hunger Vital Sign    Worried About Running Out of Food in the Last Year: Never true    Ran Out of Food in the Last Year: Never true  Transportation Needs: No  Transportation Needs (08/24/2024)   PRAPARE - Administrator, Civil Service (Medical): No    Lack of Transportation (Non-Medical): No  Physical Activity: Not on file  Stress: Not on file  Social Connections: Socially Integrated (08/24/2024)   Social Connection and Isolation Panel    Frequency of Communication with Friends and Family: More than three times a week    Frequency of Social Gatherings with Friends and Family: More than three times a week    Attends Religious Services: More than 4 times per year    Active Member of [employer] or Organizations: Yes    Attends [employer]  or Organization Meetings: More than 4 times per year    Marital Status: Married  Catering manager Violence: Not At Risk (08/24/2024)   Humiliation, Afraid, Rape, and Kick questionnaire    Fear of Current or Ex-Partner: No    Emotionally Abused: No    Physically Abused: No    Sexually Abused: No    Family History  Problem Relation Age of Onset   Breast cancer Neg Hx    Prostate cancer Neg Hx    Kidney cancer Neg Hx      Vitals:   08/25/24 1020 08/25/24 1057 08/25/24 1103 08/25/24 1253  BP: 98/87   115/83  Pulse:    92  Resp: (!) 28 19 16 17   Temp:      TempSrc:      SpO2:    94%    PHYSICAL EXAM General: awake, well nourished, in no acute distress. HEENT: Normocephalic and atraumatic. Neck: No JVD.  Lungs: Normal respiratory effort. Clear bilaterally to auscultation. No wheezes, crackles, rhonchi.  Heart: HRRR. Normal S1 and S2 without gallops or murmurs.  Abdomen: Non-distended appearing.  Msk: Normal strength and tone for age. Extremities: Warm and well perfused. No clubbing, cyanosis. no edema.  Neuro: Alert and oriented X 3. Psych: Answers questions appropriately.   Labs: Basic Metabolic Panel: Recent Labs    08/24/24 0708 08/25/24 0418  NA 139 137  K 3.5 3.4*  CL 102 101  CO2 24 26  GLUCOSE 140* 124*  BUN 18 18  CREATININE 0.88 0.99  CALCIUM 9.3 8.8*  MG 2.0  --    Liver  Function Tests: No results for input(s): AST, ALT, ALKPHOS, BILITOT, PROT, ALBUMIN in the last 72 hours. No results for input(s): LIPASE, AMYLASE in the last 72 hours. CBC: Recent Labs    08/24/24 0708  WBC 8.7  HGB 12.9  HCT 39.9  MCV 94.5  PLT 161   Cardiac Enzymes: Recent Labs    08/24/24 0708 08/24/24 0908  TROPONINIHS 5 5   BNP: Recent Labs    08/24/24 0708  BNP 200.0*   D-Dimer: No results for input(s): DDIMER in the last 72 hours. Hemoglobin A1C: No results for input(s): HGBA1C in the last 72 hours. Fasting Lipid Panel: No results for input(s): CHOL, HDL, LDLCALC, TRIG, CHOLHDL, LDLDIRECT in the last 72 hours. Thyroid  Function Tests: Recent Labs    08/24/24 0708  TSH 2.947   Anemia Panel: No results for input(s): VITAMINB12, FOLATE, FERRITIN, TIBC, IRON, RETICCTPCT in the last 72 hours.   Radiology: ECHOCARDIOGRAM COMPLETE Result Date: 08/25/2024    ECHOCARDIOGRAM REPORT   Patient Name:   Susan Myers Date of Exam: 08/25/2024 Medical Rec #:  [id number]          Myers:       63.0 in Accession #:    [id number]         Weight:       200.0 lb Date of Birth:  09-Jun-1951         BSA:          1.934 m Patient Age:    72 years           BP:           117/72 mmHg Patient Gender: F                  HR:           79 bpm. Exam Location:  [facility] Procedure: 2D Echo, Cardiac  Doppler, Color Doppler and Intracardiac            Opacification Agent (Both Spectral and Color Flow Doppler were            utilized during procedure). REPORT CONTAINS CRITICAL RESULT Indications:     CHF-Acute Diastolic I50.31  History:         Patient has no prior history of Echocardiogram examinations.                  CHF; Arrythmias:Atrial Fibrillation.  Sonographer:     Rosina Dunk Referring Phys:  [id number] CORT ONEIDA MANA Diagnosing Phys: Annalee Nello Corro IMPRESSIONS  1. Left ventricular ejection fraction, by estimation, is 35 to 40%. Left ventricular  ejection fraction by 2D MOD biplane is 37.2 %. The left ventricle has moderately decreased function. The left ventricle demonstrates regional wall motion abnormalities (see scoring diagram/findings for description). Left ventricular diastolic parameters are consistent with Grade III diastolic dysfunction (restrictive).  2. Right ventricular systolic function is normal. The right ventricular size is normal.  3. Left atrial size was mildly dilated.  4. The mitral valve is abnormal. Severe mitral valve regurgitation. No evidence of mitral stenosis.  5. The tricuspid valve is abnormal. Tricuspid valve regurgitation is severe.  6. The aortic valve is normal in structure. Aortic valve regurgitation is not visualized. No aortic stenosis is present.  7. The inferior vena cava is normal in size with greater than 50% respiratory variability, suggesting right atrial pressure of 3 mmHg. FINDINGS  Left Ventricle: Left ventricular ejection fraction, by estimation, is 35 to 40%. Left ventricular ejection fraction by 2D MOD biplane is 37.2 %. The left ventricle has moderately decreased function. The left ventricle demonstrates regional wall motion abnormalities. Definity contrast agent was given IV to delineate the left ventricular endocardial borders. The left ventricular internal cavity size was normal in size. There is no left ventricular hypertrophy. Left ventricular diastolic parameters are consistent with Grade III diastolic dysfunction (restrictive).  LV Wall Scoring: The inferior septum is hypokinetic. Right Ventricle: The right ventricular size is normal. No increase in right ventricular wall thickness. Right ventricular systolic function is normal. Left Atrium: Left atrial size was mildly dilated. Right Atrium: Right atrial size was normal in size. Pericardium: There is no evidence of pericardial effusion. Mitral Valve: The mitral valve is abnormal. Severe mitral valve regurgitation. No evidence of mitral valve stenosis.  MV peak gradient, 9.6 mmHg. The mean mitral valve gradient is 4.0 mmHg. Tricuspid Valve: The tricuspid valve is abnormal. Tricuspid valve regurgitation is severe. Aortic Valve: The aortic valve is normal in structure. Aortic valve regurgitation is not visualized. No aortic stenosis is present. Aortic valve mean gradient measures 3.0 mmHg. Aortic valve peak gradient measures 4.9 mmHg. Aortic valve area, by VTI measures 2.10 cm. Pulmonic Valve: The pulmonic valve was normal in structure. Pulmonic valve regurgitation is not visualized. Aorta: The aortic root is normal in size and structure. Venous: The inferior vena cava is normal in size with greater than 50% respiratory variability, suggesting right atrial pressure of 3 mmHg. IAS/Shunts: No atrial level shunt detected by color flow Doppler.  LEFT VENTRICLE PLAX 2D                        Biplane EF (MOD) LVIDd:         3.90 cm         LV Biplane EF:   Left LVIDs:  3.30 cm                          ventricular LV PW:         1.10 cm                          ejection LV IVS:        1.20 cm                          fraction by LVOT diam:     1.70 cm                          2D MOD LV SV:         37                               biplane is LV SV Index:   19                               37.2 %. LVOT Area:     2.27 cm                                Diastology                                LV e' medial:    7.94 cm/s LV Volumes (MOD)               LV E/e' medial:  20.3 LV vol d, MOD    99.0 ml       LV e' lateral:   9.46 cm/s A2C:                           LV E/e' lateral: 17.0 LV vol d, MOD    75.4 ml A4C: LV vol s, MOD    54.8 ml A2C: LV vol s, MOD    49.8 ml A4C: LV SV MOD A2C:   44.2 ml LV SV MOD A4C:   75.4 ml LV SV MOD BP:    32.5 ml RIGHT VENTRICLE RV Basal diam:  4.20 cm RV Mid diam:    2.90 cm RV S prime:     12.60 cm/s TAPSE (M-mode): 2.1 cm LEFT ATRIUM             Index        RIGHT ATRIUM           Index LA diam:        3.10 cm 1.60 cm/m   RA Area:      14.80 cm LA Vol (A2C):   68.9 ml 35.63 ml/m  RA Volume:   35.80 ml  18.51 ml/m LA Vol (A4C):   39.4 ml 20.38 ml/m LA Biplane Vol: 54.3 ml 28.08 ml/m  AORTIC VALVE                    PULMONIC VALVE AV Area (Vmax):    2.11 cm     PV Vmax:        0.64 m/s AV  Area (Vmean):   1.92 cm     PV Vmean:       44.900 cm/s AV Area (VTI):     2.10 cm     PV VTI:         0.093 m AV Vmax:           111.00 cm/s  PV Peak grad:   1.6 mmHg AV Vmean:          77.000 cm/s  PV Mean grad:   1.0 mmHg AV VTI:            0.177 m      RVOT Peak grad: 2 mmHg AV Peak Grad:      4.9 mmHg AV Mean Grad:      3.0 mmHg LVOT Vmax:         103.00 cm/s LVOT Vmean:        65.300 cm/s LVOT VTI:          0.164 m LVOT/AV VTI ratio: 0.93  AORTA Ao Root diam: 2.50 cm Ao Asc diam:  2.90 cm MITRAL VALVE                  TRICUSPID VALVE MV Area (PHT): 5.66 cm       TR Peak grad:   29.8 mmHg MV Area VTI:   1.15 cm       TR Mean grad:   22.0 mmHg MV Peak grad:  9.6 mmHg       TR Vmax:        273.00 cm/s MV Mean grad:  4.0 mmHg       TR Vmean:       225.0 cm/s MV Vmax:       1.55 m/s MV Vmean:      94.2 cm/s      SHUNTS MV Decel Time: 134 msec       Systemic VTI:  0.16 m MR Peak grad:    86.1 mmHg    Systemic Diam: 1.70 cm MR Mean grad:    56.0 mmHg    Pulmonic VTI:  0.131 m MR Vmax:         464.00 cm/s MR Vmean:        354.0 cm/s MR PISA:         6.28 cm MR PISA Eff ROA: 52 mm MR PISA Radius:  1.00 cm MV E velocity: 161.00 cm/s MV A velocity: 57.00 cm/s MV E/A ratio:  2.82 Yomar Mejorado Electronically signed by Annalee Casa Signature Date/Time: 08/25/2024/12:02:38 PM    Final    DG Chest 1 View Result Date: 08/25/2024 CLINICAL DATA:  02706 CHF (congestive heart failure) (HCC) 02706 EXAM: CHEST  1 VIEW COMPARISON:  08/24/2024 FINDINGS: Central pulmonary vascular congestion. No focal airspace consolidation or pneumothorax. Trace bilateral pleural effusions. Mild cardiomegaly. Aortic atherosclerosis. No acute fracture or destructive lesions.  Multilevel thoracic osteophytosis. IMPRESSION: Mild cardiomegaly with central pulmonary vascular congestion. Trace bilateral pleural effusions. Electronically Signed   By: Rogelia Myers M.D.   On: 08/25/2024 07:28   DG Chest 2 View Result Date: 08/24/2024 CLINICAL DATA:  73 year old female with shortness of breath. EXAM: CHEST - 2 VIEW COMPARISON:  CTA chest 08/02/2024 and earlier. FINDINGS: PA and lateral views 0714 hours. Small pleural effusions on CTA earlier this month persist. Slightly lower lung volume since that time. Stable cardiac size and mediastinal contours. Nonspecific symmetric pulmonary interstitial opacity has not significantly changed over this series of exams. No pneumothorax. No consolidation or  confluent opacity. Osteopenia. No acute osseous abnormality identified. IVC filter partially visible, obliquely oriented. Negative visible bowel gas. IMPRESSION: 1. Unresolved small pleural effusions and lower lung volumes from radiographs and CTA earlier this month. Nonspecific symmetric increased interstitium not significantly changed. Considerations include chronic interstitial disease, interstitial edema, and less likely viral/atypical respiratory infection. 2. No new cardiopulmonary abnormality.  IVC filter visible. Electronically Signed   By: VEAR Hurst M.D.   On: 08/24/2024 07:47   CT Angio Chest PE W and/or Wo Contrast Result Date: 08/02/2024 CLINICAL DATA:  Tachycardia, abnormal EKG EXAM: CT ANGIOGRAPHY CHEST WITH CONTRAST TECHNIQUE: Multidetector CT imaging of the chest was performed using the standard protocol during bolus administration of intravenous contrast. Multiplanar CT image reconstructions and MIPs were obtained to evaluate the vascular anatomy. RADIATION DOSE REDUCTION: This exam was performed according to the departmental dose-optimization program which includes automated exposure control, adjustment of the mA and/or kV according to patient size and/or use of iterative  reconstruction technique. CONTRAST:  75mL OMNIPAQUE  IOHEXOL  350 MG/ML SOLN COMPARISON:  08/02/2024 FINDINGS: Cardiovascular: This is a technically adequate evaluation of the pulmonary vasculature. No filling defects or pulmonary emboli. The heart is unremarkable without pericardial effusion. Normal caliber of the thoracic aorta. Atherosclerosis of the aortic arch. Coronary artery atherosclerosis greatest in the LAD and right coronary distributions. Mediastinum/Nodes: No enlarged mediastinal, hilar, or axillary lymph nodes. Thyroid  gland, trachea, and esophagus demonstrate no significant findings. Lungs/Pleura: Trace bilateral pleural effusions. Minimal bibasilar hypoventilatory changes. No airspace disease or pneumothorax. Central airways are patent. Upper Abdomen: 1.3 cm left adrenal nodule measures 2 HU, compatible with benign adrenal adenoma. Nonobstructing 7 mm calculus upper pole left kidney. Musculoskeletal: No acute or destructive bony abnormalities. Reconstructed images demonstrate no additional findings. Review of the MIP images confirms the above findings. IMPRESSION: 1. No evidence of pulmonary embolus. 2. Trace free-flowing bilateral pleural effusions. 3. Nonobstructing 7 mm left renal calculus. 4. Left adrenal mass measuring 1.3 cm, consistent with lipid-rich benign adenoma. No follow-up imaging is recommended. JACR 2017 Aug; 14(8):1038-44, JCAT 2016 Mar-Apr; 40(2):194-200, Urol J 2006 Spring; 3(2):71-4. 5. Aortic Atherosclerosis (ICD10-I70.0). Coronary artery atherosclerosis. Electronically Signed   By: Ozell Daring M.D.   On: 08/02/2024 17:46   DG Chest 2 View Result Date: 08/02/2024 CLINICAL DATA:  Elevated heart rate with an abnormal EKG. EXAM: CHEST - 2 VIEW COMPARISON:  August 02, 2024 FINDINGS: The heart size and mediastinal contours are within normal limits. Mild, chronic appearing increased interstitial lung markings are seen. Very small, stable bilateral pleural effusions are seen. There  is no evidence of focal consolidation or pneumothorax. An IVC filter is seen within the visualized portion of the abdomen. The visualized skeletal structures are unremarkable. IMPRESSION: 1. Chronic appearing increased interstitial lung markings. 2. Very small, stable bilateral pleural effusions. Electronically Signed   By: Suzen Dials M.D.   On: 08/02/2024 15:47   DG Chest 2 View Result Date: 08/02/2024 EXAM: 2 VIEW(S) XRAY OF THE CHEST 08/02/2024 01:33:34 PM COMPARISON: None available. CLINICAL HISTORY: Cough \\T \ wheezing x1 mon. Pt c/o cough \\T \ wheezing x4 wks. Denies any hx of asthma or COPD. Was given tessalon  by PCP on 9/4. FINDINGS: LUNGS AND PLEURA: Pulmonary interstitial prominence compatible with mild pulmonary edema. Trace bilateral pleural effusions. No pneumothorax. HEART AND MEDIASTINUM: No acute abnormality of the cardiac and mediastinal silhouettes. BONES AND SOFT TISSUES: No acute osseous abnormality. IMPRESSION: 1. Pulmonary interstitial prominence with mild pulmonary edema. 2. Trace bilateral pleural effusions. Electronically signed by: Waddell  Stroud MD 08/02/2024 02:03 PM EDT RP Workstation: [id number]    ECHO as above  TELEMETRY reviewed by me [facility]) 08/25/2024 : NSR  EKG reviewed by me: Afib RVR  Data reviewed by me [facility]) 08/25/2024: last 24h vitals tele labs imaging I/O provider notes  Active Problems:   A-fib (HCC)   Acute on chronic diastolic CHF (congestive heart failure) (HCC)   Hypokalemia   Paroxysmal atrial fibrillation with RVR (HCC)    ASSESSMENT AND PLAN:   Afib RVR, now resolved New cardiomyopathy LVEF 35% with WMA - potentially multifactorial (tachy-induced, covid, or ischemia) Stop xarelto  and start heparin  gtt. Discontinue cardizem  indefinitely given reduced LVEF. Will start Toprol XL 25mg  BID. PRN IV lopressor ordered as well. Jardiance 10mg  daily started. Home amiodarone restarted, 200mg  daily. Can discontinue amio gtt this afternoon  ~3pm. Maintain on telemetry. Optimize lytes, K>4 and Mag>2. Plan for LHC with possible intervention tomorrow 10/3 with Dr Florencio at 12:30pm. CLD after midnight. Discussed with patient and son at bedside, patient agreeable to proceed.   Signed: Clarrisa Kaylor, DO 08/25/2024, 1:02 PM [facility]

## 2024-08-25 NOTE — Plan of Care (Signed)
  Problem: Clinical Measurements: Goal: Ability to maintain clinical measurements within normal limits will improve Outcome: Progressing   Problem: Pain Managment: Goal: General experience of comfort will improve and/or be controlled Outcome: Progressing   Problem: Safety: Goal: Ability to remain free from injury will improve Outcome: Progressing   Problem: Clinical Measurements: Goal: Respiratory complications will improve Outcome: Progressing   Problem: Clinical Measurements: Goal: Cardiovascular complication will be avoided Outcome: Progressing

## 2024-08-26 ENCOUNTER — Telehealth (HOSPITAL_COMMUNITY): Payer: Self-pay | Admitting: Pharmacy Technician

## 2024-08-26 ENCOUNTER — Other Ambulatory Visit (HOSPITAL_COMMUNITY): Payer: Self-pay

## 2024-08-26 ENCOUNTER — Encounter
Admission: EM | Disposition: A | Discharge status: Home / Self Care | Payer: Self-pay | Source: Home / Self Care | Attending: Internal Medicine

## 2024-08-26 DIAGNOSIS — I5043 Acute on chronic combined systolic (congestive) and diastolic (congestive) heart failure: Secondary | ICD-10-CM | POA: Diagnosis not present

## 2024-08-26 DIAGNOSIS — E876 Hypokalemia: Secondary | ICD-10-CM | POA: Diagnosis not present

## 2024-08-26 DIAGNOSIS — I480 Paroxysmal atrial fibrillation: Secondary | ICD-10-CM | POA: Diagnosis not present

## 2024-08-26 HISTORY — PX: LEFT HEART CATH AND CORONARY ANGIOGRAPHY: CATH118249

## 2024-08-26 LAB — CBC
HCT: 39.4 % (ref 36.0–46.0)
Hemoglobin: 13 g/dL (ref 12.0–15.0)
MCH: 31.3 pg (ref 26.0–34.0)
MCHC: 33 g/dL (ref 30.0–36.0)
MCV: 94.7 fL (ref 80.0–100.0)
Platelets: 188 K/uL (ref 150–400)
RBC: 4.16 MIL/uL (ref 3.87–5.11)
RDW: 14.7 % (ref 11.5–15.5)
WBC: 9.3 K/uL (ref 4.0–10.5)
nRBC: 0 % (ref 0.0–0.2)

## 2024-08-26 LAB — BASIC METABOLIC PANEL WITH GFR
Anion gap: 12 (ref 5–15)
BUN: 21 mg/dL (ref 8–23)
CO2: 26 mmol/L (ref 22–32)
Calcium: 9.1 mg/dL (ref 8.9–10.3)
Chloride: 104 mmol/L (ref 98–111)
Creatinine, Ser: 1.07 mg/dL — ABNORMAL HIGH (ref 0.44–1.00)
GFR, Estimated: 55 mL/min — ABNORMAL LOW (ref >60)
Glucose, Bld: 115 mg/dL — ABNORMAL HIGH (ref 70–99)
Potassium: 3.6 mmol/L (ref 3.5–5.1)
Sodium: 142 mmol/L (ref 135–145)

## 2024-08-26 LAB — MAGNESIUM: Magnesium: 2.3 mg/dL (ref 1.7–2.4)

## 2024-08-26 LAB — HEPARIN LEVEL (UNFRACTIONATED): Heparin Unfractionated: >1.1 [IU]/mL — ABNORMAL HIGH (ref 0.30–0.70)

## 2024-08-26 LAB — APTT: aPTT: 64 s — ABNORMAL HIGH (ref 24–36)

## 2024-08-26 SURGERY — LEFT HEART CATH AND CORONARY ANGIOGRAPHY
Anesthesia: Moderate Sedation

## 2024-08-26 MED ORDER — MIDAZOLAM HCL 2 MG/2ML IJ SOLN
INTRAMUSCULAR | Status: DC | PRN
  Administered 2024-08-26: 1 mg via INTRAVENOUS

## 2024-08-26 MED ORDER — SODIUM CHLORIDE 0.9% FLUSH: 3.0000 mL | INTRAVENOUS | Status: DC | PRN

## 2024-08-26 MED ORDER — AMIODARONE HCL 200 MG PO TABS
200.0000 mg | ORAL_TABLET | Freq: Every day | ORAL | Status: DC
Start: 2024-09-10 — End: 2024-08-30
  Administered 2024-08-27 (×2): 200 mg via ORAL

## 2024-08-26 MED ORDER — HEPARIN SODIUM (PORCINE) 1000 UNIT/ML IJ SOLN
INTRAMUSCULAR | Status: DC | PRN
  Administered 2024-08-26: 3000 [IU] via INTRAVENOUS

## 2024-08-26 MED ORDER — SODIUM CHLORIDE 0.9% FLUSH
3.0000 mL | Freq: Two times a day (BID) | INTRAVENOUS | Status: DC
  Administered 2024-08-26 – 2024-08-30 (×8): 3 mL via INTRAVENOUS

## 2024-08-26 MED ORDER — FREE WATER: 500.0000 mL | Freq: Once | Status: DC

## 2024-08-26 MED ORDER — RIVAROXABAN 20 MG PO TABS
20.0000 mg | ORAL_TABLET | Freq: Every day | ORAL | Status: DC
  Administered 2024-08-26 – 2024-08-31 (×6): 20 mg via ORAL
  Filled 2024-08-26 (×6): qty 1

## 2024-08-26 MED ORDER — HEPARIN SODIUM (PORCINE) 1000 UNIT/ML IJ SOLN
INTRAMUSCULAR | Status: AC
  Filled 2024-08-26: qty 10

## 2024-08-26 MED ORDER — IOHEXOL 300 MG/ML  SOLN
INTRAMUSCULAR | Status: DC | PRN
  Administered 2024-08-26: 90 mL

## 2024-08-26 MED ORDER — HEPARIN (PORCINE) IN NACL 1000-0.9 UT/500ML-% IV SOLN
INTRAVENOUS | Status: AC
  Filled 2024-08-26: qty 1000

## 2024-08-26 MED ORDER — LIDOCAINE HCL (PF) 1 % IJ SOLN
INTRAMUSCULAR | Status: DC | PRN
  Administered 2024-08-26: 2 mL

## 2024-08-26 MED ORDER — LIDOCAINE HCL 1 % IJ SOLN
INTRAMUSCULAR | Status: AC
  Filled 2024-08-26: qty 20

## 2024-08-26 MED ORDER — SODIUM CHLORIDE 0.9 % IV SOLN: 250.0000 mL | INTRAVENOUS | Status: AC | PRN

## 2024-08-26 MED ORDER — HEPARIN (PORCINE) IN NACL 1000-0.9 UT/500ML-% IV SOLN
INTRAVENOUS | Status: DC | PRN
  Administered 2024-08-26: 1000 mL

## 2024-08-26 MED ORDER — FENTANYL CITRATE (PF) 100 MCG/2ML IJ SOLN
INTRAMUSCULAR | Status: DC | PRN
  Administered 2024-08-26: 25 ug via INTRAVENOUS

## 2024-08-26 MED ORDER — VERAPAMIL HCL 2.5 MG/ML IV SOLN
INTRAVENOUS | Status: AC
  Filled 2024-08-26: qty 2

## 2024-08-26 MED ORDER — AMIODARONE HCL 200 MG PO TABS
200.0000 mg | ORAL_TABLET | Freq: Two times a day (BID) | ORAL | Status: DC
  Administered 2024-08-26 – 2024-08-30 (×7): 200 mg via ORAL
  Filled 2024-08-26 (×8): qty 1

## 2024-08-26 MED ORDER — MIDAZOLAM HCL 2 MG/2ML IJ SOLN
INTRAMUSCULAR | Status: AC
  Filled 2024-08-26: qty 2

## 2024-08-26 MED ORDER — ORAL CARE MOUTH RINSE: 15.0000 mL | OROMUCOSAL | Status: DC | PRN

## 2024-08-26 MED ORDER — FENTANYL CITRATE (PF) 100 MCG/2ML IJ SOLN
INTRAMUSCULAR | Status: AC
  Filled 2024-08-26: qty 2

## 2024-08-26 MED ORDER — VERAPAMIL HCL 2.5 MG/ML IV SOLN
INTRAVENOUS | Status: DC | PRN
  Administered 2024-08-26: 2.5 mg via INTRA_ARTERIAL

## 2024-08-26 SURGICAL SUPPLY — 11 items
CATH 5FR JL3.5 JR4 ANG PIG MP (CATHETERS) IMPLANT
CATH INFINITI JR4 5F (CATHETERS) IMPLANT
DEVICE RAD TR BAND REGULAR (VASCULAR PRODUCTS) IMPLANT
DRAPE BRACHIAL (DRAPES) IMPLANT
GLIDESHEATH SLEND SS 6F .021 (SHEATH) IMPLANT
GUIDEWIRE INQWIRE 1.5J.035X260 (WIRE) IMPLANT
PACK CARDIAC CATH (CUSTOM PROCEDURE TRAY) ×2 IMPLANT
PAD ELECT DEFIB RADIOL ZOLL (MISCELLANEOUS) IMPLANT
SET ATX-X65L (MISCELLANEOUS) IMPLANT
STATION PROTECTION PRESSURIZED (MISCELLANEOUS) IMPLANT
WIRE HITORQ VERSACORE ST 145CM (WIRE) IMPLANT

## 2024-08-26 NOTE — Telephone Encounter (Signed)
 Patient Product/process development scientist completed.    The patient is insured through [employer]/ [employer] Advance. Patient has Medicare and is not eligible for a copay card, but may be able to apply for patient assistance or Medicare RX Payment Plan (Patient Must reach out to their plan, if eligible for payment plan), if available.    Ran test claim for Entresto 24-26 mg and the current 30 day co-pay is $16.76.  Ran test claim for Farxiga 10 mg and the current 30 day co-pay is $16.76.  Ran test claim for Jardiance 10 mg and the current 30 day co-pay is $16.76.  This test claim was processed through [facility]- copay amounts may vary at other pharmacies due to pharmacy/plan contracts, or as the patient moves through the different stages of their insurance plan.     Reyes Sharps, CPHT Pharmacy Technician III Certified Patient Advocate [facility] Patient Advocate Team Direct Number: [phone]  Fax: [phone]

## 2024-08-26 NOTE — TOC CM/SW Note (Signed)
 Transition of Care [facility]) - Inpatient Brief Assessment   Patient Details  Name: Susan Myers MRN: [id number] Date of Birth: [birth year]  Transition of Care [facility]) CM/SW Contact:    Lauraine JAYSON Carpen, LCSW Phone Number: 08/26/2024, 3:10 PM   Clinical Narrative: CSW reviewed chart. No TOC needs identified so far. CSW will continue to follow progress. [facility] consult if any needs arise.  Transition of Care Asessment: Insurance and Status: Insurance coverage has been reviewed Patient has primary care physician: Yes Home environment has been reviewed: Single family home Prior level of function:: Not documented Prior/Current Home Services: No current home services Social Drivers of Health Review: SDOH reviewed no interventions necessary Readmission risk has been reviewed: Yes Transition of care needs: no transition of care needs at this time

## 2024-08-26 NOTE — Progress Notes (Signed)
 Patient stable. Bedside handoff given to [facility].

## 2024-08-26 NOTE — Progress Notes (Signed)
 Heart Failure Stewardship Pharmacy Note  PCP: Auston Reyes BIRCH, MD PCP-Cardiologist: None  HPI: Susan Myers is a 73 y.o. female with PAF on Xarelto , HTN, breast cancer status post lumpectomy radiation, on chronic hormonal manipulation, hypothyroidism who presented with dyspnea on exertion, orthopnea, and lower extremity edema. Started on diltiazem  recently outpatient for atrial fibrillation, after which symptoms worsened. On admission, BNP was 200, HS-troponin was 5, and TSH 4.732. ECG showed AF with RVR at 158 bpm. Chest x-ray noted central pulmonary vascular congestion and trace bilateral pleural effusions. Previously normal LVEF and no ischemia on stress test 01/2023. TTE this admission noted LVEF of 35-40%, G3DD, severe MR.  Pertinent Lab Values: Creatinine, Ser  Date Value Ref Range Status  08/26/2024 1.07 (H) 0.44 - 1.00 mg/dL Final   BUN  Date Value Ref Range Status  08/26/2024 21 8 - 23 mg/dL Final   Potassium  Date Value Ref Range Status  08/26/2024 3.6 3.5 - 5.1 mmol/L Final   Sodium  Date Value Ref Range Status  08/26/2024 142 135 - 145 mmol/L Final   B Natriuretic Peptide  Date Value Ref Range Status  08/24/2024 200.0 (H) 0.0 - 100.0 pg/mL Final    Comment:    Performed at [facility], [address] [place], [place] [address]   Magnesium   Date Value Ref Range Status  08/26/2024 2.3 1.7 - 2.4 mg/dL Final    Comment:    Performed at [facility], [address] [place], [place] [address]   Hgb A1c MFr Bld  Date Value Ref Range Status  11/23/2015 6.0 4.0 - 6.0 % Final   TSH  Date Value Ref Range Status  08/24/2024 2.947 0.350 - 4.500 uIU/mL Final    Comment:    Performed by a 3rd Generation assay with a functional sensitivity of <=0.01 uIU/mL. Performed at [facility], [address] [place], [place] [address]     Vital Signs:  Temp:  [97.6 F (36.4 C)-98.2 F (36.8 C)] 98.2 F (36.8 C) (10/03 0736) Pulse  Rate:  [59-105] 105 (10/03 0736) Cardiac Rhythm: Normal sinus rhythm;Bundle branch block (10/02 1900) Resp:  [16-28] 21 (10/03 0736) BP: (94-127)/(73-87) 94/79 (10/03 0736) SpO2:  [92 %-95 %] 94 % (10/03 0736)  Intake/Output Summary (Last 24 hours) at 08/26/2024 0759 Last data filed at 08/26/2024 0500 Gross per 24 hour  Intake 605.48 ml  Output --  Net 605.48 ml    Current Heart Failure Medications:  Loop diuretic: furosemide 40 mg IV daily Beta-Blocker: metoprolol succinate 25 mg BID ACEI/ARB/ARNI: none MRA: none SGLT2i: Jardiance 10 mg daily Other: none  Prior to admission Heart Failure Medications:  Loop diuretic: none Beta-Blocker: none ACEI/ARB/ARNI: none MRA: none SGLT2i: none Other: diltiazem  240 mg daily, hydrochlorothiazide  25 mg daily  Assessment: 1. Acute combined systolic and diastolic heart failure (LVEF 35-40%) with G3DD, due to unknown etiology. Complicated by AF RVR. NYHA class III symptoms.  -Symptoms: Reports shortness of breath is somewhat improved though remains present. LEE noted. Reports orthopnea as well. -Volume: Appears to still be hypervolemic this morning. Furosemide 40 mg IV given this morning. Can consider adjustment based on LHC filling pressures. -Hemodynamics: BP softer this AM. Rate is down to low 100s on amiodarone and metoprolol.  -BB: Continue metoprolol succinate 25 mg BID -ACEI/ARB/ARNI: BP currently too soft for initiation, can consider adding if improved over the weekend. -MRA: Consider adding if BP is stable ~110 mmHg over the weekend. -SGLT2i: Continue Jardiance 10 mg  daily.  Plan: 1) Medication changes recommended at this time: -None at this time  2) Patient assistance: -Pending  3) Education: - Patient has been educated on current HF medications and potential additions to HF medication regimen - Patient verbalizes understanding that over the next few months, these medication doses may change and more medications may be added  to optimize HF regimen - Patient has been educated on basic disease state pathophysiology and goals of therapy  Medication Assistance / Insurance Benefits Check: Does the patient have prescription insurance?    Please do not hesitate to reach out with questions or concerns,  Jaun Bash, PharmD, CPP, BCPS, [facility] Heart Failure Pharmacist  Phone - [phone] 08/26/2024 10:50 AM

## 2024-08-26 NOTE — Progress Notes (Signed)
  Progress Note   Patient: Susan Myers FMW:[id number] DOB: 05/24/1951 DOA: 08/24/2024     2 DOS: the patient was seen and examined on 08/26/2024   Brief hospital course: Susan Myers is a 73 y.o. female with medical history significant of PAF on Xarelto , HTN, breast cancer status post lumpectomy radiation, on chronic hormonal manipulation, hypothyroidism, presented with worsening of cough shortness of breath and peripheral edema.  Upon arriving to hospital, she was found to have atrial fibrillation with RVR.  Amiodarone was started.  Patient was also given beta-blocker. Patient is seen by cardiology, echocardiogram showed ejection fraction of 35%.  Started on IV Lasix for acute exacerbation of congestive heart failure.  Heart cath scheduled for 10/3.    Active Problems:   A-fib (HCC)   Acute on chronic combined systolic and diastolic CHF (congestive heart failure) (HCC)   Hypokalemia   Paroxysmal atrial fibrillation with RVR (HCC)   Assessment and Plan: Paroxysmal atrial fibrillation with RVR. Patient has been followed by cardiology, currently on amiodarone and heparin  drip.  Added beta-blocker. Patient had an increased heart rate last night, but converted to sinus again this morning.    Acute on chronic combined systolic and diastolic congestive heart failure. Severe mitral regurgitation. Severe tricuspid regurgitation. Prior echocardiogram showed normal ejection fraction, currently ejection fraction was 35% to 40% with grade 3 diastolic dysfunction, severe mitral regurgitation and severe tricuspid regurgitation. Patient currently treated with IV Lasix.  Beta-blocker. Going for heart cath today.   Hypokalemia. Potassium normalized   Essential hypertension. Continue beta-blocker.   Hypothyroidism. Continue Synthroid .  TSH normal.   History of breast cancer. On letrozole .       Subjective:  Patient doing much better today with breathing.  No  cough.  Physical Exam: Vitals:   08/25/24 2358 08/26/24 0523 08/26/24 0736 08/26/24 1204  BP: 113/73 104/73 94/79 (!) 118/90  Pulse: (!) 59 (!) 105 (!) 105 99  Resp: 18 18 (!) 21 (!) 22  Temp: 98.2 F (36.8 C) 98 F (36.7 C) 98.2 F (36.8 C) (!) 97.5 F (36.4 C)  TempSrc:   Oral Oral  SpO2: 95% 92% 94% 94%   General exam: Appears calm and comfortable  Respiratory system: Clear to auscultation. Respiratory effort normal. Cardiovascular system: S1 & S2 heard, RRR. No JVD, murmurs, rubs, gallops or clicks. No pedal edema. Gastrointestinal system: Abdomen is nondistended, soft and nontender. No organomegaly or masses felt. Normal bowel sounds heard. Central nervous system: Alert and oriented. No focal neurological deficits. Extremities: Symmetric 5 x 5 power. Skin: No rashes, lesions or ulcers Psychiatry: Judgement and insight appear normal. Mood & affect appropriate.    Data Reviewed:  Lab results reviewed.  Family Communication: Husband updated at bedside.  Disposition: Status is: Inpatient Remains inpatient appropriate because: Severity of disease, IV treatment, inpatient procedure.     Time spent: 35 minutes  Author: Murvin Mana, MD 08/26/2024 12:22 PM  For on call review [email or web address].

## 2024-08-27 DIAGNOSIS — I5043 Acute on chronic combined systolic (congestive) and diastolic (congestive) heart failure: Secondary | ICD-10-CM | POA: Diagnosis not present

## 2024-08-27 DIAGNOSIS — I480 Paroxysmal atrial fibrillation: Secondary | ICD-10-CM | POA: Diagnosis not present

## 2024-08-27 DIAGNOSIS — E876 Hypokalemia: Secondary | ICD-10-CM | POA: Diagnosis not present

## 2024-08-27 MED ORDER — METOPROLOL SUCCINATE ER 25 MG PO TB24
25.0000 mg | ORAL_TABLET | Freq: Once | ORAL | Status: AC
  Administered 2024-08-27: 25 mg via ORAL
  Filled 2024-08-27: qty 1

## 2024-08-27 MED ORDER — DIGOXIN 250 MCG PO TABS
0.2500 mg | ORAL_TABLET | Freq: Once | ORAL | Status: DC
Start: 2024-08-27 — End: 2024-08-27
  Filled 2024-08-27: qty 1

## 2024-08-27 MED ORDER — METOPROLOL SUCCINATE ER 50 MG PO TB24
50.0000 mg | ORAL_TABLET | Freq: Two times a day (BID) | ORAL | Status: DC
  Administered 2024-08-27 – 2024-08-29 (×4): 50 mg via ORAL
  Filled 2024-08-27 (×4): qty 1

## 2024-08-27 MED ORDER — DIGOXIN 0.25 MG/ML IJ SOLN
0.1250 mg | Freq: Every day | INTRAMUSCULAR | Status: DC
  Administered 2024-08-28 – 2024-08-29 (×2): 0.125 mg via INTRAVENOUS
  Filled 2024-08-27 (×2): qty 2

## 2024-08-27 MED ORDER — DIGOXIN 0.25 MG/ML IJ SOLN
0.2500 mg | Freq: Once | INTRAMUSCULAR | Status: AC
  Administered 2024-08-27: 0.25 mg via INTRAVENOUS
  Filled 2024-08-27: qty 2

## 2024-08-27 MED ORDER — DIGOXIN 0.25 MG/ML IJ SOLN
0.5000 mg | Freq: Once | INTRAMUSCULAR | Status: AC
  Administered 2024-08-27: 0.5 mg via INTRAVENOUS
  Filled 2024-08-27: qty 2

## 2024-08-27 NOTE — Plan of Care (Signed)
  Problem: Education: Goal: Knowledge of General Education information will improve Description: Including pain rating scale, medication(s)/side effects and non-pharmacologic comfort measures Outcome: Progressing   Problem: Clinical Measurements: Goal: Will remain free from infection Outcome: Progressing Goal: Cardiovascular complication will be avoided Outcome: Progressing   Problem: Activity: Goal: Risk for activity intolerance will decrease Outcome: Progressing   Problem: Nutrition: Goal: Adequate nutrition will be maintained Outcome: Progressing   Problem: Elimination: Goal: Will not experience complications related to urinary retention Outcome: Progressing   Problem: Safety: Goal: Ability to remain free from injury will improve Outcome: Progressing   Problem: Skin Integrity: Goal: Risk for impaired skin integrity will decrease Outcome: Progressing   Problem: Cardiovascular: Goal: Ability to achieve and maintain adequate cardiovascular perfusion will improve Outcome: Progressing Goal: Vascular access site(s) Level 0-1 will be maintained Outcome: Progressing

## 2024-08-27 NOTE — Progress Notes (Signed)
 Patient ID: Susan Myers, female   DOB: Apr 12, 1951, 73 y.o.   MRN: [id number] [facility]    SUBJECTIVE: Patient states she feels much better does not notice palpitations or tachycardia not really short of breath is ambulated in the halls and room right cath radial access site appears to have healed well no issues hopefully can be discharged home soon   Vitals:   08/26/24 2200 08/26/24 2345 08/27/24 0451 08/27/24 0757  BP:  99/76 112/78 112/72  Pulse:  99 88 82  Resp: 18 18 18 18   Temp:  (!) 97.5 F (36.4 C) 98 F (36.7 C) 98.3 F (36.8 C)  TempSrc:    Oral  SpO2:  94% 96% 94%     Intake/Output Summary (Last 24 hours) at 08/27/2024 1114 Last data filed at 08/27/2024 1017 Gross per 24 hour  Intake 384.15 ml  Output 700 ml  Net -315.85 ml      PHYSICAL EXAM  General: Well developed, well nourished, in no acute distress HEENT:  Normocephalic and atramatic Neck:  No JVD.  Lungs: Clear bilaterally to auscultation and percussion. Heart: Irregular irregular. Normal S1 and S2 without gallops or murmurs.  Abdomen: Bowel sounds are positive, abdomen soft and non-tender  Msk:  Back normal, normal gait. Normal strength and tone for age. Extremities: No clubbing, cyanosis or edema.   Neuro: Alert and oriented X 3. Psych:  Good affect, responds appropriately   LABS: Basic Metabolic Panel: Recent Labs    08/25/24 0418 08/26/24 0426  NA 137 142  K 3.4* 3.6  CL 101 104  CO2 26 26  GLUCOSE 124* 115*  BUN 18 21  CREATININE 0.99 1.07*  CALCIUM 8.8* 9.1  MG  --  2.3   Liver Function Tests: No results for input(s): AST, ALT, ALKPHOS, BILITOT, PROT, ALBUMIN in the last 72 hours. No results for input(s): LIPASE, AMYLASE in the last 72 hours. CBC: Recent Labs    08/26/24 0426  WBC 9.3  HGB 13.0  HCT 39.4  MCV 94.7  PLT 188   Cardiac Enzymes: No results for input(s): CKTOTAL, CKMB, CKMBINDEX, TROPONINI in the last 72 hours. BNP: Invalid  input(s): POCBNP D-Dimer: No results for input(s): DDIMER in the last 72 hours. Hemoglobin A1C: No results for input(s): HGBA1C in the last 72 hours. Fasting Lipid Panel: No results for input(s): CHOL, HDL, LDLCALC, TRIG, CHOLHDL, LDLDIRECT in the last 72 hours. Thyroid  Function Tests: No results for input(s): TSH, T4TOTAL, T3FREE, THYROIDAB in the last 72 hours.  Invalid input(s): FREET3 Anemia Panel: No results for input(s): VITAMINB12, FOLATE, FERRITIN, TIBC, IRON, RETICCTPCT in the last 72 hours.  No results found.   Echo moderate depressed left ventricular function EF around 35% globally  TELEMETRY: Atrial fibrillation and intermittent bouts of accessory pathway wide-complex QRS delta wave heart rate around 120  ASSESSMENT AND PLAN:  Active Problems:   A-fib (HCC)   Acute on chronic combined systolic and diastolic CHF (congestive heart failure) (HCC)   Hypokalemia   Paroxysmal atrial fibrillation with RVR (HCC) Accessory pathway on EKG Mild obesity Status post cardiac cath no significant obstructive disease Hyperlipidemia GERD  Plan A-fib RVR on anticoagulation Xarelto  amiodarone beta-blocker still with tachycardia will advance metoprolol add digoxin load Goal is to have heart rate under 100 consider cardioversion Cardiomyopathy on echocardiogram on cath LV function appeared to be preserved Will consider cardiac MRI as an outpatient for further assessment Refer patient to EP this upcoming week for evaluation of possible cardioversion versus  EP study with ablation Optimize electrolytes potassium above 4 magnesium  above 2 Agree with GDMT for systolic cardiomyopathy on echo metoprolol ACE ARB Arni SGLT2 consider spironolactone Will try to avoid diltiazem  until LV function assessment fully established that she is not systolic cardiomyopathy Continue amiodarone load orally until seen and evaluated by EP  Susan JONETTA Lovelace,  MD 08/27/2024 11:14 AM

## 2024-08-27 NOTE — Care Management Important Message (Signed)
 Important Message  Patient Details  Name: Susan Myers MRN: [id number] Date of Birth: 02/27/1951   Important Message Given:  Yes - Medicare IM     Rojelio [name] 08/27/2024, 1:43 PM

## 2024-08-27 NOTE — Progress Notes (Signed)
   08/27/24 1544  Assess: MEWS Score  Temp 97.8 F (36.6 C)  BP 98/61  MAP (mmHg) 72  Pulse Rate (!) 103  ECG Heart Rate (!) 106  Resp 20  Level of Consciousness Alert  SpO2 95 %  O2 Device Room Air  Assess: MEWS Score  MEWS Temp 0  MEWS Systolic 1  MEWS Pulse 1  MEWS RR 0  MEWS LOC 0  MEWS Score 2  MEWS Score Color Yellow  Assess: if the MEWS score is Yellow or Red  Were vital signs accurate and taken at a resting state? Yes  Does the patient meet 2 or more of the SIRS criteria? No  MEWS guidelines implemented  Yes, yellow  Treat  MEWS Interventions Considered administering scheduled or prn medications/treatments as ordered  Take Vital Signs  Increase Vital Sign Frequency  Yellow: Q2hr x1, continue Q4hrs until patient remains green for 12hrs  Escalate  MEWS: Escalate Yellow: Discuss with charge nurse and consider notifying provider and/or RRT  Notify: Charge Nurse/RN  Name of Charge Nurse/RN Notified Rankin, RN  Provider Notification  Provider Name/Title Dr. Murvin Mana  Date Provider Notified 08/27/24  Time Provider Notified 1622  Method of Notification  (secure chat)  Notification Reason Other (Comment) (Yellow MEWs)  Provider response No new orders  Date of Provider Response 08/27/24  Time of Provider Response 1625  Assess: SIRS CRITERIA  SIRS Temperature  0  SIRS Respirations  0  SIRS Pulse 1  SIRS WBC 0  SIRS Score Sum  1   Dr. Mana acknowledged and stated working on it.

## 2024-08-27 NOTE — Progress Notes (Signed)
 Patient ID: Susan Myers, female   DOB: 12/15/1950, 73 y.o.   MRN: [id number]  Patient has had persistent atrial fibrillation and what appears to be accessory pathway rapid ventricular response amiodarone digoxin metoprolol Patient was on heparin  transition to Xarelto  Will consider cardioversion tomorrow if rates not controlled or converted Exam benign Heart rate tachycardic irregular  Recommend cardioversion tomorrow prior to discharge

## 2024-08-27 NOTE — Progress Notes (Signed)
  Progress Note   Patient: Susan Myers FMW:[id number] DOB: 07/17/1951 DOA: 08/24/2024     3 DOS: the patient was seen and examined on 08/27/2024   Brief hospital course: Susan Myers is a 73 y.o. female with medical history significant of PAF on Xarelto , HTN, breast cancer status post lumpectomy radiation, on chronic hormonal manipulation, hypothyroidism, presented with worsening of cough shortness of breath and peripheral edema.  Upon arriving to hospital, she was found to have atrial fibrillation with RVR.  Amiodarone was started.  Patient was also given beta-blocker. Patient is seen by cardiology, echocardiogram showed ejection fraction of 35%.  Started on IV Lasix for acute exacerbation of congestive heart failure.  Heart cath scheduled for 10/3.    Active Problems:   A-fib (HCC)   Acute on chronic combined systolic and diastolic CHF (congestive heart failure) (HCC)   Hypokalemia   Paroxysmal atrial fibrillation with RVR (HCC)   Assessment and Plan:  Paroxysmal atrial fibrillation with RVR. Patient has been followed by cardiology, currently on amiodarone and heparin  drip.  Added beta-blocker. She is having  back-and-forth with sinus and atrial fibrillation.  Currently atrial fibrillation with heart rate 120s to 140s.  Increase dose of metoprolol to 50 mg twice a day.  Also keep a dose of digoxin at 0.2 mg.  May continue 0.1 mg daily.     Acute on chronic combined systolic and diastolic congestive heart failure. Severe mitral regurgitation. Severe tricuspid regurgitation. Prior echocardiogram showed normal ejection fraction, currently ejection fraction was 35% to 40% with grade 3 diastolic dysfunction, severe mitral regurgitation and severe tricuspid regurgitation. Patient currently treated with IV Lasix.  Beta-blocker. Volume status much better.  Coronary angiogram showed nonobstructive coronary disease.  Continue to follow.   Hypokalemia. Potassium normalized    Essential hypertension. Continue beta-blocker.   Hypothyroidism. Continue Synthroid .  TSH normal.   History of breast cancer. On letrozole .      Subjective:  Patient doing well, denies any short of breath, chest pain or palpitation.  Physical Exam: Vitals:   08/26/24 2200 08/26/24 2345 08/27/24 0451 08/27/24 0757  BP:  99/76 112/78 112/72  Pulse:  99 88 82  Resp: 18 18 18 18   Temp:  (!) 97.5 F (36.4 C) 98 F (36.7 C) 98.3 F (36.8 C)  TempSrc:    Oral  SpO2:  94% 96% 94%   General exam: Appears calm and comfortable  Respiratory system: Clear to auscultation. Respiratory effort normal. Cardiovascular system: Irregularly irregular with tachycardia. No JVD, murmurs, rubs, gallops or clicks. No pedal edema. Gastrointestinal system: Abdomen is nondistended, soft and nontender. No organomegaly or masses felt. Normal bowel sounds heard. Central nervous system: Alert and oriented. No focal neurological deficits. Extremities: Symmetric 5 x 5 power. Skin: No rashes, lesions or ulcers Psychiatry: Judgement and insight appear normal. Mood & affect appropriate.    Data Reviewed:  There are no new results to review at this time.  Family Communication: Husband updated at bedside.  Disposition: Status is: Inpatient Remains inpatient appropriate because: Severity of disease, IV treatment.     Time spent: 50 minutes  Author: Murvin Mana, MD 08/27/2024 11:19 AM  For on call review [email or web address].

## 2024-08-28 ENCOUNTER — Encounter
Admission: EM | Disposition: A | Discharge status: Home / Self Care | Payer: Self-pay | Source: Home / Self Care | Attending: Internal Medicine

## 2024-08-28 DIAGNOSIS — I480 Paroxysmal atrial fibrillation: Secondary | ICD-10-CM | POA: Diagnosis not present

## 2024-08-28 DIAGNOSIS — I5043 Acute on chronic combined systolic (congestive) and diastolic (congestive) heart failure: Secondary | ICD-10-CM | POA: Diagnosis not present

## 2024-08-28 DIAGNOSIS — E876 Hypokalemia: Secondary | ICD-10-CM | POA: Diagnosis not present

## 2024-08-28 LAB — BASIC METABOLIC PANEL WITH GFR
Anion gap: 16 — ABNORMAL HIGH (ref 5–15)
BUN: 35 mg/dL — ABNORMAL HIGH (ref 8–23)
CO2: 24 mmol/L (ref 22–32)
Calcium: 9.1 mg/dL (ref 8.9–10.3)
Chloride: 105 mmol/L (ref 98–111)
Creatinine, Ser: 1.04 mg/dL — ABNORMAL HIGH (ref 0.44–1.00)
GFR, Estimated: 57 mL/min — ABNORMAL LOW (ref >60)
Glucose, Bld: 97 mg/dL (ref 70–99)
Potassium: 4 mmol/L (ref 3.5–5.1)
Sodium: 145 mmol/L (ref 135–145)

## 2024-08-28 LAB — MAGNESIUM: Magnesium: 2.5 mg/dL — ABNORMAL HIGH (ref 1.7–2.4)

## 2024-08-28 LAB — CARDIAC CATHETERIZATION: Cath EF Quantitative: 55 %

## 2024-08-28 SURGERY — CARDIOVERSION
Anesthesia: Monitor Anesthesia Care

## 2024-08-28 MED ORDER — SODIUM CHLORIDE 0.9 % IV SOLN: INTRAVENOUS | Status: DC

## 2024-08-28 MED ORDER — FUROSEMIDE 40 MG PO TABS
40.0000 mg | ORAL_TABLET | Freq: Every day | ORAL | Status: DC
  Administered 2024-08-28 – 2024-08-31 (×4): 40 mg via ORAL
  Filled 2024-08-28 (×4): qty 1

## 2024-08-28 NOTE — Plan of Care (Signed)
  Problem: Education: Goal: Knowledge of General Education information will improve Description Including pain rating scale, medication(s)/side effects and non-pharmacologic comfort measures Outcome: Progressing   Problem: Clinical Measurements: Goal: Ability to maintain clinical measurements within normal limits will improve Outcome: Progressing Goal: Will remain free from infection Outcome: Progressing Goal: Diagnostic test results will improve Outcome: Progressing Goal: Respiratory complications will improve Outcome: Progressing Goal: Cardiovascular complication will be avoided Outcome: Progressing   Problem: Activity: Goal: Risk for activity intolerance will decrease Outcome: Progressing   Problem: Nutrition: Goal: Adequate nutrition will be maintained Outcome: Progressing   Problem: Coping: Goal: Level of anxiety will decrease Outcome: Progressing   Problem: Elimination: Goal: Will not experience complications related to bowel motility Outcome: Progressing Goal: Will not experience complications related to urinary retention Outcome: Progressing   Problem: Safety: Goal: Ability to remain free from injury will improve Outcome: Progressing   

## 2024-08-28 NOTE — Plan of Care (Signed)

## 2024-08-28 NOTE — Progress Notes (Signed)
  Progress Note   Patient: Susan Myers FMW:[id number] DOB: November 12, 1951 DOA: 08/24/2024     4 DOS: the patient was seen and examined on 08/28/2024   Brief hospital course: Susan Myers is a 73 y.o. female with medical history significant of PAF on Xarelto , HTN, breast cancer status post lumpectomy radiation, on chronic hormonal manipulation, hypothyroidism, presented with worsening of cough shortness of breath and peripheral edema.  Upon arriving to hospital, she was found to have atrial fibrillation with RVR.  Amiodarone was started.  Patient was also given beta-blocker. Patient is seen by cardiology, echocardiogram showed ejection fraction of 35%.  Started on IV Lasix for acute exacerbation of congestive heart failure.  Heart cath scheduled for 10/3.    Active Problems:   A-fib (HCC)   Acute on chronic combined systolic and diastolic CHF (congestive heart failure) (HCC)   Hypokalemia   Paroxysmal atrial fibrillation with RVR (HCC)   Assessment and Plan: Paroxysmal atrial fibrillation with RVR. Patient has been followed by cardiology, was on amiodarone and heparin  drip.  Added beta-blocker.  Anticoagulation changed to Xarelto  She is having  back-and-forth with sinus and atrial fibrillation.  Currently atrial fibrillation with heart rate 120s to 140s.  Increase dose of metoprolol to 50 mg twice a day.  Patient was also given digoxin. Heart rate still elevated today, cardiology has scheduled for cardioversion.     Acute on chronic combined systolic and diastolic congestive heart failure. Severe mitral regurgitation. Severe tricuspid regurgitation. Prior echocardiogram showed normal ejection fraction, currently ejection fraction was 35% to 40% with grade 3 diastolic dysfunction, severe mitral regurgitation and severe tricuspid regurgitation. Patient is treated with IV Lasix.  Beta-blocker. Volume status much better.  Coronary angiogram showed nonobstructive coronary disease.   Furosemide changed to oral   Hypokalemia. Chronic kidney disease stage II. Potassium normalized.  Patient has slightly worsening renal function, does not have stage III CKD, does not meet criteria for AKI   Essential hypertension. Continue beta-blocker.   Hypothyroidism. Continue Synthroid .  TSH normal.   History of breast cancer. On letrozole .        Subjective:  Patient feels better today.  No  short of breath  Physical Exam: Vitals:   08/28/24 0421 08/28/24 0544 08/28/24 0905 08/28/24 0935  BP: 106/74  96/67 92/60  Pulse: 73 96 78 94  Resp: 18 18    Temp: 98.1 F (36.7 C)  98 F (36.7 C)   TempSrc: Oral     SpO2: 96%  95%    General exam: Appears calm and comfortable  Respiratory system: Clear to auscultation. Respiratory effort normal. Cardiovascular system: Irregularly irregular and tachycardic. No JVD, murmurs, rubs, gallops or clicks. No pedal edema. Gastrointestinal system: Abdomen is nondistended, soft and nontender. No organomegaly or masses felt. Normal bowel sounds heard. Central nervous system: Alert and oriented. No focal neurological deficits. Extremities: Symmetric 5 x 5 power. Skin: No rashes, lesions or ulcers Psychiatry: Judgement and insight appear normal. Mood & affect appropriate.    Data Reviewed:  Lab results reviewed.  Family Communication: Husband updated at the bedside.  Disposition: Status is: Inpatient Remains inpatient appropriate because: Severity of disease, inpatient procedure, IV treatment.     Time spent: 35 minutes  Author: Murvin Mana, MD 08/28/2024 11:55 AM  For on call review [email or web address].

## 2024-08-28 NOTE — Progress Notes (Signed)
 Patient ID: Susan Myers, female   DOB: December 11, 1950, 73 y.o.   MRN: [id number] [facility] Cardiology    SUBJECTIVE: Patient appears to be resting comfortably denies any significant palpitation tachycardia ambulating in the hall without difficulty no shortness of breath no chest pain   Vitals:   08/28/24 0544 08/28/24 0905 08/28/24 0935 08/28/24 1244  BP:  96/67 92/60 (!) 111/96  Pulse: 96 78 94 92  Resp: 18     Temp:  98 F (36.7 C)  97.6 F (36.4 C)  TempSrc:      SpO2:  95%  95%     Intake/Output Summary (Last 24 hours) at 08/28/2024 1512 Last data filed at 08/28/2024 1426 Gross per 24 hour  Intake 470 ml  Output 1150 ml  Net -680 ml      PHYSICAL EXAM  General: Well developed, well nourished, in no acute distress HEENT:  Normocephalic and atramatic Neck:  No JVD.  Lungs: Clear bilaterally to auscultation and percussion. Heart: Irregular irregular. Normal S1 and S2 without gallops or 3/6 sem murmurs.  Abdomen: Bowel sounds are positive, abdomen soft and non-tender  Msk:  Back normal, normal gait. Normal strength and tone for age. Extremities: No clubbing, cyanosis or edema.   Neuro: Alert and oriented X 3. Psych:  Good affect, responds appropriately   LABS: Basic Metabolic Panel: Recent Labs    08/26/24 0426 08/28/24 0426  NA 142 145  K 3.6 4.0  CL 104 105  CO2 26 24  GLUCOSE 115* 97  BUN 21 35*  CREATININE 1.07* 1.04*  CALCIUM 9.1 9.1  MG 2.3 2.5*   Liver Function Tests: No results for input(s): AST, ALT, ALKPHOS, BILITOT, PROT, ALBUMIN in the last 72 hours. No results for input(s): LIPASE, AMYLASE in the last 72 hours. CBC: Recent Labs    08/26/24 0426  WBC 9.3  HGB 13.0  HCT 39.4  MCV 94.7  PLT 188   Cardiac Enzymes: No results for input(s): CKTOTAL, CKMB, CKMBINDEX, TROPONINI in the last 72 hours. BNP: Invalid input(s): POCBNP D-Dimer: No results for input(s): DDIMER in the last 72 hours. Hemoglobin A1C: No  results for input(s): HGBA1C in the last 72 hours. Fasting Lipid Panel: No results for input(s): CHOL, HDL, LDLCALC, TRIG, CHOLHDL, LDLDIRECT in the last 72 hours. Thyroid  Function Tests: No results for input(s): TSH, T4TOTAL, T3FREE, THYROIDAB in the last 72 hours.  Invalid input(s): FREET3 Anemia Panel: No results for input(s): VITAMINB12, FOLATE, FERRITIN, TIBC, IRON, RETICCTPCT in the last 72 hours.  No results found.   Echo Mild/moderate  depressed left ventricular function EF around 35-40% severe MR severe TR  TELEMETRY: Rapid atrial fibrillation with intermittent accessory pathway wide-complex QRS rate of around 110 120:  ASSESSMENT AND PLAN:  Active Problems:   A-fib (HCC)   Acute on chronic combined systolic and diastolic CHF (congestive heart failure) (HCC)   Hypokalemia   Paroxysmal atrial fibrillation with RVR (HCC) Accessory pathway possibly WPW Tachycardia Acute on chronic systolic diastolic congestive heart failure History of severe MR/TR    Plan Continue telemetry agree with amiodarone load digoxin metoprolol anticoagulation with Xarelto  Consider cardioversion prior to discharge Refer patient to EP for further evaluation including possible ablation Patient may need structural or CT surgery evaluation for severe regurgitation of mitral and close transthoracic valves Recommend heart failure evaluation for depressed left ventricular function and valvular dysfunction may need GDMT for cardiomyopathy Correct electrolytes including hypokalemia Follow-up renal function consider nephrology input Hypertension management to control beta-blocker consider  adding ACE ARB or Arni as well as spironolactone Continue current management consider cardiac MRI   Susan JONETTA Lovelace, MD 08/28/2024 3:12 PM

## 2024-08-29 ENCOUNTER — Inpatient Hospital Stay: Admitting: Anesthesiology

## 2024-08-29 ENCOUNTER — Encounter: Payer: Self-pay | Admitting: Internal Medicine

## 2024-08-29 ENCOUNTER — Encounter
Admission: EM | Disposition: A | Discharge status: Home / Self Care | Payer: Self-pay | Source: Home / Self Care | Attending: Internal Medicine

## 2024-08-29 DIAGNOSIS — E870 Hyperosmolality and hypernatremia: Secondary | ICD-10-CM | POA: Insufficient documentation

## 2024-08-29 DIAGNOSIS — N182 Chronic kidney disease, stage 2 (mild): Secondary | ICD-10-CM | POA: Diagnosis not present

## 2024-08-29 DIAGNOSIS — I130 Hypertensive heart and chronic kidney disease with heart failure and stage 1 through stage 4 chronic kidney disease, or unspecified chronic kidney disease: Secondary | ICD-10-CM | POA: Diagnosis not present

## 2024-08-29 DIAGNOSIS — I480 Paroxysmal atrial fibrillation: Secondary | ICD-10-CM | POA: Diagnosis not present

## 2024-08-29 DIAGNOSIS — I5043 Acute on chronic combined systolic (congestive) and diastolic (congestive) heart failure: Secondary | ICD-10-CM | POA: Diagnosis not present

## 2024-08-29 HISTORY — PX: CARDIOVERSION: SHX1299

## 2024-08-29 LAB — BASIC METABOLIC PANEL WITH GFR
Anion gap: 15 (ref 5–15)
BUN: 36 mg/dL — ABNORMAL HIGH (ref 8–23)
CO2: 25 mmol/L (ref 22–32)
Calcium: 8.7 mg/dL — ABNORMAL LOW (ref 8.9–10.3)
Chloride: 106 mmol/L (ref 98–111)
Creatinine, Ser: 1.01 mg/dL — ABNORMAL HIGH (ref 0.44–1.00)
GFR, Estimated: 59 mL/min — ABNORMAL LOW (ref >60)
Glucose, Bld: 106 mg/dL — ABNORMAL HIGH (ref 70–99)
Potassium: 3.8 mmol/L (ref 3.5–5.1)
Sodium: 146 mmol/L — ABNORMAL HIGH (ref 135–145)

## 2024-08-29 LAB — MAGNESIUM: Magnesium: 2.4 mg/dL (ref 1.7–2.4)

## 2024-08-29 LAB — LIPOPROTEIN A (LPA): Lipoprotein (a): 204 nmol/L — ABNORMAL HIGH (ref <75.0)

## 2024-08-29 SURGERY — CARDIOVERSION
Anesthesia: General

## 2024-08-29 MED ORDER — SODIUM CHLORIDE 0.9 % IV SOLN: INTRAVENOUS | Status: DC | PRN

## 2024-08-29 MED ORDER — DIGOXIN 125 MCG PO TABS
0.1250 mg | ORAL_TABLET | Freq: Every day | ORAL | Status: DC
  Administered 2024-08-30 – 2024-08-31 (×2): 0.125 mg via ORAL
  Filled 2024-08-29 (×2): qty 1

## 2024-08-29 MED ORDER — METOPROLOL SUCCINATE ER 50 MG PO TB24
75.0000 mg | ORAL_TABLET | Freq: Two times a day (BID) | ORAL | Status: DC
  Administered 2024-08-29 – 2024-08-31 (×4): 75 mg via ORAL
  Filled 2024-08-29 (×4): qty 1

## 2024-08-29 MED ORDER — PHENYLEPHRINE 80 MCG/ML (10ML) SYRINGE FOR IV PUSH (FOR BLOOD PRESSURE SUPPORT)
PREFILLED_SYRINGE | INTRAVENOUS | Status: DC | PRN
  Administered 2024-08-29: 80 ug via INTRAVENOUS

## 2024-08-29 MED ORDER — SODIUM CHLORIDE 0.9 % IV SOLN: INTRAVENOUS | Status: DC

## 2024-08-29 MED ORDER — METOPROLOL TARTRATE 25 MG PO TABS
25.0000 mg | ORAL_TABLET | Freq: Once | ORAL | Status: AC
  Administered 2024-08-29: 25 mg via ORAL
  Filled 2024-08-29: qty 1

## 2024-08-29 MED ORDER — PROPOFOL 10 MG/ML IV BOLUS
INTRAVENOUS | Status: DC | PRN
  Administered 2024-08-29: 60 mg via INTRAVENOUS

## 2024-08-29 NOTE — Plan of Care (Signed)
°  Problem: Education: Goal: Knowledge of General Education information will improve Description: Including pain rating scale, medication(s)/side effects and non-pharmacologic comfort measures Outcome: Progressing   Problem: Clinical Measurements: Goal: Ability to maintain clinical measurements within normal limits will improve Outcome: Progressing Goal: Will remain free from infection Outcome: Progressing Goal: Diagnostic test results will improve Outcome: Progressing Goal: Respiratory complications will improve Outcome: Progressing Goal: Cardiovascular complication will be avoided Outcome: Progressing   Problem: Activity: Goal: Risk for activity intolerance will decrease Outcome: Progressing   Problem: Elimination: Goal: Will not experience complications related to bowel motility Outcome: Progressing Goal: Will not experience complications related to urinary retention Outcome: Progressing   Problem: Safety: Goal: Ability to remain free from injury will improve Outcome: Progressing

## 2024-08-29 NOTE — Anesthesia Preprocedure Evaluation (Addendum)
 Anesthesia Evaluation  Patient identified by MRN, date of birth, ID band Patient awake    Reviewed: Allergy & Precautions, NPO status , Patient's Chart, lab work & pertinent test results  History of Anesthesia Complications (+) PONV and history of anesthetic complications  Airway Mallampati: IV   Neck ROM: Full    Dental no notable dental hx.    Pulmonary neg pulmonary ROS   Pulmonary exam normal breath sounds clear to auscultation       Cardiovascular hypertension, +CHF (acute on chronic)  + dysrhythmias (a fib on Xarelto ) + Valvular Problems/Murmurs (severe MR and TR)  Rhythm:Irregular Rate:Normal  Hx DVT s/p IVC filter   Echo 08/2024:  1. Left ventricular ejection fraction, by estimation, is 35 to 40%. Left ventricular ejection fraction by 2D MOD biplane is 37.2 %. The left ventricle has moderately decreased function. The left ventricle demonstrates regional wall motion abnormalities (see scoring diagram/findings for description). Left ventricular diastolic parameters are consistent with Grade III diastolic dysfunction (restrictive).   2. Right ventricular systolic function is normal. The right ventricular size is normal.   3. Left atrial size was mildly dilated.   4. The mitral valve is abnormal. Severe mitral valve regurgitation. No evidence of mitral stenosis.   5. The tricuspid valve is abnormal. Tricuspid valve regurgitation is severe.   6. The aortic valve is normal in structure. Aortic valve regurgitation is not visualized. No aortic stenosis is present.   7. The inferior vena cava is normal in size with greater than 50% respiratory variability, suggesting right atrial pressure of 3 mmHg.  Myocardial perfusion 02/02/23:   Normal myocardial perfusion scan no evidence of stress-induced myocardial ischemia ejection fraction of 75% with normal wall motion conclusion negative scan      Neuro/Psych negative neurological  ROS     GI/Hepatic ,GERD  ,,  Endo/Other  Hypothyroidism  Obesity   Renal/GU Renal disease (stage II CKD; nephrolithiasis)     Musculoskeletal   Abdominal   Peds  Hematology Breast CA   Anesthesia Other Findings   Reproductive/Obstetrics                              Anesthesia Physical Anesthesia Plan  ASA: 3  Anesthesia Plan: General   Post-op Pain Management:    Induction: Intravenous  PONV Risk Score and Plan: 4 or greater and Propofol  infusion, TIVA and Treatment may vary due to age or medical condition  Airway Management Planned: Natural Airway  Additional Equipment:   Intra-op Plan:   Post-operative Plan:   Informed Consent: I have reviewed the patients History and Physical, chart, labs and discussed the procedure including the risks, benefits and alternatives for the proposed anesthesia with the patient or authorized representative who has indicated his/her understanding and acceptance.       Plan Discussed with: CRNA  Anesthesia Plan Comments: (LMA/GETA backup discussed.  Patient consented for risks of anesthesia including but not limited to:  - adverse reactions to medications - damage to eyes, teeth, lips or other oral mucosa - nerve damage due to positioning  - sore throat or hoarseness - damage to heart, brain, nerves, lungs, other parts of body or loss of life  Informed patient about role of CRNA in peri- and intra-operative care.  Patient voiced understanding.)         Anesthesia Quick Evaluation

## 2024-08-29 NOTE — Anesthesia Postprocedure Evaluation (Signed)
 Anesthesia Post Note  Patient: Susan Myers  Procedure(s) Performed: CARDIOVERSION  Patient location during evaluation: PACU Anesthesia Type: General Level of consciousness: awake and alert, oriented and patient cooperative Pain management: pain level controlled Vital Signs Assessment: post-procedure vital signs reviewed and stable Respiratory status: spontaneous breathing, nonlabored ventilation and respiratory function stable Cardiovascular status: blood pressure returned to baseline and stable Postop Assessment: adequate PO intake Anesthetic complications: no   No notable events documented.   Last Vitals:  Vitals:   08/29/24 1354 08/29/24 1406  BP:  111/71  Pulse: (!) 58 71  Resp: 13 (!) 21  Temp:    SpO2: 94% 95%    Last Pain:  Vitals:   08/29/24 1313  TempSrc: Oral  PainSc: 0-No pain                 Natahlia Hoggard

## 2024-08-29 NOTE — Transfer of Care (Signed)
 Immediate Anesthesia Transfer of Care Note  Patient: Susan Myers  Procedure(s) Performed: CARDIOVERSION  Patient Location: Cath Lab, Special recovery room 22  Anesthesia Type:General  Level of Consciousness: awake, alert , and drowsy  Airway & Oxygen Therapy: Patient Spontanous Breathing and Patient connected to nasal cannula oxygen  Post-op Assessment: Report given to RN and Post -op Vital signs reviewed and stable  Post vital signs: Reviewed and stable  Last Vitals:  Vitals Value Taken Time  BP 101/68 08/29/24 13:51  Temp 35.9 1354  Pulse 58 08/29/24 13:54  Resp 13 08/29/24 13:54  SpO2 94 % 08/29/24 13:54    Last Pain:  Vitals:   08/29/24 1313  TempSrc: Oral  PainSc: 0-No pain         Complications: No notable events documented.

## 2024-08-29 NOTE — Progress Notes (Signed)
 Patient stable. Bedside handoff given to [facility].

## 2024-08-29 NOTE — Progress Notes (Signed)
 Heart Failure Stewardship Pharmacy Note  PCP: Auston Reyes BIRCH, MD PCP-Cardiologist: None  HPI: Susan Myers is a 73 y.o. female with PAF on Xarelto , HTN, breast cancer status post lumpectomy radiation, on chronic hormonal manipulation, hypothyroidism who presented with dyspnea on exertion, orthopnea, and lower extremity edema. Started on diltiazem  recently outpatient for atrial fibrillation, after which symptoms worsened. On admission, BNP was 200, HS-troponin was 5, and TSH 4.732. ECG showed AF with RVR at 158 bpm. Chest x-ray noted central pulmonary vascular congestion and trace bilateral pleural effusions. Previously normal LVEF and no ischemia on stress test 01/2023. TTE this admission noted LVEF of 35-40%, G3DD, severe MR.  Pertinent Lab Values: Creatinine, Ser  Date Value Ref Range Status  08/29/2024 1.01 (H) 0.44 - 1.00 mg/dL Final   BUN  Date Value Ref Range Status  08/29/2024 36 (H) 8 - 23 mg/dL Final   Potassium  Date Value Ref Range Status  08/29/2024 3.8 3.5 - 5.1 mmol/L Final   Sodium  Date Value Ref Range Status  08/29/2024 146 (H) 135 - 145 mmol/L Final   B Natriuretic Peptide  Date Value Ref Range Status  08/24/2024 200.0 (H) 0.0 - 100.0 pg/mL Final    Comment:    Performed at [facility], [address] [place], [place] [address]   Magnesium   Date Value Ref Range Status  08/29/2024 2.4 1.7 - 2.4 mg/dL Final    Comment:    Performed at [facility], [address] [place], [place] [address]   Hgb A1c MFr Bld  Date Value Ref Range Status  11/23/2015 6.0 4.0 - 6.0 % Final   TSH  Date Value Ref Range Status  08/24/2024 2.947 0.350 - 4.500 uIU/mL Final    Comment:    Performed by a 3rd Generation assay with a functional sensitivity of <=0.01 uIU/mL. Performed at [facility], [address] [place], [place] [address]     Vital Signs:  Temp:  [97.5 F (36.4 C)-98 F (36.7 C)] 97.8 F (36.6 C) (10/06  0735) Pulse Rate:  [57-92] 61 (10/06 0735) Cardiac Rhythm: Atrial fibrillation (10/05 1937) Resp:  [18] 18 (10/06 0433) BP: (94-111)/(59-96) 94/60 (10/06 0735) SpO2:  [94 %-96 %] 94 % (10/06 0735)  Intake/Output Summary (Last 24 hours) at 08/29/2024 1025 Last data filed at 08/29/2024 0300 Gross per 24 hour  Intake 459.47 ml  Output 1200 ml  Net -740.53 ml    Current Heart Failure Medications:  Loop diuretic: furosemide 40 mg PO daily Beta-Blocker: metoprolol succinate 50 mg BID ACEI/ARB/ARNI: none MRA: none SGLT2i: Jardiance 10 mg daily Other: none  Prior to admission Heart Failure Medications:  Loop diuretic: none Beta-Blocker: none ACEI/ARB/ARNI: none MRA: none SGLT2i: none Other: diltiazem  240 mg daily, hydrochlorothiazide  25 mg daily  Assessment: 1. Acute combined systolic and diastolic heart failure (LVEF 35-40%) with G3DD, due to unknown etiology. Complicated by AF RVR. NYHA class III symptoms.  -Symptoms: Reports shortness of breath is improved though remains present. LEE improved.   -Volume: Appears to be euvolemic this morning. Continue Furosemide 40 mg PO daily. -Hemodynamics: BP lower end of normal. Rate is down on amiodarone and metoprolol, KC planning DCCV today. -BB: Continue metoprolol succinate 50 mg BID. This will require reduction once NSR is restored most likely. -ACEI/ARB/ARNI: BP currently too soft for initiation, can consider adding if improved over the weekend. -MRA: Consider adding if BP is stable ~110 mmHg after metoprolol adjustments post DCCV. -SGLT2i: Continue Jardiance 10 mg  daily.  Plan: 1) Medication changes recommended at this time: -None at this time  2) Patient assistance: -Pending  3) Education: - Patient has been educated on current HF medications and potential additions to HF medication regimen - Patient verbalizes understanding that over the next few months, these medication doses may change and more medications may be added to  optimize HF regimen - Patient has been educated on basic disease state pathophysiology and goals of therapy  Medication Assistance / Insurance Benefits Check: Does the patient have prescription insurance?    Please do not hesitate to reach out with questions or concerns,  Jaun Bash, PharmD, CPP, BCPS, [facility] Heart Failure Pharmacist  Phone - [phone] 08/29/2024 10:25 AM

## 2024-08-29 NOTE — Progress Notes (Signed)
  Progress Note   Patient: Susan Myers FMW:[id number] DOB: [birth year] DOA: 08/24/2024     5 DOS: the patient was seen and examined on 08/29/2024   Brief hospital course: Susan Myers is a 73 y.o. female with medical history significant of PAF on Xarelto , HTN, breast cancer status post lumpectomy radiation, on chronic hormonal manipulation, hypothyroidism, presented with worsening of cough shortness of breath and peripheral edema.  Upon arriving to hospital, she was found to have atrial fibrillation with RVR.  Amiodarone was started.  Patient was also given beta-blocker. Patient is seen by cardiology, echocardiogram showed ejection fraction of 35%.  Started on IV Lasix for acute exacerbation of congestive heart failure.  Heart cath scheduled for 10/3.    Active Problems:   A-fib (HCC)   Acute on chronic combined systolic and diastolic CHF (congestive heart failure) (HCC)   Hypokalemia   Paroxysmal atrial fibrillation with RVR (HCC)   Hypernatremia   Assessment and Plan: Paroxysmal atrial fibrillation with RVR. Patient has been followed by cardiology, was on amiodarone and heparin  drip.  Added beta-blocker.  Anticoagulation changed to Xarelto  She is having  back-and-forth with sinus and atrial fibrillation.  Currently atrial fibrillation with heart rate 120s to 140s.  Increase dose of metoprolol to 50 mg twice a day.  Patient was also given digoxin. Cardioversion scheduled for today.  Probably can be discharged tomorrow if sinus rhythm can be maintained.     Acute on chronic combined systolic and diastolic congestive heart failure. Severe mitral regurgitation. Severe tricuspid regurgitation. Prior echocardiogram showed normal ejection fraction, currently ejection fraction was 35% to 40% with grade 3 diastolic dysfunction, severe mitral regurgitation and severe tricuspid regurgitation. Patient is treated with IV Lasix.  Beta-blocker. Volume status much better.  Coronary  angiogram showed nonobstructive coronary disease.  Furosemide changed to oral 10/6.    Hypokalemia. Chronic kidney disease stage II. Mild hypernatremia. Potassium normalized.  Patient has slightly worsening renal function, does not have stage III CKD, does not meet criteria for AKI Recheck of BMP tomorrow.   Essential hypertension. Continue beta-blocker.   Hypothyroidism. Continue Synthroid .  TSH normal.   History of breast cancer. On letrozole .         Subjective:  Patient doing well, no short of breath.  Physical Exam: Vitals:   08/29/24 0001 08/29/24 0433 08/29/24 0735 08/29/24 1103  BP: 102/65 107/80 94/60 102/71  Pulse: 65  61   Resp:  18  16  Temp:  98 F (36.7 C) 97.8 F (36.6 C) 97.6 F (36.4 C)  TempSrc:      SpO2:  96% 94% 94%   General exam: Appears calm and comfortable  Respiratory system: Clear to auscultation. Respiratory effort normal. Cardiovascular system: Irregularly irregular, tachycardic.SABRA No JVD, murmurs, rubs, gallops or clicks. No pedal edema. Gastrointestinal system: Abdomen is nondistended, soft and nontender. No organomegaly or masses felt. Normal bowel sounds heard. Central nervous system: Alert and oriented. No focal neurological deficits. Extremities: Symmetric 5 x 5 power. Skin: No rashes, lesions or ulcers Psychiatry: Judgement and insight appear normal. Mood & affect appropriate.    Data Reviewed:  Lab results reviewed.  Family Communication: Husband updated at bedside.  Disposition: Status is: Inpatient Remains inpatient appropriate because: Severity of disease, inpatient procedure.     Time spent: 35 minutes  Author: Murvin Mana, MD 08/29/2024 12:21 PM  For on call review [email or web address].

## 2024-08-29 NOTE — Plan of Care (Signed)

## 2024-08-29 NOTE — Progress Notes (Cosign Needed Addendum)
 [facility] CLINIC CARDIOLOGY PROGRESS NOTE   Patient ID: Susan Myers MRN: [id number] DOB/AGE: 74-05-1951 74 y.o.  Admit date: 08/24/2024 Referring Physician Dr Murvin Mana  Primary Physician Sparks, Reyes BIRCH, MD  Primary Cardiologist Dr. Florencio Reason for Consultation AF RVR  HPI: Susan Myers is a 73 y.o. female with a past medical history of paroxysmal atrial fibrillation on xarelto , hypertension, hypothyroidism, breast ca s/p lumpectomy & radiation on chronic hormone therapy who presented to the ED on 08/24/2024 for cough, SOB. Found to be in AF RVR. Cardiology was consulted for further evaluation.   Interval History:  -Patient seen and examined this AM, resting in bed with family at bedside.  -HR remains elevated in AF on tele. Patient denies CP or palpitations this AM.  -BP remains stable overall.   Review of systems complete and found to be negative unless listed above   Vitals:   08/28/24 2335 08/29/24 0001 08/29/24 0433 08/29/24 0735  BP: (!) 95/59 102/65 107/80 94/60  Pulse:  65  61  Resp: 18  18   Temp: 97.7 F (36.5 C)  98 F (36.7 C) 97.8 F (36.6 C)  TempSrc:      SpO2: 94%  96% 94%     Intake/Output Summary (Last 24 hours) at 08/29/2024 0949 Last data filed at 08/29/2024 0300 Gross per 24 hour  Intake 459.47 ml  Output 1200 ml  Net -740.53 ml     PHYSICAL EXAM General: Chronically ill appearing elderly female, well nourished, in no acute distress. HEENT: Normocephalic and atraumatic. Neck: No JVD.  Lungs: Normal respiratory effort on room air. Clear bilaterally to auscultation. No wheezes, crackles, rhonchi.  Heart: irregularly irregular, fast rate. Normal S1 and S2 without gallops or murmurs. Radial & DP pulses 2+ bilaterally. Abdomen: Non-distended appearing.  Msk: Normal strength and tone for age. Extremities: No clubbing, cyanosis or edema.   Neuro: Alert and oriented X 3. Psych: Mood appropriate, affect congruent.    LABS: Basic  Metabolic Panel: Recent Labs    08/28/24 0426 08/29/24 0538  NA 145 146*  K 4.0 3.8  CL 105 106  CO2 24 25  GLUCOSE 97 106*  BUN 35* 36*  CREATININE 1.04* 1.01*  CALCIUM 9.1 8.7*  MG 2.5* 2.4   Liver Function Tests: No results for input(s): AST, ALT, ALKPHOS, BILITOT, PROT, ALBUMIN in the last 72 hours. No results for input(s): LIPASE, AMYLASE in the last 72 hours. CBC: No results for input(s): WBC, NEUTROABS, HGB, HCT, MCV, PLT in the last 72 hours. Cardiac Enzymes: No results for input(s): CKTOTAL, CKMB, CKMBINDEX, TROPONINIHS in the last 72 hours. BNP: No results for input(s): BNP in the last 72 hours. D-Dimer: No results for input(s): DDIMER in the last 72 hours. Hemoglobin A1C: No results for input(s): HGBA1C in the last 72 hours. Fasting Lipid Panel: No results for input(s): CHOL, HDL, LDLCALC, TRIG, CHOLHDL, LDLDIRECT in the last 72 hours. Thyroid  Function Tests: No results for input(s): TSH, T4TOTAL, T3FREE, THYROIDAB in the last 72 hours.  Invalid input(s): FREET3 Anemia Panel: No results for input(s): VITAMINB12, FOLATE, FERRITIN, TIBC, IRON, RETICCTPCT in the last 72 hours.  No results found.   ECHO 08/2024: 1. Left ventricular ejection fraction, by estimation, is 35 to 40%. Left ventricular ejection fraction by 2D MOD biplane is 37.2 %. The left ventricle has moderately decreased function. The left ventricle demonstrates regional wall motion abnormalities (see scoring diagram/findings for description). Left ventricular diastolic parameters are consistent with Grade III diastolic dysfunction (  restrictive).   2. Right ventricular systolic function is normal. The right ventricular  size is normal.   3. Left atrial size was mildly dilated.   4. The mitral valve is abnormal. Severe mitral valve regurgitation. No  evidence of mitral stenosis.   5. The tricuspid valve is abnormal. Tricuspid  valve regurgitation is severe.   6. The aortic valve is normal in structure. Aortic valve regurgitation is  not visualized. No aortic stenosis is present.   7. The inferior vena cava is normal in size with greater than 50%  respiratory variability, suggesting right atrial pressure of 3 mmHg.   TELEMETRY reviewed by me 08/29/24: atrial fibrillation rate 100-110s  EKG reviewed by me 08/29/24: atrial fibrillation PVCs rate 153 bpm  DATA reviewed by me 08/29/24: last 24h vitals tele labs imaging I/O, hospitalist progress note  Active Problems:   A-fib (HCC)   Acute on chronic combined systolic and diastolic CHF (congestive heart failure) (HCC)   Hypokalemia   Paroxysmal atrial fibrillation with RVR (HCC)   Hypernatremia    ASSESSMENT AND PLAN: Susan Myers is a 73 y.o. female with a past medical history of paroxysmal atrial fibrillation on xarelto , hypertension, hypothyroidism, breast ca s/p lumpectomy & radiation on chronic hormone therapy who presented to the ED on 08/24/2024 for cough, SOB. Found to be in AF RVR. Cardiology was consulted for further evaluation.   # Atrial fibrillation RVR # Paroxysmal atrial fibrillation # Hypertension Patient presented with cough, SOB. Found to be in AF RVR. Started on amiodarone.  -Plan for cardioversion today. Patient is amenable to proceeding. Written consent to be obtained. -Continue PO amiodarone load. Continue metoprolol succinate 50 mg bid.  -Continue xarelto  20 mg daily.  -Continue Jardiance 10 mg daily and lasix 40 mg daily.  -Continue simvastatin  20 mg daily.   ADDENDUM: Cardioversion successful. Patient ok for discharge this afternoon. Appointment scheduled with EP tomorrow in our clinic.   This patient's case was discussed and created with Dr. Ammon and he is in agreement.  Signed:  Danita Bloch, PA-C  08/29/2024, 9:49 AM [facility] Cardiology

## 2024-08-30 ENCOUNTER — Encounter: Payer: Self-pay | Admitting: Internal Medicine

## 2024-08-30 DIAGNOSIS — I5043 Acute on chronic combined systolic (congestive) and diastolic (congestive) heart failure: Secondary | ICD-10-CM | POA: Diagnosis not present

## 2024-08-30 DIAGNOSIS — E876 Hypokalemia: Secondary | ICD-10-CM | POA: Diagnosis not present

## 2024-08-30 DIAGNOSIS — I480 Paroxysmal atrial fibrillation: Secondary | ICD-10-CM | POA: Diagnosis not present

## 2024-08-30 LAB — MAGNESIUM: Magnesium: 2.6 mg/dL — ABNORMAL HIGH (ref 1.7–2.4)

## 2024-08-30 LAB — BASIC METABOLIC PANEL WITH GFR
Anion gap: 8 (ref 5–15)
Anion gap: 8 (ref 5–15)
BUN: 36 mg/dL — ABNORMAL HIGH (ref 8–23)
BUN: 36 mg/dL — ABNORMAL HIGH (ref 8–23)
CO2: 27 mmol/L (ref 22–32)
CO2: 27 mmol/L (ref 22–32)
Calcium: 8.5 mg/dL — ABNORMAL LOW (ref 8.9–10.3)
Calcium: 8.9 mg/dL (ref 8.9–10.3)
Chloride: 107 mmol/L (ref 98–111)
Chloride: 108 mmol/L (ref 98–111)
Creatinine, Ser: 0.98 mg/dL (ref 0.44–1.00)
Creatinine, Ser: 1.05 mg/dL — ABNORMAL HIGH (ref 0.44–1.00)
GFR, Estimated: >60 mL/min (ref >60)
GFR, Estimated: 56 mL/min — ABNORMAL LOW (ref >60)
Glucose, Bld: 114 mg/dL — ABNORMAL HIGH (ref 70–99)
Glucose, Bld: 114 mg/dL — ABNORMAL HIGH (ref 70–99)
Potassium: 3.6 mmol/L (ref 3.5–5.1)
Potassium: 4 mmol/L (ref 3.5–5.1)
Sodium: 142 mmol/L (ref 135–145)
Sodium: 143 mmol/L (ref 135–145)

## 2024-08-30 MED ORDER — AMIODARONE HCL 200 MG PO TABS
200.0000 mg | ORAL_TABLET | Freq: Once | ORAL | Status: AC
  Administered 2024-08-30: 200 mg via ORAL
  Filled 2024-08-30: qty 1

## 2024-08-30 MED ORDER — AMIODARONE HCL 200 MG PO TABS: 200.0000 mg | ORAL_TABLET | Freq: Every day | ORAL | Status: DC

## 2024-08-30 MED ORDER — AMIODARONE HCL 200 MG PO TABS
400.0000 mg | ORAL_TABLET | Freq: Two times a day (BID) | ORAL | Status: DC
  Administered 2024-08-30 – 2024-08-31 (×2): 400 mg via ORAL
  Filled 2024-08-30 (×2): qty 2

## 2024-08-30 NOTE — Progress Notes (Signed)
 Progress Note   Patient: Susan Myers FMW:[id number] DOB: 01-Apr-1951 DOA: 08/24/2024     6 DOS: the patient was seen and examined on 08/30/2024   Brief hospital course: Susan Myers is a 73 y.o. female with medical history significant of PAF on Xarelto , HTN, breast cancer status post lumpectomy radiation, on chronic hormonal manipulation, hypothyroidism, presented with worsening of cough shortness of breath and peripheral edema.  Upon arriving to hospital, she was found to have atrial fibrillation with RVR.  Amiodarone was started.  Patient was also given beta-blocker. Patient is seen by cardiology, echocardiogram showed ejection fraction of 35%.  Started on IV Lasix for acute exacerbation of congestive heart failure.  Heart cath scheduled for 10/3.    Active Problems:   A-fib (HCC)   Acute on chronic combined systolic and diastolic CHF (congestive heart failure) (HCC)   Hypokalemia   Paroxysmal atrial fibrillation with RVR (HCC)   Hypernatremia   Assessment and Plan: Paroxysmal atrial fibrillation with RVR. Patient has been followed by cardiology, was on amiodarone and heparin  drip.  Added beta-blocker.  Anticoagulation changed to Xarelto  She is having  back-and-forth with sinus and atrial fibrillation.  Currently atrial fibrillation with heart rate 120s to 140s.  Patient was given IV digoxin of 0.75 mg, followed by 0.15 mg daily, beta-blocker has increased to metoprolol 75 mg twice a day.  Patient had a cardioversion on 10/6, converted to sinus, but relapsed into atrial fibrillation in 2 hours. Currently, blood pressure is borderline, not able to increase dose of beta-blocker.  Followed by cardiology, increased amiodarone to 400 mg twice a day. Continue to follow closely, may need additional 1 to 2 days before patient can be discharged home.     Acute on chronic combined systolic and diastolic congestive heart failure. Severe mitral regurgitation. Severe tricuspid  regurgitation. Prior echocardiogram showed normal ejection fraction, currently ejection fraction was 35% to 40% with grade 3 diastolic dysfunction, severe mitral regurgitation and severe tricuspid regurgitation. Patient was treated with IV Lasix.  Beta-blocker. Volume status much better.  Coronary angiogram showed nonobstructive coronary disease.  Furosemide changed to oral 10/6.  Patient volume status has improved.   Hypokalemia. Chronic kidney disease stage II. Mild hypernatremia. Potassium normalized.  Patient has slightly worsening renal function, does not have stage III CKD, does not meet criteria for AKI Potassium has normalized.   Essential hypertension. Continue beta-blocker.   Hypothyroidism. Continue Synthroid .  TSH normal.   History of breast cancer. On letrozole .        Subjective:  Patient feels well today, no short of breath.  No dizziness  Physical Exam: Vitals:   08/29/24 1953 08/29/24 2331 08/30/24 0432 08/30/24 0759  BP: 97/62 (!) 87/55 91/79 90/68   Pulse: (!) 106 90 95 68  Resp: 17 17 18    Temp: 97.8 F (36.6 C) (!) 96.9 F (36.1 C) (!) 97.5 F (36.4 C) 97.7 F (36.5 C)  TempSrc:    Oral  SpO2: 95% 94% 94% 95%   General exam: Appears calm and comfortable  Respiratory system: Clear to auscultation. Respiratory effort normal. Cardiovascular system: Irregularly irregular, tachycardic. No JVD, murmurs, rubs, gallops or clicks. No pedal edema. Gastrointestinal system: Abdomen is nondistended, soft and nontender. No organomegaly or masses felt. Normal bowel sounds heard. Central nervous system: Alert and oriented. No focal neurological deficits. Extremities: Symmetric 5 x 5 power. Skin: No rashes, lesions or ulcers Psychiatry: Judgement and insight appear normal. Mood & affect appropriate.    Data Reviewed:  Lab results reviewed.  Family Communication: Husband updated at bedside.  Disposition: Status is: Inpatient Remains inpatient appropriate  because: Severity of disease.     Time spent: 35 minutes  Author: Murvin Mana, MD 08/30/2024 11:38 AM  For on call review [email or web address].

## 2024-08-30 NOTE — Plan of Care (Signed)

## 2024-08-30 NOTE — Plan of Care (Signed)

## 2024-08-30 NOTE — Progress Notes (Signed)
 Heart Failure Stewardship Pharmacy Note  PCP: Auston Reyes BIRCH, MD PCP-Cardiologist: None  HPI: Susan Myers is a 73 y.o. female with PAF on Xarelto , HTN, breast cancer status post lumpectomy radiation, on chronic hormonal manipulation, hypothyroidism who presented with dyspnea on exertion, orthopnea, and lower extremity edema. Started on diltiazem  recently outpatient for atrial fibrillation, after which symptoms worsened. On admission, BNP was 200, HS-troponin was 5, and TSH 4.732. ECG showed AF with RVR at 158 bpm. Chest x-ray noted central pulmonary vascular congestion and trace bilateral pleural effusions. Previously normal LVEF and no ischemia on stress test 01/2023. TTE this admission noted LVEF of 35-40%, G3DD, severe MR. Patient underwent DCCV yesterday, but converted back to AF afterwards.   Pertinent Lab Values: Creatinine, Ser  Date Value Ref Range Status  08/30/2024 1.05 (H) 0.44 - 1.00 mg/dL Final   BUN  Date Value Ref Range Status  08/30/2024 36 (H) 8 - 23 mg/dL Final   Potassium  Date Value Ref Range Status  08/30/2024 4.0 3.5 - 5.1 mmol/L Final   Sodium  Date Value Ref Range Status  08/30/2024 143 135 - 145 mmol/L Final   B Natriuretic Peptide  Date Value Ref Range Status  08/24/2024 200.0 (H) 0.0 - 100.0 pg/mL Final    Comment:    Performed at [facility], [address], [place], [place] [address]   Magnesium   Date Value Ref Range Status  08/30/2024 2.6 (H) 1.7 - 2.4 mg/dL Final    Comment:    Performed at [facility], [address] [place], [place] [address]   Hgb A1c MFr Bld  Date Value Ref Range Status  11/23/2015 6.0 4.0 - 6.0 % Final   TSH  Date Value Ref Range Status  08/24/2024 2.947 0.350 - 4.500 uIU/mL Final    Comment:    Performed by a 3rd Generation assay with a functional sensitivity of <=0.01 uIU/mL. Performed at [facility], [address] [place], [place] [address]     Vital Signs:  Temp:   [96.3 F (35.7 C)-98 F (36.7 C)] 97.7 F (36.5 C) (10/07 0759) Pulse Rate:  [54-119] 68 (10/07 0759) Cardiac Rhythm: Atrial fibrillation (10/07 0701) Resp:  [13-31] 18 (10/07 0432) BP: (87-121)/(55-90) 90/68 (10/07 0759) SpO2:  [89 %-98 %] 95 % (10/07 0759)  Intake/Output Summary (Last 24 hours) at 08/30/2024 1116 Last data filed at 08/29/2024 2039 Gross per 24 hour  Intake 160 ml  Output 1100 ml  Net -940 ml    Current Heart Failure Medications:  Loop diuretic: furosemide 40 mg PO daily Beta-Blocker: metoprolol succinate 75 mg BID ACEI/ARB/ARNI: none MRA: none SGLT2i: Jardiance 10 mg daily Other: digoxin 0.125 mg daily  Prior to admission Heart Failure Medications:  Loop diuretic: none Beta-Blocker: none ACEI/ARB/ARNI: none MRA: none SGLT2i: none Other: diltiazem  240 mg daily, hydrochlorothiazide  25 mg daily  Assessment: 1. Acute combined systolic and diastolic heart failure (LVEF 35-40%) with G3DD, due to unknown etiology. Complicated by AF RVR. NYHA class III symptoms.  -Symptoms: Reports shortness of breath is back to baseline. LEE improved.   -Volume: Appears to be euvolemic this morning. Continue Furosemide 40 mg PO daily. -Hemodynamics: BP low. Rate is still 110s. -BB: Continue metoprolol succinate 75 mg BID. This may require reduction once NSR is restored most likely. -ACEI/ARB/ARNI: BP currently too soft for initiation, can consider adding outpatient. -MRA: Consider adding when BP is stable ~110 mmHg. -SGLT2i: Continue Jardiance 10 mg daily.  Plan: 1) Medication changes  recommended at this time: -None at this time  2) Patient assistance: -Pending  3) Education: - Patient has been educated on current HF medications and potential additions to HF medication regimen - Patient verbalizes understanding that over the next few months, these medication doses may change and more medications may be added to optimize HF regimen - Patient has been educated on basic  disease state pathophysiology and goals of therapy  Medication Assistance / Insurance Benefits Check: Does the patient have prescription insurance?    Please do not hesitate to reach out with questions or concerns,  Jaun Bash, PharmD, CPP, BCPS, [facility] Heart Failure Pharmacist  Phone - [phone] 08/30/2024 11:16 AM

## 2024-08-30 NOTE — Progress Notes (Cosign Needed Addendum)
 [facility] CLINIC CARDIOLOGY PROGRESS NOTE   Patient ID: Susan Myers MRN: [id number] DOB/AGE: 1950/12/02 73 y.o.  Admit date: 08/24/2024 Referring Physician Dr Murvin Mana  Primary Physician Sparks, Reyes BIRCH, MD  Primary Cardiologist Dr. Florencio Reason for Consultation AF RVR  HPI: Susan Myers is a 73 y.o. female with a past medical history of paroxysmal atrial fibrillation on xarelto , hypertension, hypothyroidism, breast ca s/p lumpectomy & radiation on chronic hormone therapy who presented to the ED on 08/24/2024 for cough, SOB. Found to be in AF RVR. Cardiology was consulted for further evaluation.   Interval History:  -Patient seen and examined this AM, resting in bedside chair.  -Underwent cardioversion yesterday which was successful however converted back in to AF afterwards. AF on tele with rates in the 100-110s. Patient denies CP or palpitations this AM.  -BP remains stable overall.   Review of systems complete and found to be negative unless listed above   Vitals:   08/29/24 1953 08/29/24 2331 08/30/24 0432 08/30/24 0759  BP: 97/62 (!) 87/55 91/79 90/68   Pulse: (!) 106 90 95 68  Resp: 17 17 18    Temp: 97.8 F (36.6 C) (!) 96.9 F (36.1 C) (!) 97.5 F (36.4 C) 97.7 F (36.5 C)  TempSrc:    Oral  SpO2: 95% 94% 94% 95%     Intake/Output Summary (Last 24 hours) at 08/30/2024 9047 Last data filed at 08/29/2024 2039 Gross per 24 hour  Intake 160 ml  Output 1600 ml  Net -1440 ml     PHYSICAL EXAM General: Chronically ill appearing elderly female, well nourished, in no acute distress. HEENT: Normocephalic and atraumatic. Neck: No JVD.  Lungs: Normal respiratory effort on room air. Clear bilaterally to auscultation. No wheezes, crackles, rhonchi.  Heart: irregularly irregular, fast rate. Normal S1 and S2 without gallops or murmurs. Radial & DP pulses 2+ bilaterally. Abdomen: Non-distended appearing.  Msk: Normal strength and tone for age. Extremities: No  clubbing, cyanosis or edema.   Neuro: Alert and oriented X 3. Psych: Mood appropriate, affect congruent.    LABS: Basic Metabolic Panel: Recent Labs    08/29/24 0538 08/30/24 0032 08/30/24 0425  NA 146* 142 143  K 3.8 3.6 4.0  CL 106 107 108  CO2 25 27 27   GLUCOSE 106* 114* 114*  BUN 36* 36* 36*  CREATININE 1.01* 0.98 1.05*  CALCIUM 8.7* 8.5* 8.9  MG 2.4 2.6*  --    Liver Function Tests: No results for input(s): AST, ALT, ALKPHOS, BILITOT, PROT, ALBUMIN in the last 72 hours. No results for input(s): LIPASE, AMYLASE in the last 72 hours. CBC: No results for input(s): WBC, NEUTROABS, HGB, HCT, MCV, PLT in the last 72 hours. Cardiac Enzymes: No results for input(s): CKTOTAL, CKMB, CKMBINDEX, TROPONINIHS in the last 72 hours. BNP: No results for input(s): BNP in the last 72 hours. D-Dimer: No results for input(s): DDIMER in the last 72 hours. Hemoglobin A1C: No results for input(s): HGBA1C in the last 72 hours. Fasting Lipid Panel: No results for input(s): CHOL, HDL, LDLCALC, TRIG, CHOLHDL, LDLDIRECT in the last 72 hours. Thyroid  Function Tests: No results for input(s): TSH, T4TOTAL, T3FREE, THYROIDAB in the last 72 hours.  Invalid input(s): FREET3 Anemia Panel: No results for input(s): VITAMINB12, FOLATE, FERRITIN, TIBC, IRON, RETICCTPCT in the last 72 hours.  No results found.   ECHO 08/2024: 1. Left ventricular ejection fraction, by estimation, is 35 to 40%. Left ventricular ejection fraction by 2D MOD biplane is 37.2 %. The  left ventricle has moderately decreased function. The left ventricle demonstrates regional wall motion abnormalities (see scoring diagram/findings for description). Left ventricular diastolic parameters are consistent with Grade III diastolic dysfunction (restrictive).   2. Right ventricular systolic function is normal. The right ventricular  size is normal.   3. Left  atrial size was mildly dilated.   4. The mitral valve is abnormal. Severe mitral valve regurgitation. No  evidence of mitral stenosis.   5. The tricuspid valve is abnormal. Tricuspid valve regurgitation is severe.   6. The aortic valve is normal in structure. Aortic valve regurgitation is  not visualized. No aortic stenosis is present.   7. The inferior vena cava is normal in size with greater than 50%  respiratory variability, suggesting right atrial pressure of 3 mmHg.   TELEMETRY reviewed by me 08/30/24: atrial fibrillation rate 100-110s  EKG reviewed by me 08/30/24: atrial fibrillation PVCs rate 153 bpm  DATA reviewed by me 08/30/24: last 24h vitals tele labs imaging I/O, hospitalist progress note  Active Problems:   A-fib (HCC)   Acute on chronic combined systolic and diastolic CHF (congestive heart failure) (HCC)   Hypokalemia   Paroxysmal atrial fibrillation with RVR (HCC)   Hypernatremia    ASSESSMENT AND PLAN: Susan Myers is a 73 y.o. female with a past medical history of paroxysmal atrial fibrillation on xarelto , hypertension, hypothyroidism, breast ca s/p lumpectomy & radiation on chronic hormone therapy who presented to the ED on 08/24/2024 for cough, SOB. Found to be in AF RVR. Cardiology was consulted for further evaluation.   # Atrial fibrillation RVR # Paroxysmal atrial fibrillation # Hypertension Patient presented with cough, SOB. Found to be in AF RVR. Started on amiodarone. S/p successful cardioversion yesterday however converted back into AF later in the afternoon.  -Increase PO amiodarone to 400 mg bid for 8 days, followed by 200 mg daily. Continue metoprolol succinate 75 mg bid. BP limiting further increases to metop. -Continue xarelto  20 mg daily.  -Continue Jardiance 10 mg daily and lasix 40 mg daily.  -Continue simvastatin  20 mg daily.  -Will get EP appointment rescheduled.  This patient's case was discussed and created with Dr. Ammon and he is  in agreement.  Signed:  Danita Bloch, PA-C  08/30/2024, 9:52 AM [facility] Cardiology

## 2024-08-31 DIAGNOSIS — I480 Paroxysmal atrial fibrillation: Secondary | ICD-10-CM | POA: Diagnosis not present

## 2024-08-31 LAB — BASIC METABOLIC PANEL WITH GFR
Anion gap: 11 (ref 5–15)
BUN: 39 mg/dL — ABNORMAL HIGH (ref 8–23)
CO2: 26 mmol/L (ref 22–32)
Calcium: 8.8 mg/dL — ABNORMAL LOW (ref 8.9–10.3)
Chloride: 104 mmol/L (ref 98–111)
Creatinine, Ser: 0.95 mg/dL (ref 0.44–1.00)
GFR, Estimated: >60 mL/min (ref >60)
Glucose, Bld: 111 mg/dL — ABNORMAL HIGH (ref 70–99)
Potassium: 4.1 mmol/L (ref 3.5–5.1)
Sodium: 141 mmol/L (ref 135–145)

## 2024-08-31 LAB — CBC
HCT: 41.3 % (ref 36.0–46.0)
Hemoglobin: 13.7 g/dL (ref 12.0–15.0)
MCH: 31.2 pg (ref 26.0–34.0)
MCHC: 33.2 g/dL (ref 30.0–36.0)
MCV: 94.1 fL (ref 80.0–100.0)
Platelets: 173 K/uL (ref 150–400)
RBC: 4.39 MIL/uL (ref 3.87–5.11)
RDW: 14.4 % (ref 11.5–15.5)
WBC: 8 K/uL (ref 4.0–10.5)
nRBC: 0 % (ref 0.0–0.2)

## 2024-08-31 MED ORDER — EMPAGLIFLOZIN 10 MG PO TABS: 10.0000 mg | ORAL_TABLET | Freq: Every day | ORAL | 1 refills | Status: AC

## 2024-08-31 MED ORDER — DOCUSATE SODIUM 100 MG PO CAPS: 100.0000 mg | ORAL_CAPSULE | Freq: Two times a day (BID) | ORAL | 0 refills | Status: AC

## 2024-08-31 MED ORDER — FUROSEMIDE 40 MG PO TABS: 40.0000 mg | ORAL_TABLET | Freq: Every day | ORAL | 1 refills | Status: AC

## 2024-08-31 MED ORDER — METOPROLOL SUCCINATE ER 25 MG PO TB24: 75.0000 mg | ORAL_TABLET | Freq: Two times a day (BID) | ORAL | 1 refills | Status: AC

## 2024-08-31 MED ORDER — AMIODARONE HCL 200 MG PO TABS: ORAL_TABLET | ORAL | 0 refills | Status: AC

## 2024-08-31 MED ORDER — DIGOXIN 125 MCG PO TABS: 0.1250 mg | ORAL_TABLET | Freq: Every day | ORAL | 1 refills | Status: AC

## 2024-08-31 NOTE — Progress Notes (Signed)
 [facility] CLINIC CARDIOLOGY PROGRESS NOTE   Patient ID: Susan Myers MRN: [id number] DOB/AGE: 1951/06/25 73 y.o.  Admit date: 08/24/2024 Referring Physician Dr Murvin Mana  Primary Physician Sparks, Reyes BIRCH, MD  Primary Cardiologist Dr. Florencio Reason for Consultation AF RVR  HPI: Susan Myers is a 73 y.o. female with a past medical history of paroxysmal atrial fibrillation on xarelto , hypertension, hypothyroidism, breast ca s/p lumpectomy & radiation on chronic hormone therapy who presented to the ED on 08/24/2024 for cough, SOB. Found to be in AF RVR. Cardiology was consulted for further evaluation.   Interval History:  -Patient seen and examined this AM, resting in bedside chair with husband at bedside. -Remaining in AF with HR 90-100s this AM. Will periodically increase to 120s but improves to 100 quickly.  -BP borderline low but remains stable overall.   Review of systems complete and found to be negative unless listed above   Vitals:   08/30/24 1941 08/30/24 2336 08/31/24 0352 08/31/24 0744  BP: (!) 85/74 102/85 98/68 (!) 106/91  Pulse: 71 67 88 93  Resp: 17 16 16    Temp: (!) 97.3 F (36.3 C) (!) 97.4 F (36.3 C) (!) 97.5 F (36.4 C) 97.8 F (36.6 C)  TempSrc: Oral Axillary  Oral  SpO2: 96% 94% 93% 96%     Intake/Output Summary (Last 24 hours) at 08/31/2024 0839 Last data filed at 08/30/2024 1300 Gross per 24 hour  Intake 240 ml  Output --  Net 240 ml     PHYSICAL EXAM General: Chronically ill appearing elderly female, well nourished, in no acute distress. HEENT: Normocephalic and atraumatic. Neck: No JVD.  Lungs: Normal respiratory effort on room air. Clear bilaterally to auscultation. No wheezes, crackles, rhonchi.  Heart: irregularly irregular, fast rate. Normal S1 and S2 without gallops or murmurs. Radial & DP pulses 2+ bilaterally. Abdomen: Non-distended appearing.  Msk: Normal strength and tone for age. Extremities: No clubbing, cyanosis or  edema.   Neuro: Alert and oriented X 3. Psych: Mood appropriate, affect congruent.    LABS: Basic Metabolic Panel: Recent Labs    08/29/24 0538 08/30/24 0032 08/30/24 0425 08/31/24 0546  NA 146* 142 143 141  K 3.8 3.6 4.0 4.1  CL 106 107 108 104  CO2 25 27 27 26   GLUCOSE 106* 114* 114* 111*  BUN 36* 36* 36* 39*  CREATININE 1.01* 0.98 1.05* 0.95  CALCIUM 8.7* 8.5* 8.9 8.8*  MG 2.4 2.6*  --   --    Liver Function Tests: No results for input(s): AST, ALT, ALKPHOS, BILITOT, PROT, ALBUMIN in the last 72 hours. No results for input(s): LIPASE, AMYLASE in the last 72 hours. CBC: Recent Labs    08/31/24 0546  WBC 8.0  HGB 13.7  HCT 41.3  MCV 94.1  PLT 173   Cardiac Enzymes: No results for input(s): CKTOTAL, CKMB, CKMBINDEX, TROPONINIHS in the last 72 hours. BNP: No results for input(s): BNP in the last 72 hours. D-Dimer: No results for input(s): DDIMER in the last 72 hours. Hemoglobin A1C: No results for input(s): HGBA1C in the last 72 hours. Fasting Lipid Panel: No results for input(s): CHOL, HDL, LDLCALC, TRIG, CHOLHDL, LDLDIRECT in the last 72 hours. Thyroid  Function Tests: No results for input(s): TSH, T4TOTAL, T3FREE, THYROIDAB in the last 72 hours.  Invalid input(s): FREET3 Anemia Panel: No results for input(s): VITAMINB12, FOLATE, FERRITIN, TIBC, IRON, RETICCTPCT in the last 72 hours.  No results found.   ECHO 08/2024: 1. Left ventricular ejection fraction,  by estimation, is 35 to 40%. Left ventricular ejection fraction by 2D MOD biplane is 37.2 %. The left ventricle has moderately decreased function. The left ventricle demonstrates regional wall motion abnormalities (see scoring diagram/findings for description). Left ventricular diastolic parameters are consistent with Grade III diastolic dysfunction (restrictive).   2. Right ventricular systolic function is normal. The right ventricular  size  is normal.   3. Left atrial size was mildly dilated.   4. The mitral valve is abnormal. Severe mitral valve regurgitation. No  evidence of mitral stenosis.   5. The tricuspid valve is abnormal. Tricuspid valve regurgitation is severe.   6. The aortic valve is normal in structure. Aortic valve regurgitation is  not visualized. No aortic stenosis is present.   7. The inferior vena cava is normal in size with greater than 50%  respiratory variability, suggesting right atrial pressure of 3 mmHg.   TELEMETRY reviewed by me 08/31/24: atrial fibrillation rate 90-100s  EKG reviewed by me 08/31/24: atrial fibrillation PVCs rate 153 bpm  DATA reviewed by me 08/31/24: last 24h vitals tele labs imaging I/O, hospitalist progress note  Active Problems:   A-fib (HCC)   Acute on chronic combined systolic and diastolic CHF (congestive heart failure) (HCC)   Hypokalemia   Paroxysmal atrial fibrillation with RVR (HCC)   Hypernatremia    ASSESSMENT AND PLAN: Susan Myers is a 73 y.o. female with a past medical history of paroxysmal atrial fibrillation on xarelto , hypertension, hypothyroidism, breast ca s/p lumpectomy & radiation on chronic hormone therapy who presented to the ED on 08/24/2024 for cough, SOB. Found to be in AF RVR. Cardiology was consulted for further evaluation.   # Atrial fibrillation RVR # Paroxysmal atrial fibrillation # Hypertension Patient presented with cough, SOB. Found to be in AF RVR. Started on amiodarone. S/p successful cardioversion yesterday however converted back into AF later in the afternoon.  -Continue PO amiodarone to 400 mg bid for 8 days, followed by 200 mg daily. Continue metoprolol succinate 75 mg bid. BP limiting further increases to metop. Continue digoxin 0.125 mg daily.  -Continue xarelto  20 mg daily.  -Continue Jardiance 10 mg daily and lasix 40 mg daily.  -Continue simvastatin  20 mg daily.   Ok for discharge today from a cardiac perspective. Will  arrange for follow up in clinic with Dr. Florencio in 1-2 weeks.  Scheduled for EP evaluation on 10/28.  This patient's case was discussed and created with Dr. Ammon and he is in agreement.  Signed:  Danita Bloch, PA-C  08/31/2024, 8:39 AM [facility] Cardiology

## 2024-08-31 NOTE — CV Procedure (Signed)
 Electrical Cardioversion Procedure Note   Procedure: Electrical Cardioversion Indications:  Atrial Fibrillation  Procedure Details Consent: Risks of procedure as well as the alternatives and risks of each were explained to the (patient/caregiver).  Consent for procedure obtained. Time Out: Verified patient identification, verified procedure, site/side was marked, verified correct patient position, special equipment/implants available, medications/allergies/relevent history reviewed, required imaging and test results available.  Performed  Patient placed on cardiac monitor, pulse oximetry, supplemental oxygen as necessary.  Sedation given: Anesthesia with propofol  Pacer pads placed anterior and posterior chest.  Cardioverted 1 time(s).  Cardioverted at 200J.  Evaluation Findings: Post procedure EKG shows: NSR Complications: None Patient did tolerate procedure well.   Cara Lovelace MD Cardiology 08/29/2024 12:35 PM

## 2024-08-31 NOTE — Plan of Care (Signed)

## 2024-08-31 NOTE — Progress Notes (Signed)
 Heart Failure Stewardship Pharmacy Note  PCP: Auston Reyes BIRCH, MD PCP-Cardiologist: None  HPI: Susan Myers is a 73 y.o. female with PAF on Xarelto , HTN, breast cancer status post lumpectomy radiation, on chronic hormonal manipulation, hypothyroidism who presented with dyspnea on exertion, orthopnea, and lower extremity edema. Started on diltiazem  recently outpatient for atrial fibrillation, after which symptoms worsened. On admission, BNP was 200, HS-troponin was 5, and TSH 4.732. ECG showed AF with RVR at 158 bpm. Chest x-ray noted central pulmonary vascular congestion and trace bilateral pleural effusions. Previously normal LVEF and no ischemia on stress test 01/2023. TTE this admission noted LVEF of 35-40%, G3DD, severe MR. Patient underwent DCCV yesterday, but reverted back to AF afterwards.   Pertinent Lab Values: Creatinine, Ser  Date Value Ref Range Status  08/31/2024 0.95 0.44 - 1.00 mg/dL Final   BUN  Date Value Ref Range Status  08/31/2024 39 (H) 8 - 23 mg/dL Final   Potassium  Date Value Ref Range Status  08/31/2024 4.1 3.5 - 5.1 mmol/L Final   Sodium  Date Value Ref Range Status  08/31/2024 141 135 - 145 mmol/L Final   B Natriuretic Peptide  Date Value Ref Range Status  08/24/2024 200.0 (H) 0.0 - 100.0 pg/mL Final    Comment:    Performed at [facility], [address] [place], [place] [address]   Magnesium   Date Value Ref Range Status  08/30/2024 2.6 (H) 1.7 - 2.4 mg/dL Final    Comment:    Performed at [facility], [address] [place], [place] [address]   Hgb A1c MFr Bld  Date Value Ref Range Status  11/23/2015 6.0 4.0 - 6.0 % Final   TSH  Date Value Ref Range Status  08/24/2024 2.947 0.350 - 4.500 uIU/mL Final    Comment:    Performed by a 3rd Generation assay with a functional sensitivity of <=0.01 uIU/mL. Performed at [facility], [address] [place], [place] [address]     Vital Signs:  Temp:  [97.3  F (36.3 C)-97.9 F (36.6 C)] 97.8 F (36.6 C) (10/08 0744) Pulse Rate:  [67-93] 93 (10/08 0744) Cardiac Rhythm: Atrial fibrillation (10/07 1914) Resp:  [16-17] 16 (10/08 0352) BP: (85-106)/(67-91) 106/91 (10/08 0744) SpO2:  [93 %-96 %] 96 % (10/08 0744)  Intake/Output Summary (Last 24 hours) at 08/31/2024 0758 Last data filed at 08/30/2024 1300 Gross per 24 hour  Intake 240 ml  Output --  Net 240 ml    Current Heart Failure Medications:  Loop diuretic: furosemide 40 mg PO daily Beta-Blocker: metoprolol succinate 75 mg BID ACEI/ARB/ARNI: none MRA: none SGLT2i: Jardiance 10 mg daily Other: digoxin 0.125 mg daily  Prior to admission Heart Failure Medications:  Loop diuretic: none Beta-Blocker: none ACEI/ARB/ARNI: none MRA: none SGLT2i: none Other: diltiazem  240 mg daily, hydrochlorothiazide  25 mg daily  Assessment: 1. Acute combined systolic and diastolic heart failure (LVEF 35-40%) with G3DD, due to unknown etiology. Complicated by AF RVR. NYHA class III symptoms.  -Symptoms: Reports shortness of breath is back to baseline. LEE improved.   -Volume: Appears to be euvolemic this morning. Creatinine is stable, but BUN is trending up. Would monitor and continue Furosemide 40 mg PO daily for now, but may require reduction to 20 mg daily if creatinine starts to increase too. -Hemodynamics: BP low. Rate is improved to <100 bpm for the majority of the time. -BB: Continue metoprolol succinate 75 mg BID.  -ACEI/ARB/ARNI: BP currently too soft for initiation,  can consider adding outpatient. -MRA: Consider adding when BP is stable ~110 mmHg. -SGLT2i: Continue Jardiance 10 mg daily. -Digoxin has significantly improved rates, however, patient is at high risk of accumulation given age and concomitant amiodarone. This should be monitored closely with digoxin level in 5-7 days from start.  Plan: 1) Medication changes recommended at this time: -Consider scheduling digoxin lab in 5-7 days  from initiation  2) Patient assistance: -Copay for Entresto, Farxiga, and Jardiance are $16.76  3) Education: - Patient has been educated on current HF medications and potential additions to HF medication regimen - Patient verbalizes understanding that over the next few months, these medication doses may change and more medications may be added to optimize HF regimen - Patient has been educated on basic disease state pathophysiology and goals of therapy  Medication Assistance / Insurance Benefits Check: Does the patient have prescription insurance?    Please do not hesitate to reach out with questions or concerns,  Jaun Bash, PharmD, CPP, BCPS, [facility] Heart Failure Pharmacist  Phone - [phone] 08/31/2024 7:58 AM

## 2024-08-31 NOTE — Discharge Summary (Signed)
 Physician Discharge Summary   Patient: Susan Myers MRN: [id number] DOB: 04-19-1951  Admit date:     08/24/2024  Discharge date: 08/31/24  Discharge Physician: Drue ONEIDA Potter   PCP: Auston Reyes BIRCH, MD   Recommendations at discharge:  Follow-up with cardiology  Discharge Diagnoses: Paroxysmal atrial fibrillation with RVR. Acute on chronic combined systolic and diastolic congestive heart failure. Severe mitral regurgitation. Severe tricuspid regurgitation. Hypokalemia. Chronic kidney disease stage II. Mild hypernatremia. Essential hypertension. Hypothyroidism. History of breast cancer.  Hospital Course:  Susan Myers is a 73 y.o. female with medical history significant of PAF on Xarelto , HTN, breast cancer status post lumpectomy radiation, on chronic hormonal manipulation, hypothyroidism, presented with worsening of cough shortness of breath and peripheral edema.  Upon arriving to hospital, she was found to have atrial fibrillation with RVR.  Amiodarone was started.  Patient was also given beta-blocker. Patient is seen by cardiology, echocardiogram showed ejection fraction of 35%.  Started on IV Lasix for acute exacerbation of congestive heart failure.  Underwent cardioversion however patient converted back into A-fib.  Cardiology recommends p.o. amiodarone as well as digoxin and metoprolol.  Rate is now controlled and have been cleared by cardiologist for discharge today and will follow-up as an outpatient   Consultants: Neurology Procedures performed: As mentioned above Disposition: Home Diet recommendation:  Cardiac diet DISCHARGE MEDICATION: Allergies as of 08/31/2024       Reactions   Levaquin [levofloxacin In D5w] Hives   Macrolides And Ketolides Other (See Comments)   Susan Myers   Penicillins Itching   Can take amoxicillin   Has patient had a PCN reaction causing immediate rash, facial/tongue/throat swelling, SOB or lightheadedness with  hypotension:No Has patient had a PCN reaction causing severe rash involving mucus membranes or skin necrosis:unsure Has patient had a PCN reaction that required hospitalization:No Has patient had a PCN reaction occurring within the last 10 years: No If all of the above answers are NO, then may proceed with Cephalosporin use.   Sulfa Antibiotics Hives        Medication List     STOP taking these medications    diltiazem  120 MG 24 hr capsule Commonly known as: Cardizem  CD   hydrochlorothiazide  25 MG tablet Commonly known as: HYDRODIURIL        TAKE these medications    acetaminophen  500 MG tablet Commonly known as: TYLENOL  Take 500 mg by mouth every 6 (six) hours as needed for moderate pain or headache.   alendronate  70 MG tablet Commonly known as: FOSAMAX  TAKE 1 TABLET(70 MG) BY MOUTH 1 TIME A WEEK WITH A FULL GLASS OF WATER AND ON AN EMPTY STOMACH   amiodarone 200 MG tablet Commonly known as: PACERONE Take 2 tablets (400 mg total) by mouth 2 (two) times daily for 8 days, THEN 1 tablet (200 mg total) daily. Start taking on: August 31, 2024 What changed: See the new instructions.   benzonatate  200 MG capsule Commonly known as: TESSALON  Take 1 capsule (200 mg total) by mouth 3 (three) times daily as needed for Cough   desvenlafaxine 25 MG 24 hr tablet Commonly known as: PRISTIQ Take 25 mg by mouth daily.   digoxin 0.125 MG tablet Commonly known as: LANOXIN Take 1 tablet (0.125 mg total) by mouth daily. Start taking on: September 01, 2024   docusate sodium  100 MG capsule Commonly known as: COLACE Take 1 capsule (100 mg total) by mouth 2 (two) times daily.   empagliflozin 10 MG Tabs tablet Commonly  known as: JARDIANCE Take 1 tablet (10 mg total) by mouth daily. Start taking on: September 01, 2024   furosemide 40 MG tablet Commonly known as: LASIX Take 1 tablet (40 mg total) by mouth daily. Start taking on: September 01, 2024   levothyroxine  125 MCG  tablet Commonly known as: SYNTHROID  Take 125 mcg by mouth daily.   meloxicam  7.5 MG tablet Commonly known as: MOBIC  Take 7.5 mg by mouth daily as needed for pain.   metoprolol succinate 25 MG 24 hr tablet Commonly known as: TOPROL-XL Take 3 tablets (75 mg total) by mouth 2 (two) times daily.   omeprazole 40 MG capsule Commonly known as: PRILOSEC Take 40 mg by mouth daily.   phenylephrine  10 MG Tabs tablet Commonly known as: SUDAFED PE Take 30 mg by mouth every 6 (six) hours as needed (congestion).   risperiDONE  1 MG tablet Commonly known as: RISPERDAL  Take 1 mg by mouth at bedtime.   rivaroxaban  20 MG Tabs tablet Commonly known as: XARELTO  Take 20 mg by mouth daily.   simvastatin  20 MG tablet Commonly known as: ZOCOR  Take 20 mg by mouth every evening.   VITAMIN D PO Take 1 tablet by mouth once a week.        Follow-up Information     [facility] Cardiology Electrophysiology. Go in 3 week(s).   Why: Appointment scheduled for 09/20/24 at 10:40 AM Contact information: [address] [place], [place] [address]        Florencio Cara BIRCH, MD. Go in 1 week(s).   Specialties: Cardiology, Internal Medicine Why: Appointment scheduled for 10/13 at 2 PM Contact information: [address] [place] [address] [phone]                Discharge Exam:  General exam: Appears calm and comfortable  Respiratory system: Clear to auscultation. Respiratory effort normal. Cardiovascular system: Irregularly irregular, tachycardic. No JVD, murmurs, rubs, gallops or clicks. No pedal edema. Gastrointestinal system: Abdomen is nondistended, soft and nontender. No organomegaly or masses felt. Normal bowel sounds heard. Central nervous system: Alert and oriented. No focal neurological deficits. Extremities: Symmetric 5 x 5 power. Skin: No rashes, lesions or ulcers Psychiatry: Judgement and insight appear normal. Mood & affect appropriate.     Condition  at discharge: good  The results of significant diagnostics from this hospitalization (including imaging, microbiology, ancillary and laboratory) are listed below for reference.   Imaging Studies: CARDIAC CATHETERIZATION Result Date: 08/28/2024   The left ventricular systolic function is normal.   LV end diastolic pressure is normal.   The left ventricular ejection fraction is 55-65% by visual estimate.   No indication for antiplatelet therapy at this time . Conclusion Inpatient diagnostic cardiac cath anginal symptoms systolic cardiomyopathy Right radial approach left heart cath Left ventriculogram with significant ectopy but appeared to be normal left ventricular function of 55 to 60% normal LV chamber size Coronary Left main large free of disease LAD large free of disease Circumflex large disease-free RCA large disease-free Patient tolerated procedure well No complication Resume long-term anticoagulation 4 hours after TR band removal   ECHOCARDIOGRAM COMPLETE Result Date: 08/25/2024    ECHOCARDIOGRAM REPORT   Patient Name:   Susan Myers Date of Exam: 08/25/2024 Medical Rec #:  [id number]          Height:       63.0 in Accession #:    [id number]         Weight:  200.0 lb Date of Birth:  02/02/1951         BSA:          1.934 m Patient Age:    72 years           BP:           117/72 mmHg Patient Gender: F                  HR:           79 bpm. Exam Location:  [facility] Procedure: 2D Echo, Cardiac Doppler, Color Doppler and Intracardiac            Opacification Agent (Both Spectral and Color Flow Doppler were            utilized during procedure). REPORT CONTAINS CRITICAL RESULT Indications:     CHF-Acute Diastolic I50.31  History:         Patient has no prior history of Echocardiogram examinations.                  CHF; Arrythmias:Atrial Fibrillation.  Sonographer:     Rosina Dunk Referring Phys:  [id number] CORT ONEIDA MANA Diagnosing Phys: Annalee Custovic IMPRESSIONS  1. Left ventricular ejection  fraction, by estimation, is 35 to 40%. Left ventricular ejection fraction by 2D MOD biplane is 37.2 %. The left ventricle has moderately decreased function. The left ventricle demonstrates regional wall motion abnormalities (see scoring diagram/findings for description). Left ventricular diastolic parameters are consistent with Grade III diastolic dysfunction (restrictive).  2. Right ventricular systolic function is normal. The right ventricular size is normal.  3. Left atrial size was mildly dilated.  4. The mitral valve is abnormal. Severe mitral valve regurgitation. No evidence of mitral stenosis.  5. The tricuspid valve is abnormal. Tricuspid valve regurgitation is severe.  6. The aortic valve is normal in structure. Aortic valve regurgitation is not visualized. No aortic stenosis is present.  7. The inferior vena cava is normal in size with greater than 50% respiratory variability, suggesting right atrial pressure of 3 mmHg. FINDINGS  Left Ventricle: Left ventricular ejection fraction, by estimation, is 35 to 40%. Left ventricular ejection fraction by 2D MOD biplane is 37.2 %. The left ventricle has moderately decreased function. The left ventricle demonstrates regional wall motion abnormalities. Definity contrast agent was given IV to delineate the left ventricular endocardial borders. The left ventricular internal cavity size was normal in size. There is no left ventricular hypertrophy. Left ventricular diastolic parameters are consistent with Grade III diastolic dysfunction (restrictive).  LV Wall Scoring: The inferior septum is hypokinetic. Right Ventricle: The right ventricular size is normal. No increase in right ventricular wall thickness. Right ventricular systolic function is normal. Left Atrium: Left atrial size was mildly dilated. Right Atrium: Right atrial size was normal in size. Pericardium: There is no evidence of pericardial effusion. Mitral Valve: The mitral valve is abnormal. Severe mitral  valve regurgitation. No evidence of mitral valve stenosis. MV peak gradient, 9.6 mmHg. The mean mitral valve gradient is 4.0 mmHg. Tricuspid Valve: The tricuspid valve is abnormal. Tricuspid valve regurgitation is severe. Aortic Valve: The aortic valve is normal in structure. Aortic valve regurgitation is not visualized. No aortic stenosis is present. Aortic valve mean gradient measures 3.0 mmHg. Aortic valve peak gradient measures 4.9 mmHg. Aortic valve area, by VTI measures 2.10 cm. Pulmonic Valve: The pulmonic valve was normal in structure. Pulmonic valve regurgitation is not visualized. Aorta: The aortic root is normal in size  and structure. Venous: The inferior vena cava is normal in size with greater than 50% respiratory variability, suggesting right atrial pressure of 3 mmHg. IAS/Shunts: No atrial level shunt detected by color flow Doppler.  LEFT VENTRICLE PLAX 2D                        Biplane EF (MOD) LVIDd:         3.90 cm         LV Biplane EF:   Left LVIDs:         3.30 cm                          ventricular LV PW:         1.10 cm                          ejection LV IVS:        1.20 cm                          fraction by LVOT diam:     1.70 cm                          2D MOD LV SV:         37                               biplane is LV SV Index:   19                               37.2 %. LVOT Area:     2.27 cm                                Diastology                                LV e' medial:    7.94 cm/s LV Volumes (MOD)               LV E/e' medial:  20.3 LV vol d, MOD    99.0 ml       LV e' lateral:   9.46 cm/s A2C:                           LV E/e' lateral: 17.0 LV vol d, MOD    75.4 ml A4C: LV vol s, MOD    54.8 ml A2C: LV vol s, MOD    49.8 ml A4C: LV SV MOD A2C:   44.2 ml LV SV MOD A4C:   75.4 ml LV SV MOD BP:    32.5 ml RIGHT VENTRICLE RV Basal diam:  4.20 cm RV Mid diam:    2.90 cm RV S prime:     12.60 cm/s TAPSE (M-mode): 2.1 cm LEFT ATRIUM             Index        RIGHT ATRIUM            Index LA diam:  3.10 cm 1.60 cm/m   RA Area:     14.80 cm LA Vol (A2C):   68.9 ml 35.63 ml/m  RA Volume:   35.80 ml  18.51 ml/m LA Vol (A4C):   39.4 ml 20.38 ml/m LA Biplane Vol: 54.3 ml 28.08 ml/m  AORTIC VALVE                    PULMONIC VALVE AV Area (Vmax):    2.11 cm     PV Vmax:        0.64 m/s AV Area (Vmean):   1.92 cm     PV Vmean:       44.900 cm/s AV Area (VTI):     2.10 cm     PV VTI:         0.093 m AV Vmax:           111.00 cm/s  PV Peak grad:   1.6 mmHg AV Vmean:          77.000 cm/s  PV Mean grad:   1.0 mmHg AV VTI:            0.177 m      RVOT Peak grad: 2 mmHg AV Peak Grad:      4.9 mmHg AV Mean Grad:      3.0 mmHg LVOT Vmax:         103.00 cm/s LVOT Vmean:        65.300 cm/s LVOT VTI:          0.164 m LVOT/AV VTI ratio: 0.93  AORTA Ao Root diam: 2.50 cm Ao Asc diam:  2.90 cm MITRAL VALVE                  TRICUSPID VALVE MV Area (PHT): 5.66 cm       TR Peak grad:   29.8 mmHg MV Area VTI:   1.15 cm       TR Mean grad:   22.0 mmHg MV Peak grad:  9.6 mmHg       TR Vmax:        273.00 cm/s MV Mean grad:  4.0 mmHg       TR Vmean:       225.0 cm/s MV Vmax:       1.55 m/s MV Vmean:      94.2 cm/s      SHUNTS MV Decel Time: 134 msec       Systemic VTI:  0.16 m MR Peak grad:    86.1 mmHg    Systemic Diam: 1.70 cm MR Mean grad:    56.0 mmHg    Pulmonic VTI:  0.131 m MR Vmax:         464.00 cm/s MR Vmean:        354.0 cm/s MR PISA:         6.28 cm MR PISA Eff ROA: 52 mm MR PISA Radius:  1.00 cm MV E velocity: 161.00 cm/s MV A velocity: 57.00 cm/s MV E/A ratio:  2.82 Sabina Custovic Electronically signed by Annalee Casa Signature Date/Time: 08/25/2024/12:02:38 PM    Final    DG Chest 1 View Result Date: 08/25/2024 CLINICAL DATA:  02706 CHF (congestive heart failure) (HCC) [address]2706 EXAM: CHEST  1 VIEW COMPARISON:  08/24/2024 FINDINGS: Central pulmonary vascular congestion. No focal airspace consolidation or pneumothorax. Trace bilateral pleural effusions. Mild cardiomegaly. Aortic  atherosclerosis. No acute fracture or destructive lesions. Multilevel thoracic osteophytosis. IMPRESSION: Mild cardiomegaly with central pulmonary vascular  congestion. Trace bilateral pleural effusions. Electronically Signed   By: Rogelia Myers M.D.   On: 08/25/2024 07:28   DG Chest 2 View Result Date: 08/24/2024 CLINICAL DATA:  73 year old female with shortness of breath. EXAM: CHEST - 2 VIEW COMPARISON:  CTA chest 08/02/2024 and earlier. FINDINGS: PA and lateral views 0714 hours. Small pleural effusions on CTA earlier this month persist. Slightly lower lung volume since that time. Stable cardiac size and mediastinal contours. Nonspecific symmetric pulmonary interstitial opacity has not significantly changed over this series of exams. No pneumothorax. No consolidation or confluent opacity. Osteopenia. No acute osseous abnormality identified. IVC filter partially visible, obliquely oriented. Negative visible bowel gas. IMPRESSION: 1. Unresolved small pleural effusions and lower lung volumes from radiographs and CTA earlier this month. Nonspecific symmetric increased interstitium not significantly changed. Considerations include chronic interstitial disease, interstitial edema, and less likely viral/atypical respiratory infection. 2. No new cardiopulmonary abnormality.  IVC filter visible. Electronically Signed   By: VEAR Hurst M.D.   On: 08/24/2024 07:47   CT Angio Chest PE W and/or Wo Contrast Result Date: 08/02/2024 CLINICAL DATA:  Tachycardia, abnormal EKG EXAM: CT ANGIOGRAPHY CHEST WITH CONTRAST TECHNIQUE: Multidetector CT imaging of the chest was performed using the standard protocol during bolus administration of intravenous contrast. Multiplanar CT image reconstructions and MIPs were obtained to evaluate the vascular anatomy. RADIATION DOSE REDUCTION: This exam was performed according to the departmental dose-optimization program which includes automated exposure control, adjustment of the mA and/or kV  according to patient size and/or use of iterative reconstruction technique. CONTRAST:  75mL OMNIPAQUE  IOHEXOL  350 MG/ML SOLN COMPARISON:  08/02/2024 FINDINGS: Cardiovascular: This is a technically adequate evaluation of the pulmonary vasculature. No filling defects or pulmonary emboli. The heart is unremarkable without pericardial effusion. Normal caliber of the thoracic aorta. Atherosclerosis of the aortic arch. Coronary artery atherosclerosis greatest in the LAD and right coronary distributions. Mediastinum/Nodes: No enlarged mediastinal, hilar, or axillary lymph nodes. Thyroid  gland, trachea, and esophagus demonstrate no significant findings. Lungs/Pleura: Trace bilateral pleural effusions. Minimal bibasilar hypoventilatory changes. No airspace disease or pneumothorax. Central airways are patent. Upper Abdomen: 1.3 cm left adrenal nodule measures 2 HU, compatible with benign adrenal adenoma. Nonobstructing 7 mm calculus upper pole left kidney. Musculoskeletal: No acute or destructive bony abnormalities. Reconstructed images demonstrate no additional findings. Review of the MIP images confirms the above findings. IMPRESSION: 1. No evidence of pulmonary embolus. 2. Trace free-flowing bilateral pleural effusions. 3. Nonobstructing 7 mm left renal calculus. 4. Left adrenal mass measuring 1.3 cm, consistent with lipid-rich benign adenoma. No follow-up imaging is recommended. JACR 2017 Aug; 14(8):1038-44, JCAT 2016 Mar-Apr; 40(2):194-200, Urol J 2006 Spring; 3(2):71-4. 5. Aortic Atherosclerosis (ICD10-I70.0). Coronary artery atherosclerosis. Electronically Signed   By: Ozell Daring M.D.   On: 08/02/2024 17:46   DG Chest 2 View Result Date: 08/02/2024 CLINICAL DATA:  Elevated heart rate with an abnormal EKG. EXAM: CHEST - 2 VIEW COMPARISON:  August 02, 2024 FINDINGS: The heart size and mediastinal contours are within normal limits. Mild, chronic appearing increased interstitial lung markings are seen. Very small,  stable bilateral pleural effusions are seen. There is no evidence of focal consolidation or pneumothorax. An IVC filter is seen within the visualized portion of the abdomen. The visualized skeletal structures are unremarkable. IMPRESSION: 1. Chronic appearing increased interstitial lung markings. 2. Very small, stable bilateral pleural effusions. Electronically Signed   By: Suzen Dials M.D.   On: 08/02/2024 15:47   DG Chest 2 View Result Date: 08/02/2024  EXAM: 2 VIEW(S) XRAY OF THE CHEST 08/02/2024 01:33:34 PM COMPARISON: None available. CLINICAL HISTORY: Cough \\T \ wheezing x1 mon. Pt c/o cough \\T \ wheezing x4 wks. Denies any hx of asthma or COPD. Was given tessalon  by PCP on 9/4. FINDINGS: LUNGS AND PLEURA: Pulmonary interstitial prominence compatible with mild pulmonary edema. Trace bilateral pleural effusions. No pneumothorax. HEART AND MEDIASTINUM: No acute abnormality of the cardiac and mediastinal silhouettes. BONES AND SOFT TISSUES: No acute osseous abnormality. IMPRESSION: 1. Pulmonary interstitial prominence with mild pulmonary edema. 2. Trace bilateral pleural effusions. Electronically signed by: Waddell Calk MD 08/02/2024 02:03 PM EDT RP Workstation: [id number]    Microbiology: Results for orders placed or performed in visit on 06/10/17  Microscopic Examination     Status: Abnormal   Collection Time: 06/10/17 11:56 AM   URINE  Result Value Ref Range Status   WBC, UA 0-5 0 - 5 /hpf Final   RBC, UA 0-2 0 - 2 /hpf Final   Epithelial Cells (non renal) >10 (A) 0 - 10 /hpf Final   Bacteria, UA Moderate (A) None seen/Few Final    Labs: CBC: Recent Labs  Lab 08/26/24 0426 08/31/24 0546  WBC 9.3 8.0  HGB 13.0 13.7  HCT 39.4 41.3  MCV 94.7 94.1  PLT 188 173   Basic Metabolic Panel: Recent Labs  Lab 08/26/24 0426 08/28/24 0426 08/29/24 0538 08/30/24 0032 08/30/24 0425 08/31/24 0546  NA 142 145 146* 142 143 141  K 3.6 4.0 3.8 3.6 4.0 4.1  CL 104 105 106 107 108 104  CO2  26 24 25 27 27 26   GLUCOSE 115* 97 106* 114* 114* 111*  BUN 21 35* 36* 36* 36* 39*  CREATININE 1.07* 1.04* 1.01* 0.98 1.05* 0.95  CALCIUM 9.1 9.1 8.7* 8.5* 8.9 8.8*  MG 2.3 2.5* 2.4 2.6*  --   --    Liver Function Tests: No results for input(s): AST, ALT, ALKPHOS, BILITOT, PROT, ALBUMIN in the last 168 hours. CBG: No results for input(s): GLUCAP in the last 168 hours.  Discharge time spent: 39 minutes.  Signed: Drue ONEIDA Potter, MD [facility] 08/31/2024

## 2024-09-05 DIAGNOSIS — I872 Venous insufficiency (chronic) (peripheral): Secondary | ICD-10-CM | POA: Diagnosis not present

## 2024-09-05 DIAGNOSIS — I4891 Unspecified atrial fibrillation: Secondary | ICD-10-CM | POA: Diagnosis not present

## 2024-09-05 DIAGNOSIS — R000 Tachycardia, unspecified: Secondary | ICD-10-CM | POA: Diagnosis not present

## 2024-09-05 DIAGNOSIS — R7303 Prediabetes: Secondary | ICD-10-CM | POA: Diagnosis not present

## 2024-09-05 DIAGNOSIS — E66812 Obesity, class 2: Secondary | ICD-10-CM | POA: Diagnosis not present

## 2024-09-05 DIAGNOSIS — I10 Essential (primary) hypertension: Secondary | ICD-10-CM | POA: Diagnosis not present

## 2024-09-05 DIAGNOSIS — K219 Gastro-esophageal reflux disease without esophagitis: Secondary | ICD-10-CM | POA: Diagnosis not present

## 2024-09-05 DIAGNOSIS — I890 Lymphedema, not elsewhere classified: Secondary | ICD-10-CM | POA: Diagnosis not present

## 2024-09-05 DIAGNOSIS — E7800 Pure hypercholesterolemia, unspecified: Secondary | ICD-10-CM | POA: Diagnosis not present

## 2024-09-14 DIAGNOSIS — Z135 Encounter for screening for eye and ear disorders: Secondary | ICD-10-CM | POA: Diagnosis not present

## 2024-09-14 DIAGNOSIS — H5203 Hypermetropia, bilateral: Secondary | ICD-10-CM | POA: Diagnosis not present

## 2024-09-14 DIAGNOSIS — H2513 Age-related nuclear cataract, bilateral: Secondary | ICD-10-CM | POA: Diagnosis not present

## 2024-09-14 DIAGNOSIS — H52223 Regular astigmatism, bilateral: Secondary | ICD-10-CM | POA: Diagnosis not present

## 2024-09-14 DIAGNOSIS — H01004 Unspecified blepharitis left upper eyelid: Secondary | ICD-10-CM | POA: Diagnosis not present

## 2024-09-14 DIAGNOSIS — H01001 Unspecified blepharitis right upper eyelid: Secondary | ICD-10-CM | POA: Diagnosis not present

## 2024-09-14 DIAGNOSIS — H11003 Unspecified pterygium of eye, bilateral: Secondary | ICD-10-CM | POA: Diagnosis not present

## 2024-09-14 DIAGNOSIS — D3131 Benign neoplasm of right choroid: Secondary | ICD-10-CM | POA: Diagnosis not present

## 2024-09-14 DIAGNOSIS — H524 Presbyopia: Secondary | ICD-10-CM | POA: Diagnosis not present

## 2024-09-14 DIAGNOSIS — H04123 Dry eye syndrome of bilateral lacrimal glands: Secondary | ICD-10-CM | POA: Diagnosis not present

## 2024-09-15 ENCOUNTER — Encounter: Admission: RE | Disposition: A | Payer: Self-pay | Source: Home / Self Care | Attending: Cardiovascular Disease

## 2024-09-15 ENCOUNTER — Ambulatory Visit
Admission: RE | Admit: 2024-09-15 | Discharge: 2024-09-15 | Disposition: A | Discharge status: Home / Self Care | Attending: Cardiovascular Disease | Admitting: Cardiovascular Disease

## 2024-09-15 ENCOUNTER — Ambulatory Visit: Admitting: General Practice

## 2024-09-15 ENCOUNTER — Encounter: Payer: Self-pay | Admitting: Cardiovascular Disease

## 2024-09-15 ENCOUNTER — Ambulatory Visit
Admission: RE | Admit: 2024-09-15 | Discharge: 2024-09-15 | Disposition: A | Discharge status: Home / Self Care | Source: Home / Self Care | Attending: Student | Admitting: Student

## 2024-09-15 DIAGNOSIS — E039 Hypothyroidism, unspecified: Secondary | ICD-10-CM | POA: Insufficient documentation

## 2024-09-15 DIAGNOSIS — I110 Hypertensive heart disease with heart failure: Secondary | ICD-10-CM | POA: Insufficient documentation

## 2024-09-15 DIAGNOSIS — I5022 Chronic systolic (congestive) heart failure: Secondary | ICD-10-CM | POA: Insufficient documentation

## 2024-09-15 DIAGNOSIS — I081 Rheumatic disorders of both mitral and tricuspid valves: Secondary | ICD-10-CM | POA: Diagnosis not present

## 2024-09-15 DIAGNOSIS — K219 Gastro-esophageal reflux disease without esophagitis: Secondary | ICD-10-CM | POA: Insufficient documentation

## 2024-09-15 DIAGNOSIS — Z79890 Hormone replacement therapy (postmenopausal): Secondary | ICD-10-CM | POA: Insufficient documentation

## 2024-09-15 DIAGNOSIS — I4891 Unspecified atrial fibrillation: Secondary | ICD-10-CM | POA: Diagnosis not present

## 2024-09-15 DIAGNOSIS — Z79899 Other long term (current) drug therapy: Secondary | ICD-10-CM | POA: Diagnosis not present

## 2024-09-15 DIAGNOSIS — I340 Nonrheumatic mitral (valve) insufficiency: Secondary | ICD-10-CM

## 2024-09-15 DIAGNOSIS — I10 Essential (primary) hypertension: Secondary | ICD-10-CM | POA: Diagnosis not present

## 2024-09-15 DIAGNOSIS — Q2112 Patent foramen ovale: Secondary | ICD-10-CM | POA: Insufficient documentation

## 2024-09-15 HISTORY — PX: TEE WITHOUT CARDIOVERSION: SHX5443

## 2024-09-15 LAB — ECHO TEE
MV M vel: 5.17 m/s
MV Peak grad: 106.9 mmHg
Radius: 0.8 cm

## 2024-09-15 SURGERY — ECHOCARDIOGRAM, TRANSESOPHAGEAL
Anesthesia: General

## 2024-09-15 MED ORDER — PROPOFOL 10 MG/ML IV BOLUS
INTRAVENOUS | Status: DC | PRN
  Administered 2024-09-15 (×5): 20 mg via INTRAVENOUS
  Administered 2024-09-15: 40 mg via INTRAVENOUS
  Administered 2024-09-15 (×3): 20 mg via INTRAVENOUS

## 2024-09-15 MED ORDER — BUTAMBEN-TETRACAINE-BENZOCAINE 2-2-14 % EX AERO
INHALATION_SPRAY | CUTANEOUS | Status: AC
  Filled 2024-09-15: qty 5

## 2024-09-15 MED ORDER — SODIUM CHLORIDE 0.9 % IV SOLN: INTRAVENOUS | Status: DC

## 2024-09-15 MED ORDER — LIDOCAINE HCL (PF) 2 % IJ SOLN
INTRAMUSCULAR | Status: DC | PRN
  Administered 2024-09-15: 20 mg via INTRADERMAL

## 2024-09-15 MED ORDER — PROPOFOL 1000 MG/100ML IV EMUL
INTRAVENOUS | Status: AC
  Filled 2024-09-15: qty 100

## 2024-09-15 MED ORDER — LIDOCAINE VISCOUS HCL 2 % MT SOLN
OROMUCOSAL | Status: AC
Start: 2024-09-15 — End: 2024-09-15
  Filled 2024-09-15: qty 15

## 2024-09-15 NOTE — Transfer of Care (Signed)
 Immediate Anesthesia Transfer of Care Note  Patient: Susan Myers  Procedure(s) Performed: ECHOCARDIOGRAM, TRANSESOPHAGEAL  Patient Location: [facility]  Anesthesia Type:MAC  Level of Consciousness: awake and alert   Airway & Oxygen Therapy: Patient Spontanous Breathing and Patient connected to nasal cannula oxygen  Post-op Assessment: Report given to RN and Post -op Vital signs reviewed and stable  Post vital signs: Reviewed and stable  Last Vitals:  Vitals Value Taken Time  BP 103/76   Temp    Pulse 95   Resp 18   SpO2 95     Last Pain:  Vitals:   09/15/24 1108  PainSc: 0-No pain         Complications: No notable events documented.

## 2024-09-15 NOTE — H&P (Signed)
 Pre-procedure History & Physical    Patient ID: Susan Myers MRN: [id number]; DOB: 02-13-1951   Date of procedure: 09/15/2024  Primary Care Provider: Auston Reyes BIRCH, MD Primary Cardiologist: None   Planned procedure:  TEE  HPI:   Susan Myers is a 73 y.o. female with HFrEF, MR, TR here for TEE to evaluate valvular heart disease  Past Medical History:  Diagnosis Date   Actinic keratosis 01/10/2019   mid vertex scalp   Breast cancer (HCC) 2019   invasive mammary carcinoma, lumpectomy and rad tx   Complication of anesthesia    Delusional disorder (HCC)    DVT (deep venous thrombosis) (HCC) 06/2016   Dysplastic nevus 07/24/2020   R med calf - mod   GERD (gastroesophageal reflux disease)    History of kidney stones    Hx of blood clots    Hypercholesterolemia    Hypertension    Hypothyroidism    Kidney stones    Obesity    Personal history of radiation therapy 2019   right breast cancer   PONV (postoperative nausea and vomiting)    Prediabetes    Thyroid  disease     Past Surgical History:  Procedure Laterality Date   ABDOMINAL HYSTERECTOMY     APPENDECTOMY     BREAST BIOPSY Right 03/23/2018   Affirm Bx- invasive mammary carcinoma   BREAST CYST ASPIRATION Bilateral 1990   BREAST LUMPECTOMY Right 2019   invasive mammary carcinoma, clear surgical margins   CARDIOVERSION N/A 08/29/2024   Procedure: CARDIOVERSION;  Surgeon: Florencio Cara BIRCH, MD;  Location: [facility];  Service: Cardiovascular;  Laterality: N/A;   COLONOSCOPY     IVC FILTER INSERTION     LEFT HEART CATH AND CORONARY ANGIOGRAPHY N/A 08/26/2024   Procedure: LEFT HEART CATH AND CORONARY ANGIOGRAPHY with coronaries and possible PCI and stent;  Surgeon: Florencio Cara BIRCH, MD;  Location: [facility];  Service: Cardiovascular;  Laterality: N/A;   PARTIAL MASTECTOMY WITH NEEDLE LOCALIZATION Right 04/01/2018   Procedure: PARTIAL MASTECTOMY WITH NEEDLE LOCALIZATION;  Surgeon: Rodolph Romano, MD;  Location: [facility];  Service: General;  Laterality: Right;   PERIPHERAL VASCULAR CATHETERIZATION N/A 08/06/2016   Procedure: IVC Filter Insertion;  Surgeon: Cordella KANDICE Shawl, MD;  Location: [facility];  Service: Cardiovascular;  Laterality: N/A;   PERIPHERAL VASCULAR CATHETERIZATION Left 08/06/2016   Procedure: Lower Extremity Venography;  Surgeon: Cordella KANDICE Shawl, MD;  Location: [facility];  Service: Cardiovascular;  Laterality: Left;   PERIPHERAL VASCULAR CATHETERIZATION  08/06/2016   Procedure: Lower Extremity Intervention;  Surgeon: Cordella KANDICE Shawl, MD;  Location: [facility];  Service: Cardiovascular;;   PERIPHERAL VASCULAR CATHETERIZATION N/A 12/16/2016   Procedure: IVC Filter Removal;  Surgeon: Cordella KANDICE Shawl, MD;  Location: [facility];  Service: Cardiovascular;  Laterality: N/A;   SENTINEL NODE BIOPSY Right 04/01/2018   Procedure: SENTINEL NODE BIOPSY;  Surgeon: Rodolph Romano, MD;  Location: [facility];  Service: General;  Laterality: Right;   TONSILLECTOMY       Medications Prior to Admission: Prior to Admission medications   Medication Sig Start Date End Date Taking? Authorizing Provider  acetaminophen  (TYLENOL ) 500 MG tablet Take 500 mg by mouth every 6 (six) hours as needed for moderate pain or headache.    [provider]  alendronate  (FOSAMAX ) 70 MG tablet TAKE 1 TABLET(70 MG) BY MOUTH 1 TIME A WEEK WITH A FULL GLASS OF WATER AND ON AN EMPTY STOMACH  04/01/23   Jacobo Evalene PARAS, MD  amiodarone (PACERONE) 200 MG tablet Take 2 tablets (400 mg total) by mouth 2 (two) times daily for 8 days, THEN 1 tablet (200 mg total) daily. 08/31/24 10/08/24  Dorinda Drue DASEN, MD  benzonatate  (TESSALON ) 200 MG capsule Take 1 capsule (200 mg total) by mouth 3 (three) times daily as needed for Cough 07/26/24   [provider]  Cholecalciferol (VITAMIN D PO) Take 1 tablet by mouth once a week.    [provider]   desvenlafaxine (PRISTIQ) 25 MG 24 hr tablet Take 25 mg by mouth daily. 07/23/24   [provider]  digoxin (LANOXIN) 0.125 MG tablet Take 1 tablet (0.125 mg total) by mouth daily. 09/01/24   Dorinda Drue DASEN, MD  diltiazem  (CARDIZEM  CD) 240 MG 24 hr capsule Take 240 mg by mouth daily. 08/22/24   [provider]  docusate sodium  (COLACE) 100 MG capsule Take 1 capsule (100 mg total) by mouth 2 (two) times daily. 08/31/24   Dorinda Drue DASEN, MD  empagliflozin (JARDIANCE) 10 MG TABS tablet Take 1 tablet (10 mg total) by mouth daily. 09/01/24   Dorinda Drue DASEN, MD  furosemide (LASIX) 40 MG tablet Take 1 tablet (40 mg total) by mouth daily. 09/01/24   Dorinda Drue DASEN, MD  levothyroxine  (SYNTHROID ) 125 MCG tablet Take 125 mcg by mouth daily. 08/19/21   [provider]  meloxicam  (MOBIC ) 7.5 MG tablet Take 7.5 mg by mouth daily as needed for pain.    [provider]  metoprolol succinate (TOPROL-XL) 25 MG 24 hr tablet Take 3 tablets (75 mg total) by mouth 2 (two) times daily. 08/31/24   Dorinda Drue DASEN, MD  omeprazole (PRILOSEC) 40 MG capsule Take 40 mg by mouth daily. 08/22/24 08/22/25  [provider]  phenylephrine  (SUDAFED PE) 10 MG TABS tablet Take 30 mg by mouth every 6 (six) hours as needed (congestion).    [provider]  risperiDONE  (RISPERDAL ) 1 MG tablet Take 1 mg by mouth at bedtime.    [provider]  rivaroxaban  (XARELTO ) 20 MG TABS tablet Take 20 mg by mouth daily.    [provider]  simvastatin  (ZOCOR ) 20 MG tablet Take 20 mg by mouth every evening.  12/04/15   [provider]     Allergies:    Allergies  Allergen Reactions   Levaquin [Levofloxacin In D5w] Hives   Macrolides And Ketolides Other (See Comments)    WHELPS WHELPS WHELPS   Penicillins Itching    Can take amoxicillin   Has patient had a PCN reaction causing immediate rash, facial/tongue/throat swelling, SOB or lightheadedness with hypotension:No Has  patient had a PCN reaction causing severe rash involving mucus membranes or skin necrosis:unsure Has patient had a PCN reaction that required hospitalization:No Has patient had a PCN reaction occurring within the last 10 years: No If all of the above answers are NO, then may proceed with Cephalosporin use.   Sulfa Antibiotics Hives    Social History:   Social History   Socioeconomic History   Marital status: Married    Spouse name: Not on file   Number of children: Not on file   Years of education: Not on file   Highest education level: Not on file  Occupational History   Not on file  Tobacco Use   Smoking status: Never   Smokeless tobacco: Never  Vaping Use   Vaping status: Never Used  Substance and Sexual Activity   Alcohol use:  No   Drug use: No   Sexual activity: Not Currently  Other Topics Concern   Not on file  Social History Narrative   Not on file   Social Drivers of Health   Financial Resource Strain: Low Risk  (12/25/2023)   Received from [facility]   Overall Financial Resource Strain (CARDIA)    Difficulty of Paying Living Expenses: Not hard at all  Food Insecurity: No Food Insecurity (08/24/2024)   Hunger Vital Sign    Worried About Running Out of Food in the Last Year: Never true    Ran Out of Food in the Last Year: Never true  Transportation Needs: No Transportation Needs (08/24/2024)   PRAPARE - Administrator, Civil Service (Medical): No    Lack of Transportation (Non-Medical): No  Physical Activity: Not on file  Stress: Not on file  Social Connections: Socially Integrated (08/24/2024)   Social Connection and Isolation Panel    Frequency of Communication with Friends and Family: More than three times a week    Frequency of Social Gatherings with Friends and Family: More than three times a week    Attends Religious Services: More than 4 times per year    Active Member of [employer] or Organizations: Yes    Attends Museum/gallery exhibitions officer: More than 4 times per year    Marital Status: Married  Catering manager Violence: Not At Risk (08/24/2024)   Humiliation, Afraid, Rape, and Kick questionnaire    Fear of Current or Ex-Partner: No    Emotionally Abused: No    Physically Abused: No    Sexually Abused: No     Family History:   The patient's family history is negative for Breast cancer, Prostate cancer, and Kidney cancer.    ROS:  Please see the history of present illness.  All other ROS reviewed and negative.     Physical Exam/Data:   Vitals:   09/15/24 1108  BP: 125/81  Pulse: 91  Resp: 20  SpO2: 97%  Weight: 86.2 kg  Height: 5' 3 (1.6 m)   No intake or output data in the 24 hours ending 09/15/24 1117    09/15/2024   11:08 AM 08/02/2024    3:05 PM 08/02/2024    1:08 PM  Last 3 Weights  Weight (lbs) 190 lb 200 lb 200 lb  Weight (kg) 86.183 kg 90.719 kg 90.719 kg     Body mass index is 33.66 kg/m.  Wt Readings from Last 3 Encounters:  09/15/24 86.2 kg  08/02/24 90.7 kg  08/02/24 90.7 kg    Physical Exam: General: no acute distress. Head: Normocephalic, atraumatic  Neck: supple Lungs: nl effort Heart: regular rate/rhythm, +systolic murmur Abdomen: Soft Msk:  Strength and tone appear normal for age. Extremities: warm, dry Neuro: awake and alert Psych:  Responds to questions appropriately with a normal affect.    Laboratory Data:  ChemistryNo results for input(s): NA, K, CL, CO2, GLUCOSE, BUN, CREATININE, CALCIUM, GFRNONAA, GFRAA, ANIONGAP in the last 168 hours.  No results for input(s): PROT, ALBUMIN, AST, ALT, ALKPHOS, BILITOT in the last 168 hours. HematologyNo results for input(s): WBC, RBC, HGB, HCT, MCV, MCH, MCHC, RDW, PLT in the last 168 hours.  Assessment and Plan   Will proceed w/ TEE  ASA III  Anesthesia to be administered by CRNA  Signed, DEARL LEAVEN, MD 09/15/2024, 11:17 AM

## 2024-09-15 NOTE — Progress Notes (Signed)
 Transesophageal echocardiogram preliminary report  Susan Myers [id number] 1951-05-12  Preliminary diagnosis Symptomatic valvular heart disease  Postprocedural diagnosis same  Time out A timeout was performed by the nursing staff and physicians specifically identifying the procedure performed, identification of the patient, the type of sedation, all allergies and medications, all pertinent medical history, and presedation assessment of nasopharynx.  The patient and or family understand the risks of the procedure including the rare risks of death, stroke, heart attack, esophogeal perforation, sore throat, and reaction to medications given.  General anesthesia CRNA administered GA. See MAR. 200 mg propofol . The patient had continued monitoring of heart rate, oxygenation, blood pressure, respiratory rate, and extent of signs of sedation throughout the entire procedure.  The patient received anesthesia over a period of 20 minutes.  CRNA, nursing staff and I were present during the procedure when the patient was anesthetized for 100% of the time.  Treatment considerations Severe MR, TR  For further details of transesophageal echocardiogram please refer to final report.  Bonney ROCHER [facility] MD [facility] 09/15/2024 12:33 PM

## 2024-09-15 NOTE — Anesthesia Preprocedure Evaluation (Signed)
 Anesthesia Evaluation  Patient identified by MRN, date of birth, ID band Patient awake    Reviewed: Allergy & Precautions, NPO status , Patient's Chart, lab work & pertinent test results  History of Anesthesia Complications (+) PONV and history of anesthetic complications  Airway Mallampati: III  TM Distance: <3 FB Neck ROM: full    Dental  (+) Chipped, Poor Dentition, Missing   Pulmonary neg shortness of breath   Pulmonary exam normal        Cardiovascular Exercise Tolerance: Good hypertension, +CHF  + dysrhythmias Atrial Fibrillation + Valvular Problems/Murmurs  Rhythm:irregular Rate:Tachycardia     Neuro/Psych  PSYCHIATRIC DISORDERS       Neuromuscular disease    GI/Hepatic Neg liver ROS,GERD  Controlled,,  Endo/Other  Hypothyroidism    Renal/GU Renal disease  negative genitourinary   Musculoskeletal   Abdominal   Peds  Hematology negative hematology ROS (+)   Anesthesia Other Findings Past Medical History: 01/10/2019: Actinic keratosis     Comment:  mid vertex scalp 2019: Breast cancer (HCC)     Comment:  invasive mammary carcinoma, lumpectomy and rad tx No date: Complication of anesthesia No date: Delusional disorder (HCC) 06/2016: DVT (deep venous thrombosis) (HCC) 07/24/2020: Dysplastic nevus     Comment:  R med calf - mod No date: GERD (gastroesophageal reflux disease) No date: History of kidney stones No date: Hx of blood clots No date: Hypercholesterolemia No date: Hypertension No date: Hypothyroidism No date: Kidney stones No date: Obesity 2019: Personal history of radiation therapy     Comment:  right breast cancer No date: PONV (postoperative nausea and vomiting) No date: Prediabetes No date: Thyroid  disease  Past Surgical History: No date: ABDOMINAL HYSTERECTOMY No date: APPENDECTOMY 03/23/2018: BREAST BIOPSY; Right     Comment:  Affirm Bx- invasive mammary carcinoma 1990: BREAST  CYST ASPIRATION; Bilateral 2019: BREAST LUMPECTOMY; Right     Comment:  invasive mammary carcinoma, clear surgical margins 08/29/2024: CARDIOVERSION; N/A     Comment:  Procedure: CARDIOVERSION;  Surgeon: Florencio Cara BIRCH,               MD;  Location: [facility];  Service: Cardiovascular;                Laterality: N/A; No date: COLONOSCOPY No date: IVC FILTER INSERTION 08/26/2024: LEFT HEART CATH AND CORONARY ANGIOGRAPHY; N/A     Comment:  Procedure: LEFT HEART CATH AND CORONARY ANGIOGRAPHY with              coronaries and possible PCI and stent;  Surgeon:               Florencio Cara BIRCH, MD;  Location: [facility];               Service: Cardiovascular;  Laterality: N/A; 04/01/2018: PARTIAL MASTECTOMY WITH NEEDLE LOCALIZATION; Right     Comment:  Procedure: PARTIAL MASTECTOMY WITH NEEDLE LOCALIZATION;               Surgeon: Rodolph Romano, MD;  Location: [facility];               Service: General;  Laterality: Right; 08/06/2016: PERIPHERAL VASCULAR CATHETERIZATION; N/A     Comment:  Procedure: IVC Filter Insertion;  Surgeon: Cordella KANDICE Shawl, MD;  Location: [facility];  Service:               Cardiovascular;  Laterality: N/A; 08/06/2016: PERIPHERAL VASCULAR CATHETERIZATION; Left     Comment:  Procedure: Lower Extremity Venography;  Surgeon: Cordella KANDICE Shawl, MD;  Location: [facility];  Service:              Cardiovascular;  Laterality: Left; 08/06/2016: PERIPHERAL VASCULAR CATHETERIZATION     Comment:  Procedure: Lower Extremity Intervention;  Surgeon:               Cordella KANDICE Shawl, MD;  Location: [facility];                Service: Cardiovascular;; 12/16/2016: PERIPHERAL VASCULAR CATHETERIZATION; N/A     Comment:  Procedure: IVC Filter Removal;  Surgeon: Cordella KANDICE Shawl, MD;  Location: [facility];  Service:               Cardiovascular;  Laterality: N/A; 04/01/2018: SENTINEL NODE BIOPSY;  Right     Comment:  Procedure: SENTINEL NODE BIOPSY;  Surgeon: Rodolph Romano, MD;  Location: [facility];  Service: General;                Laterality: Right; No date: TONSILLECTOMY  BMI    Body Mass Index: 33.66 kg/m      Reproductive/Obstetrics negative OB ROS                              Anesthesia Physical Anesthesia Plan  ASA: 3  Anesthesia Plan: General   Post-op Pain Management:    Induction:   PONV Risk Score and Plan: Propofol  infusion and TIVA  Airway Management Planned:   Additional Equipment:   Intra-op Plan:   Post-operative Plan:   Informed Consent: I have reviewed the patients History and Physical, chart, labs and discussed the procedure including the risks, benefits and alternatives for the proposed anesthesia with the patient or authorized representative who has indicated his/her understanding and acceptance.     Dental Advisory Given  Plan Discussed with: Anesthesiologist, CRNA and Surgeon  Anesthesia Plan Comments:         Anesthesia Quick Evaluation

## 2024-09-15 NOTE — Anesthesia Postprocedure Evaluation (Signed)
 Anesthesia Post Note  Patient: Susan Myers  Procedure(s) Performed: ECHOCARDIOGRAM, TRANSESOPHAGEAL  Patient location during evaluation: Specials Recovery Anesthesia Type: General Level of consciousness: awake and alert Pain management: pain level controlled Vital Signs Assessment: post-procedure vital signs reviewed and stable Respiratory status: spontaneous breathing, nonlabored ventilation, respiratory function stable and patient connected to nasal cannula oxygen Cardiovascular status: blood pressure returned to baseline and stable Postop Assessment: no apparent nausea or vomiting Anesthetic complications: no   No notable events documented.   Last Vitals:  Vitals:   09/15/24 1315 09/15/24 1330  BP: 103/77 98/87  Pulse: 91   Resp: 17 17  Temp:    SpO2: 95%     Last Pain:  Vitals:   09/15/24 1330  TempSrc:   PainSc: 0-No pain                 Debby Mines

## 2024-09-16 ENCOUNTER — Encounter: Payer: Self-pay | Admitting: Cardiovascular Disease

## 2024-09-20 DIAGNOSIS — I4891 Unspecified atrial fibrillation: Secondary | ICD-10-CM | POA: Diagnosis not present

## 2024-09-28 DIAGNOSIS — I10 Essential (primary) hypertension: Secondary | ICD-10-CM | POA: Diagnosis not present

## 2024-09-28 DIAGNOSIS — I340 Nonrheumatic mitral (valve) insufficiency: Secondary | ICD-10-CM | POA: Diagnosis not present

## 2024-09-28 DIAGNOSIS — I480 Paroxysmal atrial fibrillation: Secondary | ICD-10-CM | POA: Diagnosis not present

## 2024-09-28 DIAGNOSIS — I5043 Acute on chronic combined systolic (congestive) and diastolic (congestive) heart failure: Secondary | ICD-10-CM | POA: Diagnosis not present

## 2024-10-10 DIAGNOSIS — I890 Lymphedema, not elsewhere classified: Secondary | ICD-10-CM | POA: Diagnosis not present

## 2024-10-10 DIAGNOSIS — I5043 Acute on chronic combined systolic (congestive) and diastolic (congestive) heart failure: Secondary | ICD-10-CM | POA: Diagnosis not present

## 2024-10-10 DIAGNOSIS — I10 Essential (primary) hypertension: Secondary | ICD-10-CM | POA: Diagnosis not present

## 2024-10-10 DIAGNOSIS — K219 Gastro-esophageal reflux disease without esophagitis: Secondary | ICD-10-CM | POA: Diagnosis not present

## 2024-10-10 DIAGNOSIS — I872 Venous insufficiency (chronic) (peripheral): Secondary | ICD-10-CM | POA: Diagnosis not present

## 2024-10-10 DIAGNOSIS — R7303 Prediabetes: Secondary | ICD-10-CM | POA: Diagnosis not present

## 2024-10-10 DIAGNOSIS — I480 Paroxysmal atrial fibrillation: Secondary | ICD-10-CM | POA: Diagnosis not present

## 2024-10-10 DIAGNOSIS — R000 Tachycardia, unspecified: Secondary | ICD-10-CM | POA: Diagnosis not present

## 2024-10-10 DIAGNOSIS — E66812 Obesity, class 2: Secondary | ICD-10-CM | POA: Diagnosis not present

## 2024-10-10 DIAGNOSIS — I340 Nonrheumatic mitral (valve) insufficiency: Secondary | ICD-10-CM | POA: Diagnosis not present

## 2024-10-10 DIAGNOSIS — Z7901 Long term (current) use of anticoagulants: Secondary | ICD-10-CM | POA: Diagnosis not present

## 2024-10-10 DIAGNOSIS — E7800 Pure hypercholesterolemia, unspecified: Secondary | ICD-10-CM | POA: Diagnosis not present

## 2024-10-13 DIAGNOSIS — I4891 Unspecified atrial fibrillation: Secondary | ICD-10-CM | POA: Diagnosis not present

## 2024-10-13 DIAGNOSIS — J90 Pleural effusion, not elsewhere classified: Secondary | ICD-10-CM | POA: Diagnosis not present

## 2024-10-25 DIAGNOSIS — E6609 Other obesity due to excess calories: Secondary | ICD-10-CM | POA: Diagnosis not present

## 2024-10-25 DIAGNOSIS — E7800 Pure hypercholesterolemia, unspecified: Secondary | ICD-10-CM | POA: Diagnosis not present

## 2024-10-25 DIAGNOSIS — I872 Venous insufficiency (chronic) (peripheral): Secondary | ICD-10-CM | POA: Diagnosis not present

## 2024-10-25 DIAGNOSIS — I82501 Chronic embolism and thrombosis of unspecified deep veins of right lower extremity: Secondary | ICD-10-CM | POA: Diagnosis not present

## 2024-10-25 DIAGNOSIS — I340 Nonrheumatic mitral (valve) insufficiency: Secondary | ICD-10-CM | POA: Diagnosis not present

## 2024-10-25 DIAGNOSIS — I5043 Acute on chronic combined systolic (congestive) and diastolic (congestive) heart failure: Secondary | ICD-10-CM | POA: Diagnosis not present

## 2024-10-25 DIAGNOSIS — E039 Hypothyroidism, unspecified: Secondary | ICD-10-CM | POA: Diagnosis not present

## 2024-10-25 DIAGNOSIS — I480 Paroxysmal atrial fibrillation: Secondary | ICD-10-CM | POA: Diagnosis not present

## 2024-10-25 DIAGNOSIS — Z9109 Other allergy status, other than to drugs and biological substances: Secondary | ICD-10-CM | POA: Diagnosis not present

## 2024-10-25 DIAGNOSIS — R7303 Prediabetes: Secondary | ICD-10-CM | POA: Diagnosis not present

## 2024-10-25 DIAGNOSIS — F320 Major depressive disorder, single episode, mild: Secondary | ICD-10-CM | POA: Diagnosis not present

## 2024-10-25 DIAGNOSIS — I10 Essential (primary) hypertension: Secondary | ICD-10-CM | POA: Diagnosis not present

## 2024-10-31 DIAGNOSIS — R000 Tachycardia, unspecified: Secondary | ICD-10-CM | POA: Diagnosis not present

## 2024-10-31 DIAGNOSIS — Z7901 Long term (current) use of anticoagulants: Secondary | ICD-10-CM | POA: Diagnosis not present

## 2024-10-31 DIAGNOSIS — I4891 Unspecified atrial fibrillation: Secondary | ICD-10-CM | POA: Diagnosis not present

## 2024-10-31 DIAGNOSIS — Z01810 Encounter for preprocedural cardiovascular examination: Secondary | ICD-10-CM | POA: Diagnosis not present

## 2024-10-31 DIAGNOSIS — E66812 Obesity, class 2: Secondary | ICD-10-CM | POA: Diagnosis not present

## 2024-10-31 DIAGNOSIS — E278 Other specified disorders of adrenal gland: Secondary | ICD-10-CM | POA: Diagnosis not present

## 2024-11-30 ENCOUNTER — Ambulatory Visit: Admitting: Podiatry

## 2024-11-30 VITALS — Ht 63.0 in | Wt 190.0 lb

## 2024-11-30 DIAGNOSIS — L600 Ingrowing nail: Secondary | ICD-10-CM | POA: Diagnosis not present

## 2024-11-30 NOTE — Progress Notes (Signed)
"  °  Subjective:  Patient ID: Susan Myers, female    DOB: 1951/01/11,  MRN: [id number]  Chief Complaint  Patient presents with   Nail Problem    RM 1 Patient is here for an evaluation of left hallux nail for possible ingrown.    74 y.o. female presents with the above complaint. History confirmed with patient.  She returns to evaluate left great toenail medial border has been red and inflamed for the last week or so  Objective:  Physical Exam: warm, good capillary refill, no trophic changes or ulcerative lesions, normal DP and PT pulses, normal sensory exam, and left hallux valgus deformity on the left hallux medial border distally the medial nail edge has a spike of nail and some erythema around it no active drainage or discomfort no proximal pain.  Assessment:   1. Ingrowing left great toenail      Plan:  Patient was evaluated and treated and all questions answered.  Was able to debride the nail in a slant back fashion to remove the offending border, she had no deep ingrown proximally that necessitated revision partial permanent matricectomy at this point I discussed with her that I would recommend trimming the nail in a angled or curved fashion along the border of the hallux to alleviate pressure here and prevent occurrence of it did recur significantly or get deep proximal pain to return for repeat permanent partial matricectomy and she will follow-up with me as needed  Return if symptoms worsen or fail to improve.   "

## 2025-01-16 ENCOUNTER — Encounter (INDEPENDENT_AMBULATORY_CARE_PROVIDER_SITE_OTHER)

## 2025-01-16 ENCOUNTER — Ambulatory Visit (INDEPENDENT_AMBULATORY_CARE_PROVIDER_SITE_OTHER): Admitting: Vascular Surgery
